# Patient Record
Sex: Female | Born: 1945
Health system: Southern US, Community
[De-identification: ages and names within clinical notes are randomized; demographics above are authoritative.]

## PROBLEM LIST (undated history)

## (undated) DIAGNOSIS — T8859XA Other complications of anesthesia, initial encounter: Secondary | ICD-10-CM

## (undated) DIAGNOSIS — F32A Depression, unspecified: Secondary | ICD-10-CM

## (undated) DIAGNOSIS — K52839 Microscopic colitis, unspecified: Secondary | ICD-10-CM

## (undated) DIAGNOSIS — C801 Malignant (primary) neoplasm, unspecified: Secondary | ICD-10-CM

## (undated) DIAGNOSIS — F329 Major depressive disorder, single episode, unspecified: Secondary | ICD-10-CM

## (undated) DIAGNOSIS — D0502 Lobular carcinoma in situ of left breast: Secondary | ICD-10-CM

## (undated) DIAGNOSIS — F419 Anxiety disorder, unspecified: Secondary | ICD-10-CM

## (undated) DIAGNOSIS — I10 Essential (primary) hypertension: Secondary | ICD-10-CM

## (undated) DIAGNOSIS — T4145XA Adverse effect of unspecified anesthetic, initial encounter: Secondary | ICD-10-CM

## (undated) DIAGNOSIS — M1990 Unspecified osteoarthritis, unspecified site: Secondary | ICD-10-CM

## (undated) DIAGNOSIS — K589 Irritable bowel syndrome without diarrhea: Secondary | ICD-10-CM

## (undated) DIAGNOSIS — E7800 Pure hypercholesterolemia, unspecified: Secondary | ICD-10-CM

## (undated) DIAGNOSIS — C4359 Malignant melanoma of other part of trunk: Secondary | ICD-10-CM

## (undated) DIAGNOSIS — R32 Unspecified urinary incontinence: Secondary | ICD-10-CM

## (undated) HISTORY — DX: Irritable bowel syndrome without diarrhea: K58.9

## (undated) HISTORY — DX: Microscopic colitis, unspecified: K52.839

## (undated) HISTORY — DX: Lobular carcinoma in situ of left breast: D05.02

## (undated) HISTORY — DX: Unspecified urinary incontinence: R32

## (undated) HISTORY — DX: Malignant melanoma of other part of trunk: C43.59

## (undated) HISTORY — PX: TUBAL LIGATION: SHX77

---
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
## 1992-01-14 DIAGNOSIS — C4359 Malignant melanoma of other part of trunk: Secondary | ICD-10-CM

## 1992-01-14 HISTORY — PX: BUNIONECTOMY: SHX129

## 1992-01-14 HISTORY — DX: Malignant melanoma of other part of trunk: C43.59

## 1998-01-13 HISTORY — PX: BREAST LUMPECTOMY: SHX2

## 2000-01-14 HISTORY — PX: ABDOMINAL HYSTERECTOMY: SHX81

## 2000-01-14 HISTORY — PX: OTHER SURGICAL HISTORY: SHX169

## 2001-01-13 HISTORY — PX: KNEE ARTHROSCOPY: SUR90

## 2002-01-13 DIAGNOSIS — C801 Malignant (primary) neoplasm, unspecified: Secondary | ICD-10-CM

## 2002-01-13 HISTORY — DX: Malignant (primary) neoplasm, unspecified: C80.1

## 2002-01-13 HISTORY — PX: BREAST LUMPECTOMY: SHX2

## 2004-01-14 HISTORY — PX: BREAST LUMPECTOMY: SHX2

## 2004-07-11 ENCOUNTER — Ambulatory Visit (HOSPITAL_COMMUNITY): Admission: RE | Admit: 2004-07-11 | Discharge: 2004-07-11 | Payer: Self-pay | Admitting: General Surgery

## 2006-01-13 HISTORY — PX: WRIST SURGERY: SHX841

## 2006-07-22 ENCOUNTER — Ambulatory Visit (HOSPITAL_COMMUNITY): Admission: RE | Admit: 2006-07-22 | Discharge: 2006-07-22 | Payer: Self-pay | Admitting: General Surgery

## 2008-01-14 HISTORY — PX: KNEE ARTHROSCOPY: SUR90

## 2008-01-14 HISTORY — PX: BIOPSY BREAST: PRO8

## 2008-04-26 ENCOUNTER — Ambulatory Visit (HOSPITAL_BASED_OUTPATIENT_CLINIC_OR_DEPARTMENT_OTHER): Admission: RE | Admit: 2008-04-26 | Discharge: 2008-04-26 | Payer: Self-pay | Admitting: Orthopedic Surgery

## 2008-09-08 ENCOUNTER — Encounter: Admission: RE | Admit: 2008-09-08 | Discharge: 2008-09-08 | Payer: Self-pay | Admitting: General Surgery

## 2008-09-08 DIAGNOSIS — D0502 Lobular carcinoma in situ of left breast: Secondary | ICD-10-CM

## 2008-09-08 HISTORY — DX: Lobular carcinoma in situ of left breast: D05.02

## 2009-01-13 HISTORY — PX: EYE SURGERY: SHX253

## 2009-05-03 ENCOUNTER — Ambulatory Visit (HOSPITAL_COMMUNITY): Admission: RE | Admit: 2009-05-03 | Discharge: 2009-05-03 | Payer: Self-pay | Admitting: Ophthalmology

## 2010-01-13 DIAGNOSIS — R32 Unspecified urinary incontinence: Secondary | ICD-10-CM

## 2010-01-13 DIAGNOSIS — E7800 Pure hypercholesterolemia, unspecified: Secondary | ICD-10-CM

## 2010-01-13 HISTORY — DX: Pure hypercholesterolemia, unspecified: E78.00

## 2010-01-13 HISTORY — DX: Unspecified urinary incontinence: R32

## 2010-02-03 ENCOUNTER — Encounter: Payer: Self-pay | Admitting: General Surgery

## 2010-03-14 HISTORY — PX: WRIST SURGERY: SHX841

## 2010-04-02 LAB — BASIC METABOLIC PANEL
Chloride: 109 mEq/L (ref 96–112)
Creatinine, Ser: 0.59 mg/dL (ref 0.4–1.2)
GFR calc non Af Amer: >60 mL/min (ref >60)

## 2010-04-02 LAB — HEMOGLOBIN AND HEMATOCRIT, BLOOD
HCT: 38.1 % (ref 36.0–46.0)
Hemoglobin: 13.6 g/dL (ref 12.0–15.0)

## 2010-04-15 ENCOUNTER — Encounter (HOSPITAL_COMMUNITY): Payer: Medicare Other | Attending: Orthopedic Surgery

## 2010-04-15 ENCOUNTER — Other Ambulatory Visit: Payer: Self-pay | Admitting: Orthopedic Surgery

## 2010-04-15 DIAGNOSIS — Z01812 Encounter for preprocedural laboratory examination: Secondary | ICD-10-CM | POA: Insufficient documentation

## 2010-04-15 DIAGNOSIS — Z79899 Other long term (current) drug therapy: Secondary | ICD-10-CM | POA: Insufficient documentation

## 2010-04-15 LAB — URINALYSIS, ROUTINE W REFLEX MICROSCOPIC
Bilirubin Urine: NEGATIVE
Glucose, UA: NEGATIVE mg/dL
Hgb urine dipstick: NEGATIVE
Protein, ur: NEGATIVE mg/dL
Urobilinogen, UA: 0.2 mg/dL (ref 0.0–1.0)
pH: 5.5 (ref 5.0–8.0)

## 2010-04-15 LAB — COMPREHENSIVE METABOLIC PANEL
ALT: 16 U/L (ref 0–35)
AST: 14 U/L (ref 0–37)
BUN: 20 mg/dL (ref 6–23)
Calcium: 9.5 mg/dL (ref 8.4–10.5)
Chloride: 108 mEq/L (ref 96–112)
Creatinine, Ser: 0.65 mg/dL (ref 0.4–1.2)
Glucose, Bld: 99 mg/dL (ref 70–99)
Sodium: 142 mEq/L (ref 135–145)
Total Bilirubin: 0.5 mg/dL (ref 0.3–1.2)

## 2010-04-15 LAB — PROTIME-INR
INR: 0.91 (ref 0.00–1.49)
Prothrombin Time: 12.5 seconds (ref 11.6–15.2)

## 2010-04-15 LAB — URINE MICROSCOPIC-ADD ON

## 2010-04-15 LAB — CBC
HCT: 41.8 % (ref 36.0–46.0)
Hemoglobin: 13.8 g/dL (ref 12.0–15.0)
MCH: 31.4 pg (ref 26.0–34.0)
MCHC: 33 g/dL (ref 30.0–36.0)

## 2010-04-15 LAB — APTT: aPTT: 28 seconds (ref 24–37)

## 2010-04-22 ENCOUNTER — Inpatient Hospital Stay (HOSPITAL_COMMUNITY): Admission: RE | Admit: 2010-04-22 | Payer: Medicare Other | Source: Ambulatory Visit | Admitting: Orthopedic Surgery

## 2010-04-26 NOTE — H&P (Signed)
NAME:  Sylvia Barnett, Sylvia Barnett            ACCOUNT NO.:  [id number]  MEDICAL RECORD NO.:  [id number]           PATIENT TYPE:  I  LOCATION:  1S                           FACILITY:  [facility]  PHYSICIAN:  Ollen Gross, M.D.    DATE OF BIRTH:  [birth year]  DATE OF ADMISSION:  04/22/2010 DATE OF DISCHARGE:                             HISTORY & PHYSICAL   CHIEF COMPLAINT:  Left greater than right knee pain.  HISTORY OF PRESENT ILLNESS:  The patient is a 65 year old female who has been seen by Dr. Lequita Halt for ongoing knee pain.  She has bilateral knee pain, but the left is more symptomatic and problematic than the right. It has been ongoing for quite some time now.  She has known end-stage arthritis and felt to be a good candidate.  She has been seen preoperatively by Dr. Neita Carp and felt to be stable for the up and coming procedure.  ALLERGIES:  SULFA causes rash.  INTOLERANCES:  BAND-AIDS cause skin irritation, but the patient does not have a known latex allergy.  CURRENT MEDICATIONS:  Atorvastatin, Lipitor, Celexa.  PAST MEDICAL HISTORY: 1. Anxiety. 2. Depression. 3. Hypercholesterolemia. 4. Hemorrhoids. 5. Urinary incontinence. 6. History of urinary tract infections. 7. History of cystitis. 8. History of left-sided in situ breast carcinoma. 9. Degenerative disk disease. 10.Postmenopausal. 11.Childhood illnesses of measles and mumps.  PAST SURGICAL HISTORY: 1. Appendectomy in 1949. 2. Tubal ligation in 1985. 3. Bunion surgery in 1994. 4. Knee surgery in 2003. 5. Colonoscopy with polypectomy in 2002. 6. Left-sided breast cancer surgery in 2000. 7. Bladder tack hysterectomy in 2002. 8. Colonoscopy with polypectomy in 2002. 9. Right leg biopsy in 2010. 10.Right eye cataract surgery in 2011. 11.Left knee surgery in 2010.  FAMILY HISTORY:  Father with cerebrovascular disease.  Mother with lymphoma.  Brother with melanoma.  Sister with lymphoma.  SOCIAL HISTORY:  Married.   Retired.  Nonsmoker.  No alcohol.  Husband will be assisting with care after surgery.  She has three steps entering the front of her house and zero steps entering in from the carport of the house.  REVIEW OF SYSTEMS:  GENERAL:  No fevers, chills or night sweats. NEUROLOGIC:  No seizures, syncope or paralysis.  RESPIRATORY:  No shortness of breath, productive cough or hemoptysis.  CARDIOVASCULAR: No chest pain.  No orthopnea.  GASTROINTESTINAL:  No nausea, vomiting, diarrhea or constipation.  GENITOURINARY:  A little bit of frequency and incontinence.  No dysuria or hematuria.  MUSCULOSKELETAL:  Knee pain.  PHYSICAL EXAMINATION:  VITAL SIGNS:  Pulse 68, respirations 14, blood pressure 160/82. GENERAL:  A 65 year old white female, well-nourished, well-developed. No acute distress.  She is slightly overweight.  Alert, oriented and cooperative. HEENT:  Normocephalic, atraumatic.  Pupils are equal, round and reactive.  EOMs intact. NECK:  Supple. CHEST:  Clear. HEART:  Regular rate and rhythm without murmur.  S1 and S2 noted. ABDOMEN:  Soft, round.  Bowel sounds are present. RECTAL/BREAST/GENITALIA:  Not done as not pertinent to present illness. EXTREMITIES:  Left knee range of motion 7-110.  Marked crepitus.  Tender more medial.  No instability.  Right knee range of  motion 15-120.  No instability.  Tender medial joint line.  IMPRESSION:  Osteoarthritis left knee greater than right knee.  PLAN:  The patient was admitted to [facility] to undergo a left total-knee replacement arthroplasty.  Surgery will be performed by Dr. Ollen Gross.     Alexzandrew L. Julien Girt, P.A.C.   ______________________________ Ollen Gross, M.D.    ALP/MEDQ  D:  04/22/2010  T:  04/22/2010  Job:  [id number]  cc:   Fara Chute Fax: [phone]  Electronically Signed by Patrica Duel P.A.C. on 04/25/2010 11:12:41 AM Electronically Signed by Ollen Gross M.D. on 04/26/2010 09:02:38 AM

## 2010-05-28 NOTE — Op Note (Signed)
NAME:  Sylvia Barnett, Sylvia Barnett            ACCOUNT NO.:  [id number]   MEDICAL RECORD NO.:  [id number]          PATIENT TYPE:  AMB   LOCATION:  NESC                         FACILITY:  [facility]   PHYSICIAN:  Ollen Gross, M.D.    DATE OF BIRTH:  December 20, 1945   DATE OF PROCEDURE:  04/26/2008  DATE OF DISCHARGE:                               OPERATIVE REPORT   PREOPERATIVE DIAGNOSIS:  Left knee lateral meniscal tear.   POSTOPERATIVE DIAGNOSIS:  Left knee lateral meniscal tear.  Medial  meniscal tear and chondral defect.   PROCEDURE:  Left knee arthroscopy with medial and lateral meniscal  debridement and chondroplasty.   SURGEON:  Ollen Gross, M.D.   ASSISTANT:  None.   ANESTHESIA:  General.   ESTIMATED BLOOD LOSS:  Minimal.   DRAINS:  None.   COMPLICATIONS:  None.   CONDITION:  Stable to recovery.   CLINICAL NOTE:  Sylvia Barnett is a 65 year old female with history of  significant left-knee pain and mechanical symptoms.  She had an exam and  history suggestive of meniscal tear.  It was equivocal on MRI.  Given  her persistent symptoms and lack of response to nonoperative  management, it was decided to proceed with arthroscopic debridement.   PROCEDURE IN DETAIL:  After successful administration of general  anesthetic, a tourniquet was placed high on her left thigh and her left  lower extremity was prepped and draped in the usual sterile fashion.  Standard superomedial and inferolateral incisions were made.  Inflow  cannula passed superomedial and camera passed inferolateral.  Arthroscopic visualization proceeds.   Undersurface of patella shows some grade 2 change and the trochlea looks  pretty normal.  Medial and lateral gutters were visualized and there is  no loose body.  Flexion and valgus force applied to the knee and the  medial compartment was entered.  There was a tear in the body and  posterior horn of the medial meniscus and there was also some high-grade  chondromalacia  in the medial femoral condyle.   A spinal needle was used to localize the inferomedial portal, a small  incision made and dilator placed.  The meniscus was debrided back to a  stable base with baskets and a 4.2-mm shaver.  It was sealed off with  the ArthroCare device.  The meniscus was probed and found to be stable.  The cartilage abnormality and surface of the  medial femoral condyle  consisted of about a 5 x 5-mm area of exposed bone and then about a 1 x  1-cm area of grade 3 chondromalacia.  I debrided this back to a stable  cartilaginous base.  There was no other unstable cartilage in the medial  compartment.   The intercondylar notch was visualized.  The ACL and PCL looked normal.  Lateral compartment was entered.  There was a lateral meniscal tear.  It  was debrided back to stable base with baskets and a 4.2-mm shaver.  It  was sealed off with the ArthroCare device.   The joint was further inspected and I saw a very large loose  osteochondral body in the infrapatellar area.  I was able to grasp this  with the pituitary grabber and remove it.  It was a fairly large loose  body and can definitely have been impinging.   The arthroscopic equipment was then removed from the inferior portals  which were closed with interrupted 4-0 nylon.  20 mL of 0.25% Marcaine  with epi were injected through the inflow cannula, then that was removed  that portal closed with nylon.  A bulky sterile dressing was then  applied and the patient was awakened and transported to recovery in  stable condition.      Ollen Gross, M.D.  Electronically Signed     FA/MEDQ  D:  04/26/2008  T:  04/26/2008  Job:  [id number]

## 2010-08-14 HISTORY — PX: JOINT REPLACEMENT: SHX530

## 2010-08-20 ENCOUNTER — Other Ambulatory Visit: Payer: Self-pay | Admitting: Orthopedic Surgery

## 2010-08-20 ENCOUNTER — Encounter (HOSPITAL_COMMUNITY): Payer: Medicare Other

## 2010-08-20 LAB — URINALYSIS, ROUTINE W REFLEX MICROSCOPIC
Bilirubin Urine: NEGATIVE
Glucose, UA: NEGATIVE mg/dL
Hgb urine dipstick: NEGATIVE
Ketones, ur: NEGATIVE mg/dL
Nitrite: POSITIVE — AB
Protein, ur: NEGATIVE mg/dL
Specific Gravity, Urine: 1.025 (ref 1.005–1.030)
Urobilinogen, UA: 0.2 mg/dL (ref 0.0–1.0)
pH: 6 (ref 5.0–8.0)

## 2010-08-20 LAB — COMPREHENSIVE METABOLIC PANEL
AST: 14 U/L (ref 0–37)
BUN: 24 mg/dL — ABNORMAL HIGH (ref 6–23)
Chloride: 108 mEq/L (ref 96–112)
Creatinine, Ser: 0.59 mg/dL (ref 0.50–1.10)
GFR calc Af Amer: >60 mL/min (ref >60)
GFR calc non Af Amer: >60 mL/min (ref >60)
Glucose, Bld: 92 mg/dL (ref 70–99)
Potassium: 3.9 mEq/L (ref 3.5–5.1)
Sodium: 143 mEq/L (ref 135–145)
Total Bilirubin: 0.2 mg/dL — ABNORMAL LOW (ref 0.3–1.2)

## 2010-08-20 LAB — CBC
HCT: 40.2 % (ref 36.0–46.0)
MCH: 31 pg (ref 26.0–34.0)

## 2010-08-20 LAB — SURGICAL PCR SCREEN: MRSA, PCR: NEGATIVE

## 2010-09-02 ENCOUNTER — Inpatient Hospital Stay (HOSPITAL_COMMUNITY)
Admission: RE | Admit: 2010-09-02 | Discharge: 2010-09-05 | DRG: 470 | Disposition: A | Discharge status: Home Health | Payer: Medicare Other | Source: Ambulatory Visit | Attending: Orthopedic Surgery | Admitting: Orthopedic Surgery

## 2010-09-02 DIAGNOSIS — F341 Dysthymic disorder: Secondary | ICD-10-CM | POA: Diagnosis present

## 2010-09-02 DIAGNOSIS — M1710 Unilateral primary osteoarthritis, unspecified knee: Principal | ICD-10-CM | POA: Diagnosis present

## 2010-09-02 DIAGNOSIS — R110 Nausea: Secondary | ICD-10-CM | POA: Diagnosis not present

## 2010-09-02 DIAGNOSIS — Z01812 Encounter for preprocedural laboratory examination: Secondary | ICD-10-CM

## 2010-09-02 LAB — TYPE AND SCREEN: ABO/RH(D): AB POS

## 2010-09-02 LAB — ABO/RH: ABO/RH(D): AB POS

## 2010-09-03 LAB — CBC
HCT: 34.6 % — ABNORMAL LOW (ref 36.0–46.0)
MCH: 30.9 pg (ref 26.0–34.0)
MCHC: 32.9 g/dL (ref 30.0–36.0)
Platelets: 170 10*3/uL (ref 150–400)
RDW: 12.7 % (ref 11.5–15.5)
WBC: 8.2 10*3/uL (ref 4.0–10.5)

## 2010-09-03 LAB — BASIC METABOLIC PANEL
BUN: 7 mg/dL (ref 6–23)
Calcium: 8.8 mg/dL (ref 8.4–10.5)
Chloride: 101 mEq/L (ref 96–112)
Creatinine, Ser: 0.47 mg/dL — ABNORMAL LOW (ref 0.50–1.10)
GFR calc Af Amer: >60 mL/min (ref >60)
GFR calc non Af Amer: >60 mL/min (ref >60)
Sodium: 136 mEq/L (ref 135–145)

## 2010-09-04 LAB — BASIC METABOLIC PANEL
BUN: 5 mg/dL — ABNORMAL LOW (ref 6–23)
Creatinine, Ser: 0.48 mg/dL — ABNORMAL LOW (ref 0.50–1.10)
GFR calc Af Amer: >60 mL/min (ref >60)
GFR calc non Af Amer: >60 mL/min (ref >60)
Potassium: 3.7 mEq/L (ref 3.5–5.1)
Sodium: 140 mEq/L (ref 135–145)

## 2010-09-04 LAB — CBC
MCH: 31.5 pg (ref 26.0–34.0)
MCHC: 33.4 g/dL (ref 30.0–36.0)
Platelets: 186 10*3/uL (ref 150–400)
RDW: 12.8 % (ref 11.5–15.5)
WBC: 9.4 10*3/uL (ref 4.0–10.5)

## 2010-09-05 LAB — CBC
HCT: 33.3 % — ABNORMAL LOW (ref 36.0–46.0)
Hemoglobin: 11.1 g/dL — ABNORMAL LOW (ref 12.0–15.0)
MCH: 31.8 pg (ref 26.0–34.0)
RBC: 3.49 MIL/uL — ABNORMAL LOW (ref 3.87–5.11)
WBC: 10.1 10*3/uL (ref 4.0–10.5)

## 2010-09-12 NOTE — Op Note (Signed)
NAME:  Sylvia Barnett, Sylvia Barnett NO.:  [id number]  MEDICAL RECORD NO.:  [id number]  LOCATION:  1620                         FACILITY:  [facility]  PHYSICIAN:  Ollen Gross, M.D.    DATE OF BIRTH:  07/07/1945  DATE OF PROCEDURE:  09/02/2010 DATE OF DISCHARGE:                              OPERATIVE REPORT   PREOPERATIVE DIAGNOSIS:  Osteoarthritis, left knee.  POSTOPERATIVE DIAGNOSIS:  Osteoarthritis, left knee.  PROCEDURE:  Left total knee arthroplasty.  SURGEON:  Ollen Gross, MD  ASSISTANT:  Alexzandrew L. Perkins, PA-C  ANESTHESIA:  Spinal.  ESTIMATED BLOOD LOSS:  Minimal.  DRAIN:  Hemovac x1.  TOURNIQUET TIME:  32 minutes at 300 mmHg.  COMPLICATIONS:  None.  CONDITION:  Stable to Recovery.  BRIEF CLINICAL NOTE:  Sylvia Barnett is a 65 year old female with advanced end-stage arthritis both knees, left worse than the right.  She has bone on bone degenerative change.  She has failed nonoperative management, including injections and analgesics.  She presents now for left total knee arthroplasty.  PROCEDURE IN DETAIL:  After successful administration of spinal anesthetic, a tourniquet was placed high on her left thigh, and her left lower extremity was prepped and draped in usual sterile fashion. Extremity was wrapped in an Esmarch, knee flexed, tourniquet inflated to 300 mmHg.  Midline incision was made with a 10 blade through subcutaneous tissue to the level of the extensor mechanism.  Fresh blade was used to make a medial parapatellar arthrotomy.  Soft tissue on the proximal medial tibia subperiosteally elevated to the joint line with the knife and into the semimembranosus bursa with a Cobb elevator.  Soft tissue laterally was elevated with attention being paid to avoid patellar tendon on tibial tubercle.  Patella was everted, knee flexed 90 degrees, and ACL and PCL removed.  She has complete exposed bone on bone of the medial and patellofemoral compartments.   Drill was used to create a starting hole in the distal femur.  Canal was thoroughly irrigated.  A 5-degree left valgus alignment guide was placed.  I resected 12 mm off the distal femur because she had about a 15-degree flexion contracture preop.  Distal femoral resection was made with an oscillating saw.  The tibia subluxed forward and the menisci removed.  Extramedullary tibial alignment guide was placed referencing proximally at the medial aspect of the tibial tubercle and distally along the second metatarsal axis and tibial crest.  The block was pinned to remove 2 mm off the more deficient medial side.  Tibial resection was made with an oscillating saw.  Size 3 was the most appropriate tibial component and the proximal tibia was prepared with a modular drill and keel punched for the size 3.  Femoral sizing guide was placed, size 4 narrow was most appropriate. Rotation was marked off the epicondylar axis.  This was confirmed by creating rectangular flexion gap at 90 degrees.  The block was pinned in this rotation and the anterior and posterior chamfer cuts made. Intercondylar block was placed and that cut was made.  Trial size 4 narrow posterior stabilized femur was placed.  A 10 mm posterior stabilized rotating platform insert trial was placed.  There was a little bit  of varus-valgus play with that.  I went to a 12.5 which allowed for full extension with excellent varus-valgus, anterior- posterior stability throughout, full range of motion.  Patella was everted, thickness measured to be 24 mm.  Freehand resection was taken to 14 mm, 38 template was placed, lug holes were drilled, trial patella was placed, and it tracked normally.  Osteophytes removed off the posterior femur with the trial in place.  All trials were removed and the cut bone surfaces repaired with pulsatile lavage.  Cement was mixed and once ready for implantation, the size 3 mobile-bearing tibial tray, size 4 narrow  posterior stabilized femur, and 38 patella were cemented into place and the patella was held with a clamp.  Trial 12.5 mm insert was placed, knee held in full extension, all extruded cement removed. When the cement was fully hardened, then the permanent 12.5 mm posterior stabilized rotating platform insert was placed into the tibial tray. Wound was copiously irrigated with saline solution and the arthrotomy closed over Hemovac drain with interrupted #1 PDS.  Flexion against gravity was 125 degrees.  Patella tracked normally.  She hits her calf to her posterior thigh at 125.  Tourniquet was released total time of 32 minutes.  Subcutaneous was then closed with interrupted 2-0 Vicryl and subcuticular with running 4-0 Monocryl.  Catheter for the Marcaine pain pump was placed and the pump was initiated.  Incision was cleaned and dried and Steri-Strips and a bulky sterile dressing were applied.  She was then placed into a knee immobilizer, awakened, and transported to Recovery in stable condition.     Ollen Gross, M.D.     FA/MEDQ  D:  09/02/2010  T:  09/02/2010  Job:  [id number]  Electronically Signed by Ollen Gross M.D. on 09/12/2010 04:03:38 PM

## 2010-09-18 NOTE — H&P (Addendum)
NAME:  Sylvia Barnett, RAMBEAU            ACCOUNT NO.:  [id number]  MEDICAL RECORD NO.:  [id number]  LOCATION:  PADM                         FACILITY:  [facility]  PHYSICIAN:  Ollen Gross, M.D.    DATE OF BIRTH:  05-07-1945  DATE OF ADMISSION:  09/02/2010 DATE OF DISCHARGE:  09/05/2010                             HISTORY & PHYSICAL   DATE OF ADMISSION:  09/02/2010.  CHIEF COMPLAINT:  Left knee pain.  HISTORY OF PRESENT ILLNESS:  The patient is a 65 year old female who has been seen by Dr. Lequita Halt for ongoing right knee pain.  She has known end- stage arthritis  in both knees and she has undergone injections in the past.  Despite having nonoperative treatment, including injections, the patient continues to have progressive pain.  She has end-stage arthritis, failed nonoperative management including injections, and now presents for total knee arthroplasty.  ALLERGIES: 1. SULFA causes rash. 2. PRILOSEC causes swelling in fingers. 3. Intolerances to Band-Aids which cause skin irritation, but the     patient does not have a known latex allergy. 4. FERNIAL caused her rash.  CURRENT MEDICATIONS:  Lipitor, Celexa, Os-Cal with D, multivitamin.  PAST MEDICAL HISTORY:  Anxiety, depression, hypercholesterolemia, hemorrhoids, urinary incontinence, history of cystitis, history of left- sided in situ breast carcinoma, degenerative disk disease, postmenopausal.  Childhood illnesses of measles and mumps, history of melanoma.  PAST SURGICAL HISTORY:  Appendectomy 1949, tubal ligation 1985, bunion surgery 1994, knee surgery 2003, colonoscopy, polypectomy in 2002, left- sided breast cancer surgery 2000, right leg biopsy 2010, right eye cataract surgery 2011, and left knee surgery 2010.  FAMILY HISTORY:  Father deceased at age 28 with diabetes and heart disease.  Mother deceased at age 25 with lymphoma.  Has a sister with breast cancer, another sister with lymphoma.  A brother with  skin melanoma.  SOCIAL HISTORY:  Married, retired, nonsmoker.  Two children, one daughter and one son.  Husband will be assisting with care after surgery.  She lives in a split-level.  She has 7 steps going up to the upper level and 10 steps going down to the lower level.  REVIEW OF SYSTEMS:  GENERAL:  No fevers, chills or night sweats. NEUROLOGIC:  No seizures, syncope or paralysis.  RESPIRATORY:  No shortness breath, productive cough or hemoptysis.  CARDIOVASCULAR:  No chest pain or orthopnea.  GI:  No nausea, vomiting, diarrhea or constipation.  GU:  Little bit of urinary frequency.  No dysuria or hematuria.  MUSCULOSKELETAL:  Knee pain.  PHYSICAL EXAMINATION:  VITAL SIGNS:  Pulse 72, respirations 14, blood pressure 168/81. GENERAL:  65 year old white female, well-nourished, well-developed, in no acute distress.  She is alert, oriented and cooperative.  Slightly overweight, accompanied by her husband. HEENT:  Normocephalic, atraumatic.  Pupils round and reactive.  EOMs intact. Wears glasses. NECK:  Supple. CHEST:  Clear. HEART:  Regular rate and rhythm without murmur. ABDOMEN:  Soft, nontender, round.  Bowel sounds present. RECTAL, BREASTS, GENITALIA:  Not done.  Not pertinent to present illness. EXTREMITIES:  Left knee tender medially.  Range of motion 7 to 110, marked crepitus, no instability.  IMPRESSION:  Osteoarthritis, left knee.  PLAN:  The patient will be admitted to  [facility], undergo a left total knee replacement arthroplasty.  Surgery will be performed by Dr. Ollen Gross.  There are no active contraindications to surgery such as an infection or rapidly progressive neurological disease.  Dictated For:  Ollen Gross, MD     Alexzandrew L. Julien Girt, P.A.C.   ______________________________ Ollen Gross, M.D.    ALP/MEDQ  D:  09/08/2010  T:  09/08/2010  Job:  [id number]  cc:   Fara Chute, MD Fax: [phone]  Electronically Signed by  Patrica Duel P.A.C. on 09/18/2010 12:39:13 PM Electronically Signed by Ollen Gross M.D. on 09/22/2010 12:16:43 PM

## 2010-09-22 NOTE — Discharge Summary (Signed)
NAME:  Sylvia Barnett, CAREY NO.:  [id number]  MEDICAL RECORD NO.:  [id number]  LOCATION:  1620                         FACILITY:  [facility]  PHYSICIAN:  Ollen Gross, M.D.    DATE OF BIRTH:  Jan 28, 1945  DATE OF ADMISSION:  09/02/2010 DATE OF DISCHARGE:  09/05/2010                              DISCHARGE SUMMARY   ADMITTING DIAGNOSES: 1. Osteoarthritis, left knee. 2. Anxiety. 3. Depression. 4. Hypercholesterolemia. 5. Hemorrhoids. 6. Urinary incontinence. 7. History of cystitis. 8. History of the left-sided in situ breast cancer. 9. Degenerative disk disease. 10.Postmenopause. 11.History of melanoma. 12.Childhood illnesses of measles and mumps.  DISCHARGE DIAGNOSES: 1. Osteoarthritis, left knee status; post left total knee replacement     arthroplasty. 2. Anxiety. 3. Depression. 4. Hypercholesterolemia. 5. Hemorrhoids. 6. Urinary incontinence. 7. History of cystitis. 8. History of the left-sided in situ breast cancer. 9. Degenerative disk disease. 10.Postmenopause. 11.History of melanoma. 12.Childhood illnesses of measles and mumps.  PROCEDURE:  Left total knee.  SURGEON:  Ollen Gross, M.D.  ASSISTANT:  Alexzandrew L. Perkins, P.A.C.  ANESTHESIA:  Spinal.  TOURNIQUET TIME:  32 minutes.  CONSULTS:  None.  BRIEF HISTORY:  Ms. Sylvia Barnett is a 65 year old female with advanced endstage arthritis of both knees, left is more symptomatic and problematic than right.  She has bone-on-bone changes, failed nonoperative management including injections and analgesics, now presents for surgical intervention.  LABORATORY DATA:  The admission CBC is not scanned into the computer chart but her starting hemoglobin was 13.2.  Serial CBCs were followed. Hemoglobin dropped down at 11.4, then 10.4 back up, and last H and H were 11.1 and 33.3.  Admission chemistry panel was not scanned into the computer chart.  Serial BMETs were followed for 48 hours.   Electrolytes remained within normal limits.  Glucose was 154 on postop day #1 and back down to 139 on postop day #2.  AB positive was her blood group type.  HOSPITAL COURSE:  The patient admitted to [facility], taken to OR, underwent above-stated procedure without complication.  The patient tolerated the procedure well, later was transferred to recovery room on to orthopedic floor, started on p.o. and IV analgesic pain control following surgery, given 24 hours postop IV antibiotics.  She was started on Xarelto for DVT prophylaxis, started getting up out of bed on day #1, had a little bit of nausea on the evening of surgery that was better on postop day #1.  Hemovac drain was pulled.  She was started back on her home meds.  She had good urinary output on postop day #1. By day #2, we changed her dressing, incision looked good.  The Foley was removed.  Hemoglobin was stable.  She had a little bit elevated temperature on the evening of day #1 till the morning of day #2, but we encouraged incentive spirometer and antipyretics.  Temperature came down.  Continued to work with her therapy and by day #3, she was doing better.  She was meeting her goals.  She had finished with occupational therapy and physical therapy, and she was discharged home.  DISCHARGE/PLAN: 1. The patient was discharged home on September 05, 2010. 2. Discharge diagnoses, please see above. 3.  Discharge meds; OxyIR, Robaxin, Xarelto, continue home meds,     arthritis topical cream/Aspercreme, Celexa, Lipitor, and     multivitamin.  DIET:  Heart-healthy diet.  ACTIVITY:  She is weightbearing as tolerated.  Total knee protocol. Home health PT.  FOLLOWUP:  2 weeks.  DISPOSITION:  Home.  CONDITION UPON DISCHARGE:  Improved.     Alexzandrew L. Julien Girt, P.A.C.   ______________________________ Ollen Gross, M.D.    ALP/MEDQ  D:  09/18/2010  T:  09/18/2010  Job:  [id number]  cc:   Fara Chute, MD Fax:  [phone]  Electronically Signed by Patrica Duel P.A.C. on 09/19/2010 08:14:40 AM Electronically Signed by Ollen Gross M.D. on 09/22/2010 12:16:46 PM

## 2011-01-14 DIAGNOSIS — I10 Essential (primary) hypertension: Secondary | ICD-10-CM

## 2011-01-14 DIAGNOSIS — M1990 Unspecified osteoarthritis, unspecified site: Secondary | ICD-10-CM

## 2011-01-14 HISTORY — DX: Essential (primary) hypertension: I10

## 2011-01-14 HISTORY — DX: Unspecified osteoarthritis, unspecified site: M19.90

## 2011-01-23 DIAGNOSIS — J069 Acute upper respiratory infection, unspecified: Secondary | ICD-10-CM | POA: Diagnosis not present

## 2011-02-05 DIAGNOSIS — M47817 Spondylosis without myelopathy or radiculopathy, lumbosacral region: Secondary | ICD-10-CM | POA: Diagnosis not present

## 2011-02-06 DIAGNOSIS — N3946 Mixed incontinence: Secondary | ICD-10-CM | POA: Diagnosis not present

## 2011-02-06 DIAGNOSIS — R350 Frequency of micturition: Secondary | ICD-10-CM | POA: Diagnosis not present

## 2011-02-06 DIAGNOSIS — N3941 Urge incontinence: Secondary | ICD-10-CM | POA: Diagnosis not present

## 2011-02-13 DIAGNOSIS — R350 Frequency of micturition: Secondary | ICD-10-CM | POA: Diagnosis not present

## 2011-02-13 DIAGNOSIS — N3941 Urge incontinence: Secondary | ICD-10-CM | POA: Diagnosis not present

## 2011-02-17 DIAGNOSIS — M47817 Spondylosis without myelopathy or radiculopathy, lumbosacral region: Secondary | ICD-10-CM | POA: Diagnosis not present

## 2011-02-20 DIAGNOSIS — R350 Frequency of micturition: Secondary | ICD-10-CM | POA: Diagnosis not present

## 2011-02-20 DIAGNOSIS — N3941 Urge incontinence: Secondary | ICD-10-CM | POA: Diagnosis not present

## 2011-02-27 DIAGNOSIS — N3941 Urge incontinence: Secondary | ICD-10-CM | POA: Diagnosis not present

## 2011-02-27 DIAGNOSIS — R350 Frequency of micturition: Secondary | ICD-10-CM | POA: Diagnosis not present

## 2011-03-04 DIAGNOSIS — M5137 Other intervertebral disc degeneration, lumbosacral region: Secondary | ICD-10-CM | POA: Diagnosis not present

## 2011-03-06 DIAGNOSIS — R350 Frequency of micturition: Secondary | ICD-10-CM | POA: Diagnosis not present

## 2011-03-06 DIAGNOSIS — N3941 Urge incontinence: Secondary | ICD-10-CM | POA: Diagnosis not present

## 2011-03-11 DIAGNOSIS — M47817 Spondylosis without myelopathy or radiculopathy, lumbosacral region: Secondary | ICD-10-CM | POA: Diagnosis not present

## 2011-03-13 DIAGNOSIS — N3946 Mixed incontinence: Secondary | ICD-10-CM | POA: Diagnosis not present

## 2011-03-13 DIAGNOSIS — R350 Frequency of micturition: Secondary | ICD-10-CM | POA: Diagnosis not present

## 2011-03-13 DIAGNOSIS — N3941 Urge incontinence: Secondary | ICD-10-CM | POA: Diagnosis not present

## 2011-03-17 DIAGNOSIS — I10 Essential (primary) hypertension: Secondary | ICD-10-CM | POA: Diagnosis not present

## 2011-03-17 DIAGNOSIS — E7800 Pure hypercholesterolemia, unspecified: Secondary | ICD-10-CM | POA: Diagnosis not present

## 2011-03-18 DIAGNOSIS — M1710 Unilateral primary osteoarthritis, unspecified knee: Secondary | ICD-10-CM | POA: Diagnosis not present

## 2011-03-18 DIAGNOSIS — M76899 Other specified enthesopathies of unspecified lower limb, excluding foot: Secondary | ICD-10-CM | POA: Diagnosis not present

## 2011-03-20 DIAGNOSIS — N3941 Urge incontinence: Secondary | ICD-10-CM | POA: Diagnosis not present

## 2011-03-24 DIAGNOSIS — E7800 Pure hypercholesterolemia, unspecified: Secondary | ICD-10-CM | POA: Diagnosis not present

## 2011-03-24 DIAGNOSIS — Z0000 Encounter for general adult medical examination without abnormal findings: Secondary | ICD-10-CM | POA: Diagnosis not present

## 2011-03-24 DIAGNOSIS — I10 Essential (primary) hypertension: Secondary | ICD-10-CM | POA: Diagnosis not present

## 2011-03-24 DIAGNOSIS — F411 Generalized anxiety disorder: Secondary | ICD-10-CM | POA: Diagnosis not present

## 2011-03-24 DIAGNOSIS — M1990 Unspecified osteoarthritis, unspecified site: Secondary | ICD-10-CM | POA: Diagnosis not present

## 2011-03-24 DIAGNOSIS — IMO0002 Reserved for concepts with insufficient information to code with codable children: Secondary | ICD-10-CM | POA: Diagnosis not present

## 2011-03-27 DIAGNOSIS — N3941 Urge incontinence: Secondary | ICD-10-CM | POA: Diagnosis not present

## 2011-03-27 DIAGNOSIS — R350 Frequency of micturition: Secondary | ICD-10-CM | POA: Diagnosis not present

## 2011-03-31 DIAGNOSIS — M5137 Other intervertebral disc degeneration, lumbosacral region: Secondary | ICD-10-CM | POA: Diagnosis not present

## 2011-03-31 DIAGNOSIS — M5030 Other cervical disc degeneration, unspecified cervical region: Secondary | ICD-10-CM | POA: Diagnosis not present

## 2011-04-03 DIAGNOSIS — R350 Frequency of micturition: Secondary | ICD-10-CM | POA: Diagnosis not present

## 2011-04-03 DIAGNOSIS — N3941 Urge incontinence: Secondary | ICD-10-CM | POA: Diagnosis not present

## 2011-04-10 DIAGNOSIS — N3941 Urge incontinence: Secondary | ICD-10-CM | POA: Diagnosis not present

## 2011-04-10 DIAGNOSIS — R350 Frequency of micturition: Secondary | ICD-10-CM | POA: Diagnosis not present

## 2011-04-17 DIAGNOSIS — R350 Frequency of micturition: Secondary | ICD-10-CM | POA: Diagnosis not present

## 2011-04-17 DIAGNOSIS — N3941 Urge incontinence: Secondary | ICD-10-CM | POA: Diagnosis not present

## 2011-04-24 DIAGNOSIS — N3941 Urge incontinence: Secondary | ICD-10-CM | POA: Diagnosis not present

## 2011-04-24 DIAGNOSIS — R350 Frequency of micturition: Secondary | ICD-10-CM | POA: Diagnosis not present

## 2011-05-01 DIAGNOSIS — N3941 Urge incontinence: Secondary | ICD-10-CM | POA: Diagnosis not present

## 2011-05-01 DIAGNOSIS — N3946 Mixed incontinence: Secondary | ICD-10-CM | POA: Diagnosis not present

## 2011-05-01 DIAGNOSIS — R350 Frequency of micturition: Secondary | ICD-10-CM | POA: Diagnosis not present

## 2011-05-13 DIAGNOSIS — N6489 Other specified disorders of breast: Secondary | ICD-10-CM | POA: Diagnosis not present

## 2011-05-13 DIAGNOSIS — C50919 Malignant neoplasm of unspecified site of unspecified female breast: Secondary | ICD-10-CM | POA: Diagnosis not present

## 2011-05-20 DIAGNOSIS — Z09 Encounter for follow-up examination after completed treatment for conditions other than malignant neoplasm: Secondary | ICD-10-CM | POA: Diagnosis not present

## 2011-05-20 DIAGNOSIS — Z79899 Other long term (current) drug therapy: Secondary | ICD-10-CM | POA: Diagnosis not present

## 2011-05-20 DIAGNOSIS — Z9889 Other specified postprocedural states: Secondary | ICD-10-CM | POA: Diagnosis not present

## 2011-05-20 DIAGNOSIS — Z791 Long term (current) use of non-steroidal anti-inflammatories (NSAID): Secondary | ICD-10-CM | POA: Diagnosis not present

## 2011-05-20 DIAGNOSIS — Z87898 Personal history of other specified conditions: Secondary | ICD-10-CM | POA: Diagnosis not present

## 2011-05-21 DIAGNOSIS — M47817 Spondylosis without myelopathy or radiculopathy, lumbosacral region: Secondary | ICD-10-CM | POA: Diagnosis not present

## 2011-05-22 DIAGNOSIS — N3941 Urge incontinence: Secondary | ICD-10-CM | POA: Diagnosis not present

## 2011-06-12 DIAGNOSIS — R350 Frequency of micturition: Secondary | ICD-10-CM | POA: Diagnosis not present

## 2011-06-12 DIAGNOSIS — H2510 Age-related nuclear cataract, unspecified eye: Secondary | ICD-10-CM | POA: Diagnosis not present

## 2011-06-12 DIAGNOSIS — H524 Presbyopia: Secondary | ICD-10-CM | POA: Diagnosis not present

## 2011-06-12 DIAGNOSIS — N3941 Urge incontinence: Secondary | ICD-10-CM | POA: Diagnosis not present

## 2011-06-12 DIAGNOSIS — Z961 Presence of intraocular lens: Secondary | ICD-10-CM | POA: Diagnosis not present

## 2011-06-12 DIAGNOSIS — H35379 Puckering of macula, unspecified eye: Secondary | ICD-10-CM | POA: Diagnosis not present

## 2011-07-03 DIAGNOSIS — N3941 Urge incontinence: Secondary | ICD-10-CM | POA: Diagnosis not present

## 2011-07-03 DIAGNOSIS — R350 Frequency of micturition: Secondary | ICD-10-CM | POA: Diagnosis not present

## 2011-07-08 DIAGNOSIS — H35379 Puckering of macula, unspecified eye: Secondary | ICD-10-CM | POA: Diagnosis not present

## 2011-07-08 DIAGNOSIS — H26499 Other secondary cataract, unspecified eye: Secondary | ICD-10-CM | POA: Diagnosis not present

## 2011-07-08 DIAGNOSIS — H2510 Age-related nuclear cataract, unspecified eye: Secondary | ICD-10-CM | POA: Diagnosis not present

## 2011-07-14 DIAGNOSIS — E7800 Pure hypercholesterolemia, unspecified: Secondary | ICD-10-CM | POA: Diagnosis not present

## 2011-07-14 DIAGNOSIS — I10 Essential (primary) hypertension: Secondary | ICD-10-CM | POA: Diagnosis not present

## 2011-07-21 DIAGNOSIS — M1990 Unspecified osteoarthritis, unspecified site: Secondary | ICD-10-CM | POA: Diagnosis not present

## 2011-07-21 DIAGNOSIS — F411 Generalized anxiety disorder: Secondary | ICD-10-CM | POA: Diagnosis not present

## 2011-07-21 DIAGNOSIS — M25559 Pain in unspecified hip: Secondary | ICD-10-CM | POA: Diagnosis not present

## 2011-07-21 DIAGNOSIS — E7800 Pure hypercholesterolemia, unspecified: Secondary | ICD-10-CM | POA: Diagnosis not present

## 2011-07-21 DIAGNOSIS — M5430 Sciatica, unspecified side: Secondary | ICD-10-CM | POA: Diagnosis not present

## 2011-07-21 DIAGNOSIS — M169 Osteoarthritis of hip, unspecified: Secondary | ICD-10-CM | POA: Diagnosis not present

## 2011-07-21 DIAGNOSIS — R7309 Other abnormal glucose: Secondary | ICD-10-CM | POA: Diagnosis not present

## 2011-07-21 DIAGNOSIS — I10 Essential (primary) hypertension: Secondary | ICD-10-CM | POA: Diagnosis not present

## 2011-07-24 DIAGNOSIS — R350 Frequency of micturition: Secondary | ICD-10-CM | POA: Diagnosis not present

## 2011-07-24 DIAGNOSIS — N3941 Urge incontinence: Secondary | ICD-10-CM | POA: Diagnosis not present

## 2011-07-25 DIAGNOSIS — M545 Low back pain, unspecified: Secondary | ICD-10-CM | POA: Diagnosis not present

## 2011-07-25 DIAGNOSIS — Z85820 Personal history of malignant melanoma of skin: Secondary | ICD-10-CM | POA: Diagnosis not present

## 2011-07-25 DIAGNOSIS — M47817 Spondylosis without myelopathy or radiculopathy, lumbosacral region: Secondary | ICD-10-CM | POA: Diagnosis not present

## 2011-07-25 DIAGNOSIS — Z853 Personal history of malignant neoplasm of breast: Secondary | ICD-10-CM | POA: Diagnosis not present

## 2011-07-25 DIAGNOSIS — M5430 Sciatica, unspecified side: Secondary | ICD-10-CM | POA: Diagnosis not present

## 2011-07-25 DIAGNOSIS — M5126 Other intervertebral disc displacement, lumbar region: Secondary | ICD-10-CM | POA: Diagnosis not present

## 2011-08-14 DIAGNOSIS — N3941 Urge incontinence: Secondary | ICD-10-CM | POA: Diagnosis not present

## 2011-08-14 DIAGNOSIS — R350 Frequency of micturition: Secondary | ICD-10-CM | POA: Diagnosis not present

## 2011-08-14 HISTORY — PX: EYE SURGERY: SHX253

## 2011-08-18 DIAGNOSIS — M1710 Unilateral primary osteoarthritis, unspecified knee: Secondary | ICD-10-CM | POA: Diagnosis not present

## 2011-08-18 DIAGNOSIS — IMO0002 Reserved for concepts with insufficient information to code with codable children: Secondary | ICD-10-CM | POA: Diagnosis not present

## 2011-09-04 DIAGNOSIS — N3941 Urge incontinence: Secondary | ICD-10-CM | POA: Diagnosis not present

## 2011-09-04 DIAGNOSIS — R350 Frequency of micturition: Secondary | ICD-10-CM | POA: Diagnosis not present

## 2011-09-09 DIAGNOSIS — M1710 Unilateral primary osteoarthritis, unspecified knee: Secondary | ICD-10-CM | POA: Diagnosis not present

## 2011-09-25 DIAGNOSIS — N3941 Urge incontinence: Secondary | ICD-10-CM | POA: Diagnosis not present

## 2011-10-07 DIAGNOSIS — M1710 Unilateral primary osteoarthritis, unspecified knee: Secondary | ICD-10-CM | POA: Diagnosis not present

## 2011-10-15 ENCOUNTER — Other Ambulatory Visit: Payer: Self-pay | Admitting: Orthopedic Surgery

## 2011-10-15 MED ORDER — DEXAMETHASONE SODIUM PHOSPHATE 10 MG/ML IJ SOLN: 10.0000 mg | Freq: Once | INTRAMUSCULAR | Status: DC

## 2011-10-15 MED ORDER — BUPIVACAINE 0.25 % ON-Q PUMP SINGLE CATH 300ML: 300.0000 mL | INJECTION | Status: DC

## 2011-10-15 NOTE — Progress Notes (Signed)
Preoperative surgical orders have been place into the Epic hospital system for Kenyon Ana on 10/15/2011, 4:08 PM  by Patrica Duel for surgery on 11/03/11.  Preop Total Knee orders including Bupivacaine On-Q pump, IV Tylenol, and IV Decadron as long as there are no contraindications to the above medications. Avel Peace, PA-C

## 2011-10-22 DIAGNOSIS — R350 Frequency of micturition: Secondary | ICD-10-CM | POA: Diagnosis not present

## 2011-10-22 DIAGNOSIS — N3941 Urge incontinence: Secondary | ICD-10-CM | POA: Diagnosis not present

## 2011-10-23 ENCOUNTER — Encounter (HOSPITAL_COMMUNITY): Payer: Self-pay | Admitting: Pharmacy Technician

## 2011-10-28 ENCOUNTER — Encounter (HOSPITAL_COMMUNITY)
Admission: RE | Admit: 2011-10-28 | Discharge: 2011-10-28 | Disposition: A | Discharge status: Home / Self Care | Payer: Medicare Other | Source: Ambulatory Visit | Attending: Orthopedic Surgery | Admitting: Orthopedic Surgery

## 2011-10-28 ENCOUNTER — Encounter (HOSPITAL_COMMUNITY): Payer: Self-pay

## 2011-10-28 ENCOUNTER — Ambulatory Visit (HOSPITAL_COMMUNITY)
Admission: RE | Admit: 2011-10-28 | Discharge: 2011-10-28 | Disposition: A | Discharge status: Home / Self Care | Payer: Medicare Other | Source: Ambulatory Visit | Attending: Orthopedic Surgery | Admitting: Orthopedic Surgery

## 2011-10-28 DIAGNOSIS — I700 Atherosclerosis of aorta: Secondary | ICD-10-CM | POA: Insufficient documentation

## 2011-10-28 DIAGNOSIS — Z01810 Encounter for preprocedural cardiovascular examination: Secondary | ICD-10-CM | POA: Insufficient documentation

## 2011-10-28 DIAGNOSIS — Z01818 Encounter for other preprocedural examination: Secondary | ICD-10-CM | POA: Diagnosis not present

## 2011-10-28 DIAGNOSIS — I517 Cardiomegaly: Secondary | ICD-10-CM | POA: Diagnosis not present

## 2011-10-28 DIAGNOSIS — R9431 Abnormal electrocardiogram [ECG] [EKG]: Secondary | ICD-10-CM | POA: Diagnosis not present

## 2011-10-28 DIAGNOSIS — Z01812 Encounter for preprocedural laboratory examination: Secondary | ICD-10-CM | POA: Diagnosis not present

## 2011-10-28 HISTORY — DX: Malignant (primary) neoplasm, unspecified: C80.1

## 2011-10-28 HISTORY — DX: Adverse effect of unspecified anesthetic, initial encounter: T41.45XA

## 2011-10-28 HISTORY — DX: Anxiety disorder, unspecified: F41.9

## 2011-10-28 HISTORY — DX: Unspecified osteoarthritis, unspecified site: M19.90

## 2011-10-28 HISTORY — DX: Depression, unspecified: F32.A

## 2011-10-28 HISTORY — DX: Essential (primary) hypertension: I10

## 2011-10-28 HISTORY — DX: Major depressive disorder, single episode, unspecified: F32.9

## 2011-10-28 HISTORY — DX: Other complications of anesthesia, initial encounter: T88.59XA

## 2011-10-28 LAB — CBC
MCV: 91.4 fL (ref 78.0–100.0)
Platelets: 198 10*3/uL (ref 150–400)
RBC: 4.56 MIL/uL (ref 3.87–5.11)
RDW: 12.9 % (ref 11.5–15.5)
WBC: 6.9 10*3/uL (ref 4.0–10.5)

## 2011-10-28 LAB — COMPREHENSIVE METABOLIC PANEL
ALT: 16 U/L (ref 0–35)
AST: 14 U/L (ref 0–37)
Albumin: 3.7 g/dL (ref 3.5–5.2)
Alkaline Phosphatase: 89 U/L (ref 39–117)
CO2: 31 mEq/L (ref 19–32)
Chloride: 104 mEq/L (ref 96–112)
GFR calc non Af Amer: 84 mL/min — ABNORMAL LOW (ref >90)
Potassium: 3.8 mEq/L (ref 3.5–5.1)
Sodium: 141 mEq/L (ref 135–145)
Total Bilirubin: 0.3 mg/dL (ref 0.3–1.2)

## 2011-10-28 LAB — APTT: aPTT: 30 seconds (ref 24–37)

## 2011-10-28 LAB — URINALYSIS, ROUTINE W REFLEX MICROSCOPIC
Bilirubin Urine: NEGATIVE
Glucose, UA: NEGATIVE mg/dL
Ketones, ur: NEGATIVE mg/dL
Specific Gravity, Urine: 1.02 (ref 1.005–1.030)
pH: 6 (ref 5.0–8.0)

## 2011-10-28 LAB — URINE MICROSCOPIC-ADD ON

## 2011-10-28 NOTE — Patient Instructions (Addendum)
[address]  10/28/2011   Your procedure is scheduled on:  Monday 11-03-2011  Report to [facility] at  AM. 1125  Call this number if you have problems the morning of surgery: [phone]   Remember:   Do not eat food:After Midnight.  Sunday 11-02-2011  May have clear liquids:until  0755 Monday morning 11-03-2011  Then nothing by mouth until after surgery.  Clear liquids include soda, tea, black coffee, apple or white grape juice, broth.  Take these medicines the morning of surgery with A SIP OF WATER:  Pain medication Tramadol    Do not wear jewelry, make-up or nail polish.  Do not wear lotions, powders, or perfumes. You may wear deodorant.  Do not shave 48 hours prior to surgery. Men may shave face and neck.  Do not bring valuables to the hospital.  Contacts, dentures or bridgework may not be worn into surgery.  Leave suitcase in the car. After surgery it may be brought to your room.  For patients admitted to the hospital, checkout time is 11:00 AM the day of discharge.   Patients discharged the day of surgery will not be allowed to drive home.  Name and phone number of your driver:   Special Instructions: CHG Shower Use Special Wash: 1/2 bottle night before surgery and 1/2 bottle morning of surgery.   Please read over the following fact sheets that you were given: MRSA Information, Blood transfusion information, Incentive spirometry handout

## 2011-10-28 NOTE — Progress Notes (Signed)
1705 Faxed abnormal urine and urine microscopic to Dr. Deri Fuelling  office for review for surgery 11-03-2011.

## 2011-10-29 LAB — SURGICAL PCR SCREEN: MRSA, PCR: NEGATIVE

## 2011-10-29 NOTE — Progress Notes (Signed)
1150 Called in Cipro 500 mg Bid x 3 days for abnormal urine 10-28-11 Dr. Lequita Halt

## 2011-11-02 ENCOUNTER — Other Ambulatory Visit: Payer: Self-pay | Admitting: Orthopedic Surgery

## 2011-11-02 NOTE — H&P (Signed)
Sylvia Barnett  DOB: 04/23/1945 Married / Language: English / Race: White Female  Date of Admission:  11/03/11  Chief Complaint:  Right Knee Pain  History of Present Illness The patient is a 66 year old female who comes in today for a preoperative History and Physical. The patient is scheduled for a right total knee arthroplasty to be performed by Dr. Frank V. Aluisio, MD at [facility] on 11/03/11. She has had the left knee replaced back in April of 2012. The left knee is doing very well at this time. She is not having any issues with the left knee. The right knee is getting progressively worse. She has documented endstage arthritis. Unfortunately, the pain and dysfunction are worsening. She is at a stage now where she feels like she needs to do something with the right knee. She has had injections in the past which are no longer beneficial. She is ready to get the other knee done. They have been treated conservatively in the past for the above stated problem and despite conservative measures, they continue to have progressive pain and severe functional limitations and dysfunction. They have failed non-operative management including home exercise, medications, and injections. It is felt that they would benefit from undergoing total joint replacement. Risks and benefits of the procedure have been discussed with the patient and they elect to proceed with surgery. There are no active contraindications to surgery such as ongoing infection or rapidly progressive neurological disease.   Problem List/Past Medical S/P Left total knee arthroplasty (V43.65) Osteoarthritis, knee (715.96) Lumbar disc degeneration (722.52)   Allergies SULFA DRUGS. 04/23/2010 PRILOSEC. 04/23/2010 Latex. Red blistereing (Can usr paper tape and steri-strips)   Family History Rheumatoid Arthritis. grandmother mothers side Hypertension. mother and father Cerebrovascular Accident.  grandmother mothers side Cancer. sister and brother Heart Disease. father Diabetes Mellitus. father   Social History Exercise. Exercises weekly; does gym / weights Children. 2 Illicit drug use. no Alcohol use. Never consumed alcohol. never consumed alcohol Living situation. Lives with spouse. live with spouse Pain Contract. no Tobacco / smoke exposure. no Previously in rehab. no Number of flights of stairs before winded. 1 Drug/Alcohol Rehab (Currently). no Tobacco use. Never smoker. never smoker Marital status. Married. married Current work status. retired Post-Surgical Plans. Plan is to go home.   Medication History Hydrochlorothiazide (25MG Tablet, Oral) Active. Ultram (50MG Tablet, 1 Oral four times daily, as needed, Taken starting 03/31/2011) Active. Atorvastatin Calcium (20MG Tablet, Oral) Active. Methocarbamol (500MG Tablet, Oral) Active. Myrbetriq (25MG Tablet ER 24HR, Oral) Active. Citalopram Hydrobromide (20MG Tablet, Oral) Active.   Past Surgical History Other Surgery. left/right wrist fx surg, bladder tact, bunion right foot - 1994 Hysterectomy. Date: 2002. complete (non-cancerous) Total Knee Replacement. Date: 08/2010. left Tubal Ligation. Date: 1985. Arthroscopy of Knee. bilateral; Right - 2003, Left - 2010 Appendectomy. Date: 1949. Breast Biopsy. Date: 04/2009. left Gallbladder Surgery. laporoscopic Breast Mass; Local Excision. Date: 2000. left Colon Polyp Removal - Colonoscopy. Date: 2002. Cataract Extraction-Right. Date: 04/2010. Wrist Surgery. Bilateral; Left - 04/2010, Right - 2008  Medical History Anxiety Disorder Breast Cancer Hypercholesterolemia Depression Osteoarthritis Hypertension Hemorrhoids Urinary Incontinence Menopause Breast cancer. Left-sided Measles Mumps Cystitis   Review of Systems General:Not Present- Chills, Fever, Night Sweats, Fatigue, Weight Gain, Weight Loss and Memory  Loss. Skin:Not Present- Hives, Itching, Rash, Eczema and Lesions. HEENT:Not Present- Tinnitus, Headache, Double Vision, Visual Loss, Hearing Loss and Dentures. Respiratory:Not Present- Shortness of breath with exertion, Shortness of breath at rest, Allergies, Coughing up blood   and Chronic Cough. Cardiovascular:Not Present- Chest Pain, Racing/skipping heartbeats, Difficulty Breathing Lying Down, Murmur, Swelling and Palpitations. Gastrointestinal:Not Present- Bloody Stool, Heartburn, Abdominal Pain, Vomiting, Nausea, Constipation, Diarrhea, Difficulty Swallowing, Jaundice and Loss of appetitie. Female Genitourinary:Present- Urinary frequency and Incontinence. Not Present- Blood in Urine, Weak urinary stream, Discharge, Flank Pain, Painful Urination, Urgency, Urinary Retention and Urinating at Night. Musculoskeletal:Present- Joint Pain, Back Pain and Morning Stiffness. Not Present- Muscle Weakness, Muscle Pain, Joint Swelling and Spasms. Neurological:Not Present- Tremor, Dizziness, Blackout spells, Paralysis, Difficulty with balance and Weakness. Psychiatric:Not Present- Insomnia.   Vitals Height: 64 in Pulse: 68 (Regular) Resp.: 14 (Unlabored) BP: 158/86 (Sitting, Right Arm, Standard)    Physical Exam The physical exam findings are as follows:  Patient is a 66 year old female with continued right knee pain.   General Mental Status - Alert, cooperative and good historian. General Appearance- pleasant. Not in acute distress. Orientation- Oriented X3. Build & Nutrition- Well nourished and Well developed.   Head and Neck Head- normocephalic, atraumatic . Neck Global Assessment- supple. no bruit auscultated on the right and no bruit auscultated on the left.   Eye Pupil- Bilateral- Regular and Round. Motion- Bilateral- EOMI.   Note: wears glasses  Chest and Lung Exam Auscultation: Breath sounds:- clear at anterior chest wall and - clear at  posterior chest wall. Adventitious sounds:- No Adventitious sounds.   Cardiovascular Auscultation:Rhythm- Regular rate and rhythm. Heart Sounds- S1 WNL and S2 WNL. Murmurs & Other Heart Sounds:Auscultation of the heart reveals - No Murmurs.   Abdomen Inspection:Contour- Generalized mild distention. Palpation/Percussion:Tenderness- Abdomen is non-tender to palpation. Rigidity (guarding)- Abdomen is soft. Auscultation:Auscultation of the abdomen reveals - Bowel sounds normal.   Female Genitourinary Not done, not pertinent to present illness  Musculoskeletal  Right knee, no effusion. Range of motion is about 10 to 95 degrees. She has significant varus. There is marked crepitus on range of motion. She is tender medial and lateral. There is no instability noted. Pulses, sensation and motor are intact in both lower extremities.  RADIOGRAPHS: AP and lateral of the left knee taken today show the prosthesis to be in excellent position with no periprosthetic abnormalities.  Most recent films of the right knee a couple of months ago show that she has severe endstage bone-on-bone arthritis medial and patellofemoral compartments.  Assessment & Plan S/P Left total knee arthroplasty (V43.65) Osteoarthritis Right Knee  Note: Patient is for a Right Total Knee Replacement by Dr. Aluisio.  Plan is to go home.  PCP - Dr. Pual Sasser  Signed electronically by DREW L PERKINS, PA-C  

## 2011-11-03 ENCOUNTER — Encounter (HOSPITAL_COMMUNITY): Payer: Self-pay | Admitting: *Deleted

## 2011-11-03 ENCOUNTER — Encounter (HOSPITAL_COMMUNITY)
Admission: RE | Disposition: A | Discharge status: Home Health | Payer: Self-pay | Source: Ambulatory Visit | Attending: Orthopedic Surgery

## 2011-11-03 ENCOUNTER — Inpatient Hospital Stay (HOSPITAL_COMMUNITY): Payer: Medicare Other | Admitting: Anesthesiology

## 2011-11-03 ENCOUNTER — Encounter (HOSPITAL_COMMUNITY): Payer: Self-pay | Admitting: Anesthesiology

## 2011-11-03 ENCOUNTER — Inpatient Hospital Stay (HOSPITAL_COMMUNITY)
Admission: RE | Admit: 2011-11-03 | Discharge: 2011-11-05 | DRG: 470 | Disposition: A | Discharge status: Home Health | Payer: Medicare Other | Source: Ambulatory Visit | Attending: Orthopedic Surgery | Admitting: Orthopedic Surgery

## 2011-11-03 DIAGNOSIS — F329 Major depressive disorder, single episode, unspecified: Secondary | ICD-10-CM | POA: Diagnosis present

## 2011-11-03 DIAGNOSIS — Z8719 Personal history of other diseases of the digestive system: Secondary | ICD-10-CM

## 2011-11-03 DIAGNOSIS — M1710 Unilateral primary osteoarthritis, unspecified knee: Principal | ICD-10-CM | POA: Diagnosis present

## 2011-11-03 DIAGNOSIS — M5137 Other intervertebral disc degeneration, lumbosacral region: Secondary | ICD-10-CM | POA: Diagnosis present

## 2011-11-03 DIAGNOSIS — Z96659 Presence of unspecified artificial knee joint: Secondary | ICD-10-CM | POA: Diagnosis not present

## 2011-11-03 DIAGNOSIS — Z79899 Other long term (current) drug therapy: Secondary | ICD-10-CM

## 2011-11-03 DIAGNOSIS — I10 Essential (primary) hypertension: Secondary | ICD-10-CM | POA: Diagnosis not present

## 2011-11-03 DIAGNOSIS — Z9089 Acquired absence of other organs: Secondary | ICD-10-CM | POA: Diagnosis not present

## 2011-11-03 DIAGNOSIS — E7800 Pure hypercholesterolemia, unspecified: Secondary | ICD-10-CM | POA: Diagnosis present

## 2011-11-03 DIAGNOSIS — Z90710 Acquired absence of both cervix and uterus: Secondary | ICD-10-CM

## 2011-11-03 DIAGNOSIS — Z853 Personal history of malignant neoplasm of breast: Secondary | ICD-10-CM | POA: Diagnosis not present

## 2011-11-03 DIAGNOSIS — F411 Generalized anxiety disorder: Secondary | ICD-10-CM | POA: Diagnosis present

## 2011-11-03 DIAGNOSIS — Z91040 Latex allergy status: Secondary | ICD-10-CM | POA: Diagnosis not present

## 2011-11-03 DIAGNOSIS — Z888 Allergy status to other drugs, medicaments and biological substances status: Secondary | ICD-10-CM | POA: Diagnosis not present

## 2011-11-03 DIAGNOSIS — Z882 Allergy status to sulfonamides status: Secondary | ICD-10-CM

## 2011-11-03 DIAGNOSIS — M179 Osteoarthritis of knee, unspecified: Secondary | ICD-10-CM | POA: Diagnosis present

## 2011-11-03 DIAGNOSIS — D62 Acute posthemorrhagic anemia: Secondary | ICD-10-CM | POA: Diagnosis not present

## 2011-11-03 DIAGNOSIS — F3289 Other specified depressive episodes: Secondary | ICD-10-CM | POA: Diagnosis present

## 2011-11-03 DIAGNOSIS — M51379 Other intervertebral disc degeneration, lumbosacral region without mention of lumbar back pain or lower extremity pain: Secondary | ICD-10-CM | POA: Diagnosis present

## 2011-11-03 HISTORY — PX: TOTAL KNEE ARTHROPLASTY: SHX125

## 2011-11-03 LAB — TYPE AND SCREEN
ABO/RH(D): AB POS
Antibody Screen: NEGATIVE

## 2011-11-03 SURGERY — ARTHROPLASTY, KNEE, TOTAL
Anesthesia: Spinal | Site: Knee | Laterality: Right | Wound class: Clean

## 2011-11-03 MED ORDER — ONDANSETRON HCL 4 MG/2ML IJ SOLN: 4.0000 mg | Freq: Four times a day (QID) | INTRAMUSCULAR | Status: DC | PRN

## 2011-11-03 MED ORDER — ACETAMINOPHEN 10 MG/ML IV SOLN
1000.0000 mg | Freq: Four times a day (QID) | INTRAVENOUS | Status: AC
  Administered 2011-11-03 – 2011-11-04 (×4): 1000 mg via INTRAVENOUS
  Filled 2011-11-03 (×6): qty 100

## 2011-11-03 MED ORDER — METHOCARBAMOL 100 MG/ML IJ SOLN
500.0000 mg | Freq: Four times a day (QID) | INTRAVENOUS | Status: DC | PRN
  Administered 2011-11-03 – 2011-11-04 (×3): 500 mg via INTRAVENOUS
  Filled 2011-11-03 (×3): qty 5

## 2011-11-03 MED ORDER — SODIUM CHLORIDE 0.9 % IR SOLN
Status: DC | PRN
  Administered 2011-11-03: 1000 mL

## 2011-11-03 MED ORDER — DEXAMETHASONE SODIUM PHOSPHATE 4 MG/ML IJ SOLN
INTRAMUSCULAR | Status: DC | PRN
  Administered 2011-11-03: 10 mg via INTRAVENOUS

## 2011-11-03 MED ORDER — CEFAZOLIN SODIUM-DEXTROSE 2-3 GM-% IV SOLR
2.0000 g | Freq: Four times a day (QID) | INTRAVENOUS | Status: AC
  Administered 2011-11-03 – 2011-11-04 (×2): 2 g via INTRAVENOUS
  Filled 2011-11-03 (×2): qty 50

## 2011-11-03 MED ORDER — RIVAROXABAN 10 MG PO TABS
10.0000 mg | ORAL_TABLET | Freq: Every day | ORAL | Status: DC
  Administered 2011-11-04 – 2011-11-05 (×2): 10 mg via ORAL
  Filled 2011-11-03 (×3): qty 1

## 2011-11-03 MED ORDER — DEXAMETHASONE SODIUM PHOSPHATE 10 MG/ML IJ SOLN
10.0000 mg | Freq: Once | INTRAMUSCULAR | Status: AC
  Administered 2011-11-04: 10 mg via INTRAVENOUS
  Filled 2011-11-03: qty 1

## 2011-11-03 MED ORDER — ACETAMINOPHEN 10 MG/ML IV SOLN
1000.0000 mg | Freq: Once | INTRAVENOUS | Status: AC
  Administered 2011-11-03: 1000 mg via INTRAVENOUS

## 2011-11-03 MED ORDER — LACTATED RINGERS IV SOLN
INTRAVENOUS | Status: DC | PRN
  Administered 2011-11-03 (×2): via INTRAVENOUS

## 2011-11-03 MED ORDER — PROPOFOL 10 MG/ML IV EMUL
INTRAVENOUS | Status: DC | PRN
  Administered 2011-11-03: 50 ug/kg/min via INTRAVENOUS

## 2011-11-03 MED ORDER — TRAMADOL HCL 50 MG PO TABS: 50.0000 mg | ORAL_TABLET | Freq: Four times a day (QID) | ORAL | Status: DC | PRN

## 2011-11-03 MED ORDER — LACTATED RINGERS IV SOLN: INTRAVENOUS | Status: DC

## 2011-11-03 MED ORDER — MIDAZOLAM HCL 5 MG/5ML IJ SOLN
INTRAMUSCULAR | Status: DC | PRN
  Administered 2011-11-03: 2 mg via INTRAVENOUS

## 2011-11-03 MED ORDER — PROMETHAZINE HCL 25 MG/ML IJ SOLN: 6.2500 mg | INTRAMUSCULAR | Status: DC | PRN

## 2011-11-03 MED ORDER — BUPIVACAINE ON-Q PAIN PUMP (FOR ORDER SET NO CHG)
INJECTION | Status: DC
  Filled 2011-11-03: qty 1

## 2011-11-03 MED ORDER — ONDANSETRON HCL 4 MG/2ML IJ SOLN
INTRAMUSCULAR | Status: DC | PRN
  Administered 2011-11-03: 4 mg via INTRAVENOUS

## 2011-11-03 MED ORDER — ATORVASTATIN CALCIUM 10 MG PO TABS
10.0000 mg | ORAL_TABLET | Freq: Every day | ORAL | Status: DC
  Administered 2011-11-03 – 2011-11-04 (×2): 10 mg via ORAL
  Filled 2011-11-03 (×3): qty 1

## 2011-11-03 MED ORDER — SODIUM CHLORIDE 0.9 % IV SOLN: INTRAVENOUS | Status: DC

## 2011-11-03 MED ORDER — CITALOPRAM HYDROBROMIDE 10 MG PO TABS
10.0000 mg | ORAL_TABLET | Freq: Every day | ORAL | Status: DC
Start: 2011-11-03 — End: 2011-11-05
  Administered 2011-11-04 – 2011-11-05 (×2): 10 mg via ORAL
  Filled 2011-11-03 (×3): qty 1

## 2011-11-03 MED ORDER — MORPHINE SULFATE 2 MG/ML IJ SOLN
1.0000 mg | INTRAMUSCULAR | Status: DC | PRN
  Administered 2011-11-03 – 2011-11-04 (×3): 2 mg via INTRAVENOUS
  Filled 2011-11-03 (×3): qty 1

## 2011-11-03 MED ORDER — ONDANSETRON HCL 4 MG PO TABS: 4.0000 mg | ORAL_TABLET | Freq: Four times a day (QID) | ORAL | Status: DC | PRN

## 2011-11-03 MED ORDER — CHLORHEXIDINE GLUCONATE 4 % EX LIQD
60.0000 mL | Freq: Once | CUTANEOUS | Status: DC
  Filled 2011-11-03: qty 60

## 2011-11-03 MED ORDER — OXYCODONE HCL 5 MG PO TABS
5.0000 mg | ORAL_TABLET | ORAL | Status: DC | PRN
  Administered 2011-11-03 (×3): 5 mg via ORAL
  Administered 2011-11-04 – 2011-11-05 (×8): 10 mg via ORAL
  Filled 2011-11-03: qty 1
  Filled 2011-11-03 (×7): qty 2
  Filled 2011-11-03: qty 1
  Filled 2011-11-03 (×2): qty 2

## 2011-11-03 MED ORDER — DOCUSATE SODIUM 100 MG PO CAPS
100.0000 mg | ORAL_CAPSULE | Freq: Two times a day (BID) | ORAL | Status: DC
  Administered 2011-11-03 – 2011-11-05 (×4): 100 mg via ORAL

## 2011-11-03 MED ORDER — METOCLOPRAMIDE HCL 10 MG PO TABS: 5.0000 mg | ORAL_TABLET | Freq: Three times a day (TID) | ORAL | Status: DC | PRN

## 2011-11-03 MED ORDER — BUPIVACAINE 0.25 % ON-Q PUMP SINGLE CATH 300ML
INJECTION | Status: DC | PRN
  Administered 2011-11-03: 300 mL

## 2011-11-03 MED ORDER — MIRABEGRON ER 25 MG PO TB24
25.0000 mg | ORAL_TABLET | Freq: Every morning | ORAL | Status: DC
  Administered 2011-11-04 – 2011-11-05 (×2): 25 mg via ORAL
  Filled 2011-11-03 (×3): qty 1

## 2011-11-03 MED ORDER — METHOCARBAMOL 500 MG PO TABS
500.0000 mg | ORAL_TABLET | Freq: Four times a day (QID) | ORAL | Status: DC | PRN
  Administered 2011-11-04: 500 mg via ORAL
  Filled 2011-11-03: qty 1

## 2011-11-03 MED ORDER — ACETAMINOPHEN 325 MG PO TABS: 650.0000 mg | ORAL_TABLET | Freq: Four times a day (QID) | ORAL | Status: DC | PRN

## 2011-11-03 MED ORDER — POLYETHYLENE GLYCOL 3350 17 G PO PACK: 17.0000 g | PACK | Freq: Every day | ORAL | Status: DC | PRN

## 2011-11-03 MED ORDER — HYDROMORPHONE HCL PF 1 MG/ML IJ SOLN: 0.2500 mg | INTRAMUSCULAR | Status: DC | PRN

## 2011-11-03 MED ORDER — ACETAMINOPHEN 650 MG RE SUPP: 650.0000 mg | Freq: Four times a day (QID) | RECTAL | Status: DC | PRN

## 2011-11-03 MED ORDER — DEXAMETHASONE 6 MG PO TABS
10.0000 mg | ORAL_TABLET | Freq: Once | ORAL | Status: AC
  Filled 2011-11-03: qty 1

## 2011-11-03 MED ORDER — DIPHENHYDRAMINE HCL 12.5 MG/5ML PO ELIX: 12.5000 mg | ORAL_SOLUTION | ORAL | Status: DC | PRN

## 2011-11-03 MED ORDER — CEFAZOLIN SODIUM-DEXTROSE 2-3 GM-% IV SOLR
2.0000 g | INTRAVENOUS | Status: AC
  Administered 2011-11-03: 2 g via INTRAVENOUS

## 2011-11-03 MED ORDER — HYDROCHLOROTHIAZIDE 25 MG PO TABS
25.0000 mg | ORAL_TABLET | Freq: Every morning | ORAL | Status: DC
  Administered 2011-11-04: 25 mg via ORAL
  Filled 2011-11-03 (×2): qty 1

## 2011-11-03 MED ORDER — BUPIVACAINE IN DEXTROSE 0.75-8.25 % IT SOLN
INTRATHECAL | Status: DC | PRN
  Administered 2011-11-03: 1.7 mL via INTRATHECAL

## 2011-11-03 MED ORDER — PHENOL 1.4 % MT LIQD: 1.0000 | OROMUCOSAL | Status: DC | PRN

## 2011-11-03 MED ORDER — FLEET ENEMA 7-19 GM/118ML RE ENEM: 1.0000 | ENEMA | Freq: Once | RECTAL | Status: AC | PRN

## 2011-11-03 MED ORDER — MEPERIDINE HCL 50 MG/ML IJ SOLN: 6.2500 mg | INTRAMUSCULAR | Status: DC | PRN

## 2011-11-03 MED ORDER — 0.9 % SODIUM CHLORIDE (POUR BTL) OPTIME
TOPICAL | Status: DC | PRN
  Administered 2011-11-03: 1000 mL

## 2011-11-03 MED ORDER — BISACODYL 10 MG RE SUPP: 10.0000 mg | Freq: Every day | RECTAL | Status: DC | PRN

## 2011-11-03 MED ORDER — MENTHOL 3 MG MT LOZG: 1.0000 | LOZENGE | OROMUCOSAL | Status: DC | PRN

## 2011-11-03 MED ORDER — METOCLOPRAMIDE HCL 5 MG/ML IJ SOLN: 5.0000 mg | Freq: Three times a day (TID) | INTRAMUSCULAR | Status: DC | PRN

## 2011-11-03 SURGICAL SUPPLY — 54 items
BAG ZIPLOCK 12X15 (MISCELLANEOUS) ×2 IMPLANT
BANDAGE ELASTIC 6 VELCRO ST LF (GAUZE/BANDAGES/DRESSINGS) ×2 IMPLANT
BANDAGE ESMARK 6X9 LF (GAUZE/BANDAGES/DRESSINGS) ×1 IMPLANT
BLADE SAG 18X100X1.27 (BLADE) ×2 IMPLANT
BLADE SAW SGTL 11.0X1.19X90.0M (BLADE) ×2 IMPLANT
BNDG COHESIVE 6X5 TAN STRL LF (GAUZE/BANDAGES/DRESSINGS) ×2 IMPLANT
BNDG ESMARK 6X9 LF (GAUZE/BANDAGES/DRESSINGS) ×2
BOWL SMART MIX CTS (DISPOSABLE) ×2 IMPLANT
CATH FOLEY LATEX FREE 16FR (CATHETERS) ×2 IMPLANT
CATH KIT ON-Q SILVERSOAK 5IN (CATHETERS) ×2 IMPLANT
CEMENT HV SMART SET (Cement) ×4 IMPLANT
CLOTH BEACON ORANGE TIMEOUT ST (SAFETY) ×2 IMPLANT
CLSR STERI-STRIP ANTIMIC 1/2X4 (GAUZE/BANDAGES/DRESSINGS) ×2 IMPLANT
CUFF TOURN SGL QUICK 34 (TOURNIQUET CUFF) ×1
CUFF TRNQT CYL 34X4X40X1 (TOURNIQUET CUFF) ×1 IMPLANT
DRAPE EXTREMITY T 121X128X90 (DRAPE) ×2 IMPLANT
DRAPE POUCH INSTRU U-SHP 10X18 (DRAPES) ×2 IMPLANT
DRAPE U-SHAPE 47X51 STRL (DRAPES) ×2 IMPLANT
DRSG ADAPTIC 3X8 NADH LF (GAUZE/BANDAGES/DRESSINGS) ×2 IMPLANT
DRSG EMULSION OIL 3X16 NADH (GAUZE/BANDAGES/DRESSINGS) ×2 IMPLANT
DRSG PAD ABDOMINAL 8X10 ST (GAUZE/BANDAGES/DRESSINGS) ×2 IMPLANT
DURAPREP 26ML APPLICATOR (WOUND CARE) ×2 IMPLANT
ELECT REM PT RETURN 9FT ADLT (ELECTROSURGICAL) ×2
ELECTRODE REM PT RTRN 9FT ADLT (ELECTROSURGICAL) ×1 IMPLANT
EVACUATOR 1/8 PVC DRAIN (DRAIN) ×2 IMPLANT
FACESHIELD LNG OPTICON STERILE (SAFETY) ×10 IMPLANT
GLOVE BIO SURGEON STRL SZ8 (GLOVE) ×2 IMPLANT
GLOVE BIOGEL PI IND STRL 8 (GLOVE) ×2 IMPLANT
GLOVE BIOGEL PI INDICATOR 8 (GLOVE) ×2
GLOVE ECLIPSE 8.0 STRL XLNG CF (GLOVE) ×2 IMPLANT
GLOVE SURG SS PI 6.5 STRL IVOR (GLOVE) ×4 IMPLANT
GOWN STRL NON-REIN LRG LVL3 (GOWN DISPOSABLE) ×4 IMPLANT
GOWN STRL REIN XL XLG (GOWN DISPOSABLE) ×2 IMPLANT
HANDPIECE INTERPULSE COAX TIP (DISPOSABLE) ×1
IMMOBILIZER KNEE 20 (SOFTGOODS) ×2
IMMOBILIZER KNEE 20 THIGH 36 (SOFTGOODS) ×1 IMPLANT
KIT BASIN OR (CUSTOM PROCEDURE TRAY) ×2 IMPLANT
MANIFOLD NEPTUNE II (INSTRUMENTS) ×2 IMPLANT
NS IRRIG 1000ML POUR BTL (IV SOLUTION) ×2 IMPLANT
PACK TOTAL JOINT (CUSTOM PROCEDURE TRAY) ×2 IMPLANT
PAD ABD 7.5X8 STRL (GAUZE/BANDAGES/DRESSINGS) ×2 IMPLANT
PADDING CAST COTTON 6X4 STRL (CAST SUPPLIES) ×2 IMPLANT
POSITIONER SURGICAL ARM (MISCELLANEOUS) ×2 IMPLANT
SET HNDPC FAN SPRY TIP SCT (DISPOSABLE) ×1 IMPLANT
SPONGE GAUZE 4X4 12PLY (GAUZE/BANDAGES/DRESSINGS) ×2 IMPLANT
STRIP CLOSURE SKIN 1/2X4 (GAUZE/BANDAGES/DRESSINGS) ×4 IMPLANT
SUCTION FRAZIER 12FR DISP (SUCTIONS) ×2 IMPLANT
SUT MNCRL AB 4-0 PS2 18 (SUTURE) ×2 IMPLANT
SUT VIC AB 2-0 CT1 27 (SUTURE) ×3
SUT VIC AB 2-0 CT1 TAPERPNT 27 (SUTURE) ×3 IMPLANT
SUT VLOC 180 0 24IN GS25 (SUTURE) ×2 IMPLANT
TOWEL OR 17X26 10 PK STRL BLUE (TOWEL DISPOSABLE) ×4 IMPLANT
WATER STERILE IRR 1500ML POUR (IV SOLUTION) ×2 IMPLANT
WRAP KNEE MAXI GEL POST OP (GAUZE/BANDAGES/DRESSINGS) ×4 IMPLANT

## 2011-11-03 NOTE — Interval H&P Note (Signed)
History and Physical Interval Note:  11/03/2011 12:50 PM  Sylvia Barnett  has presented today for surgery, with the diagnosis of Osteoarthritis of the Right Knee  The various methods of treatment have been discussed with the patient and family. After consideration of risks, benefits and other options for treatment, the patient has consented to  Procedure(s) (LRB) with comments: TOTAL KNEE ARTHROPLASTY (Right) as a surgical intervention .  The patient's history has been reviewed, patient examined, no change in status, stable for surgery.  I have reviewed the patient's chart and labs.  Questions were answered to the patient's satisfaction.     Loanne Drilling

## 2011-11-03 NOTE — Progress Notes (Signed)
Utilization review completed.  

## 2011-11-03 NOTE — Anesthesia Procedure Notes (Signed)
Spinal  Patient location during procedure: OR Staffing Anesthesiologist: Kwaku Mostafa Performed by: anesthesiologist  Preanesthetic Checklist Completed: patient identified, site marked, surgical consent, pre-op evaluation, timeout performed, IV checked, risks and benefits discussed and monitors and equipment checked Spinal Block Patient position: sitting Prep: Betadine Patient monitoring: heart rate, continuous pulse ox and blood pressure Approach: right paramedian Location: L3-4 Injection technique: single-shot Needle Needle type: Spinocan  Needle gauge: 22 G Needle length: 9 cm Additional Notes Expiration date of kit checked and confirmed. Patient tolerated procedure well, without complications.     

## 2011-11-03 NOTE — Transfer of Care (Signed)
Immediate Anesthesia Transfer of Care Note  Patient: Sylvia Barnett  Procedure(s) Performed: Procedure(s) (LRB) with comments: TOTAL KNEE ARTHROPLASTY (Right)  Patient Location: PACU  Anesthesia Type: Regional  Level of Consciousness: awake, alert  and oriented  Airway & Oxygen Therapy: Patient Spontanous Breathing and Patient connected to face mask oxygen  Post-op Assessment: Report given to PACU RN and Post -op Vital signs reviewed and stable  Post vital signs: Reviewed and stable  Complications: No apparent anesthesia complications

## 2011-11-03 NOTE — Op Note (Signed)
Pre-operative diagnosis- Osteoarthritis  Right knee(s)  Post-operative diagnosis- Osteoarthritis Right knee(s)  Procedure-  Right  Total Knee Arthroplasty  Surgeon- Gus Rankin. Ines Warf, MD  Assistant- Leilani Able, PA-C   Anesthesia-  Spinal EBL-* No blood loss amount entered *  Drains Hemovac  Tourniquet time- 34 minutes @ 300 mmHg Complications- None  Condition-PACU - hemodynamically stable.   Brief Clinical Note  Sylvia Barnett is a 66 y.o. year old female with end stage OA of her right knee with progressively worsening pain and dysfunction. She has constant pain, with activity and at rest and significant functional deficits with difficulties even with ADLs. She has had extensive non-op management including analgesics, injections of cortisone and viscosupplements, and home exercise program, but remains in significant pain with significant dysfunction.Radiographs show bone on bone arthritis medial and patellofemoral with large osteophytes. She presents now for right Total Knee Arthroplasty.    Procedure in detail---   The patient is brought into the operating room and positioned supine on the operating table. After successful administration of  Spinal,   a tourniquet is placed high on the  Right thigh(s) and the lower extremity is prepped and draped in the usual sterile fashion. Time out is performed by the operating team and then the  Right lower extremity is wrapped in Esmarch, knee flexed and the tourniquet inflated to 300 mmHg.       A midline incision is made with a ten blade through the subcutaneous tissue to the level of the extensor mechanism. A fresh blade is used to make a medial parapatellar arthrotomy. Soft tissue over the proximal medial tibia is subperiosteally elevated to the joint line with a knife and into the semimembranosus bursa with a Cobb elevator. Soft tissue over the proximal lateral tibia is elevated with attention being paid to avoiding the patellar tendon on the  tibial tubercle. The patella is everted, knee flexed 90 degrees and the ACL and PCL are removed. Findings are bone on bone medial and patellofemoral with massive osteophytes and loose bodies.        The drill is used to create a starting hole in the distal femur and the canal is thoroughly irrigated with sterile saline to remove the fatty contents. The 5 degree Right  valgus alignment guide is placed into the femoral canal and the distal femoral cutting block is pinned to remove 11 mm off the distal femur. Resection is made with an oscillating saw.      The tibia is subluxed forward and the menisci are removed. The extramedullary alignment guide is placed referencing proximally at the medial aspect of the tibial tubercle and distally along the second metatarsal axis and tibial crest. The block is pinned to remove 2mm off the more deficient medial  side. Resection is made with an oscillating saw. Size 3is the most appropriate size for the tibia and the proximal tibia is prepared with the modular drill and keel punch for that size.      The femoral sizing guide is placed and size 4 narrow is most appropriate. Rotation is marked off the epicondylar axis and confirmed by creating a rectangular flexion gap at 90 degrees. The size 4 cutting block is pinned in this rotation and the anterior, posterior and chamfer cuts are made with the oscillating saw. The intercondylar block is then placed and that cut is made.      Trial size 3 tibial component, trial size 4 narrow posterior stabilized femur and a 12.5  mm  posterior stabilized rotating platform insert trial is placed. Full extension is achieved with excellent varus/valgus and anterior/posterior balance throughout full range of motion. The patella is everted and thickness measured to be 25  mm. Free hand resection is taken to 15 mm, a 38 template is placed, lug holes are drilled, trial patella is placed, and it tracks normally. Osteophytes are removed off the  posterior femur with the trial in place. All trials are removed and the cut bone surfaces prepared with pulsatile lavage. Cement is mixed and once ready for implantation, the size 3 tibial implant, size  4 narrow posterior stabilized femoral component, and the size 38 patella are cemented in place and the patella is held with the clamp. The trial insert is placed and the knee held in full extension. All extruded cement is removed and once the cement is hard the permanent 12.5 mm posterior stabilized rotating platform insert is placed into the tibial tray.      The wound is copiously irrigated with saline solution and the extensor mechanism closed over a hemovac drain with #1 PDS suture. The tourniquet is released for a total tourniquet time of 34  minutes. Flexion against gravity is 135 degrees and the patella tracks normally. Subcutaneous tissue is closed with 2.0 vicryl and subcuticular with running 4.0 Monocryl. The catheter for the Marcaine pain pump is placed and the pump is initiated. The incision is cleaned and dried and steri-strips and a bulky sterile dressing are applied. The limb is placed into a knee immobilizer and the patient is awakened and transported to recovery in stable condition.      Please note that a surgical assistant was a medical necessity for this procedure in order to perform it in a safe and expeditious manner. Surgical assistant was necessary to retract the ligaments and vital neurovascular structures to prevent injury to them and also necessary for proper positioning of the limb to allow for anatomic placement of the prosthesis.   Gus Rankin Harrison Paulson, MD    11/03/2011, 2:05 PM

## 2011-11-03 NOTE — H&P (View-Only) (Signed)
Sylvia Barnett  DOB: 08/28/1945 Married / Language: English / Race: White Female  Date of Admission:  11/03/11  Chief Complaint:  Right Knee Pain  History of Present Illness The patient is a 66 year old female who comes in today for a preoperative History and Physical. The patient is scheduled for a right total knee arthroplasty to be performed by Dr. Gus Rankin. Aluisio, MD at [facility] on 11/03/11. She has had the left knee replaced back in April of 2012. The left knee is doing very well at this time. She is not having any issues with the left knee. The right knee is getting progressively worse. She has documented endstage arthritis. Unfortunately, the pain and dysfunction are worsening. She is at a stage now where she feels like she needs to do something with the right knee. She has had injections in the past which are no longer beneficial. She is ready to get the other knee done. They have been treated conservatively in the past for the above stated problem and despite conservative measures, they continue to have progressive pain and severe functional limitations and dysfunction. They have failed non-operative management including home exercise, medications, and injections. It is felt that they would benefit from undergoing total joint replacement. Risks and benefits of the procedure have been discussed with the patient and they elect to proceed with surgery. There are no active contraindications to surgery such as ongoing infection or rapidly progressive neurological disease.   Problem List/Past Medical S/P Left total knee arthroplasty (V43.65) Osteoarthritis, knee (715.96) Lumbar disc degeneration (722.52)   Allergies SULFA DRUGS. 04/23/2010 PRILOSEC. 04/23/2010 Latex. Red blistereing (Can usr paper tape and steri-strips)   Family History Rheumatoid Arthritis. grandmother mothers side Hypertension. mother and father Cerebrovascular Accident.  grandmother mothers side Cancer. sister and brother Heart Disease. father Diabetes Mellitus. father   Social History Exercise. Exercises weekly; does gym / weights Children. 2 Illicit drug use. no Alcohol use. Never consumed alcohol. never consumed alcohol Living situation. Lives with spouse. live with spouse Pain Contract. no Tobacco / smoke exposure. no Previously in rehab. no Number of flights of stairs before winded. 1 Drug/Alcohol Rehab (Currently). no Tobacco use. Never smoker. never smoker Marital status. Married. 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   Medication History Hydrochlorothiazide (25MG  Tablet, Oral) Active. Ultram (50MG  Tablet, 1 Oral four times daily, as needed, Taken starting 03/31/2011) Active. Atorvastatin Calcium (20MG  Tablet, Oral) Active. Methocarbamol (500MG  Tablet, Oral) Active. Myrbetriq (25MG  Tablet ER 24HR, Oral) Active. Citalopram Hydrobromide (20MG  Tablet, Oral) Active.   Past Surgical History Other Surgery. left/right wrist fx surg, bladder tact, bunion right foot - 1994 Hysterectomy. Date: 2002. complete (non-cancerous) Total Knee Replacement. Date: 08/2010. left Tubal Ligation. Date: 71. Arthroscopy of Knee. bilateral; Right - 2003, Left - 2010 Appendectomy. Date: 49. Breast Biopsy. Date: 04/2009. left Gallbladder Surgery. laporoscopic Breast Mass; Local Excision. Date: 2000. left Colon Polyp Removal - Colonoscopy. Date: 2002. Cataract Extraction-Right. Date: 04/2010. Wrist Surgery. Bilateral; Left - 04/2010, Right - 2008  Medical History Anxiety Disorder Breast Cancer Hypercholesterolemia Depression Osteoarthritis Hypertension Hemorrhoids Urinary Incontinence Menopause Breast cancer. Left-sided Measles Mumps Cystitis   Review of Systems General:Not Present- Chills, Fever, Night Sweats, Fatigue, Weight Gain, Weight Loss and Memory  Loss. Skin:Not Present- Hives, Itching, Rash, Eczema and Lesions. HEENT:Not Present- Tinnitus, Headache, Double Vision, Visual Loss, Hearing Loss and Dentures. Respiratory:Not Present- Shortness of breath with exertion, Shortness of breath at rest, Allergies, Coughing up blood  and Chronic Cough. Cardiovascular:Not Present- Chest Pain, Racing/skipping heartbeats, Difficulty Breathing Lying Down, Murmur, Swelling and Palpitations. Gastrointestinal:Not Present- Bloody Stool, Heartburn, Abdominal Pain, Vomiting, Nausea, Constipation, Diarrhea, Difficulty Swallowing, Jaundice and Loss of appetitie. Female Genitourinary:Present- Urinary frequency and Incontinence. Not Present- Blood in Urine, Weak urinary stream, Discharge, Flank Pain, Painful Urination, Urgency, Urinary Retention and Urinating at Night. Musculoskeletal:Present- Joint Pain, Back Pain and Morning Stiffness. Not Present- Muscle Weakness, Muscle Pain, Joint Swelling and Spasms. Neurological:Not Present- Tremor, Dizziness, Blackout spells, Paralysis, Difficulty with balance and Weakness. Psychiatric:Not Present- Insomnia.   Vitals Height: 64 in Pulse: 68 (Regular) Resp.: 14 (Unlabored) BP: 158/86 (Sitting, Right Arm, Standard)    Physical Exam The physical exam findings are as follows:  Patient is a 66 year old female with continued right knee pain.   General Mental Status - Alert, cooperative and good historian. General Appearance- pleasant. Not in acute distress. Orientation- Oriented X3. Build & Nutrition- Well nourished and Well developed.   Head and Neck Head- normocephalic, atraumatic . Neck Global Assessment- supple. no bruit auscultated on the right and no bruit auscultated on the left.   Eye Pupil- Bilateral- Regular and Round. Motion- Bilateral- EOMI.   Note: wears glasses  Chest and Lung Exam Auscultation: Breath sounds:- clear at anterior chest wall and - clear at  posterior chest wall. Adventitious sounds:- No Adventitious sounds.   Cardiovascular Auscultation:Rhythm- Regular rate and rhythm. Heart Sounds- S1 WNL and S2 WNL. Murmurs & Other Heart Sounds:Auscultation of the heart reveals - No Murmurs.   Abdomen Inspection:Contour- Generalized mild distention. Palpation/Percussion:Tenderness- Abdomen is non-tender to palpation. Rigidity (guarding)- Abdomen is soft. Auscultation:Auscultation of the abdomen reveals - Bowel sounds normal.   Female Genitourinary Not done, not pertinent to present illness  Musculoskeletal  Right knee, no effusion. Range of motion is about 10 to 95 degrees. She has significant varus. There is marked crepitus on range of motion. She is tender medial and lateral. There is no instability noted. Pulses, sensation and motor are intact in both lower extremities.  RADIOGRAPHS: AP and lateral of the left knee taken today show the prosthesis to be in excellent position with no periprosthetic abnormalities.  Most recent films of the right knee a couple of months ago show that she has severe endstage bone-on-bone arthritis medial and patellofemoral compartments.  Assessment & Plan S/P Left total knee arthroplasty (V43.65) Osteoarthritis Right Knee  Note: Patient is for a Right Total Knee Replacement by Dr. Lequita Halt.  Plan is to go home.  PCP - Dr. Nolberto Hanlon  Signed electronically by Roberts Gaudy, PA-C

## 2011-11-03 NOTE — Anesthesia Preprocedure Evaluation (Signed)
Anesthesia Evaluation  Patient identified by MRN, date of birth, ID band Patient awake    Reviewed: Allergy & Precautions, H&P , NPO status , Patient's Chart, lab work & pertinent test results  Airway Mallampati: III TM Distance: >3 FB Neck ROM: Full    Dental No notable dental hx.    Pulmonary neg pulmonary ROS,  breath sounds clear to auscultation  Pulmonary exam normal       Cardiovascular hypertension, Pt. on medications negative cardio ROS  Rhythm:Regular Rate:Normal     Neuro/Psych PSYCHIATRIC DISORDERS Anxiety Depression negative neurological ROS  negative psych ROS   GI/Hepatic negative GI ROS, Neg liver ROS,   Endo/Other  negative endocrine ROS  Renal/GU negative Renal ROS  negative genitourinary   Musculoskeletal negative musculoskeletal ROS (+)   Abdominal   Peds negative pediatric ROS (+)  Hematology negative hematology ROS (+)   Anesthesia Other Findings   Reproductive/Obstetrics negative OB ROS                           Anesthesia Physical Anesthesia Plan  ASA: II  Anesthesia Plan: Spinal   Post-op Pain Management:    Induction:   Airway Management Planned: Simple Face Mask  Additional Equipment:   Intra-op Plan:   Post-operative Plan:   Informed Consent: I have reviewed the patients History and Physical, chart, labs and discussed the procedure including the risks, benefits and alternatives for the proposed anesthesia with the patient or authorized representative who has indicated his/her understanding and acceptance.   Dental advisory given  Plan Discussed with: CRNA  Anesthesia Plan Comments:         Anesthesia Quick Evaluation

## 2011-11-03 NOTE — Anesthesia Postprocedure Evaluation (Signed)
  Anesthesia Post-op Note  Patient: Sylvia Barnett  Procedure(s) Performed: Procedure(s) (LRB) with comments: TOTAL KNEE ARTHROPLASTY (Right)  Patient Location: PACU  Anesthesia Type: Regional  Level of Consciousness: awake and alert   Airway and Oxygen Therapy: Patient Spontanous Breathing and Patient connected to face mask oxygen  Post-op Pain: none  Post-op Assessment: Post-op Vital signs reviewed, Patient's Cardiovascular Status Stable, Respiratory Function Stable and No signs of Nausea or vomiting  Post-op Vital Signs: Reviewed and stable  Complications: No apparent anesthesia complications

## 2011-11-04 ENCOUNTER — Encounter (HOSPITAL_COMMUNITY): Payer: Self-pay | Admitting: Orthopedic Surgery

## 2011-11-04 DIAGNOSIS — D62 Acute posthemorrhagic anemia: Secondary | ICD-10-CM | POA: Diagnosis not present

## 2011-11-04 LAB — CBC
HCT: 33.5 % — ABNORMAL LOW (ref 36.0–46.0)
Hemoglobin: 11.6 g/dL — ABNORMAL LOW (ref 12.0–15.0)
RDW: 12.6 % (ref 11.5–15.5)
WBC: 13.2 10*3/uL — ABNORMAL HIGH (ref 4.0–10.5)

## 2011-11-04 LAB — BASIC METABOLIC PANEL
BUN: 11 mg/dL (ref 6–23)
Chloride: 99 mEq/L (ref 96–112)
GFR calc Af Amer: 85 mL/min — ABNORMAL LOW (ref >90)
Potassium: 3.9 mEq/L (ref 3.5–5.1)

## 2011-11-04 MED ORDER — SODIUM CHLORIDE 0.9 % IV SOLN: INTRAVENOUS | Status: DC

## 2011-11-04 NOTE — Care Management Note (Signed)
    Page 1 of 2   11/06/2011     9:28:21 AM   CARE MANAGEMENT NOTE 11/06/2011  Patient:  Sylvia Barnett, Sylvia Barnett   Account Number:  [id number]  Date Initiated:  11/04/2011  Documentation initiated by:  Colleen Can  Subjective/Objective Assessment:   DX OSTEOARTHRITIS KNEE-RIGHT; TOTAL KNEE REPLACEMNT ON DAY OF ADMISSION     Action/Plan:   CM spoke with patient. Plans are for patient to return to her home in [place] where spouse will be caregiver. Already has DME. Requedting [facility]   Anticipated DC Date:  11/06/2011   Anticipated DC Plan:  HOME W HOME HEALTH SERVICES  In-house referral  NA      DC Planning Services  CM consult      [facility] Choice  HOME HEALTH   Choice offered to / List presented to:  C-1 Patient   DME arranged  NA      DME agency  NA     HH arranged  HH-2 PT      HH agency  [facility]   Status of service:  Completed, signed off Medicare Important Message given?  NA - LOS <3 / Initial given by admissions (If response is "NO", the following Medicare IM given date fields will be blank) Date Medicare IM given:   Date Additional Medicare IM given:    Discharge Disposition:  HOME W HOME HEALTH SERVICES  Per UR Regulation:    If discussed at Long Length of Stay Meetings, dates discussed:    Comments:  11/06/2011 Raynelle Bring BSN CCM [phone] DISCHARGED TO HOME WITH [facility] SERVICES IN PLACE 11/05/2011; SERVICES TO START 10/24/'2013.

## 2011-11-04 NOTE — Progress Notes (Signed)
Physical Therapy Treatment Patient Details Name: Sylvia Barnett MRN: [id number] DOB: [birth year] Today's Date: 11/04/2011 Time: 9528-4132 PT Time Calculation (min): 18 min  PT Assessment / Plan / Recommendation Comments on Treatment Session       Follow Up Recommendations  Home health PT     Does the patient have the potential to tolerate intense rehabilitation     Barriers to Discharge        Equipment Recommendations  None recommended by PT    Recommendations for Other Services OT consult  Frequency 7X/week   Plan Discharge plan remains appropriate    Precautions / Restrictions Precautions Precautions: Knee Required Braces or Orthoses: Knee Immobilizer - Right Knee Immobilizer - Right: Discontinue once straight leg raise with < 10 degree lag Restrictions Weight Bearing Restrictions: No Other Position/Activity Restrictions: WBAT   Pertinent Vitals/Pain 5/10; premedicated    Mobility  Bed Mobility Bed Mobility: Sit to Supine Sit to Supine: 4: Min assist Details for Bed Mobility Assistance: cues for seqence and use of L LE to self assist Transfers Transfers: Sit to Stand;Stand to Sit Sit to Stand: 4: Min assist Stand to Sit: 4: Min assist Details for Transfer Assistance: cues for LE management and use of UEs to self assist Ambulation/Gait Ambulation/Gait Assistance: 4: Min Environmental consultant (Feet): 75 Feet Assistive device: Rolling walker Ambulation/Gait Assistance Details: cues for sequence, posture and position from RW Gait Pattern: Step-to pattern    Exercises     PT Diagnosis:    PT Problem List:   PT Treatment Interventions:     PT Goals Acute Rehab PT Goals PT Goal Formulation: With patient Time For Goal Achievement: 11/10/11 Potential to Achieve Goals: Good Pt will go Supine/Side to Sit: with supervision PT Goal: Supine/Side to Sit - Progress: Goal set today Pt will go Sit to Supine/Side: with supervision PT Goal: Sit to Supine/Side  - Progress: Goal set today Pt will go Sit to Stand: with supervision PT Goal: Sit to Stand - Progress: Progressing toward goal Pt will go Stand to Sit: with supervision PT Goal: Stand to Sit - Progress: Progressing toward goal Pt will Ambulate: 51 - 150 feet;with supervision;with rolling walker PT Goal: Ambulate - Progress: Progressing toward goal Pt will Go Up / Down Stairs: 6-9 stairs;with least restrictive assistive device;with min assist PT Goal: Up/Down Stairs - Progress: Goal set today  Visit Information  Last PT Received On: 11/04/11 Assistance Needed: +1    Subjective Data  Patient Stated Goal: Resume previous lifestyle with decreased pain   Cognition  Overall Cognitive Status: Appears within functional limits for tasks assessed/performed Arousal/Alertness: Awake/alert Orientation Level: Appears intact for tasks assessed Behavior During Session: [facility] for tasks performed    Balance     End of Session PT - End of Session Equipment Utilized During Treatment: Right knee immobilizer Activity Tolerance: Patient tolerated treatment well Patient left: with call bell/phone within reach;in bed Nurse Communication: Mobility status   GP     Sylvia Barnett 11/04/2011, 3:17 PM

## 2011-11-04 NOTE — Evaluation (Signed)
Physical Therapy Evaluation Patient Details Name: Sylvia Barnett MRN: [id number] DOB: 04-13-1945 Today's Date: 11/04/2011 Time: 3086-5784 PT Time Calculation (min): 22 min  PT Assessment / Plan / Recommendation Clinical Impression  Pt s/p R TKR presents with decreased R LE strength/ROM limiting functional mobility    PT Assessment  Patient needs continued PT services    Follow Up Recommendations  Home health PT    Does the patient have the potential to tolerate intense rehabilitation      Barriers to Discharge None      Equipment Recommendations  None recommended by PT    Recommendations for Other Services OT consult   Frequency 7X/week    Precautions / Restrictions Precautions Precautions: Knee Required Braces or Orthoses: Knee Immobilizer - Right Knee Immobilizer - Right: Discontinue once straight leg raise with < 10 degree lag Restrictions Weight Bearing Restrictions: No Other Position/Activity Restrictions: WBAT   Pertinent Vitals/Pain 6/10 with activity; pt premedicated, cold packs provided     Mobility  Bed Mobility Bed Mobility: Supine to Sit Supine to Sit: 4: Min assist;3: Mod assist Details for Bed Mobility Assistance: cues for seqence and use of L LE to self assist Transfers Transfers: Sit to Stand;Stand to Sit Sit to Stand: 3: Mod assist Stand to Sit: 4: Min assist;3: Mod assist Details for Transfer Assistance: cues for LE management and use of UEs to self assist Ambulation/Gait Ambulation/Gait Assistance: 4: Min assist;3: Mod assist Ambulation Distance (Feet): 18 Feet Assistive device: Rolling walker Ambulation/Gait Assistance Details: cues for sequence, posture and position from RW Gait Pattern: Step-to pattern    Shoulder Instructions     Exercises Total Joint Exercises Ankle Circles/Pumps: AROM;Both;10 reps;Supine Quad Sets: AROM;Both;10 reps;Supine Heel Slides: AAROM;10 reps;Supine;Both Straight Leg Raises: AAROM;10 reps;Supine;Right     PT Diagnosis: Difficulty walking  PT Problem List: Decreased strength;Decreased range of motion;Decreased activity tolerance;Decreased mobility;Decreased knowledge of use of DME;Pain PT Treatment Interventions: DME instruction;Gait training;Stair training;Functional mobility training;Therapeutic activities;Therapeutic exercise;Patient/family education   PT Goals Acute Rehab PT Goals PT Goal Formulation: With patient Time For Goal Achievement: 11/10/11 Potential to Achieve Goals: Good Pt will go Supine/Side to Sit: with supervision PT Goal: Supine/Side to Sit - Progress: Goal set today Pt will go Sit to Supine/Side: with supervision PT Goal: Sit to Supine/Side - Progress: Goal set today Pt will go Sit to Stand: with supervision PT Goal: Sit to Stand - Progress: Goal set today Pt will go Stand to Sit: with supervision PT Goal: Stand to Sit - Progress: Goal set today Pt will Ambulate: 51 - 150 feet;with supervision;with rolling walker PT Goal: Ambulate - Progress: Goal set today Pt will Go Up / Down Stairs: 6-9 stairs;with least restrictive assistive device;with min assist PT Goal: Up/Down Stairs - Progress: Goal set today  Visit Information  Last PT Received On: 11/04/11 Assistance Needed: +1    Subjective Data  Subjective: I've got my makeup on so I guess I am ready for you Patient Stated Goal: Resume previous lifestyle with decreased pain   Prior Functioning  Home Living Lives With: Spouse Available Help at Discharge: Family Type of Home: House Home Access: Level entry Home Layout: Two level Alternate Level Stairs-Number of Steps: 7 Alternate Level Stairs-Rails: Right Home Adaptive Equipment: Walker - rolling Prior Function Level of Independence: Independent Able to Take Stairs?: Yes Driving: Yes Communication Communication: No difficulties    Cognition  Overall Cognitive Status: Appears within functional limits for tasks assessed/performed Arousal/Alertness:  Awake/alert Orientation Level: Appears intact for  tasks assessed Behavior During Session: [facility] for tasks performed    Extremity/Trunk Assessment Right Upper Extremity Assessment RUE ROM/Strength/Tone: [facility] for tasks assessed Left Upper Extremity Assessment LUE ROM/Strength/Tone: [facility] for tasks assessed Right Lower Extremity Assessment RLE ROM/Strength/Tone: Deficits RLE ROM/Strength/Tone Deficits: Quads 2+/5 with AAROM at knee -10 - 40 Left Lower Extremity Assessment LLE ROM/Strength/Tone: Deficits LLE ROM/Strength/Tone Deficits: strength WFL; AROM 0 - 75   Balance    End of Session PT - End of Session Equipment Utilized During Treatment: Right knee immobilizer Activity Tolerance: Patient tolerated treatment well Patient left: in chair;with call bell/phone within reach Nurse Communication: Mobility status  GP     Sylvia Barnett 11/04/2011, 12:03 PM

## 2011-11-04 NOTE — Progress Notes (Signed)
   Subjective: 1 Day Post-Op Procedure(s) (LRB): TOTAL KNEE ARTHROPLASTY (Right) Patient reports pain as mild and moderate.   Patient seen in rounds with Dr. Lequita Halt.  Patient had a rough night last night but better today. Patient is having problems with pain in the knee, requiring pain medications We will start therapy today.  Plan is to go Home after hospital stay.  Objective: Vital signs in last 24 hours: Temp:  [97.4 F (36.3 C)-98.3 F (36.8 C)] 98.3 F (36.8 C) (10/22 0645) Pulse Rate:  [52-86] 86  (10/22 0645) Resp:  [11-20] 14  (10/22 0645) BP: (106-179)/(53-95) 172/85 mmHg (10/22 0645) SpO2:  [95 %-100 %] 99 % (10/22 0645) FiO2 (%):  [100 %] 100 % (10/21 1530) Weight:  [90.719 kg (200 lb)] 90.719 kg (200 lb) (10/21 1530)  Intake/Output from previous day:  Intake/Output Summary (Last 24 hours) at 11/04/11 0754 Last data filed at 11/04/11 0555  Gross per 24 hour  Intake   2625 ml  Output   2970 ml  Net   -345 ml    Intake/Output this shift: UOP 1000  Labs:  [facility] 11/04/11 0435  HGB 11.6*    Basename 11/04/11 0435  WBC 13.2*  RBC 3.77*  HCT 33.5*  PLT 208    Basename 11/04/11 0435  NA 136  K 3.9  CL 99  CO2 28  BUN 11  CREATININE 0.81  GLUCOSE 169*  CALCIUM 9.0   No results found for this basename: LABPT:2,INR:2 in the last 72 hours  EXAM General - Patient is Alert, Appropriate and Oriented Extremity - Neurovascular intact Sensation intact distally Dorsiflexion/Plantar flexion intact Dressing - dressing C/D/I Motor Function - intact, moving foot and toes well on exam.  Hemovac pulled without difficulty.  Past Medical History  Diagnosis Date  . Complication of anesthesia     Has a small throat  . Hypertension   . Arthritis     Right hip  . Anxiety   . Depression   . Cancer 2004     Breast Lumpectomy    Assessment/Plan: 1 Day Post-Op Procedure(s) (LRB): TOTAL KNEE ARTHROPLASTY (Right) Principal Problem:  *OA (osteoarthritis)  of knee Active Problems:  Postop Acute blood loss anemia  Estimated Body mass index is 33.28 kg/(m^2) as calculated from the following:   Height as of this encounter: 5\' 5" (1.651 m).   Weight as of this encounter: 200 lb(90.719 kg). Advance diet Up with therapy Plan for discharge tomorrow Discharge home with home health  DVT Prophylaxis - Xarelto Weight-Bearing as tolerated to right leg No vaccines. D/C O2 and Pulse OX and try on Room [address]  Patrica Duel 11/04/2011, 7:54 AM

## 2011-11-04 NOTE — Clinical Documentation Improvement (Signed)
Abnormal Labs Clarification  THIS DOCUMENT IS NOT A PERMANENT PART OF THE MEDICAL RECORD  TO RESPOND TO THE THIS QUERY, FOLLOW THE INSTRUCTIONS BELOW:  1. If needed, update documentation for the patient's encounter via the notes activity.  2. Access this query again and click edit on the [employer].  3. After updating, or not, click F2 to complete all highlighted (required) fields concerning your review. Select "additional documentation in the medical record" OR "no additional documentation provided".  4. Click Sign note button.  5. The deficiency will fall out of your InBasket *Please let us know if you are not able to complete this workflow by phone or e-mail (listed below).  Please update your documentation within the medical record to reflect your response to this query.                                                                                   11/04/11  Dear Avel Peace PA for Dr. Lequita Halt [place]  In a better effort to capture your patient's severity of illness, reflect appropriate length of stay and utilization of resources, a review of the medical record has revealed the following indicators.    Based on your clinical judgment, please clarify and document in a progress note and/or discharge summary the clinical condition associated with the following supporting information:  In responding to this query please exercise your independent judgment.  The fact that a query is asked, does not imply that any particular answer is desired or expected.  Abnormal findings (laboratory, x-ray, pathologic, and other diagnostic results) are not coded and reported unless the physician indicates their clinical significance.   The medical record reflects the following clinical findings, please clarify the diagnostic and/or clinical significance:      Based on your clinical judgment can you provide a diagnosis that represents the below listed clinical indicators?  In this patient  admitted with OA with R TKA a review of the medical record reveal the following:   Abnormal UA  Appearance=Cloudy  Leukocytes= Moderate  WBC=7-10  Bacteria=Many  Treatment  Ancef   Please document the underlying diagnosis/condition and causative organism (if known) responsible for the abnormal U/A and document in the medical records  Possible Clinical Conditions?                                    Other Condition___________________                Cannot Clinically Determine_________   Reviewed:  no additional documentation provided ljh   Thank You,  Enis Slipper  RN, BSN, MSN/Inf, CCDS Clinical Documentation Specialist [facility] Dept Pager: [phone] / E-mail: [email or web address]  Health Information Management [place]

## 2011-11-05 LAB — CBC
MCH: 31 pg (ref 26.0–34.0)
MCV: 89.8 fL (ref 78.0–100.0)
Platelets: 183 10*3/uL (ref 150–400)
RBC: 3.32 MIL/uL — ABNORMAL LOW (ref 3.87–5.11)
RDW: 13.2 % (ref 11.5–15.5)
WBC: 13.2 10*3/uL — ABNORMAL HIGH (ref 4.0–10.5)

## 2011-11-05 LAB — BASIC METABOLIC PANEL
Calcium: 8.8 mg/dL (ref 8.4–10.5)
Chloride: 99 mEq/L (ref 96–112)
Creatinine, Ser: 0.75 mg/dL (ref 0.50–1.10)
GFR calc Af Amer: >90 mL/min (ref >90)
Sodium: 137 mEq/L (ref 135–145)

## 2011-11-05 MED ORDER — METHOCARBAMOL 500 MG PO TABS: 500.0000 mg | ORAL_TABLET | Freq: Four times a day (QID) | ORAL | Status: DC | PRN

## 2011-11-05 MED ORDER — OXYCODONE HCL 5 MG PO TABS: 5.0000 mg | ORAL_TABLET | ORAL | Status: DC | PRN

## 2011-11-05 MED ORDER — RIVAROXABAN 10 MG PO TABS: 10.0000 mg | ORAL_TABLET | Freq: Every day | ORAL | Status: DC

## 2011-11-05 MED ORDER — HYDROCHLOROTHIAZIDE 25 MG PO TABS
25.0000 mg | ORAL_TABLET | Freq: Every day | ORAL | Status: DC
  Administered 2011-11-05: 25 mg via ORAL
  Filled 2011-11-05: qty 1

## 2011-11-05 NOTE — Progress Notes (Signed)
Physical Therapy Treatment Patient Details Name: Sylvia Barnett MRN: [id number] DOB: 05/30/1945 Today's Date: 11/05/2011 Time: 1000-1025 PT Time Calculation (min): 25 min  PT Assessment / Plan / Recommendation Comments on Treatment Session  POD #2 am session R TKR.  Performed TKR TE's while in bed, assisted pt OOB to BR, then amb in hallway.  Pt plans to D/C to home later this afetrnoon.    Follow Up Recommendations  Home health PT     Does the patient have the potential to tolerate intense rehabilitation     Barriers to Discharge        Equipment Recommendations  None recommended by PT    Recommendations for Other Services    Frequency 7X/week   Plan Discharge plan remains appropriate    Precautions / Restrictions Precautions Precautions: Knee Precaution Comments: Pt able to perform 10 active SLR Restrictions Weight Bearing Restrictions: No Other Position/Activity Restrictions: WBAT    Pertinent Vitals/Pain C/o 3/10 ICE applied    Mobility  Bed Mobility Bed Mobility: Supine to Sit Supine to Sit: 4: Min guard Details for Bed Mobility Assistance: increased time and able to move her R LE off bed w/o assist  Transfers Transfers: Sit to Stand;Stand to Sit Sit to Stand: 5: Supervision;4: Min guard;From toilet;From bed Stand to Sit: 4: Min guard;5: Supervision;To chair/3-in-1;To toilet Details for Transfer Assistance: <25% VC's on safety with turns and increased time  Ambulation/Gait Ambulation/Gait Assistance: 4: Min guard Ambulation Distance (Feet): 110 Feet Assistive device: Rolling walker Ambulation/Gait Assistance Details: <25% VC's on proper walker to self distance and increased time Gait Pattern: Step-to pattern;Decreased stance time - right;Trunk flexed Gait velocity: decreased    Exercises Total Joint Exercises Ankle Circles/Pumps: AROM;Both;10 reps;Supine Quad Sets: AROM;Both;10 reps;Supine Gluteal Sets: AROM;Both;10 reps;Supine Towel Squeeze:  AROM;Both;10 reps;Supine Short Arc Quad: AAROM;Right;10 reps;Supine Heel Slides: AAROM;Right;10 reps;Supine (using bed sheet) Hip ABduction/ADduction: AROM;Right;10 reps;Supine Straight Leg Raises: AROM;Right;10 reps;Supine    PT Goals    Visit Information  Last PT Received On: 11/05/11 Assistance Needed: +1    Subjective Data      Cognition       Balance     End of Session PT - End of Session Equipment Utilized During Treatment: Gait belt Activity Tolerance: Patient tolerated treatment well Patient left: in chair;with call bell/phone within reach (ICE R LE)   Felecia Shelling  PTA [facility]  Acute  Rehab Pager     [phone]

## 2011-11-05 NOTE — Progress Notes (Signed)
OT Note:  Pt screened for OT.  She had previous TKA and has no OT needs. [place], OTR/L [phone] 11/05/2011

## 2011-11-05 NOTE — Progress Notes (Signed)
Physical Therapy Treatment Patient Details Name: Sylvia Barnett MRN: [id number] DOB: Jul 10, 1945 Today's Date: 11/05/2011 Time: 5621-3086 PT Time Calculation (min): 25 min  PT Assessment / Plan / Recommendation Comments on Treatment Session  POD #2 pm session.  Spouse present during session.  Practiced stairs then amb in hallway.  Pt plans to D/C to home later today.    Follow Up Recommendations  Home health PT     Does the patient have the potential to tolerate intense rehabilitation     Barriers to Discharge        Equipment Recommendations  None recommended by PT    Recommendations for Other Services    Frequency 7X/week   Plan Discharge plan remains appropriate    Precautions / Restrictions Precautions Precautions: Knee Precaution Comments: Pt able to perform 10 active SLR Restrictions Weight Bearing Restrictions: No Other Position/Activity Restrictions: WBAT   Pertinent Vitals/Pain C/o 3/10 ICE applied   Mobility  Bed Mobility Bed Mobility: Not assessed Supine to Sit: 4: Min guard Details for Bed Mobility Assistance: Pt OOB in recliner Transfers Transfers: Sit to Stand;Stand to Sit Sit to Stand: 5: Supervision;From chair/3-in-1 Stand to Sit: 5: Supervision;To chair/3-in-1 Details for Transfer Assistance: One VC on safety with hand placement prior to sit  Ambulation/Gait Ambulation/Gait Assistance: 5: Supervision Ambulation Distance (Feet): 125 Feet Assistive device: Rolling walker Ambulation/Gait Assistance Details: increased time Gait Pattern: Step-to pattern;Decreased stance time - right;Decreased stride length;Trunk flexed Gait velocity: decreased  Stairs: Yes Stairs Assistance: 4: Min guard;4: Min assist Stairs Assistance Details (indicate cue type and reason): with spouse present for education/instruction Stair Management Technique: One rail Left;Forwards;With crutches Number of Stairs: 4          PT Goals progressing    Visit  Information  Last PT Received On: 11/05/11 Assistance Needed: +1    Subjective Data      Cognition       Balance     End of Session PT - End of Session Equipment Utilized During Treatment: Gait belt Activity Tolerance: Patient tolerated treatment well Patient left: in chair;with call bell/phone within reach;Other (comment) (ICE to R knee)   Felecia Shelling  PTA [facility]  Acute  Rehab Pager     [phone]

## 2011-11-05 NOTE — Progress Notes (Signed)
   Subjective: 2 Days Post-Op Procedure(s) (LRB): TOTAL KNEE ARTHROPLASTY (Right) Patient reports pain as mild.   Patient seen in rounds for Dr. Lequita Halt. She has had ongoing problems with blood pressure.  It has been running high since surgery.  She was also noted t have elevate BP preop.  Dr. Neita Carp has been working with her to get better control of pressure.  Work with pain meds today and if she does well then home today and follow up closely with Dr. Neita Carp. Patient is well, and has had no acute complaints or problems Patient is ready to go homoe if does well with therapy.  Objective: Vital signs in last 24 hours: Temp:  [98.3 F (36.8 C)-99.2 F (37.3 C)] 99.2 F (37.3 C) (10/23 0437) Pulse Rate:  [68-112] 71  (10/23 0437) Resp:  [14-16] 16  (10/23 0437) BP: (181-186)/(76-93) 185/76 mmHg (10/23 0437) SpO2:  [94 %-98 %] 94 % (10/23 0437)  Intake/Output from previous day:  Intake/Output Summary (Last 24 hours) at 11/05/11 0745 Last data filed at 11/05/11 0437  Gross per 24 hour  Intake   1060 ml  Output    600 ml  Net    460 ml    Intake/Output this shift:    Labs:  Basename 11/05/11 0430 11/04/11 0435  HGB 10.3* 11.6*    Basename 11/05/11 0430 11/04/11 0435  WBC 13.2* 13.2*  RBC 3.32* 3.77*  HCT 29.8* 33.5*  PLT 183 208    Basename 11/05/11 0430 11/04/11 0435  NA 137 136  K 3.5 3.9  CL 99 99  CO2 29 28  BUN 15 11  CREATININE 0.75 0.81  GLUCOSE 136* 169*  CALCIUM 8.8 9.0   No results found for this basename: LABPT:2,INR:2 in the last 72 hours  EXAM: General - Patient is Alert, Appropriate and Oriented Extremity - Neurovascular intact Sensation intact distally Dorsiflexion/Plantar flexion intact No cellulitis present Incision - clean, dry, no drainage, healing Motor Function - intact, moving foot and toes well on exam.   Assessment/Plan: 2 Days Post-Op Procedure(s) (LRB): TOTAL KNEE ARTHROPLASTY (Right) Procedure(s) (LRB): TOTAL KNEE ARTHROPLASTY  (Right) Past Medical History  Diagnosis Date  . Complication of anesthesia     Has a small throat  . Hypertension   . Arthritis     Right hip  . Anxiety   . Depression   . Cancer 2004     Breast Lumpectomy   Principal Problem:  *OA (osteoarthritis) of knee Active Problems:  Postop Acute blood loss anemia  Estimated Body mass index is 33.28 kg/(m^2) as calculated from the following:   Height as of this encounter: 5\' 5" (1.651 m).   Weight as of this encounter: 200 lb(90.719 kg). Up with therapy Discharge home with home health Diet - Cardiac diet Follow up - in 2 weeks Activity - WBAT Disposition - Home Condition Upon Discharge - Good D/C Meds - See DC Summary DVT Prophylaxis - Xarelto  Lynnsey Barbara 11/05/2011, 7:45 AM

## 2011-11-05 NOTE — Discharge Summary (Signed)
Physician Discharge Summary   Patient ID: Sylvia Barnett MRN: [id number] DOB/AGE: Dec 11, 1945 66 y.o.  Admit date: 11/03/2011 Discharge date: 11/05/2011  Primary Diagnosis: Osteoarthritis Right knee   Admission Diagnoses:  Past Medical History  Diagnosis Date  . Complication of anesthesia     Has a small throat  . Hypertension   . Arthritis     Right hip  . Anxiety   . Depression   . Cancer 2004     Breast Lumpectomy   Discharge Diagnoses:   Principal Problem:  *OA (osteoarthritis) of knee Active Problems:  Postop Acute blood loss anemia  Estimated Body mass index is 33.28 kg/(m^2) as calculated from the following:   Height as of this encounter: 5\' 5" (1.651 m).   Weight as of this encounter: 200 lb(90.719 kg).  Classification of overweight in adults according to BMI (WHO, 1998)   Procedure:  Procedure(s) (LRB): TOTAL KNEE ARTHROPLASTY (Right)   Consults: None  HPI: Sylvia Barnett is a 66 y.o. year old female with end stage OA of her right knee with progressively worsening pain and dysfunction. She has constant pain, with activity and at rest and significant functional deficits with difficulties even with ADLs. She has had extensive non-op management including analgesics, injections of cortisone and viscosupplements, and home exercise program, but remains in significant pain with significant dysfunction.Radiographs show bone on bone arthritis medial and patellofemoral with large osteophytes. She presents now for right Total Knee Arthroplasty.   Laboratory Data: Admission on 11/03/2011  Component Date Value Range Status  . ABO/RH(D) 11/03/2011 AB POS   Final  . Antibody Screen 11/03/2011 NEG   Final  . Sample Expiration 11/03/2011 11/06/2011   Final  . WBC 11/04/2011 13.2* 4.0 - 10.5 K/uL Final  . RBC 11/04/2011 3.77* 3.87 - 5.11 MIL/uL Final  . Hemoglobin 11/04/2011 11.6* 12.0 - 15.0 g/dL Final  . HCT 84/13/2440 33.5* 36.0 - 46.0 % Final  . MCV 11/04/2011  88.9  78.0 - 100.0 fL Final  . MCH 11/04/2011 30.8  26.0 - 34.0 pg Final  . MCHC 11/04/2011 34.6  30.0 - 36.0 g/dL Final  . RDW 11/09/2534 12.6  11.5 - 15.5 % Final  . Platelets 11/04/2011 208  150 - 400 K/uL Final  . Sodium 11/04/2011 136  135 - 145 mEq/L Final  . Potassium 11/04/2011 3.9  3.5 - 5.1 mEq/L Final  . Chloride 11/04/2011 99  96 - 112 mEq/L Final  . CO2 11/04/2011 28  19 - 32 mEq/L Final  . Glucose, Bld 11/04/2011 169* 70 - 99 mg/dL Final  . BUN 64/40/3474 11  6 - 23 mg/dL Final  . Creatinine, Ser 11/04/2011 0.81  0.50 - 1.10 mg/dL Final  . Calcium 25/95/6387 9.0  8.4 - 10.5 mg/dL Final  . GFR calc non Af Amer 11/04/2011 73* >90 mL/min Final  . GFR calc Af Amer 11/04/2011 85* >90 mL/min Final   Comment:                                 The eGFR has been calculated                          using the CKD EPI equation.                          This calculation has not been  validated in all clinical                          situations.                          eGFR's persistently                          <90 mL/min signify                          possible Chronic Kidney Disease.  . WBC 11/05/2011 13.2* 4.0 - 10.5 K/uL Final  . RBC 11/05/2011 3.32* 3.87 - 5.11 MIL/uL Final  . Hemoglobin 11/05/2011 10.3* 12.0 - 15.0 g/dL Final  . HCT 16/10/9602 29.8* 36.0 - 46.0 % Final  . MCV 11/05/2011 89.8  78.0 - 100.0 fL Final  . MCH 11/05/2011 31.0  26.0 - 34.0 pg Final  . MCHC 11/05/2011 34.6  30.0 - 36.0 g/dL Final  . RDW 54/09/8117 13.2  11.5 - 15.5 % Final  . Platelets 11/05/2011 183  150 - 400 K/uL Final  . Sodium 11/05/2011 137  135 - 145 mEq/L Final  . Potassium 11/05/2011 3.5  3.5 - 5.1 mEq/L Final  . Chloride 11/05/2011 99  96 - 112 mEq/L Final  . CO2 11/05/2011 29  19 - 32 mEq/L Final  . Glucose, Bld 11/05/2011 136* 70 - 99 mg/dL Final  . BUN 14/78/2956 15  6 - 23 mg/dL Final  . Creatinine, Ser 11/05/2011 0.75  0.50 - 1.10 mg/dL Final  . Calcium  21/30/8657 8.8  8.4 - 10.5 mg/dL Final  . GFR calc non Af Amer 11/05/2011 85* >90 mL/min Final  . GFR calc Af Amer 11/05/2011 >90  >90 mL/min Final   Comment:                                 The eGFR has been calculated                          using the CKD EPI equation.                          This calculation has not been                          validated in all clinical                          situations.                          eGFR's persistently                          <90 mL/min signify                          possible Chronic Kidney Disease.  Hospital Outpatient Visit on 10/28/2011  Component Date Value Range Status  . aPTT 10/28/2011 30  24 - 37 seconds Final  . WBC 10/28/2011 6.9  4.0 - 10.5 K/uL Final  . RBC 10/28/2011  4.56  3.87 - 5.11 MIL/uL Final  . Hemoglobin 10/28/2011 14.1  12.0 - 15.0 g/dL Final  . HCT 45/40/9811 41.7  36.0 - 46.0 % Final  . MCV 10/28/2011 91.4  78.0 - 100.0 fL Final  . MCH 10/28/2011 30.9  26.0 - 34.0 pg Final  . MCHC 10/28/2011 33.8  30.0 - 36.0 g/dL Final  . RDW 91/47/8295 12.9  11.5 - 15.5 % Final  . Platelets 10/28/2011 198  150 - 400 K/uL Final  . Sodium 10/28/2011 141  135 - 145 mEq/L Final  . Potassium 10/28/2011 3.8  3.5 - 5.1 mEq/L Final  . Chloride 10/28/2011 104  96 - 112 mEq/L Final  . CO2 10/28/2011 31  19 - 32 mEq/L Final  . Glucose, Bld 10/28/2011 99  70 - 99 mg/dL Final  . BUN 62/13/0865 21  6 - 23 mg/dL Final  . Creatinine, Ser 10/28/2011 0.79  0.50 - 1.10 mg/dL Final  . Calcium 78/46/9629 9.6  8.4 - 10.5 mg/dL Final  . Total Protein 10/28/2011 6.6  6.0 - 8.3 g/dL Final  . Albumin 52/84/1324 3.7  3.5 - 5.2 g/dL Final  . AST 40/10/2723 14  0 - 37 U/L Final  . ALT 10/28/2011 16  0 - 35 U/L Final  . Alkaline Phosphatase 10/28/2011 89  39 - 117 U/L Final  . Total Bilirubin 10/28/2011 0.3  0.3 - 1.2 mg/dL Final  . GFR calc non Af Amer 10/28/2011 84* >90 mL/min Final  . GFR calc Af Amer 10/28/2011 >90  >90 mL/min Final    Comment:                                 The eGFR has been calculated                          using the CKD EPI equation.                          This calculation has not been                          validated in all clinical                          situations.                          eGFR's persistently                          <90 mL/min signify                          possible Chronic Kidney Disease.  Marland Kitchen Prothrombin Time 10/28/2011 12.7  11.6 - 15.2 seconds Final  . INR 10/28/2011 0.96  0.00 - 1.49 Final  . Color, Urine 10/28/2011 YELLOW  YELLOW Final  . APPearance 10/28/2011 CLOUDY* CLEAR Final  . Specific Gravity, Urine 10/28/2011 1.020  1.005 - 1.030 Final  . pH 10/28/2011 6.0  5.0 - 8.0 Final  . Glucose, UA 10/28/2011 NEGATIVE  NEGATIVE mg/dL Final  . Hgb urine dipstick 10/28/2011 NEGATIVE  NEGATIVE Final  . Bilirubin Urine 10/28/2011 NEGATIVE  NEGATIVE Final  . Ketones, ur  10/28/2011 NEGATIVE  NEGATIVE mg/dL Final  . Protein, ur 16/10/9602 NEGATIVE  NEGATIVE mg/dL Final  . Urobilinogen, UA 10/28/2011 0.2  0.0 - 1.0 mg/dL Final  . Nitrite 54/09/8117 NEGATIVE  NEGATIVE Final  . Leukocytes, UA 10/28/2011 MODERATE* NEGATIVE Final  . Squamous Epithelial / LPF 10/28/2011 FEW* RARE Final  . WBC, UA 10/28/2011 7-10  <3 WBC/hpf Final  . Bacteria, UA 10/28/2011 MANY* RARE Final  . MRSA, PCR 10/28/2011 NEGATIVE  NEGATIVE Final  . Staphylococcus aureus 10/28/2011 NEGATIVE  NEGATIVE Final   Comment:                                 The Xpert SA Assay (FDA                          approved for NASAL specimens                          in patients over 32 years of age),                          is one component of                          a comprehensive surveillance                          program.  Test performance has                          been validated by [employer] for patients greater                          than or equal to 40 year old.                           It is not intended                          to diagnose infection nor to                          guide or monitor treatment.     X-Rays:Dg Chest 2 View  10/28/2011  *RADIOLOGY REPORT*  Clinical Data: Preoperative evaluation for total knee replacement.  CHEST - 2 VIEW  Comparison: No priors.  Findings: Lung volumes are normal.  No consolidative airspace disease.  No pleural effusions.  No pneumothorax.  No pulmonary nodule or mass noted.  Pulmonary vasculature and the cardiomediastinal silhouette are within normal limits. Atherosclerosis in the thoracic aorta.  Surgical clips project over the right upper quadrant of the abdomen, likely from prior cholecystectomy.  IMPRESSION: 1. No radiographic evidence of acute cardiopulmonary disease. 2.  Atherosclerosis.   Original Report Authenticated By: Florencia Reasons, M.D.     EKG: Orders placed during the hospital encounter of 11/03/11  . EKG     Hospital Course:  Sylvia Barnett is a 66 y.o. who was  admitted to [facility]. They were brought to the operating room on 11/03/2011 and underwent Procedure(s): TOTAL KNEE ARTHROPLASTY.  Patient tolerated the procedure well and was later transferred to the recovery room and then to the orthopaedic floor for postoperative care.  They were given PO and IV analgesics for pain control following their surgery.  They were given 24 hours of postoperative antibiotics of  Anti-infectives     Start     Dose/Rate Route Frequency Ordered Stop   11/03/11 1900   ceFAZolin (ANCEF) IVPB 2 g/50 mL premix        2 g 100 mL/hr over 30 Minutes Intravenous Every 6 hours 11/03/11 1604 11/04/11 0030   11/03/11 1153   ceFAZolin (ANCEF) IVPB 2 g/50 mL premix        2 g 100 mL/hr over 30 Minutes Intravenous 60 min pre-op 11/03/11 1153 11/03/11 1319         and started on DVT prophylaxis in the form of Xarelto.   PT and OT were ordered for total joint protocol.  Discharge planning consulted to  help with postop disposition and equipment needs.  Patient had a rough night on the evening of surgery and started to get up OOB with therapy on day one and walked 75 feet. Hemovac drain was pulled without difficulty.  Continued to work with therapy into day two and progressed with her mobility.  Dressing was changed on day two and the incision was healing well. She had ongoing problems with blood pressure. It had been running high ever since surgery. She was also noted to have elevate BP preop. Dr. Neita Carp had been working with her to get better control of pressure. Worked with pain meds today and planned that if she did well then home later that day and follow up closely with Dr. Neita Carp.  Blood pressure did come back down with the systolic int he 150's like it was preop.  Patient was seen in rounds and was ready to go home later that afternoon.   Discharge Medications: Prior to Admission medications   Medication Sig Start Date End Date Taking? Authorizing Provider  atorvastatin (LIPITOR) 10 MG tablet Take 10 mg by mouth at bedtime.   Yes Historical Provider, MD  citalopram (CELEXA) 10 MG tablet Take 10 mg by mouth daily.   Yes Historical Provider, MD  hydrochlorothiazide (HYDRODIURIL) 25 MG tablet Take 25 mg by mouth every morning.   Yes Historical Provider, MD  Liniments (BLUE-EMU SUPER STRENGTH) CREA Apply 1 application topically as needed. Apply to painful areas around hip and back   Yes Historical Provider, MD  mirabegron ER (MYRBETRIQ) 25 MG TB24 Take 25 mg by mouth every morning.   Yes Historical Provider, MD  traMADol (ULTRAM) 50 MG tablet Take 50 mg by mouth every 6 (six) hours as needed. For pain   Yes Historical Provider, MD  trolamine salicylate (ASPERCREME) 10 % cream Apply 1 application topically as needed. Apply to painful areas around hip and back   Yes Historical Provider, MD  methocarbamol (ROBAXIN) 500 MG tablet Take 1 tablet (500 mg total) by mouth every 6 (six) hours as needed.  11/05/11   Kathleene Bergemann, PA  oxyCODONE (OXY IR/ROXICODONE) 5 MG immediate release tablet Take 1-2 tablets (5-10 mg total) by mouth every 3 (three) hours as needed. 11/05/11   Shyanna Klingel Julien Girt, PA  rivaroxaban (XARELTO) 10 MG TABS tablet Take 1 tablet (10 mg total) by mouth daily with breakfast. Take Xarelto for two and a  half more weeks, then discontinue Xarelto. 11/05/11   Carmon Sahli, PA    Diet: Cardiac diet Activity:WBAT Follow-up:in 2 weeks Disposition - Home Discharged Condition: good   Discharge Orders    Future Orders Please Complete By Expires   Diet - low sodium heart healthy      Call MD / Call 911      Comments:   If you experience chest pain or shortness of breath, CALL 911 and be transported to the hospital emergency room.  If you develope a fever above 101 F, pus (white drainage) or increased drainage or redness at the wound, or calf pain, call your surgeon's office.   Discharge instructions      Comments:   Pick up stool softner and laxative for home. Do not submerge incision under water. May shower. Continue to use ice for pain and swelling from surgery.  Take Xarelto for two and a half more weeks, then discontinue Xarelto.   Constipation Prevention      Comments:   Drink plenty of fluids.  Prune juice may be helpful.  You may use a stool softener, such as Colace (over the counter) 100 mg twice a day.  Use MiraLax (over the counter) for constipation as needed.   Increase activity slowly as tolerated      Patient may shower      Comments:   You may shower without a dressing once there is no drainage.  Do not wash over the wound.  If drainage remains, do not shower until drainage stops.   Weight bearing as tolerated      Driving restrictions      Comments:   No driving until released by the physician.   Lifting restrictions      Comments:   No lifting until released by the physician.   TED hose      Comments:   Use stockings (TED hose) for 3  weeks on both leg(s).  You may remove them at night for sleeping.   Change dressing      Comments:   Change dressing daily with sterile 4 x 4 inch gauze dressing and apply TED hose. Do not submerge the incision under water.   Do not put a pillow under the knee. Place it under the heel.      Do not sit on low chairs, stoools or toilet seats, as it may be difficult to get up from low surfaces          Medication List     As of 11/05/2011  7:51 AM    STOP taking these medications         naproxen sodium 220 MG tablet   Commonly known as: ANAPROX      TAKE these medications         atorvastatin 10 MG tablet   Commonly known as: LIPITOR   Take 10 mg by mouth at bedtime.      BLUE-EMU SUPER STRENGTH Crea   Apply 1 application topically as needed. Apply to painful areas around hip and back      citalopram 10 MG tablet   Commonly known as: CELEXA   Take 10 mg by mouth daily.      hydrochlorothiazide 25 MG tablet   Commonly known as: HYDRODIURIL   Take 25 mg by mouth every morning.      methocarbamol 500 MG tablet   Commonly known as: ROBAXIN   Take 1 tablet (500 mg total) by mouth every 6 (  six) hours as needed.      MYRBETRIQ 25 MG Tb24   Generic drug: mirabegron ER   Take 25 mg by mouth every morning.      oxyCODONE 5 MG immediate release tablet   Commonly known as: Oxy IR/ROXICODONE   Take 1-2 tablets (5-10 mg total) by mouth every 3 (three) hours as needed.      rivaroxaban 10 MG Tabs tablet   Commonly known as: XARELTO   Take 1 tablet (10 mg total) by mouth daily with breakfast. Take Xarelto for two and a half more weeks, then discontinue Xarelto.      traMADol 50 MG tablet   Commonly known as: ULTRAM   Take 50 mg by mouth every 6 (six) hours as needed. For pain      trolamine salicylate 10 % cream   Commonly known as: ASPERCREME   Apply 1 application topically as needed. Apply to painful areas around hip and back           Follow-up Information    Follow  up with Loanne Drilling, MD. Schedule an appointment as soon as possible for a visit in 2 weeks.   Contact information:   [address] [place] [address] [phone]          Signed: Patrica Duel 11/05/2011, 7:51 AM

## 2011-11-06 DIAGNOSIS — Z96659 Presence of unspecified artificial knee joint: Secondary | ICD-10-CM | POA: Diagnosis not present

## 2011-11-06 DIAGNOSIS — R262 Difficulty in walking, not elsewhere classified: Secondary | ICD-10-CM | POA: Diagnosis not present

## 2011-11-06 DIAGNOSIS — IMO0001 Reserved for inherently not codable concepts without codable children: Secondary | ICD-10-CM | POA: Diagnosis not present

## 2011-11-06 DIAGNOSIS — Z471 Aftercare following joint replacement surgery: Secondary | ICD-10-CM | POA: Diagnosis not present

## 2011-11-06 DIAGNOSIS — M5137 Other intervertebral disc degeneration, lumbosacral region: Secondary | ICD-10-CM | POA: Diagnosis not present

## 2011-11-07 DIAGNOSIS — M5137 Other intervertebral disc degeneration, lumbosacral region: Secondary | ICD-10-CM | POA: Diagnosis not present

## 2011-11-07 DIAGNOSIS — R262 Difficulty in walking, not elsewhere classified: Secondary | ICD-10-CM | POA: Diagnosis not present

## 2011-11-07 DIAGNOSIS — Z471 Aftercare following joint replacement surgery: Secondary | ICD-10-CM | POA: Diagnosis not present

## 2011-11-07 DIAGNOSIS — IMO0001 Reserved for inherently not codable concepts without codable children: Secondary | ICD-10-CM | POA: Diagnosis not present

## 2011-11-07 DIAGNOSIS — Z96659 Presence of unspecified artificial knee joint: Secondary | ICD-10-CM | POA: Diagnosis not present

## 2011-11-08 DIAGNOSIS — Z471 Aftercare following joint replacement surgery: Secondary | ICD-10-CM | POA: Diagnosis not present

## 2011-11-08 DIAGNOSIS — Z96659 Presence of unspecified artificial knee joint: Secondary | ICD-10-CM | POA: Diagnosis not present

## 2011-11-08 DIAGNOSIS — IMO0001 Reserved for inherently not codable concepts without codable children: Secondary | ICD-10-CM | POA: Diagnosis not present

## 2011-11-08 DIAGNOSIS — M5137 Other intervertebral disc degeneration, lumbosacral region: Secondary | ICD-10-CM | POA: Diagnosis not present

## 2011-11-08 DIAGNOSIS — R262 Difficulty in walking, not elsewhere classified: Secondary | ICD-10-CM | POA: Diagnosis not present

## 2011-11-11 DIAGNOSIS — Z471 Aftercare following joint replacement surgery: Secondary | ICD-10-CM | POA: Diagnosis not present

## 2011-11-11 DIAGNOSIS — IMO0001 Reserved for inherently not codable concepts without codable children: Secondary | ICD-10-CM | POA: Diagnosis not present

## 2011-11-11 DIAGNOSIS — R262 Difficulty in walking, not elsewhere classified: Secondary | ICD-10-CM | POA: Diagnosis not present

## 2011-11-11 DIAGNOSIS — Z96659 Presence of unspecified artificial knee joint: Secondary | ICD-10-CM | POA: Diagnosis not present

## 2011-11-11 DIAGNOSIS — M5137 Other intervertebral disc degeneration, lumbosacral region: Secondary | ICD-10-CM | POA: Diagnosis not present

## 2011-11-12 DIAGNOSIS — Z96659 Presence of unspecified artificial knee joint: Secondary | ICD-10-CM | POA: Diagnosis not present

## 2011-11-12 DIAGNOSIS — Z471 Aftercare following joint replacement surgery: Secondary | ICD-10-CM | POA: Diagnosis not present

## 2011-11-12 DIAGNOSIS — R262 Difficulty in walking, not elsewhere classified: Secondary | ICD-10-CM | POA: Diagnosis not present

## 2011-11-12 DIAGNOSIS — IMO0001 Reserved for inherently not codable concepts without codable children: Secondary | ICD-10-CM | POA: Diagnosis not present

## 2011-11-12 DIAGNOSIS — M5137 Other intervertebral disc degeneration, lumbosacral region: Secondary | ICD-10-CM | POA: Diagnosis not present

## 2011-11-13 DIAGNOSIS — Z471 Aftercare following joint replacement surgery: Secondary | ICD-10-CM | POA: Diagnosis not present

## 2011-11-13 DIAGNOSIS — R262 Difficulty in walking, not elsewhere classified: Secondary | ICD-10-CM | POA: Diagnosis not present

## 2011-11-13 DIAGNOSIS — Z96659 Presence of unspecified artificial knee joint: Secondary | ICD-10-CM | POA: Diagnosis not present

## 2011-11-13 DIAGNOSIS — M5137 Other intervertebral disc degeneration, lumbosacral region: Secondary | ICD-10-CM | POA: Diagnosis not present

## 2011-11-13 DIAGNOSIS — IMO0001 Reserved for inherently not codable concepts without codable children: Secondary | ICD-10-CM | POA: Diagnosis not present

## 2011-11-14 DIAGNOSIS — Z471 Aftercare following joint replacement surgery: Secondary | ICD-10-CM | POA: Diagnosis not present

## 2011-11-14 DIAGNOSIS — R262 Difficulty in walking, not elsewhere classified: Secondary | ICD-10-CM | POA: Diagnosis not present

## 2011-11-14 DIAGNOSIS — IMO0001 Reserved for inherently not codable concepts without codable children: Secondary | ICD-10-CM | POA: Diagnosis not present

## 2011-11-14 DIAGNOSIS — Z96659 Presence of unspecified artificial knee joint: Secondary | ICD-10-CM | POA: Diagnosis not present

## 2011-11-14 DIAGNOSIS — M5137 Other intervertebral disc degeneration, lumbosacral region: Secondary | ICD-10-CM | POA: Diagnosis not present

## 2011-11-17 DIAGNOSIS — Z471 Aftercare following joint replacement surgery: Secondary | ICD-10-CM | POA: Diagnosis not present

## 2011-11-17 DIAGNOSIS — IMO0001 Reserved for inherently not codable concepts without codable children: Secondary | ICD-10-CM | POA: Diagnosis not present

## 2011-11-17 DIAGNOSIS — R262 Difficulty in walking, not elsewhere classified: Secondary | ICD-10-CM | POA: Diagnosis not present

## 2011-11-17 DIAGNOSIS — M5137 Other intervertebral disc degeneration, lumbosacral region: Secondary | ICD-10-CM | POA: Diagnosis not present

## 2011-11-17 DIAGNOSIS — Z96659 Presence of unspecified artificial knee joint: Secondary | ICD-10-CM | POA: Diagnosis not present

## 2011-11-18 DIAGNOSIS — Z96659 Presence of unspecified artificial knee joint: Secondary | ICD-10-CM | POA: Diagnosis not present

## 2011-11-18 DIAGNOSIS — Z471 Aftercare following joint replacement surgery: Secondary | ICD-10-CM | POA: Diagnosis not present

## 2011-11-18 DIAGNOSIS — R262 Difficulty in walking, not elsewhere classified: Secondary | ICD-10-CM | POA: Diagnosis not present

## 2011-11-18 DIAGNOSIS — IMO0001 Reserved for inherently not codable concepts without codable children: Secondary | ICD-10-CM | POA: Diagnosis not present

## 2011-11-18 DIAGNOSIS — M5137 Other intervertebral disc degeneration, lumbosacral region: Secondary | ICD-10-CM | POA: Diagnosis not present

## 2011-11-20 DIAGNOSIS — Z471 Aftercare following joint replacement surgery: Secondary | ICD-10-CM | POA: Diagnosis not present

## 2011-11-20 DIAGNOSIS — R262 Difficulty in walking, not elsewhere classified: Secondary | ICD-10-CM | POA: Diagnosis not present

## 2011-11-20 DIAGNOSIS — M5137 Other intervertebral disc degeneration, lumbosacral region: Secondary | ICD-10-CM | POA: Diagnosis not present

## 2011-11-20 DIAGNOSIS — IMO0001 Reserved for inherently not codable concepts without codable children: Secondary | ICD-10-CM | POA: Diagnosis not present

## 2011-11-20 DIAGNOSIS — Z96659 Presence of unspecified artificial knee joint: Secondary | ICD-10-CM | POA: Diagnosis not present

## 2011-11-21 DIAGNOSIS — R262 Difficulty in walking, not elsewhere classified: Secondary | ICD-10-CM | POA: Diagnosis not present

## 2011-11-21 DIAGNOSIS — M1710 Unilateral primary osteoarthritis, unspecified knee: Secondary | ICD-10-CM | POA: Diagnosis not present

## 2011-11-24 DIAGNOSIS — M1710 Unilateral primary osteoarthritis, unspecified knee: Secondary | ICD-10-CM | POA: Diagnosis not present

## 2011-11-24 DIAGNOSIS — R262 Difficulty in walking, not elsewhere classified: Secondary | ICD-10-CM | POA: Diagnosis not present

## 2011-11-26 DIAGNOSIS — M1710 Unilateral primary osteoarthritis, unspecified knee: Secondary | ICD-10-CM | POA: Diagnosis not present

## 2011-11-26 DIAGNOSIS — R262 Difficulty in walking, not elsewhere classified: Secondary | ICD-10-CM | POA: Diagnosis not present

## 2011-11-28 DIAGNOSIS — R262 Difficulty in walking, not elsewhere classified: Secondary | ICD-10-CM | POA: Diagnosis not present

## 2011-11-28 DIAGNOSIS — M1710 Unilateral primary osteoarthritis, unspecified knee: Secondary | ICD-10-CM | POA: Diagnosis not present

## 2011-12-01 DIAGNOSIS — R262 Difficulty in walking, not elsewhere classified: Secondary | ICD-10-CM | POA: Diagnosis not present

## 2011-12-01 DIAGNOSIS — M1710 Unilateral primary osteoarthritis, unspecified knee: Secondary | ICD-10-CM | POA: Diagnosis not present

## 2011-12-03 DIAGNOSIS — M1710 Unilateral primary osteoarthritis, unspecified knee: Secondary | ICD-10-CM | POA: Diagnosis not present

## 2011-12-03 DIAGNOSIS — R262 Difficulty in walking, not elsewhere classified: Secondary | ICD-10-CM | POA: Diagnosis not present

## 2011-12-04 DIAGNOSIS — N3941 Urge incontinence: Secondary | ICD-10-CM | POA: Diagnosis not present

## 2011-12-04 DIAGNOSIS — R350 Frequency of micturition: Secondary | ICD-10-CM | POA: Diagnosis not present

## 2011-12-05 DIAGNOSIS — R262 Difficulty in walking, not elsewhere classified: Secondary | ICD-10-CM | POA: Diagnosis not present

## 2011-12-05 DIAGNOSIS — M1710 Unilateral primary osteoarthritis, unspecified knee: Secondary | ICD-10-CM | POA: Diagnosis not present

## 2011-12-08 DIAGNOSIS — M1710 Unilateral primary osteoarthritis, unspecified knee: Secondary | ICD-10-CM | POA: Diagnosis not present

## 2011-12-08 DIAGNOSIS — R262 Difficulty in walking, not elsewhere classified: Secondary | ICD-10-CM | POA: Diagnosis not present

## 2011-12-09 DIAGNOSIS — R262 Difficulty in walking, not elsewhere classified: Secondary | ICD-10-CM | POA: Diagnosis not present

## 2011-12-09 DIAGNOSIS — M1710 Unilateral primary osteoarthritis, unspecified knee: Secondary | ICD-10-CM | POA: Diagnosis not present

## 2011-12-10 DIAGNOSIS — M1710 Unilateral primary osteoarthritis, unspecified knee: Secondary | ICD-10-CM | POA: Diagnosis not present

## 2011-12-10 DIAGNOSIS — R262 Difficulty in walking, not elsewhere classified: Secondary | ICD-10-CM | POA: Diagnosis not present

## 2011-12-15 DIAGNOSIS — R262 Difficulty in walking, not elsewhere classified: Secondary | ICD-10-CM | POA: Diagnosis not present

## 2011-12-15 DIAGNOSIS — M1710 Unilateral primary osteoarthritis, unspecified knee: Secondary | ICD-10-CM | POA: Diagnosis not present

## 2011-12-15 DIAGNOSIS — I10 Essential (primary) hypertension: Secondary | ICD-10-CM | POA: Diagnosis not present

## 2011-12-15 DIAGNOSIS — E7800 Pure hypercholesterolemia, unspecified: Secondary | ICD-10-CM | POA: Diagnosis not present

## 2011-12-17 DIAGNOSIS — M1710 Unilateral primary osteoarthritis, unspecified knee: Secondary | ICD-10-CM | POA: Diagnosis not present

## 2011-12-17 DIAGNOSIS — R262 Difficulty in walking, not elsewhere classified: Secondary | ICD-10-CM | POA: Diagnosis not present

## 2011-12-18 DIAGNOSIS — M67919 Unspecified disorder of synovium and tendon, unspecified shoulder: Secondary | ICD-10-CM | POA: Diagnosis not present

## 2011-12-18 DIAGNOSIS — M719 Bursopathy, unspecified: Secondary | ICD-10-CM | POA: Diagnosis not present

## 2011-12-22 DIAGNOSIS — I10 Essential (primary) hypertension: Secondary | ICD-10-CM | POA: Diagnosis not present

## 2011-12-22 DIAGNOSIS — E7800 Pure hypercholesterolemia, unspecified: Secondary | ICD-10-CM | POA: Diagnosis not present

## 2011-12-22 DIAGNOSIS — M1990 Unspecified osteoarthritis, unspecified site: Secondary | ICD-10-CM | POA: Diagnosis not present

## 2011-12-22 DIAGNOSIS — F411 Generalized anxiety disorder: Secondary | ICD-10-CM | POA: Diagnosis not present

## 2011-12-30 DIAGNOSIS — R262 Difficulty in walking, not elsewhere classified: Secondary | ICD-10-CM | POA: Diagnosis not present

## 2011-12-30 DIAGNOSIS — M1710 Unilateral primary osteoarthritis, unspecified knee: Secondary | ICD-10-CM | POA: Diagnosis not present

## 2011-12-31 DIAGNOSIS — M1710 Unilateral primary osteoarthritis, unspecified knee: Secondary | ICD-10-CM | POA: Diagnosis not present

## 2011-12-31 DIAGNOSIS — R262 Difficulty in walking, not elsewhere classified: Secondary | ICD-10-CM | POA: Diagnosis not present

## 2012-01-01 DIAGNOSIS — R350 Frequency of micturition: Secondary | ICD-10-CM | POA: Diagnosis not present

## 2012-01-01 DIAGNOSIS — N3941 Urge incontinence: Secondary | ICD-10-CM | POA: Diagnosis not present

## 2012-01-02 DIAGNOSIS — R262 Difficulty in walking, not elsewhere classified: Secondary | ICD-10-CM | POA: Diagnosis not present

## 2012-01-02 DIAGNOSIS — M1710 Unilateral primary osteoarthritis, unspecified knee: Secondary | ICD-10-CM | POA: Diagnosis not present

## 2012-01-29 DIAGNOSIS — N3020 Other chronic cystitis without hematuria: Secondary | ICD-10-CM | POA: Diagnosis not present

## 2012-04-08 DIAGNOSIS — N3941 Urge incontinence: Secondary | ICD-10-CM | POA: Diagnosis not present

## 2012-04-08 DIAGNOSIS — R350 Frequency of micturition: Secondary | ICD-10-CM | POA: Diagnosis not present

## 2012-04-22 DIAGNOSIS — M1710 Unilateral primary osteoarthritis, unspecified knee: Secondary | ICD-10-CM | POA: Diagnosis not present

## 2012-04-29 DIAGNOSIS — R350 Frequency of micturition: Secondary | ICD-10-CM | POA: Diagnosis not present

## 2012-04-29 DIAGNOSIS — N3941 Urge incontinence: Secondary | ICD-10-CM | POA: Diagnosis not present

## 2012-05-21 DIAGNOSIS — N3941 Urge incontinence: Secondary | ICD-10-CM | POA: Diagnosis not present

## 2012-05-25 DIAGNOSIS — C50919 Malignant neoplasm of unspecified site of unspecified female breast: Secondary | ICD-10-CM | POA: Diagnosis not present

## 2012-05-25 DIAGNOSIS — Z9889 Other specified postprocedural states: Secondary | ICD-10-CM | POA: Diagnosis not present

## 2012-05-25 DIAGNOSIS — R928 Other abnormal and inconclusive findings on diagnostic imaging of breast: Secondary | ICD-10-CM | POA: Diagnosis not present

## 2012-06-10 DIAGNOSIS — R350 Frequency of micturition: Secondary | ICD-10-CM | POA: Diagnosis not present

## 2012-06-10 DIAGNOSIS — N3941 Urge incontinence: Secondary | ICD-10-CM | POA: Diagnosis not present

## 2012-06-23 DIAGNOSIS — F411 Generalized anxiety disorder: Secondary | ICD-10-CM | POA: Diagnosis not present

## 2012-06-23 DIAGNOSIS — M545 Low back pain: Secondary | ICD-10-CM | POA: Diagnosis not present

## 2012-06-23 DIAGNOSIS — M1990 Unspecified osteoarthritis, unspecified site: Secondary | ICD-10-CM | POA: Diagnosis not present

## 2012-06-23 DIAGNOSIS — I10 Essential (primary) hypertension: Secondary | ICD-10-CM | POA: Diagnosis not present

## 2012-06-23 DIAGNOSIS — E7800 Pure hypercholesterolemia, unspecified: Secondary | ICD-10-CM | POA: Diagnosis not present

## 2012-06-24 ENCOUNTER — Encounter: Payer: Medicare Other | Admitting: Internal Medicine

## 2012-06-24 DIAGNOSIS — M949 Disorder of cartilage, unspecified: Secondary | ICD-10-CM | POA: Diagnosis not present

## 2012-06-24 DIAGNOSIS — M899 Disorder of bone, unspecified: Secondary | ICD-10-CM | POA: Diagnosis not present

## 2012-06-24 DIAGNOSIS — Z853 Personal history of malignant neoplasm of breast: Secondary | ICD-10-CM

## 2012-07-02 DIAGNOSIS — N3941 Urge incontinence: Secondary | ICD-10-CM | POA: Diagnosis not present

## 2012-07-02 DIAGNOSIS — R350 Frequency of micturition: Secondary | ICD-10-CM | POA: Diagnosis not present

## 2012-07-02 DIAGNOSIS — N390 Urinary tract infection, site not specified: Secondary | ICD-10-CM | POA: Diagnosis not present

## 2012-07-13 DIAGNOSIS — N390 Urinary tract infection, site not specified: Secondary | ICD-10-CM | POA: Diagnosis not present

## 2012-08-12 DIAGNOSIS — N3941 Urge incontinence: Secondary | ICD-10-CM | POA: Diagnosis not present

## 2012-08-12 DIAGNOSIS — R350 Frequency of micturition: Secondary | ICD-10-CM | POA: Diagnosis not present

## 2012-09-02 DIAGNOSIS — L538 Other specified erythematous conditions: Secondary | ICD-10-CM | POA: Diagnosis not present

## 2012-09-02 DIAGNOSIS — L821 Other seborrheic keratosis: Secondary | ICD-10-CM | POA: Diagnosis not present

## 2012-09-02 DIAGNOSIS — Z85820 Personal history of malignant melanoma of skin: Secondary | ICD-10-CM | POA: Diagnosis not present

## 2012-09-02 DIAGNOSIS — D235 Other benign neoplasm of skin of trunk: Secondary | ICD-10-CM | POA: Diagnosis not present

## 2012-09-02 DIAGNOSIS — N3941 Urge incontinence: Secondary | ICD-10-CM | POA: Diagnosis not present

## 2012-09-02 DIAGNOSIS — R350 Frequency of micturition: Secondary | ICD-10-CM | POA: Diagnosis not present

## 2012-09-23 DIAGNOSIS — N3941 Urge incontinence: Secondary | ICD-10-CM | POA: Diagnosis not present

## 2012-09-28 DIAGNOSIS — Z961 Presence of intraocular lens: Secondary | ICD-10-CM | POA: Diagnosis not present

## 2012-09-28 DIAGNOSIS — H52229 Regular astigmatism, unspecified eye: Secondary | ICD-10-CM | POA: Diagnosis not present

## 2012-09-28 DIAGNOSIS — H5210 Myopia, unspecified eye: Secondary | ICD-10-CM | POA: Diagnosis not present

## 2012-09-28 DIAGNOSIS — H524 Presbyopia: Secondary | ICD-10-CM | POA: Diagnosis not present

## 2012-10-14 DIAGNOSIS — R350 Frequency of micturition: Secondary | ICD-10-CM | POA: Diagnosis not present

## 2012-10-14 DIAGNOSIS — N3941 Urge incontinence: Secondary | ICD-10-CM | POA: Diagnosis not present

## 2012-11-02 DIAGNOSIS — M1710 Unilateral primary osteoarthritis, unspecified knee: Secondary | ICD-10-CM | POA: Diagnosis not present

## 2012-11-04 DIAGNOSIS — R350 Frequency of micturition: Secondary | ICD-10-CM | POA: Diagnosis not present

## 2012-11-04 DIAGNOSIS — N3941 Urge incontinence: Secondary | ICD-10-CM | POA: Diagnosis not present

## 2012-12-15 DIAGNOSIS — I10 Essential (primary) hypertension: Secondary | ICD-10-CM | POA: Diagnosis not present

## 2012-12-15 DIAGNOSIS — E7800 Pure hypercholesterolemia, unspecified: Secondary | ICD-10-CM | POA: Diagnosis not present

## 2012-12-22 DIAGNOSIS — M1990 Unspecified osteoarthritis, unspecified site: Secondary | ICD-10-CM | POA: Diagnosis not present

## 2012-12-22 DIAGNOSIS — E7800 Pure hypercholesterolemia, unspecified: Secondary | ICD-10-CM | POA: Diagnosis not present

## 2012-12-22 DIAGNOSIS — M545 Low back pain: Secondary | ICD-10-CM | POA: Diagnosis not present

## 2012-12-22 DIAGNOSIS — I10 Essential (primary) hypertension: Secondary | ICD-10-CM | POA: Diagnosis not present

## 2012-12-22 DIAGNOSIS — F411 Generalized anxiety disorder: Secondary | ICD-10-CM | POA: Diagnosis not present

## 2013-01-13 DIAGNOSIS — K52839 Microscopic colitis, unspecified: Secondary | ICD-10-CM

## 2013-01-13 HISTORY — DX: Microscopic colitis, unspecified: K52.839

## 2013-04-13 DIAGNOSIS — M545 Low back pain, unspecified: Secondary | ICD-10-CM | POA: Diagnosis not present

## 2013-04-13 DIAGNOSIS — M25559 Pain in unspecified hip: Secondary | ICD-10-CM | POA: Diagnosis not present

## 2013-04-13 DIAGNOSIS — M169 Osteoarthritis of hip, unspecified: Secondary | ICD-10-CM | POA: Diagnosis not present

## 2013-04-13 DIAGNOSIS — M1610 Unilateral primary osteoarthritis, unspecified hip: Secondary | ICD-10-CM | POA: Diagnosis not present

## 2013-04-13 DIAGNOSIS — Z85820 Personal history of malignant melanoma of skin: Secondary | ICD-10-CM | POA: Diagnosis not present

## 2013-04-13 DIAGNOSIS — M47817 Spondylosis without myelopathy or radiculopathy, lumbosacral region: Secondary | ICD-10-CM | POA: Diagnosis not present

## 2013-04-13 DIAGNOSIS — M5137 Other intervertebral disc degeneration, lumbosacral region: Secondary | ICD-10-CM | POA: Diagnosis not present

## 2013-04-13 DIAGNOSIS — Z853 Personal history of malignant neoplasm of breast: Secondary | ICD-10-CM | POA: Diagnosis not present

## 2013-04-13 DIAGNOSIS — M51379 Other intervertebral disc degeneration, lumbosacral region without mention of lumbar back pain or lower extremity pain: Secondary | ICD-10-CM | POA: Diagnosis not present

## 2013-05-31 DIAGNOSIS — R928 Other abnormal and inconclusive findings on diagnostic imaging of breast: Secondary | ICD-10-CM | POA: Diagnosis not present

## 2013-05-31 DIAGNOSIS — C50919 Malignant neoplasm of unspecified site of unspecified female breast: Secondary | ICD-10-CM | POA: Diagnosis not present

## 2013-05-31 DIAGNOSIS — Z9889 Other specified postprocedural states: Secondary | ICD-10-CM | POA: Diagnosis not present

## 2013-06-15 DIAGNOSIS — H52229 Regular astigmatism, unspecified eye: Secondary | ICD-10-CM | POA: Diagnosis not present

## 2013-06-15 DIAGNOSIS — H2510 Age-related nuclear cataract, unspecified eye: Secondary | ICD-10-CM | POA: Diagnosis not present

## 2013-06-15 DIAGNOSIS — H5210 Myopia, unspecified eye: Secondary | ICD-10-CM | POA: Diagnosis not present

## 2013-06-15 DIAGNOSIS — H524 Presbyopia: Secondary | ICD-10-CM | POA: Diagnosis not present

## 2013-06-22 DIAGNOSIS — F411 Generalized anxiety disorder: Secondary | ICD-10-CM | POA: Diagnosis not present

## 2013-06-22 DIAGNOSIS — M545 Low back pain, unspecified: Secondary | ICD-10-CM | POA: Diagnosis not present

## 2013-06-22 DIAGNOSIS — E7800 Pure hypercholesterolemia, unspecified: Secondary | ICD-10-CM | POA: Diagnosis not present

## 2013-06-22 DIAGNOSIS — D0590 Unspecified type of carcinoma in situ of unspecified breast: Secondary | ICD-10-CM | POA: Diagnosis not present

## 2013-06-22 DIAGNOSIS — M76899 Other specified enthesopathies of unspecified lower limb, excluding foot: Secondary | ICD-10-CM | POA: Diagnosis not present

## 2013-06-22 DIAGNOSIS — M899 Disorder of bone, unspecified: Secondary | ICD-10-CM | POA: Diagnosis not present

## 2013-06-22 DIAGNOSIS — M1990 Unspecified osteoarthritis, unspecified site: Secondary | ICD-10-CM | POA: Diagnosis not present

## 2013-06-22 DIAGNOSIS — I10 Essential (primary) hypertension: Secondary | ICD-10-CM | POA: Diagnosis not present

## 2013-06-22 DIAGNOSIS — M949 Disorder of cartilage, unspecified: Secondary | ICD-10-CM | POA: Diagnosis not present

## 2013-08-11 ENCOUNTER — Encounter (HOSPITAL_COMMUNITY): Payer: Self-pay | Admitting: Pharmacy Technician

## 2013-08-11 DIAGNOSIS — Z961 Presence of intraocular lens: Secondary | ICD-10-CM | POA: Diagnosis not present

## 2013-08-11 DIAGNOSIS — H5210 Myopia, unspecified eye: Secondary | ICD-10-CM | POA: Diagnosis not present

## 2013-08-11 DIAGNOSIS — H2589 Other age-related cataract: Secondary | ICD-10-CM | POA: Diagnosis not present

## 2013-08-11 DIAGNOSIS — H35379 Puckering of macula, unspecified eye: Secondary | ICD-10-CM | POA: Diagnosis not present

## 2013-08-12 ENCOUNTER — Other Ambulatory Visit: Payer: Medicare Other

## 2013-08-12 ENCOUNTER — Encounter (HOSPITAL_COMMUNITY): Payer: Self-pay

## 2013-08-12 ENCOUNTER — Encounter (HOSPITAL_COMMUNITY)
Admission: RE | Admit: 2013-08-12 | Discharge: 2013-08-12 | Disposition: A | Discharge status: Home / Self Care | Payer: Medicare Other | Source: Ambulatory Visit | Attending: Ophthalmology | Admitting: Ophthalmology

## 2013-08-12 DIAGNOSIS — Z01812 Encounter for preprocedural laboratory examination: Secondary | ICD-10-CM | POA: Diagnosis not present

## 2013-08-12 DIAGNOSIS — Z01810 Encounter for preprocedural cardiovascular examination: Secondary | ICD-10-CM | POA: Diagnosis not present

## 2013-08-12 DIAGNOSIS — Z01818 Encounter for other preprocedural examination: Secondary | ICD-10-CM | POA: Diagnosis not present

## 2013-08-12 HISTORY — DX: Pure hypercholesterolemia, unspecified: E78.00

## 2013-08-12 LAB — BASIC METABOLIC PANEL
Anion gap: 10 (ref 5–15)
BUN: 21 mg/dL (ref 6–23)
CO2: 32 mEq/L (ref 19–32)
Calcium: 9.4 mg/dL (ref 8.4–10.5)
Chloride: 103 mEq/L (ref 96–112)
Creatinine, Ser: 0.82 mg/dL (ref 0.50–1.10)
GFR calc Af Amer: 82 mL/min — ABNORMAL LOW (ref >90)
GFR, EST NON AFRICAN AMERICAN: 71 mL/min — AB (ref >90)
Glucose, Bld: 118 mg/dL — ABNORMAL HIGH (ref 70–99)
POTASSIUM: 3.8 meq/L (ref 3.7–5.3)
SODIUM: 145 meq/L (ref 137–147)

## 2013-08-12 LAB — HEMOGLOBIN AND HEMATOCRIT, BLOOD
HCT: 42.9 % (ref 36.0–46.0)
HEMOGLOBIN: 14.8 g/dL (ref 12.0–15.0)

## 2013-08-12 NOTE — Pre-Procedure Instructions (Signed)
Patient given information to sign up for my chart at home. 

## 2013-08-12 NOTE — Patient Instructions (Signed)
Your procedure is scheduled on: 08/18/2013  Report to [facility] at  47  AM.  Call this number if you have problems the morning of surgery: [phone]   Do not eat food or drink liquids :After Midnight.      Take these medicines the morning of surgery with A SIP OF WATER: none   Do not wear jewelry, make-up or nail polish.  Do not wear lotions, powders, or perfumes.   Do not shave 48 hours prior to surgery.  Do not bring valuables to the hospital.  Contacts, dentures or bridgework may not be worn into surgery.  Leave suitcase in the car. After surgery it may be brought to your room.  For patients admitted to the hospital, checkout time is 11:00 AM the day of discharge.   Patients discharged the day of surgery will not be allowed to drive home.  :     Please read over the following fact sheets that you were given: Coughing and Deep Breathing, Surgical Site Infection Prevention, Anesthesia Post-op Instructions and Care and Recovery After Surgery    Cataract A cataract is a clouding of the lens of the eye. When a lens becomes cloudy, vision is reduced based on the degree and nature of the clouding. Many cataracts reduce vision to some degree. Some cataracts make people more near-sighted as they develop. Other cataracts increase glare. Cataracts that are ignored and become worse can sometimes look white. The white color can be seen through the pupil. CAUSES   Aging. However, cataracts may occur at any age, even in newborns.   Certain drugs.   Trauma to the eye.   Certain diseases such as diabetes.   Specific eye diseases such as chronic inflammation inside the eye or a sudden attack of a rare form of glaucoma.   Inherited or acquired medical problems.  SYMPTOMS   Gradual, progressive drop in vision in the affected eye.   Severe, rapid visual loss. This most often happens when trauma is the cause.  DIAGNOSIS  To detect a cataract, an eye doctor examines the lens. Cataracts are  best diagnosed with an exam of the eyes with the pupils enlarged (dilated) by drops.  TREATMENT  For an early cataract, vision may improve by using different eyeglasses or stronger lighting. If that does not help your vision, surgery is the only effective treatment. A cataract needs to be surgically removed when vision loss interferes with your everyday activities, such as driving, reading, or watching TV. A cataract may also have to be removed if it prevents examination or treatment of another eye problem. Surgery removes the cloudy lens and usually replaces it with a substitute lens (intraocular lens, IOL).  At a time when both you and your doctor agree, the cataract will be surgically removed. If you have cataracts in both eyes, only one is usually removed at a time. This allows the operated eye to heal and be out of danger from any possible problems after surgery (such as infection or poor wound healing). In rare cases, a cataract may be doing damage to your eye. In these cases, your caregiver may advise surgical removal right away. The vast majority of people who have cataract surgery have better vision afterward. HOME CARE INSTRUCTIONS  If you are not planning surgery, you may be asked to do the following:  Use different eyeglasses.   Use stronger or brighter lighting.   Ask your eye doctor about reducing your medicine dose or changing medicines if  it is thought that a medicine caused your cataract. Changing medicines does not make the cataract go away on its own.   Become familiar with your surroundings. Poor vision can lead to injury. Avoid bumping into things on the affected side. You are at a higher risk for tripping or falling.   Exercise extreme care when driving or operating machinery.   Wear sunglasses if you are sensitive to bright light or experiencing problems with glare.  SEEK IMMEDIATE MEDICAL CARE IF:   You have a worsening or sudden vision loss.   You notice redness,  swelling, or increasing pain in the eye.   You have a fever.  Document Released: 12/30/2004 Document Revised: 12/19/2010 Document Reviewed: 08/23/2010 [facility] Patient Information 2012 [place].PATIENT INSTRUCTIONS POST-ANESTHESIA  IMMEDIATELY FOLLOWING SURGERY:  Do not drive or operate machinery for the first twenty four hours after surgery.  Do not make any important decisions for twenty four hours after surgery or while taking narcotic pain medications or sedatives.  If you develop intractable nausea and vomiting or a severe headache please notify your doctor immediately.  FOLLOW-UP:  Please make an appointment with your surgeon as instructed. You do not need to follow up with anesthesia unless specifically instructed to do so.  WOUND CARE INSTRUCTIONS (if applicable):  Keep a dry clean dressing on the anesthesia/puncture wound site if there is drainage.  Once the wound has quit draining you may leave it open to air.  Generally you should leave the bandage intact for twenty four hours unless there is drainage.  If the epidural site drains for more than 36-48 hours please call the anesthesia department.  QUESTIONS?:  Please feel free to call your physician or the hospital operator if you have any questions, and they will be happy to assist you.

## 2013-08-12 NOTE — Progress Notes (Signed)
08/12/13 1256  OBSTRUCTIVE SLEEP APNEA  Have you ever been diagnosed with sleep apnea through a sleep study? No  Do you snore loudly (loud enough to be heard through closed doors)?  1  Do you often feel tired, fatigued, or sleepy during the daytime? 0  Has anyone observed you stop breathing during your sleep? 0  Do you have, or are you being treated for high blood pressure? 1  BMI more than 35 kg/m2? 1  Age over 70 years old? 1  Neck circumference greater than 40 cm/16 inches? 0  Gender: 0  Obstructive Sleep Apnea Score 4  Score 4 or greater  Results sent to PCP

## 2013-08-17 MED ORDER — TETRACAINE HCL 0.5 % OP SOLN
OPHTHALMIC | Status: AC
  Filled 2013-08-17: qty 2

## 2013-08-17 MED ORDER — CYCLOPENTOLATE-PHENYLEPHRINE OP SOLN OPTIME - NO CHARGE
OPHTHALMIC | Status: AC
  Filled 2013-08-17: qty 2

## 2013-08-17 MED ORDER — LIDOCAINE HCL 3.5 % OP GEL
OPHTHALMIC | Status: AC
  Filled 2013-08-17: qty 1

## 2013-08-17 MED ORDER — LIDOCAINE HCL (PF) 1 % IJ SOLN
INTRAMUSCULAR | Status: AC
  Filled 2013-08-17: qty 2

## 2013-08-17 MED ORDER — NEOMYCIN-POLYMYXIN-DEXAMETH 3.5-10000-0.1 OP SUSP
OPHTHALMIC | Status: AC
  Filled 2013-08-17: qty 5

## 2013-08-17 MED ORDER — PHENYLEPHRINE HCL 2.5 % OP SOLN
OPHTHALMIC | Status: AC
  Filled 2013-08-17: qty 15

## 2013-08-18 ENCOUNTER — Ambulatory Visit (HOSPITAL_COMMUNITY)
Admission: RE | Admit: 2013-08-18 | Discharge: 2013-08-18 | Disposition: A | Discharge status: Home / Self Care | Payer: Medicare Other | Source: Ambulatory Visit | Attending: Ophthalmology | Admitting: Ophthalmology

## 2013-08-18 ENCOUNTER — Encounter (HOSPITAL_COMMUNITY)
Admission: RE | Disposition: A | Discharge status: Home / Self Care | Payer: Self-pay | Source: Ambulatory Visit | Attending: Ophthalmology

## 2013-08-18 ENCOUNTER — Ambulatory Visit (HOSPITAL_COMMUNITY): Payer: Medicare Other | Admitting: Anesthesiology

## 2013-08-18 ENCOUNTER — Encounter (HOSPITAL_COMMUNITY): Payer: Self-pay | Admitting: *Deleted

## 2013-08-18 ENCOUNTER — Encounter (HOSPITAL_COMMUNITY): Payer: Medicare Other | Admitting: Anesthesiology

## 2013-08-18 DIAGNOSIS — I10 Essential (primary) hypertension: Secondary | ICD-10-CM | POA: Diagnosis not present

## 2013-08-18 DIAGNOSIS — Z79899 Other long term (current) drug therapy: Secondary | ICD-10-CM | POA: Insufficient documentation

## 2013-08-18 DIAGNOSIS — F411 Generalized anxiety disorder: Secondary | ICD-10-CM | POA: Diagnosis not present

## 2013-08-18 DIAGNOSIS — H2589 Other age-related cataract: Secondary | ICD-10-CM | POA: Diagnosis not present

## 2013-08-18 DIAGNOSIS — H259 Unspecified age-related cataract: Secondary | ICD-10-CM | POA: Diagnosis not present

## 2013-08-18 HISTORY — PX: CATARACT EXTRACTION W/PHACO: SHX586

## 2013-08-18 SURGERY — PHACOEMULSIFICATION, CATARACT, WITH IOL INSERTION
Anesthesia: Monitor Anesthesia Care | Site: Eye | Laterality: Left

## 2013-08-18 MED ORDER — LIDOCAINE HCL 3.5 % OP GEL
1.0000 "application " | Freq: Once | OPHTHALMIC | Status: AC
  Administered 2013-08-18: 1 via OPHTHALMIC

## 2013-08-18 MED ORDER — CYCLOPENTOLATE-PHENYLEPHRINE 0.2-1 % OP SOLN
1.0000 [drp] | OPHTHALMIC | Status: AC
  Administered 2013-08-18 (×3): 1 [drp] via OPHTHALMIC

## 2013-08-18 MED ORDER — PROVISC 10 MG/ML IO SOLN
INTRAOCULAR | Status: DC | PRN
  Administered 2013-08-18: 0.85 mL via INTRAOCULAR

## 2013-08-18 MED ORDER — LIDOCAINE 3.5 % OP GEL OPTIME - NO CHARGE
OPHTHALMIC | Status: DC | PRN
  Administered 2013-08-18: 1 [drp] via OPHTHALMIC

## 2013-08-18 MED ORDER — EPINEPHRINE HCL 1 MG/ML IJ SOLN
INTRAMUSCULAR | Status: AC
  Filled 2013-08-18: qty 1

## 2013-08-18 MED ORDER — POVIDONE-IODINE 5 % OP SOLN
OPHTHALMIC | Status: DC | PRN
  Administered 2013-08-18: 1 via OPHTHALMIC

## 2013-08-18 MED ORDER — LIDOCAINE HCL (PF) 1 % IJ SOLN
INTRAOCULAR | Status: DC | PRN
  Administered 2013-08-18: 10:00:00 via OPHTHALMIC

## 2013-08-18 MED ORDER — BSS IO SOLN
INTRAOCULAR | Status: DC | PRN
  Administered 2013-08-18: 15 mL via INTRAOCULAR

## 2013-08-18 MED ORDER — MIDAZOLAM HCL 2 MG/2ML IJ SOLN
1.0000 mg | INTRAMUSCULAR | Status: DC | PRN
  Administered 2013-08-18: 2 mg via INTRAVENOUS

## 2013-08-18 MED ORDER — LACTATED RINGERS IV SOLN
INTRAVENOUS | Status: DC
  Administered 2013-08-18: 10:00:00 via INTRAVENOUS

## 2013-08-18 MED ORDER — MIDAZOLAM HCL 2 MG/2ML IJ SOLN
INTRAMUSCULAR | Status: AC
  Filled 2013-08-18: qty 2

## 2013-08-18 MED ORDER — FENTANYL CITRATE 0.05 MG/ML IJ SOLN
INTRAMUSCULAR | Status: AC
  Filled 2013-08-18: qty 2

## 2013-08-18 MED ORDER — TETRACAINE HCL 0.5 % OP SOLN
1.0000 [drp] | OPHTHALMIC | Status: AC
  Administered 2013-08-18 (×3): 1 [drp] via OPHTHALMIC

## 2013-08-18 MED ORDER — FENTANYL CITRATE 0.05 MG/ML IJ SOLN
25.0000 ug | INTRAMUSCULAR | Status: AC
  Administered 2013-08-18: 25 ug via INTRAVENOUS

## 2013-08-18 MED ORDER — NEOMYCIN-POLYMYXIN-DEXAMETH 3.5-10000-0.1 OP SUSP
OPHTHALMIC | Status: DC | PRN
  Administered 2013-08-18: 1 [drp] via OPHTHALMIC

## 2013-08-18 MED ORDER — EPINEPHRINE HCL 1 MG/ML IJ SOLN
INTRAOCULAR | Status: DC | PRN
  Administered 2013-08-18: 10:00:00

## 2013-08-18 MED ORDER — PHENYLEPHRINE HCL 2.5 % OP SOLN
1.0000 [drp] | OPHTHALMIC | Status: AC
  Administered 2013-08-18 (×3): 1 [drp] via OPHTHALMIC

## 2013-08-18 SURGICAL SUPPLY — 33 items
CAPSULAR TENSION RING-AMO (OPHTHALMIC RELATED) IMPLANT
CLOTH BEACON ORANGE TIMEOUT ST (SAFETY) ×2 IMPLANT
EYE SHIELD UNIVERSAL CLEAR (GAUZE/BANDAGES/DRESSINGS) ×2 IMPLANT
GLOVE BIO SURGEON STRL SZ 6.5 (GLOVE) IMPLANT
GLOVE BIOGEL PI IND STRL 6.5 (GLOVE) ×1 IMPLANT
GLOVE BIOGEL PI IND STRL 7.0 (GLOVE) IMPLANT
GLOVE BIOGEL PI IND STRL 7.5 (GLOVE) ×1 IMPLANT
GLOVE BIOGEL PI INDICATOR 6.5 (GLOVE) ×1
GLOVE BIOGEL PI INDICATOR 7.0 (GLOVE)
GLOVE BIOGEL PI INDICATOR 7.5 (GLOVE) ×1
GLOVE ECLIPSE 6.5 STRL STRAW (GLOVE) IMPLANT
GLOVE ECLIPSE 7.0 STRL STRAW (GLOVE) IMPLANT
GLOVE ECLIPSE 7.5 STRL STRAW (GLOVE) IMPLANT
GLOVE EXAM NITRILE LRG STRL (GLOVE) IMPLANT
GLOVE EXAM NITRILE MD LF STRL (GLOVE) IMPLANT
GLOVE SKINSENSE NS SZ6.5 (GLOVE)
GLOVE SKINSENSE NS SZ7.0 (GLOVE)
GLOVE SKINSENSE STRL SZ6.5 (GLOVE) IMPLANT
GLOVE SKINSENSE STRL SZ7.0 (GLOVE) IMPLANT
KIT VITRECTOMY (OPHTHALMIC RELATED) IMPLANT
PAD ARMBOARD 7.5X6 YLW CONV (MISCELLANEOUS) ×2 IMPLANT
PROC W NO LENS (INTRAOCULAR LENS)
PROC W SPEC LENS (INTRAOCULAR LENS)
PROCESS W NO LENS (INTRAOCULAR LENS) IMPLANT
PROCESS W SPEC LENS (INTRAOCULAR LENS) IMPLANT
RETRACTOR IRIS SIGHTPATH (OPHTHALMIC RELATED) IMPLANT
RING MALYGIN (MISCELLANEOUS) IMPLANT
SIGHTPATH CAT PROC W REG LENS (Ophthalmic Related) ×2 IMPLANT
SYRINGE LUER LOK 1CC (MISCELLANEOUS) ×2 IMPLANT
TAPE SURG TRANSPORE 1 IN (GAUZE/BANDAGES/DRESSINGS) ×1 IMPLANT
TAPE SURGICAL TRANSPORE 1 IN (GAUZE/BANDAGES/DRESSINGS) ×1
VISCOELASTIC ADDITIONAL (OPHTHALMIC RELATED) IMPLANT
WATER STERILE IRR 250ML POUR (IV SOLUTION) ×2 IMPLANT

## 2013-08-18 NOTE — Anesthesia Preprocedure Evaluation (Signed)
Anesthesia Evaluation  Patient identified by MRN, date of birth, ID band Patient awake    Reviewed: Allergy & Precautions, H&P , NPO status , Patient's Chart, lab work & pertinent test results  Airway Mallampati: III TM Distance: >3 FB Neck ROM: Full    Dental no notable dental hx.    Pulmonary neg pulmonary ROS,  breath sounds clear to auscultation  Pulmonary exam normal       Cardiovascular hypertension, Pt. on medications negative cardio ROS  Rhythm:Regular Rate:Normal     Neuro/Psych PSYCHIATRIC DISORDERS Anxiety Depression negative neurological ROS  negative psych ROS   GI/Hepatic negative GI ROS, Neg liver ROS,   Endo/Other  negative endocrine ROS  Renal/GU negative Renal ROS  negative genitourinary   Musculoskeletal negative musculoskeletal ROS (+)   Abdominal   Peds negative pediatric ROS (+)  Hematology negative hematology ROS (+)   Anesthesia Other Findings   Reproductive/Obstetrics negative OB ROS                           Anesthesia Physical Anesthesia Plan  ASA: II  Anesthesia Plan: MAC   Post-op Pain Management:    Induction: Intravenous  Airway Management Planned: Nasal Cannula  Additional Equipment:   Intra-op Plan:   Post-operative Plan:   Informed Consent: I have reviewed the patients History and Physical, chart, labs and discussed the procedure including the risks, benefits and alternatives for the proposed anesthesia with the patient or authorized representative who has indicated his/her understanding and acceptance.     Plan Discussed with:   Anesthesia Plan Comments:         Anesthesia Quick Evaluation

## 2013-08-18 NOTE — H&P (Signed)
I have reviewed the H&P, the patient was re-examined, and I have identified no interval changes in medical condition and plan of care since the history and physical of record  

## 2013-08-18 NOTE — Anesthesia Postprocedure Evaluation (Signed)
  Anesthesia Post-op Note  Patient: Sylvia Barnett  Procedure(s) Performed: Procedure(s) with comments: CATARACT EXTRACTION PHACO AND INTRAOCULAR LENS PLACEMENT (IOC) (Left) - CDE: 8.62  Patient Location: Short Stay  Anesthesia Type:MAC  Level of Consciousness: awake, alert  and oriented  Airway and Oxygen Therapy: Patient Spontanous Breathing  Post-op Pain: none  Post-op Assessment: Post-op Vital signs reviewed, Patient's Cardiovascular Status Stable, Respiratory Function Stable, Patent Airway and No signs of Nausea or vomiting  Post-op Vital Signs: Reviewed and stable  Last Vitals:  Filed Vitals:   08/18/13 0940  BP: 125/63  Pulse: 88  Temp:     Complications: No apparent anesthesia complications

## 2013-08-18 NOTE — Op Note (Signed)
Date of Admission: 08/18/2013  Date of Surgery: 08/18/2013   Pre-Op Dx: Cataract Left Eye  Post-Op Dx: Senile Combined  Cataract Left  Eye,  Dx Code 366.19  Surgeon: Tonny Branch, M.D.  Assistants: None  Anesthesia: Topical with MAC  Indications: Painless, progressive loss of vision with compromise of daily activities.  Surgery: Cataract Extraction with Intraocular lens Implant Left Eye  Discription: The patient had dilating drops and viscous lidocaine placed into the Left eye in the pre-op holding area. After transfer to the operating room, a time out was performed. The patient was then prepped and draped. Beginning with a 71 degree blade a paracentesis port was made at the surgeon's 2 o'clock position. The anterior chamber was then filled with 1% non-preserved lidocaine. This was followed by filling the anterior chamber with Provisc.  A 2.21mm keratome blade was used to make a clear corneal incision at the temporal limbus.  A bent cystatome needle was used to create a continuous tear capsulotomy. Hydrodissection was performed with balanced salt solution on a Fine canula. The lens nucleus was then removed using the phacoemulsification handpiece. Residual cortex was removed with the I&A handpiece. The anterior chamber and capsular bag were refilled with Provisc. A posterior chamber intraocular lens was placed into the capsular bag with it's injector. The implant was positioned with the Kuglan hook. The Provisc was then removed from the anterior chamber and capsular bag with the I&A handpiece. Stromal hydration of the main incision and paracentesis port was performed with BSS on a Fine canula. The wounds were tested for leak which was negative. The patient tolerated the procedure well. There were no operative complications. The patient was then transferred to the recovery room in stable condition.  Complications: None  Specimen: None  EBL: None  Prosthetic device: Hoya iSert 250, power 13.5 D, SN  [id number].

## 2013-08-18 NOTE — Transfer of Care (Signed)
Immediate Anesthesia Transfer of Care Note  Patient: Sylvia Barnett  Procedure(s) Performed: Procedure(s) with comments: CATARACT EXTRACTION PHACO AND INTRAOCULAR LENS PLACEMENT (IOC) (Left) - CDE: 8.62  Patient Location: Short Stay  Anesthesia Type:MAC  Level of Consciousness: awake  Airway & Oxygen Therapy: Patient Spontanous Breathing  Post-op Assessment: Report given to PACU RN  Post vital signs: Reviewed  Complications: No apparent anesthesia complications

## 2013-08-19 ENCOUNTER — Encounter (HOSPITAL_COMMUNITY): Payer: Self-pay | Admitting: Ophthalmology

## 2013-09-29 ENCOUNTER — Encounter (INDEPENDENT_AMBULATORY_CARE_PROVIDER_SITE_OTHER): Payer: Self-pay | Admitting: *Deleted

## 2013-10-18 DIAGNOSIS — H1045 Other chronic allergic conjunctivitis: Secondary | ICD-10-CM | POA: Diagnosis not present

## 2013-10-27 ENCOUNTER — Ambulatory Visit (INDEPENDENT_AMBULATORY_CARE_PROVIDER_SITE_OTHER): Payer: Medicare Other | Admitting: Internal Medicine

## 2013-10-27 ENCOUNTER — Telehealth (INDEPENDENT_AMBULATORY_CARE_PROVIDER_SITE_OTHER): Payer: Self-pay | Admitting: *Deleted

## 2013-10-27 ENCOUNTER — Encounter (INDEPENDENT_AMBULATORY_CARE_PROVIDER_SITE_OTHER): Payer: Self-pay | Admitting: Internal Medicine

## 2013-10-27 ENCOUNTER — Other Ambulatory Visit (INDEPENDENT_AMBULATORY_CARE_PROVIDER_SITE_OTHER): Payer: Self-pay | Admitting: *Deleted

## 2013-10-27 VITALS — BP 180/90 | HR 76 | Temp 98.3°F | Ht 65.0 in | Wt 212.7 lb

## 2013-10-27 DIAGNOSIS — E7800 Pure hypercholesterolemia, unspecified: Secondary | ICD-10-CM | POA: Insufficient documentation

## 2013-10-27 DIAGNOSIS — R197 Diarrhea, unspecified: Secondary | ICD-10-CM

## 2013-10-27 DIAGNOSIS — I10 Essential (primary) hypertension: Secondary | ICD-10-CM | POA: Insufficient documentation

## 2013-10-27 DIAGNOSIS — C50912 Malignant neoplasm of unspecified site of left female breast: Secondary | ICD-10-CM

## 2013-10-27 DIAGNOSIS — Z800 Family history of malignant neoplasm of digestive organs: Secondary | ICD-10-CM | POA: Insufficient documentation

## 2013-10-27 DIAGNOSIS — R152 Fecal urgency: Secondary | ICD-10-CM

## 2013-10-27 DIAGNOSIS — C50919 Malignant neoplasm of unspecified site of unspecified female breast: Secondary | ICD-10-CM | POA: Insufficient documentation

## 2013-10-27 DIAGNOSIS — Z1211 Encounter for screening for malignant neoplasm of colon: Secondary | ICD-10-CM

## 2013-10-27 MED ORDER — DICYCLOMINE HCL 10 MG PO CAPS: 10.0000 mg | ORAL_CAPSULE | Freq: Two times a day (BID) | ORAL | Status: DC

## 2013-10-27 NOTE — Progress Notes (Addendum)
Subjective:    Patient ID: Sylvia Barnett, female    DOB: 03-17-1945, 68 y.o.   MRN: [id number]  HPI Referred to our office for loose stools/colonoscopy. She tells me since she stopped the HCTZ her stools are more formed but are soft. While on the HCTZ, her stools were always loose. She also has been incontinent. Last episode of incontinence was 10/11/2013. She is having 2-3 stools a day. No melena or BRRB. Stools are formed but soft. Appetite is good. No weight loss. No abdominal pain.  Her last colonoscopy was 5 yrs by Dr. Lindalou Hose.. Family hx of colon cancer in a sister and patient has hx of a polyp.    Stopped the HCTZ 10/02/2013. Started 2 yrs ago. She thinks it causes diarrhea.    Review of Systems Past Medical History  Diagnosis Date  . Complication of anesthesia     Has a small throat  . Hypertension   . Arthritis     Right hip  . Anxiety   . Depression   . Cancer 2004     Breast Lumpectomy  . Hypercholesteremia     Past Surgical History  Procedure Laterality Date  . Joint replacement  08-2010    Left total knee  . Abdominal hysterectomy  2002  . Appendectomy  1949  . Wrist surgery Bilateral 03-2010  . Wrist surgery  2008    Right  . Knee arthroscopy  2010    Right  . Knee arthroscopy  2003    Left  . Bladder tack  2002  . Breast lumpectomy  2004    Left  . Breast lumpectomy  2004    Left  . Breast lumpectomy  2006    Left  . Biopsy breast  2010    Left  . Breast lumpectomy  2000    Left  . Eye surgery  2011    Catarct Right  . Eye surgery  08-2011    Right Yag procedure  . Bunionectomy  1994    Right  . Total knee arthroplasty  11/03/2011    Procedure: TOTAL KNEE ARTHROPLASTY;  Surgeon: Gearlean Alf, MD;  Location: [facility];  Service: Orthopedics;  Laterality: Right;  . Tubal ligation    . Cataract extraction w/phaco Left 08/18/2013    Procedure: CATARACT EXTRACTION PHACO AND INTRAOCULAR LENS PLACEMENT (IOC);  Surgeon: Tonny Branch, MD;   Location: [facility];  Service: Ophthalmology;  Laterality: Left;  CDE: 8.62    Allergies  Allergen Reactions  . Latex Swelling and Other (See Comments)    blisters  . Sulfa Antibiotics Rash    Current Outpatient Prescriptions on File Prior to Visit  Medication Sig Dispense Refill  . atorvastatin (LIPITOR) 10 MG tablet Take 10 mg by mouth at bedtime.      . calcium carbonate (OS-CAL) 600 MG TABS tablet Take 600 mg by mouth 2 (two) times daily with a meal.      . naproxen sodium (ANAPROX) 220 MG tablet Take 440 mg by mouth daily as needed (pain).      Marland Kitchen trolamine salicylate (ASPERCREME) 10 % cream Apply 1 application topically as needed. Apply to painful areas around hip and back      . hydrochlorothiazide (HYDRODIURIL) 25 MG tablet Take 25 mg by mouth every morning.       No current facility-administered medications on file prior to visit.        Objective:   Physical Exam  Filed Vitals:  10/27/13 1500  BP: 180/90  Pulse: 76  Temp: 98.3 F (36.8 C)  Height: 5\' 5"  (1.651 m)  Weight: 212 lb 11.2 oz (96.48 kg)  Alert and oriented. Skin warm and dry. Oral mucosa is moist.   . Sclera anicteric, conjunctivae is pink. Thyroid not enlarged. No cervical lymphadenopathy. Lungs clear. Heart regular rate and rhythm.  Abdomen is soft. Bowel sounds are positive. No hepatomegaly. No abdominal masses felt. No tenderness.  No edema to lower extremities.  Stool green and guaiac negative.     Assessment & Plan:   Diarrhea, better now. Still stools are very soft. Rx for Dicyclomine 10 mg BID. Colonoscopy. Family history of colon cancer

## 2013-10-27 NOTE — Patient Instructions (Signed)
Dicyclomine 10mg  BID. Colonoscopy. Go to [facility] on schedule.

## 2013-10-27 NOTE — Telephone Encounter (Signed)
Patient needs movi prep 

## 2013-10-31 ENCOUNTER — Encounter (HOSPITAL_COMMUNITY): Payer: Self-pay | Admitting: Pharmacy Technician

## 2013-10-31 ENCOUNTER — Telehealth (INDEPENDENT_AMBULATORY_CARE_PROVIDER_SITE_OTHER): Payer: Self-pay | Admitting: *Deleted

## 2013-10-31 MED ORDER — PEG-KCL-NACL-NASULF-NA ASC-C 100 G PO SOLR: 1.0000 | Freq: Once | ORAL | Status: DC

## 2013-10-31 NOTE — Telephone Encounter (Signed)
Do not take any more Bentyl. Stools are soft, but are formed. Will hold for now.

## 2013-10-31 NOTE — Telephone Encounter (Signed)
[name] took one BENTYL and it gave her abd cramps really bad for 2 days. She is not able to take this medicine. If Sylvia Barnett would please call her [phone].        6

## 2013-11-03 DIAGNOSIS — I10 Essential (primary) hypertension: Secondary | ICD-10-CM | POA: Diagnosis not present

## 2013-11-09 MED ORDER — FENTANYL CITRATE 0.05 MG/ML IJ SOLN: 25.0000 ug | INTRAMUSCULAR | Status: DC | PRN

## 2013-11-09 MED ORDER — ONDANSETRON HCL 4 MG/2ML IJ SOLN
4.0000 mg | Freq: Once | INTRAMUSCULAR | Status: AC | PRN
Start: 2013-11-09 — End: 2013-11-09

## 2013-11-10 ENCOUNTER — Encounter (HOSPITAL_COMMUNITY): Payer: Self-pay

## 2013-11-10 ENCOUNTER — Ambulatory Visit (HOSPITAL_COMMUNITY)
Admission: RE | Admit: 2013-11-10 | Discharge: 2013-11-10 | Disposition: A | Discharge status: Home / Self Care | Payer: Medicare Other | Source: Ambulatory Visit | Attending: Internal Medicine | Admitting: Internal Medicine

## 2013-11-10 ENCOUNTER — Encounter (HOSPITAL_COMMUNITY)
Admission: RE | Disposition: A | Discharge status: Home / Self Care | Payer: Self-pay | Source: Ambulatory Visit | Attending: Internal Medicine

## 2013-11-10 DIAGNOSIS — I10 Essential (primary) hypertension: Secondary | ICD-10-CM | POA: Insufficient documentation

## 2013-11-10 DIAGNOSIS — F419 Anxiety disorder, unspecified: Secondary | ICD-10-CM | POA: Diagnosis not present

## 2013-11-10 DIAGNOSIS — Z86010 Personal history of colonic polyps: Secondary | ICD-10-CM | POA: Insufficient documentation

## 2013-11-10 DIAGNOSIS — D122 Benign neoplasm of ascending colon: Secondary | ICD-10-CM

## 2013-11-10 DIAGNOSIS — K5730 Diverticulosis of large intestine without perforation or abscess without bleeding: Secondary | ICD-10-CM

## 2013-11-10 DIAGNOSIS — R197 Diarrhea, unspecified: Secondary | ICD-10-CM

## 2013-11-10 DIAGNOSIS — E780 Pure hypercholesterolemia: Secondary | ICD-10-CM | POA: Insufficient documentation

## 2013-11-10 DIAGNOSIS — Z800 Family history of malignant neoplasm of digestive organs: Secondary | ICD-10-CM

## 2013-11-10 DIAGNOSIS — Z91040 Latex allergy status: Secondary | ICD-10-CM | POA: Diagnosis not present

## 2013-11-10 DIAGNOSIS — Z882 Allergy status to sulfonamides status: Secondary | ICD-10-CM | POA: Diagnosis not present

## 2013-11-10 DIAGNOSIS — Z79899 Other long term (current) drug therapy: Secondary | ICD-10-CM | POA: Insufficient documentation

## 2013-11-10 DIAGNOSIS — Z853 Personal history of malignant neoplasm of breast: Secondary | ICD-10-CM | POA: Insufficient documentation

## 2013-11-10 DIAGNOSIS — F329 Major depressive disorder, single episode, unspecified: Secondary | ICD-10-CM | POA: Insufficient documentation

## 2013-11-10 DIAGNOSIS — R152 Fecal urgency: Secondary | ICD-10-CM | POA: Diagnosis not present

## 2013-11-10 HISTORY — PX: COLONOSCOPY: SHX5424

## 2013-11-10 SURGERY — COLONOSCOPY
Anesthesia: Moderate Sedation

## 2013-11-10 MED ORDER — INULIN 1.5 G PO CHEW: 2.0000 | CHEWABLE_TABLET | Freq: Every day | ORAL | Status: DC

## 2013-11-10 MED ORDER — MEPERIDINE HCL 50 MG/ML IJ SOLN
INTRAMUSCULAR | Status: DC | PRN
  Administered 2013-11-10 (×2): 25 mg via INTRAVENOUS

## 2013-11-10 MED ORDER — MEPERIDINE HCL 50 MG/ML IJ SOLN
INTRAMUSCULAR | Status: AC
  Filled 2013-11-10: qty 1

## 2013-11-10 MED ORDER — MIDAZOLAM HCL 5 MG/5ML IJ SOLN
INTRAMUSCULAR | Status: DC | PRN
  Administered 2013-11-10 (×4): 2 mg via INTRAVENOUS

## 2013-11-10 MED ORDER — MIDAZOLAM HCL 5 MG/5ML IJ SOLN
INTRAMUSCULAR | Status: AC
  Filled 2013-11-10: qty 10

## 2013-11-10 MED ORDER — STERILE WATER FOR IRRIGATION IR SOLN
Status: DC | PRN
  Administered 2013-11-10: 15:00:00

## 2013-11-10 MED ORDER — LOPERAMIDE HCL 2 MG PO CAPS: 2.0000 mg | ORAL_CAPSULE | Freq: Every day | ORAL | Status: DC

## 2013-11-10 MED ORDER — SODIUM CHLORIDE 0.9 % IV SOLN
INTRAVENOUS | Status: DC
  Administered 2013-11-10: 14:00:00 via INTRAVENOUS

## 2013-11-10 NOTE — H&P (Signed)
Sylvia Barnett is an 68 y.o. female.   Chief Complaint: Patient is here for colonoscopy. HPI: Patient is 68 year old Caucasian female who presents with one-year history of diarrhea. She has 4-5 stools every day. She denies constipation. She's had a few accidents. She denies abdominal pain rectal bleeding. She also denies weight loss. She is using 1-2 Imodium daily an as-needed basis. She was seen in the office recently and given dicyclomine but she stopped after taking one dose because of abdominal pain. History significant for colon carcinoma sister who is 33 at time of diagnosis but died of lymphoma 9 years later. Patient had single polypoid first colonoscopy 10 years ago but none were found on last exam 5 years ago. Both of the studies were done at [facility].  Past Medical History  Diagnosis Date  . Complication of anesthesia     Has a small throat  . Hypertension   . Arthritis     Right hip  . Anxiety   . Depression   . Cancer 2004     Breast Lumpectomy  . Hypercholesteremia     Past Surgical History  Procedure Laterality Date  . Joint replacement  08-2010    Left total knee  . Abdominal hysterectomy  2002  . Appendectomy  1949  . Wrist surgery Bilateral 03-2010  . Wrist surgery  2008    Right  . Knee arthroscopy  2010    Right  . Knee arthroscopy  2003    Left  . Bladder tack  2002  . Breast lumpectomy  2004    Left  . Breast lumpectomy  2004    Left  . Breast lumpectomy  2006    Left  . Biopsy breast  2010    Left  . Breast lumpectomy  2000    Left  . Eye surgery  2011    Catarct Right  . Eye surgery  08-2011    Right Yag procedure  . Bunionectomy  1994    Right  . Total knee arthroplasty  11/03/2011    Procedure: TOTAL KNEE ARTHROPLASTY;  Surgeon: Gearlean Alf, MD;  Location: [facility];  Service: Orthopedics;  Laterality: Right;  . Tubal ligation    . Cataract extraction w/phaco Left 08/18/2013    Procedure: CATARACT EXTRACTION PHACO AND INTRAOCULAR LENS  PLACEMENT (IOC);  Surgeon: Tonny Branch, MD;  Location: [facility];  Service: Ophthalmology;  Laterality: Left;  CDE: 8.62    History reviewed. No pertinent family history. Social History:  reports that she has never smoked. She has never used smokeless tobacco. She reports that she does not drink alcohol or use illicit drugs.  Allergies:  Allergies  Allergen Reactions  . Latex Swelling and Other (See Comments)    blisters  . Sulfa Antibiotics Rash    Medications Prior to Admission  Medication Sig Dispense Refill  . acetaminophen (TYLENOL) 500 MG tablet Take 1,000 mg by mouth daily as needed for moderate pain.      Marland Kitchen atorvastatin (LIPITOR) 10 MG tablet Take 10 mg by mouth at bedtime.      . calcium carbonate (OS-CAL) 600 MG TABS tablet Take 600 mg by mouth 2 (two) times daily with a meal.      . cholecalciferol (VITAMIN D) 1000 UNITS tablet Take 2,000 Units by mouth daily.      . Cyanocobalamin (VITAMIN B-12 PO) Take 1 tablet by mouth daily.      . hydrochlorothiazide (HYDRODIURIL) 25 MG tablet Take 25 mg by  mouth every morning.      . loperamide (IMODIUM) 2 MG capsule Take by mouth as needed for diarrhea or loose stools.      . naproxen sodium (ANAPROX) 220 MG tablet Take 440 mg by mouth daily as needed (pain).      . peg 3350 powder (MOVIPREP) 100 G SOLR Take 1 kit (200 g total) by mouth once.  1 kit  0  . trolamine salicylate (ASPERCREME) 10 % cream Apply 1 application topically as needed. Apply to painful areas around hip and back        No results found for this or any previous visit (from the past 48 hour(s)). No results found.  ROS  Blood pressure 145/59, pulse 64, temperature 98.3 F (36.8 C), temperature source Oral, resp. rate 17, SpO2 100.00%. Physical Exam  Constitutional: She appears well-developed and well-nourished.  HENT:  Mouth/Throat: Oropharynx is clear and moist.  Eyes: Conjunctivae are normal. No scleral icterus.  Neck: No thyromegaly present.  Cardiovascular:  Normal rate, regular rhythm and normal heart sounds.   No murmur heard. Respiratory: Effort normal and breath sounds normal.  GI: Soft. She exhibits no distension and no mass. There is no tenderness.  Musculoskeletal: She exhibits no edema.  Lymphadenopathy:    She has no cervical adenopathy.  Neurological: She is alert.  Skin: Skin is warm and dry.     Assessment/Plan Chronic diarrhea. Family history of CRC in one sibling at age 35. Diagnostic colonoscopy  REHMAN,NAJEEB U 11/10/2013, 3:07 PM

## 2013-11-10 NOTE — Discharge Instructions (Signed)
Resume usual medications and diet. Fiber choice chew 2 tablets daily. Imodium OTC or loperamide 2 mg by mouth every morning. No driving for 24 hours. Physician will call with biopsy results.  Colon Polyps Polyps are lumps of extra tissue growing inside the body. Polyps can grow in the large intestine (colon). Most colon polyps are noncancerous (benign). However, some colon polyps can become cancerous over time. Polyps that are larger than a pea may be harmful. To be safe, caregivers remove and test all polyps. CAUSES  Polyps form when mutations in the genes cause your cells to grow and divide even though no more tissue is needed. RISK FACTORS There are a number of risk factors that can increase your chances of getting colon polyps. They include:  Being older than 50 years.  Family history of colon polyps or colon cancer.  Long-term colon diseases, such as colitis or Crohn disease.  Being overweight.  Smoking.  Being inactive.  Drinking too much alcohol. SYMPTOMS  Most small polyps do not cause symptoms. If symptoms are present, they may include:  Blood in the stool. The stool may look dark red or black.  Constipation or diarrhea that lasts longer than 1 week. DIAGNOSIS People often do not know they have polyps until their caregiver finds them during a regular checkup. Your caregiver can use 4 tests to check for polyps:  Digital rectal exam. The caregiver wears gloves and feels inside the rectum. This test would find polyps only in the rectum.  Barium enema. The caregiver puts a liquid called barium into your rectum before taking X-rays of your colon. Barium makes your colon look white. Polyps are dark, so they are easy to see in the X-ray pictures.  Sigmoidoscopy. A thin, flexible tube (sigmoidoscope) is placed into your rectum. The sigmoidoscope has a light and tiny camera in it. The caregiver uses the sigmoidoscope to look at the last third of your colon.  Colonoscopy. This  test is like sigmoidoscopy, but the caregiver looks at the entire colon. This is the most common method for finding and removing polyps. TREATMENT  Any polyps will be removed during a sigmoidoscopy or colonoscopy. The polyps are then tested for cancer. PREVENTION  To help lower your risk of getting more colon polyps:  Eat plenty of fruits and vegetables. Avoid eating fatty foods.  Do not smoke.  Avoid drinking alcohol.  Exercise every day.  Lose weight if recommended by your caregiver.  Eat plenty of calcium and folate. Foods that are rich in calcium include milk, cheese, and broccoli. Foods that are rich in folate include chickpeas, kidney beans, and spinach. HOME CARE INSTRUCTIONS Keep all follow-up appointments as directed by your caregiver. You may need periodic exams to check for polyps. SEEK MEDICAL CARE IF: You notice bleeding during a bowel movement. Document Released: 09/26/2003 Document Revised: 03/24/2011 Document Reviewed: 03/11/2011 [facility] Patient Information 2015 [place], [place]. This information is not intended to replace advice given to you by your health care provider. Make sure you discuss any questions you have with your health care provider. Esophagogastroduodenoscopy Care After Refer to this sheet in the next few weeks. These instructions provide you with information on caring for yourself after your procedure. Your caregiver may also give you more specific instructions. Your treatment has been planned according to current medical practices, but problems sometimes occur. Call your caregiver if you have any problems or questions after your procedure.  HOME CARE INSTRUCTIONS  Do not eat or drink anything until the  numbing medicine (local anesthetic) has worn off and your gag reflex has returned. You will know that the local anesthetic has worn off when you can swallow comfortably.  Do not drive for 12 hours after the procedure or as directed by your  caregiver.  Only take medicines as directed by your caregiver. SEEK MEDICAL CARE IF:   You cannot stop coughing.  You are not urinating at all or less than usual. SEEK IMMEDIATE MEDICAL CARE IF:  You have difficulty swallowing.  You cannot eat or drink.  You have worsening throat or chest pain.  You have dizziness, lightheadedness, or you faint.  You have nausea or vomiting.  You have chills.  You have a fever.  You have severe abdominal pain.  You have black, tarry, or bloody stools. Document Released: 12/17/2011 Document Reviewed: 12/17/2011 [facility] Patient Information 2015 [place]. This information is not intended to replace advice given to you by your health care provider. Make sure you discuss any questions you have with your health care provider. Colonoscopy, Care After Refer to this sheet in the next few weeks. These instructions provide you with information on caring for yourself after your procedure. Your health care provider may also give you more specific instructions. Your treatment has been planned according to current medical practices, but problems sometimes occur. Call your health care provider if you have any problems or questions after your procedure. WHAT TO EXPECT AFTER THE PROCEDURE  After your procedure, it is typical to have the following:  A small amount of blood in your stool.  Moderate amounts of gas and mild abdominal cramping or bloating. HOME CARE INSTRUCTIONS  Do not drive, operate machinery, or sign important documents for 24 hours.  You may shower and resume your regular physical activities, but move at a slower pace for the first 24 hours.  Take frequent rest periods for the first 24 hours.  Walk around or put a warm pack on your abdomen to help reduce abdominal cramping and bloating.  Drink enough fluids to keep your urine clear or pale yellow.  You may resume your normal diet as instructed by your health care provider. Avoid  heavy or fried foods that are hard to digest.  Avoid drinking alcohol for 24 hours or as instructed by your health care provider.  Only take over-the-counter or prescription medicines as directed by your health care provider.  If a tissue sample (biopsy) was taken during your procedure:  Do not take aspirin or blood thinners for 7 days, or as instructed by your health care provider.  Do not drink alcohol for 7 days, or as instructed by your health care provider.  Eat soft foods for the first 24 hours. SEEK MEDICAL CARE IF: You have persistent spotting of blood in your stool 2-3 days after the procedure. SEEK IMMEDIATE MEDICAL CARE IF:  You have more than a small spotting of blood in your stool.  You pass large blood clots in your stool.  Your abdomen is swollen (distended).  You have nausea or vomiting.  You have a fever.  You have increasing abdominal pain that is not relieved with medicine. Document Released: 08/14/2003 Document Revised: 10/20/2012 Document Reviewed: 09/06/2012 [employer] Patient Information 2015 [place], [place]. This information is not intended to replace advice given to you by your health care provider. Make sure you discuss any questions you have with your health care provider.

## 2013-11-10 NOTE — Op Note (Signed)
COLONOSCOPY PROCEDURE REPORT  PATIENT:  Sylvia Barnett  MR#:  [id number] Birthdate:  [birth year], 68 y.o., female Endoscopist:  Dr. Rogene Houston, MD Referred By:  Dr. Manon Hilding, MD  Procedure Date: 11/10/2013  Procedure:   Colonoscopy  Indications:  Patient is 68 year old Caucasian female who presents with one-year history of nonbloody diarrhea not associated with weight loss. He has history of colonic polyp and family history of colon carcinoma and sister who was 68 at the time of diagnosis. Prior colonoscopies were 10 and 5 years ago. She had 1 polyp removed on her first colonoscopy.  Informed Consent:  The procedure and risks were reviewed with the patient and informed consent was obtained.  Medications:  Demerol 50 mg IV Versed 8 mg IV  Description of procedure:  After a digital rectal exam was performed, that colonoscope was advanced from the anus through the rectum and colon to the area of the cecum, ileocecal valve and appendiceal orifice. The cecum was deeply intubated. These structures were well-seen and photographed for the record. From the level of the cecum and ileocecal valve, the scope was slowly and cautiously withdrawn. The mucosal surfaces were carefully surveyed utilizing scope tip to flexion to facilitate fold flattening as needed. The scope was pulled down into the rectum where a thorough exam including retroflexion was performed.  Findings:   Prep excellent. 5 mm polyp was cold snared from ascending colon. Another small polyp was ablated via cold biopsy from ascending colon. This polyp could not be snared because of its location. Normal mucosa of colon and rectum. Few small diverticula and sigmoid colon. Unremarkable anorectal junction.   Therapeutic/Diagnostic Maneuvers Performed:  Random biopsy taken from mucosa of sigmoid colon for routine histology.  Complications:  None  Cecal Withdrawal Time:  23 minutes  Impression:  Examination performed to  cecum. Two small polyps removed from ascending colon and submitted together(one was cold snared and the other one was ablated via cold biopsy). No evidence of endoscopic colitis. I'll see taken from mucosa of sigmoid colon to rule out microscopic or collagenous colitis. Mild sigmoid colon diverticulosis.  Recommendations:  Standard instructions given. Fiber choice  chew 2 tablets daily. Loperamide OTC 2 mg by mouth every morning. I will contact patient with biopsy results and further recommendations. Next colonoscopy in 5 years.  REHMAN,NAJEEB U  11/10/2013 4:09 PM  CC: Dr. Manon Hilding, MD & Dr. [name] ref. provider found

## 2013-11-11 ENCOUNTER — Encounter (HOSPITAL_COMMUNITY): Payer: Self-pay | Admitting: Internal Medicine

## 2013-11-11 DIAGNOSIS — K529 Noninfective gastroenteritis and colitis, unspecified: Secondary | ICD-10-CM | POA: Diagnosis not present

## 2013-11-11 DIAGNOSIS — D122 Benign neoplasm of ascending colon: Secondary | ICD-10-CM | POA: Diagnosis not present

## 2013-11-22 ENCOUNTER — Other Ambulatory Visit (INDEPENDENT_AMBULATORY_CARE_PROVIDER_SITE_OTHER): Payer: Self-pay | Admitting: Internal Medicine

## 2013-11-22 ENCOUNTER — Telehealth (INDEPENDENT_AMBULATORY_CARE_PROVIDER_SITE_OTHER): Payer: Self-pay | Admitting: *Deleted

## 2013-11-22 MED ORDER — BUDESONIDE 3 MG PO CP24: 9.0000 mg | ORAL_CAPSULE | Freq: Every day | ORAL | Status: DC

## 2013-11-22 NOTE — Telephone Encounter (Signed)
1) Had a TCS on 11/10/13 and would like to get the results 2) Dr. Laural Golden started her on a medication that cost too much  The return phone number is [phone] or [phone]

## 2013-11-22 NOTE — Telephone Encounter (Signed)
I called the Entocort EC in this morning to [facility]. Patient was called and advised. At that time she ask if the blood vessel that was in the picture of her procedure is causing her problem

## 2013-11-23 NOTE — Telephone Encounter (Signed)
I also sent prescription electronically. Colonic mucosa is normal in those pictures

## 2013-11-24 NOTE — Telephone Encounter (Signed)
Will treat with Uceris 9 mg daily. Please asked patient to call us with progress report in one month. Would also help if she would keep stool diary as to frequency and consistency of stools.

## 2013-11-24 NOTE — Telephone Encounter (Signed)
Dr.Rehman the patient states that the Entocort EC is to expensive , is there something else that can be called in for her?

## 2013-11-24 NOTE — Progress Notes (Signed)
Apt has been scheduled for 01/17/14 at 10:30 am with Deberah Castle, NP.

## 2013-11-25 NOTE — Telephone Encounter (Signed)
Patient was called and made aware of Dr.Rehman's recommendation. 

## 2013-11-28 ENCOUNTER — Telehealth (INDEPENDENT_AMBULATORY_CARE_PROVIDER_SITE_OTHER): Payer: Self-pay | Admitting: *Deleted

## 2013-11-28 NOTE — Telephone Encounter (Signed)
Per Dr.Rehman the patient is to take by mouth for 1 mouth the Uceris 9 mg. It will take a little longer for the stools to form. Patient was called and this response was given to her husband as she will not be home until 5:30 pm.

## 2013-11-28 NOTE — Telephone Encounter (Signed)
Malli has takent 2 pills of the ENTOCORT EC Dr. Laural Golden gave her. She has had 2 normal bowel movements and 3 diarrhea with muscu. She would like to know if this is normal. Her return phone number is [phone] or [phone].

## 2013-11-30 ENCOUNTER — Encounter (INDEPENDENT_AMBULATORY_CARE_PROVIDER_SITE_OTHER): Payer: Self-pay | Admitting: *Deleted

## 2013-12-05 ENCOUNTER — Telehealth (INDEPENDENT_AMBULATORY_CARE_PROVIDER_SITE_OTHER): Payer: Self-pay | Admitting: *Deleted

## 2013-12-05 NOTE — Telephone Encounter (Signed)
Patient states that she feels that the Uceris is causing a yeast infection. She has not taken it in 3 days. Per Dr.Rehman may call in Diflucan x three. This was called to the patient's pharmacy.

## 2013-12-05 NOTE — Telephone Encounter (Signed)
Sylvia Barnett asking how long does Dr. Laural Golden think it will take her to get over the Colitis. It has caused her to have a yeast infection. She has been on the Uceris for 1 week, fiber pill and a diarrhea pill. The diarrhea is better, it has formed soft not hard. The return phone number is [phone].

## 2013-12-13 DIAGNOSIS — E780 Pure hypercholesterolemia: Secondary | ICD-10-CM | POA: Diagnosis not present

## 2013-12-13 DIAGNOSIS — R739 Hyperglycemia, unspecified: Secondary | ICD-10-CM | POA: Diagnosis not present

## 2013-12-13 DIAGNOSIS — I10 Essential (primary) hypertension: Secondary | ICD-10-CM | POA: Diagnosis not present

## 2013-12-20 DIAGNOSIS — R609 Edema, unspecified: Secondary | ICD-10-CM | POA: Diagnosis not present

## 2013-12-20 DIAGNOSIS — K6389 Other specified diseases of intestine: Secondary | ICD-10-CM | POA: Diagnosis not present

## 2013-12-20 DIAGNOSIS — Z23 Encounter for immunization: Secondary | ICD-10-CM | POA: Diagnosis not present

## 2013-12-20 DIAGNOSIS — Z1389 Encounter for screening for other disorder: Secondary | ICD-10-CM | POA: Diagnosis not present

## 2013-12-20 DIAGNOSIS — E780 Pure hypercholesterolemia: Secondary | ICD-10-CM | POA: Diagnosis not present

## 2013-12-20 DIAGNOSIS — I10 Essential (primary) hypertension: Secondary | ICD-10-CM | POA: Diagnosis not present

## 2013-12-30 ENCOUNTER — Encounter (INDEPENDENT_AMBULATORY_CARE_PROVIDER_SITE_OTHER): Payer: Self-pay

## 2014-01-10 DIAGNOSIS — I10 Essential (primary) hypertension: Secondary | ICD-10-CM | POA: Diagnosis not present

## 2014-01-10 DIAGNOSIS — R05 Cough: Secondary | ICD-10-CM | POA: Diagnosis not present

## 2014-01-17 ENCOUNTER — Encounter (INDEPENDENT_AMBULATORY_CARE_PROVIDER_SITE_OTHER): Payer: Self-pay | Admitting: Internal Medicine

## 2014-01-17 ENCOUNTER — Ambulatory Visit (INDEPENDENT_AMBULATORY_CARE_PROVIDER_SITE_OTHER): Payer: Medicare Other | Admitting: Internal Medicine

## 2014-01-17 VITALS — BP 174/72 | HR 68 | Temp 97.7°F | Ht 65.0 in | Wt 211.3 lb

## 2014-01-17 DIAGNOSIS — K52832 Lymphocytic colitis: Secondary | ICD-10-CM

## 2014-01-17 DIAGNOSIS — K5289 Other specified noninfective gastroenteritis and colitis: Secondary | ICD-10-CM

## 2014-01-17 NOTE — Progress Notes (Signed)
Subjective:    Patient ID: Sylvia Barnett, female    DOB: 05-07-1945, 69 y.o.   MRN: [id number]  HPI Here today for f/u after recent colonoscopy for non-bloody diarrhea. Biopsy results revealed lymphocytic colitis.  She was started on Budesonide 9mg  daily x 6 weeks. She is still taking Imodium once a week.  Her stools are better. Her last episode of incontinence was 12/30/2013, 01/11/2014 Her stools are formed. Stools are soft. She is having 2-3 stools a day. She does occasionally sees mucous. She has not seen any mucous since 01/11/2014.  Stools are brown in color. No melena or BRRB.  She received pneumonia and pertussia shot  December 20, 2013 at DR. Sasser's office. Appetite remains good. No weight loss.   Procedure Date: 11/10/2013  Procedure: Colonoscopy  Indications: Patient is 69 year old Caucasian female who presents with one-year history of nonbloody diarrhea not associated with weight loss. He has history of colonic polyp and family history of colon carcinoma and sister who was 5 at the time of diagnosis. Prior colonoscopies were 10 and 5 years ago. She had 1 polyp removed on her first colonoscopy. Impression:  Examination performed to cecum. Two small polyps removed from ascending colon and submitted together(one was cold snared and the other one was ablated via cold biopsy). No evidence of endoscopic colitis. I'll see taken from mucosa of sigmoid colon to rule out microscopic or collagenous colitis. Mild sigmoid colon diverticulosis.  Both polyps are tubular adenomas. Biopsy from mucosa of sigmoid colon shows lymphocytic colitis. Results reviewed with patient. Will treat with but Budesonide for 6 weeks Office visit in 8 weeks Patient asked to keep stool diary until office visit   Review of Systems Past Medical History  Diagnosis Date  . Complication of anesthesia     Has a small throat  . Hypertension   . Arthritis     Right hip  . Anxiety   . Depression    . Cancer 2004     Breast Lumpectomy  . Hypercholesteremia     Past Surgical History  Procedure Laterality Date  . Joint replacement  08-2010    Left total knee  . Abdominal hysterectomy  2002  . Appendectomy  1949  . Wrist surgery Bilateral 03-2010  . Wrist surgery  2008    Right  . Knee arthroscopy  2010    Right  . Knee arthroscopy  2003    Left  . Bladder tack  2002  . Breast lumpectomy  2004    Left  . Breast lumpectomy  2004    Left  . Breast lumpectomy  2006    Left  . Biopsy breast  2010    Left  . Breast lumpectomy  2000    Left  . Eye surgery  2011    Catarct Right  . Eye surgery  08-2011    Right Yag procedure  . Bunionectomy  1994    Right  . Total knee arthroplasty  11/03/2011    Procedure: TOTAL KNEE ARTHROPLASTY;  Surgeon: Gearlean Alf, MD;  Location: [facility];  Service: Orthopedics;  Laterality: Right;  . Tubal ligation    . Cataract extraction w/phaco Left 08/18/2013    Procedure: CATARACT EXTRACTION PHACO AND INTRAOCULAR LENS PLACEMENT (IOC);  Surgeon: Tonny Branch, MD;  Location: [facility];  Service: Ophthalmology;  Laterality: Left;  CDE: 8.62  . Colonoscopy N/A 11/10/2013    Procedure: COLONOSCOPY;  Surgeon: Rogene Houston, MD;  Location: [facility];  Service: Endoscopy;  Laterality: N/A;  240    Allergies  Allergen Reactions  . Latex Swelling and Other (See Comments)    blisters  . Sulfa Antibiotics Rash    Current Outpatient Prescriptions on File Prior to Visit  Medication Sig Dispense Refill  . acetaminophen (TYLENOL) 500 MG tablet Take 1,000 mg by mouth daily as needed for moderate pain.    Marland Kitchen atorvastatin (LIPITOR) 10 MG tablet Take 10 mg by mouth at bedtime.    . calcium carbonate (OS-CAL) 600 MG TABS tablet Take 600 mg by mouth 2 (two) times daily with a meal.    . cholecalciferol (VITAMIN D) 1000 UNITS tablet Take 2,000 Units by mouth daily.    . Cyanocobalamin (VITAMIN B-12 PO) Take 1 tablet by mouth as needed.     . loperamide  (IMODIUM) 2 MG capsule Take by mouth as needed for diarrhea or loose stools.    Marland Kitchen loperamide (IMODIUM) 2 MG capsule Take 1 capsule (2 mg total) by mouth daily before breakfast. 1 capsule 0  . trolamine salicylate (ASPERCREME) 10 % cream Apply 1 application topically as needed. Apply to painful areas around hip and back    . hydrochlorothiazide (HYDRODIURIL) 25 MG tablet Take 25 mg by mouth every morning.     No current facility-administered medications on file prior to visit.        Objective:   Physical Exam  Filed Vitals:   01/17/14 1056  Height: 5\' 5"  (1.651 m)  Weight: 211 lb 4.8 oz (95.845 kg)  Alert and oriented. Skin warm and dry. Oral mucosa is moist.   . Sclera anicteric, conjunctivae is pink. Thyroid not enlarged. No cervical lymphadenopathy. Lungs clear. Heart regular rate and rhythm.  Abdomen is soft. Bowel sounds are positive. No hepatomegaly. No abdominal masses felt. No tenderness.  No edema to lower extremities.           Assessment & Plan:  Lymphocytic colitis. She is having 2-3 stools a day. Has not seen any mucous in 5 days. Continue the Imodium daily. OV in 8 weeks.

## 2014-01-17 NOTE — Patient Instructions (Addendum)
OV in 8 weeks. Continue the Imodium

## 2014-01-18 ENCOUNTER — Telehealth (INDEPENDENT_AMBULATORY_CARE_PROVIDER_SITE_OTHER): Payer: Self-pay | Admitting: Internal Medicine

## 2014-01-18 NOTE — Telephone Encounter (Signed)
Incontinent x 1 today. Am going to cover her x 6 more days with Uceris. She will let me know how she does.

## 2014-03-14 ENCOUNTER — Ambulatory Visit (INDEPENDENT_AMBULATORY_CARE_PROVIDER_SITE_OTHER): Payer: Medicare Other | Admitting: Internal Medicine

## 2014-03-14 ENCOUNTER — Encounter (INDEPENDENT_AMBULATORY_CARE_PROVIDER_SITE_OTHER): Payer: Self-pay | Admitting: Internal Medicine

## 2014-03-14 VITALS — BP 146/90 | HR 76 | Temp 97.8°F | Ht 65.0 in | Wt 213.9 lb

## 2014-03-14 DIAGNOSIS — K5289 Other specified noninfective gastroenteritis and colitis: Secondary | ICD-10-CM

## 2014-03-14 DIAGNOSIS — K52832 Lymphocytic colitis: Secondary | ICD-10-CM

## 2014-03-14 NOTE — Progress Notes (Signed)
Subjective:    Patient ID: Sylvia Barnett, female    DOB: Feb 05, 1945, 69 y.o.   MRN: [id number]  HPI Here today for f/u.  Recent colonosocpy in October of 2015 for hx of non-bloody diarrhea. Biopsy results revealed lymphocytic colitis. She was stasrted on Budesonide 9mg  x 6 weeks.  She was also covered with Uceris x 1 week. Today she says she ate at a [employer] on Sunday. . After she left the restaurant she had several BMs.  Last  BM had mucous. This am, she has had 4 BMs. First stool was formed and the rest were loose. She has not seen any mucous.  No melena or BRRB. She has not been incontinent of stool in the past month.  Takes Imodium prn. Appetite is good. No weight loss. She has gained 2 pounds since her lat visit in January.  No abdominal pain.  Usually have two stools a day.    Procedure Date: 11/10/2013  Procedure: Colonoscopy  Indications: Patient is 69 year old Caucasian female who presents with one-year history of nonbloody diarrhea not associated with weight loss. He has history of colonic polyp and family history of colon carcinoma and sister who was 18 at the time of diagnosis. Prior colonoscopies were 10 and 5 years ago. She had 1 polyp removed on her first colonoscopy. Impression:  Examination performed to cecum. Two small polyps removed from ascending colon and submitted together(one was cold snared and the other one was ablated via cold biopsy). No evidence of endoscopic colitis. I'll see taken from mucosa of sigmoid colon to rule out microscopic or collagenous colitis. Mild sigmoid colon diverticulosis.  Both polyps are tubular adenomas. Biopsy from mucosa of sigmoid colon shows lymphocytic colitis. Results reviewed with patient. Will treat with but Budesonide for 6 weeks Office visit in 8 weeks Patient asked to keep stool diary until office visit      Review of Systems Past Medical History  Diagnosis Date  . Complication of anesthesia    Has a small throat  . Hypertension   . Arthritis     Right hip  . Anxiety   . Depression   . Cancer 2004     Breast Lumpectomy  . Hypercholesteremia     Past Surgical History  Procedure Laterality Date  . Joint replacement  08-2010    Left total knee  . Abdominal hysterectomy  2002  . Appendectomy  1949  . Wrist surgery Bilateral 03-2010  . Wrist surgery  2008    Right  . Knee arthroscopy  2010    Right  . Knee arthroscopy  2003    Left  . Bladder tack  2002  . Breast lumpectomy  2004    Left  . Breast lumpectomy  2004    Left  . Breast lumpectomy  2006    Left  . Biopsy breast  2010    Left  . Breast lumpectomy  2000    Left  . Eye surgery  2011    Catarct Right  . Eye surgery  08-2011    Right Yag procedure  . Bunionectomy  1994    Right  . Total knee arthroplasty  11/03/2011    Procedure: TOTAL KNEE ARTHROPLASTY;  Surgeon: Gearlean Alf, MD;  Location: [facility];  Service: Orthopedics;  Laterality: Right;  . Tubal ligation    . Cataract extraction w/phaco Left 08/18/2013    Procedure: CATARACT EXTRACTION PHACO AND INTRAOCULAR LENS PLACEMENT (IOC);  Surgeon: Tonny Branch, MD;  Location: [facility];  Service: Ophthalmology;  Laterality: Left;  CDE: 8.62  . Colonoscopy N/A 11/10/2013    Procedure: COLONOSCOPY;  Surgeon: Rogene Houston, MD;  Location: [facility];  Service: Endoscopy;  Laterality: N/A;  240    Allergies  Allergen Reactions  . Latex Swelling and Other (See Comments)    blisters  . Sulfa Antibiotics Rash    Current Outpatient Prescriptions on File Prior to Visit  Medication Sig Dispense Refill  . acetaminophen (TYLENOL) 500 MG tablet Take 1,000 mg by mouth daily as needed for moderate pain.    Marland Kitchen atorvastatin (LIPITOR) 10 MG tablet Take 10 mg by mouth at bedtime.    . calcium carbonate (OS-CAL) 600 MG TABS tablet Take 600 mg by mouth 2 (two) times daily with a meal.    . cholecalciferol (VITAMIN D) 1000 UNITS tablet Take 2,000 Units by mouth daily.     . clonazePAM (KLONOPIN) 0.5 MG tablet Take 0.5 mg by mouth 2 (two) times daily as needed for anxiety.    . Cyanocobalamin (VITAMIN B-12 PO) Take 1 tablet by mouth as needed.     . loperamide (IMODIUM) 2 MG capsule Take by mouth as needed for diarrhea or loose stools.    Marland Kitchen loperamide (IMODIUM) 2 MG capsule Take 1 capsule (2 mg total) by mouth daily before breakfast. 1 capsule 0  . losartan-hydrochlorothiazide (HYZAAR) 50-12.5 MG per tablet Take 1 tablet by mouth daily.    Marland Kitchen trolamine salicylate (ASPERCREME) 10 % cream Apply 1 application topically as needed. Apply to painful areas around hip and back     No current facility-administered medications on file prior to visit.        Objective:   Physical Exam  Filed Vitals:   03/14/14 1039  Height: 5\' 5"  (1.651 m)  Weight: 213 lb 14.4 oz (97.024 kg)   Alert and oriented. Skin warm and dry. Oral mucosa is moist.   . Sclera anicteric, conjunctivae is pink. Thyroid not enlarged. No cervical lymphadenopathy. Lungs clear. Heart regular rate and rhythm.  Abdomen is soft. Bowel sounds are positive. No hepatomegaly. No abdominal masses felt. No tenderness.  No edema to lower extremities.         Assessment & Plan:  Hx of lymphocytic colitis. Seems to be doing much better. Really no fecal incontinence. Stools are much better. OV in 1 year.

## 2014-03-14 NOTE — Patient Instructions (Signed)
OV in 1 year. Imodium prn

## 2014-04-13 DIAGNOSIS — E780 Pure hypercholesterolemia: Secondary | ICD-10-CM | POA: Diagnosis not present

## 2014-04-13 DIAGNOSIS — R739 Hyperglycemia, unspecified: Secondary | ICD-10-CM | POA: Diagnosis not present

## 2014-04-13 DIAGNOSIS — I10 Essential (primary) hypertension: Secondary | ICD-10-CM | POA: Diagnosis not present

## 2014-04-18 DIAGNOSIS — E780 Pure hypercholesterolemia: Secondary | ICD-10-CM | POA: Diagnosis not present

## 2014-04-18 DIAGNOSIS — K6389 Other specified diseases of intestine: Secondary | ICD-10-CM | POA: Diagnosis not present

## 2014-04-18 DIAGNOSIS — R609 Edema, unspecified: Secondary | ICD-10-CM | POA: Diagnosis not present

## 2014-04-18 DIAGNOSIS — D519 Vitamin B12 deficiency anemia, unspecified: Secondary | ICD-10-CM | POA: Diagnosis not present

## 2014-04-18 DIAGNOSIS — G629 Polyneuropathy, unspecified: Secondary | ICD-10-CM | POA: Diagnosis not present

## 2014-04-18 DIAGNOSIS — I10 Essential (primary) hypertension: Secondary | ICD-10-CM | POA: Diagnosis not present

## 2014-04-21 DIAGNOSIS — D519 Vitamin B12 deficiency anemia, unspecified: Secondary | ICD-10-CM | POA: Diagnosis not present

## 2014-04-24 ENCOUNTER — Ambulatory Visit (INDEPENDENT_AMBULATORY_CARE_PROVIDER_SITE_OTHER): Payer: Medicare Other | Admitting: Internal Medicine

## 2014-04-24 ENCOUNTER — Encounter (INDEPENDENT_AMBULATORY_CARE_PROVIDER_SITE_OTHER): Payer: Self-pay | Admitting: Internal Medicine

## 2014-04-24 VITALS — BP 128/72 | HR 74 | Temp 98.3°F | Resp 18 | Ht 65.0 in | Wt 211.6 lb

## 2014-04-24 DIAGNOSIS — K589 Irritable bowel syndrome without diarrhea: Secondary | ICD-10-CM

## 2014-04-24 DIAGNOSIS — K5289 Other specified noninfective gastroenteritis and colitis: Secondary | ICD-10-CM | POA: Diagnosis not present

## 2014-04-24 DIAGNOSIS — K52832 Lymphocytic colitis: Secondary | ICD-10-CM

## 2014-04-24 MED ORDER — DICYCLOMINE HCL 10 MG PO CAPS: 10.0000 mg | ORAL_CAPSULE | Freq: Two times a day (BID) | ORAL | Status: DC

## 2014-04-24 MED ORDER — BENEFIBER DRINK MIX PO PACK: 4.0000 g | PACK | Freq: Every day | ORAL | Status: DC

## 2014-04-24 NOTE — Progress Notes (Signed)
Presenting complaint;  Follow-up for diarrhea and fecal incontinence  Subjective:  Patient is 69 year old Caucasian female who has history of lymphocytic colitis and was last seen on 03/14/2014 for diarrhea and fecal incontinence. She could not afford Entocort EC and was given samples of Uceris she stopped after 6 or 7 weeks because of side effects but she says it did not help with the diarrhea. Her last visit she was begun on dicyclomine this taking loperamide OTC on as-needed basis. She is having 2-3 stools per day. First stool is usually formed and subsequent stools are not. She has had multiple accidents but none since she has been on dicyclomine which is 3 weeks ago. She is taking fiber pills on as-needed basis. She has had some urgency. She has good appetite.  She was recently diagnosed with B12 deficiency and received her first shot 3 days ago. She is not taking B12 tablets anymore. History is negative for IBD. Her sister was diagnosed with rectal CA at age 89 but died of unrelated causes that age 35. Her last colonoscopy was in October 2015 with removal of 2 small polyps are tubular adenomas she had mild sigmoid colon diverticulosis.    Current Medications: Outpatient Encounter Prescriptions as of 04/24/2014  Medication Sig  . atorvastatin (LIPITOR) 10 MG tablet Take 10 mg by mouth at bedtime.  . calcium carbonate (OS-CAL) 600 MG TABS tablet Take 600 mg by mouth 2 (two) times daily with a meal.  . clonazePAM (KLONOPIN) 0.5 MG tablet Take 0.5 mg by mouth 2 (two) times daily as needed for anxiety.  . dicyclomine (BENTYL) 10 MG capsule Take 10 mg by mouth 2 (two) times daily. Patient states that she started this 3 weeks ago  . loperamide (IMODIUM) 2 MG capsule Take 2 mg by mouth as needed for diarrhea or loose stools.  Marland Kitchen losartan-hydrochlorothiazide (HYZAAR) 50-12.5 MG per tablet Take 1 tablet by mouth daily.  Marland Kitchen trolamine salicylate (ASPERCREME) 10 % cream Apply 1 application topically as  needed. Apply to painful areas around hip and back  . Budesonide 9 MG TB24 Take 9 mg by mouth.  . cholecalciferol (VITAMIN D) 1000 UNITS tablet Take 2,000 Units by mouth daily.  . [DISCONTINUED] acetaminophen (TYLENOL) 500 MG tablet Take 1,000 mg by mouth daily as needed for moderate pain.  . [DISCONTINUED] Cyanocobalamin (VITAMIN B-12 PO) Take 1 tablet by mouth as needed.   . [DISCONTINUED] loperamide (IMODIUM) 2 MG capsule Take by mouth as needed for diarrhea or loose stools.  . [DISCONTINUED] loperamide (IMODIUM) 2 MG capsule Take 1 capsule (2 mg total) by mouth daily before breakfast. (Patient not taking: Reported on 04/24/2014)     Objective: Blood pressure 128/72, pulse 74, temperature 98.3 F (36.8 C), temperature source Oral, resp. rate 18, height 5\' 5"  (1.651 m), weight 211 lb 9.6 oz (95.981 kg). Patient is alert and in no acute distress. Conjunctiva is pink. Sclera is nonicteric Oropharyngeal mucosa is normal. No neck masses or thyromegaly noted. Cardiac exam with regular rhythm normal S1 and S2. No murmur or gallop noted. Lungs are clear to auscultation. Abdomen is full. Bowel sounds are normal. On palpation abdomen is soft with mild tenderness in LLQ. No organomegaly or masses.  No LE edema or clubbing noted.    Assessment:  #1. Diarrhea. She has lymphocytic colitis. In addition she appears to have irritable bowel syndrome. B12 deficiency may also be contribution to her diarrhea. She has done much better since taking dicyclomine which she is tolerating well. #  2. Lymphocytic colitis. She was treated with Uceris for 6-7 weeks. It remains to be seen whether or not she is in remission. Unless symptom control is sustained she mentioned flexible sigmoidoscopy with repeat biopsies and if she is not in remission she will need other therapies. #3. Fecal incontinence appeared to be secondary to urgency and diarrhea and not true incontinence due to sphincter  dysfunction.  Plan:  Continue dicyclomine 10 mg before breakfast and lunch daily. Imodium 2 mg by mouth daily when necessary. Benefiber 4 g by mouth daily at bedtime. Patient will keep symptom diary as to frequency of loose stools and incontinence until next office visit in 4 months.

## 2014-04-24 NOTE — Patient Instructions (Signed)
Keep symptom diary as to frequency of loose stools and many have urgency/accident; also keep record of foods that she had eaten when you experience urgency and accident

## 2014-05-22 DIAGNOSIS — D519 Vitamin B12 deficiency anemia, unspecified: Secondary | ICD-10-CM | POA: Diagnosis not present

## 2014-05-24 DIAGNOSIS — D519 Vitamin B12 deficiency anemia, unspecified: Secondary | ICD-10-CM | POA: Diagnosis not present

## 2014-05-31 DIAGNOSIS — D519 Vitamin B12 deficiency anemia, unspecified: Secondary | ICD-10-CM | POA: Diagnosis not present

## 2014-06-06 DIAGNOSIS — D519 Vitamin B12 deficiency anemia, unspecified: Secondary | ICD-10-CM | POA: Diagnosis not present

## 2014-06-07 DIAGNOSIS — R928 Other abnormal and inconclusive findings on diagnostic imaging of breast: Secondary | ICD-10-CM | POA: Diagnosis not present

## 2014-06-07 DIAGNOSIS — Z853 Personal history of malignant neoplasm of breast: Secondary | ICD-10-CM | POA: Diagnosis not present

## 2014-06-07 DIAGNOSIS — Z9889 Other specified postprocedural states: Secondary | ICD-10-CM | POA: Diagnosis not present

## 2014-06-13 DIAGNOSIS — D519 Vitamin B12 deficiency anemia, unspecified: Secondary | ICD-10-CM | POA: Diagnosis not present

## 2014-06-21 DIAGNOSIS — D0510 Intraductal carcinoma in situ of unspecified breast: Secondary | ICD-10-CM | POA: Diagnosis not present

## 2014-07-04 DIAGNOSIS — D519 Vitamin B12 deficiency anemia, unspecified: Secondary | ICD-10-CM | POA: Diagnosis not present

## 2014-08-07 DIAGNOSIS — R000 Tachycardia, unspecified: Secondary | ICD-10-CM | POA: Diagnosis not present

## 2014-08-07 DIAGNOSIS — I517 Cardiomegaly: Secondary | ICD-10-CM | POA: Diagnosis present

## 2014-08-07 DIAGNOSIS — Z91040 Latex allergy status: Secondary | ICD-10-CM | POA: Diagnosis not present

## 2014-08-07 DIAGNOSIS — N10 Acute tubulo-interstitial nephritis: Secondary | ICD-10-CM | POA: Diagnosis not present

## 2014-08-07 DIAGNOSIS — I10 Essential (primary) hypertension: Secondary | ICD-10-CM | POA: Diagnosis present

## 2014-08-07 DIAGNOSIS — R739 Hyperglycemia, unspecified: Secondary | ICD-10-CM | POA: Diagnosis present

## 2014-08-07 DIAGNOSIS — J90 Pleural effusion, not elsewhere classified: Secondary | ICD-10-CM | POA: Diagnosis not present

## 2014-08-07 DIAGNOSIS — R1110 Vomiting, unspecified: Secondary | ICD-10-CM | POA: Diagnosis not present

## 2014-08-07 DIAGNOSIS — N390 Urinary tract infection, site not specified: Secondary | ICD-10-CM | POA: Diagnosis not present

## 2014-08-07 DIAGNOSIS — Z79899 Other long term (current) drug therapy: Secondary | ICD-10-CM | POA: Diagnosis not present

## 2014-08-07 DIAGNOSIS — Z888 Allergy status to other drugs, medicaments and biological substances status: Secondary | ICD-10-CM | POA: Diagnosis not present

## 2014-08-07 DIAGNOSIS — N119 Chronic tubulo-interstitial nephritis, unspecified: Secondary | ICD-10-CM | POA: Diagnosis not present

## 2014-08-07 DIAGNOSIS — Z882 Allergy status to sulfonamides status: Secondary | ICD-10-CM | POA: Diagnosis not present

## 2014-08-07 DIAGNOSIS — Z853 Personal history of malignant neoplasm of breast: Secondary | ICD-10-CM | POA: Diagnosis not present

## 2014-08-07 DIAGNOSIS — R509 Fever, unspecified: Secondary | ICD-10-CM | POA: Diagnosis not present

## 2014-08-23 DIAGNOSIS — A4151 Sepsis due to Escherichia coli [E. coli]: Secondary | ICD-10-CM | POA: Diagnosis not present

## 2014-08-23 DIAGNOSIS — M545 Low back pain: Secondary | ICD-10-CM | POA: Diagnosis not present

## 2014-08-23 DIAGNOSIS — R5383 Other fatigue: Secondary | ICD-10-CM | POA: Diagnosis not present

## 2014-08-23 DIAGNOSIS — N10 Acute tubulo-interstitial nephritis: Secondary | ICD-10-CM | POA: Diagnosis not present

## 2014-08-29 ENCOUNTER — Ambulatory Visit (INDEPENDENT_AMBULATORY_CARE_PROVIDER_SITE_OTHER): Payer: No Typology Code available for payment source | Admitting: Internal Medicine

## 2014-08-31 ENCOUNTER — Encounter (INDEPENDENT_AMBULATORY_CARE_PROVIDER_SITE_OTHER): Payer: Self-pay | Admitting: Internal Medicine

## 2014-08-31 ENCOUNTER — Ambulatory Visit (INDEPENDENT_AMBULATORY_CARE_PROVIDER_SITE_OTHER): Payer: Medicare Other | Admitting: Internal Medicine

## 2014-08-31 VITALS — BP 142/70 | HR 80 | Temp 98.0°F | Ht 65.0 in | Wt 209.0 lb

## 2014-08-31 DIAGNOSIS — K5289 Other specified noninfective gastroenteritis and colitis: Secondary | ICD-10-CM

## 2014-08-31 DIAGNOSIS — K52839 Microscopic colitis, unspecified: Secondary | ICD-10-CM | POA: Insufficient documentation

## 2014-08-31 NOTE — Patient Instructions (Signed)
Continue the Dicyclomine BID. Imodium as needed. OV 6 months.

## 2014-08-31 NOTE — Progress Notes (Signed)
Subjective:    Patient ID: Sylvia Barnett, female    DOB: August 19, 1945, 69 y.o.   MRN: [id number]  HPI Presents today as a follow up. She says she was admitted to [facility] 08/09/2014  with a UTI and a virus. She had nausea and vomiting. She had diarrhea. C diff in hospital was negative. She says she feels better this week. She just finished Macrobid this week for her UTI. She tells me her colon does not feel right. She had a BM but loose and mushy. She has urgency. Today at lunch she had a very loose stool.  She takes Imodium as needed. She has not taken any in 3 weeks.  She did not take Imodium in the hospital. She tells me her stools are hard at first and as the day goes on they loosen up.  Her appetite has remained good.  On average she has 3 BMs a day.       11/10/2013 Colonoscopy  Indications: Patient is 69 year old Caucasian female who presents with one-year history of nonbloody diarrhea not associated with weight loss. He has history of colonic polyp and family history of colon carcinoma and sister who was 53 at the time of diagnosis. Prior colonoscopies were 10 and 5 years ago. She had 1 polyp removed on her first colonoscopy. Examination performed to cecum. Two small polyps removed from ascending colon and submitted together(one was cold snared and the other one was ablated via cold biopsy). No evidence of endoscopic colitis. I'll see taken from mucosa of sigmoid colon to rule out microscopic or collagenous colitis. Mild sigmoid colon diverticulosis.  Both polyps are tubular adenomas. Biopsy from mucosa of sigmoid colon shows lymphocytic colitis. Results reviewed with patient. Will treat with butesonide for 6 weeks Office visit in 8 weeks Patient asked to keep stool diary until office visit Report to PCP     Review of Systems Past Medical History  Diagnosis Date  . Complication of anesthesia     Has a small throat  . Hypertension   . Arthritis     Right hip  .  Anxiety   . Depression   . Cancer 2004     Breast Lumpectomy  . Hypercholesteremia     Past Surgical History  Procedure Laterality Date  . Joint replacement  08-2010    Left total knee  . Abdominal hysterectomy  2002  . Appendectomy  1949  . Wrist surgery Bilateral 03-2010  . Wrist surgery  2008    Right  . Knee arthroscopy  2010    Right  . Knee arthroscopy  2003    Left  . Bladder tack  2002  . Breast lumpectomy  2004    Left  . Breast lumpectomy  2004    Left  . Breast lumpectomy  2006    Left  . Biopsy breast  2010    Left  . Breast lumpectomy  2000    Left  . Eye surgery  2011    Catarct Right  . Eye surgery  08-2011    Right Yag procedure  . Bunionectomy  1994    Right  . Total knee arthroplasty  11/03/2011    Procedure: TOTAL KNEE ARTHROPLASTY;  Surgeon: Gearlean Alf, MD;  Location: [facility];  Service: Orthopedics;  Laterality: Right;  . Tubal ligation    . Cataract extraction w/phaco Left 08/18/2013    Procedure: CATARACT EXTRACTION PHACO AND INTRAOCULAR LENS PLACEMENT (IOC);  Surgeon: Tonny Branch, MD;  Location: [facility];  Service: Ophthalmology;  Laterality: Left;  CDE: 8.62  . Colonoscopy N/A 11/10/2013    Procedure: COLONOSCOPY;  Surgeon: Rogene Houston, MD;  Location: [facility];  Service: Endoscopy;  Laterality: N/A;  240    Allergies  Allergen Reactions  . Latex Swelling and Other (See Comments)    blisters  . Sulfa Antibiotics Rash    Current Outpatient Prescriptions on File Prior to Visit  Medication Sig Dispense Refill  . atorvastatin (LIPITOR) 10 MG tablet Take 10 mg by mouth at bedtime.    . calcium carbonate (OS-CAL) 600 MG TABS tablet Take 600 mg by mouth as needed.     . clonazePAM (KLONOPIN) 0.5 MG tablet Take 0.5 mg by mouth 2 (two) times daily as needed for anxiety.    . dicyclomine (BENTYL) 10 MG capsule Take 1 capsule (10 mg total) by mouth 2 (two) times daily. Patient states that she started this 3 weeks ago 60 capsule 5  .  loperamide (IMODIUM) 2 MG capsule Take 2 mg by mouth as needed for diarrhea or loose stools.    Marland Kitchen losartan-hydrochlorothiazide (HYZAAR) 50-12.5 MG per tablet Take 1 tablet by mouth daily.    Marland Kitchen trolamine salicylate (ASPERCREME) 10 % cream Apply 1 application topically as needed. Apply to painful areas around hip and back    . cholecalciferol (VITAMIN D) 1000 UNITS tablet Take 2,000 Units by mouth daily.    . Wheat Dextrin (BENEFIBER DRINK MIX) PACK Take 4 g by mouth at bedtime. (Patient not taking: Reported on 08/31/2014)     No current facility-administered medications on file prior to visit.        Objective:   Physical Exam Blood pressure 142/70, pulse 80, temperature 98 F (36.7 C), height 5\' 5"  (1.651 m), weight 209 lb (94.802 kg). Alert and oriented. Skin warm and dry. Oral mucosa is moist.   . Sclera anicteric, conjunctivae is pink. Thyroid not enlarged. No cervical lymphadenopathy. Lungs clear. Heart regular rate and rhythm.  Abdomen is soft. Bowel sounds are positive. No hepatomegaly. No abdominal masses felt. No tenderness.  No edema to lower extremities.        Assessment & Plan:  Microscopic colitis. She is having 3 stools a day. Most are soft. She is taking the Dicyclomine twice a day. Continue the Imodium as needed. OV in 6 months

## 2014-09-09 DIAGNOSIS — R5383 Other fatigue: Secondary | ICD-10-CM | POA: Diagnosis not present

## 2014-09-09 DIAGNOSIS — A4151 Sepsis due to Escherichia coli [E. coli]: Secondary | ICD-10-CM | POA: Diagnosis not present

## 2014-09-09 DIAGNOSIS — N10 Acute tubulo-interstitial nephritis: Secondary | ICD-10-CM | POA: Diagnosis not present

## 2014-09-09 DIAGNOSIS — M545 Low back pain: Secondary | ICD-10-CM | POA: Diagnosis not present

## 2014-09-15 DIAGNOSIS — R300 Dysuria: Secondary | ICD-10-CM | POA: Diagnosis not present

## 2014-09-19 DIAGNOSIS — Z96651 Presence of right artificial knee joint: Secondary | ICD-10-CM | POA: Diagnosis not present

## 2014-09-19 DIAGNOSIS — Z471 Aftercare following joint replacement surgery: Secondary | ICD-10-CM | POA: Diagnosis not present

## 2014-09-19 DIAGNOSIS — Z96653 Presence of artificial knee joint, bilateral: Secondary | ICD-10-CM | POA: Diagnosis not present

## 2014-09-19 DIAGNOSIS — M5136 Other intervertebral disc degeneration, lumbar region: Secondary | ICD-10-CM | POA: Diagnosis not present

## 2014-09-19 DIAGNOSIS — M25551 Pain in right hip: Secondary | ICD-10-CM | POA: Diagnosis not present

## 2014-09-21 DIAGNOSIS — M545 Low back pain: Secondary | ICD-10-CM | POA: Diagnosis not present

## 2014-09-21 DIAGNOSIS — M7061 Trochanteric bursitis, right hip: Secondary | ICD-10-CM | POA: Diagnosis not present

## 2014-10-17 DIAGNOSIS — Z961 Presence of intraocular lens: Secondary | ICD-10-CM | POA: Diagnosis not present

## 2014-10-17 DIAGNOSIS — H35373 Puckering of macula, bilateral: Secondary | ICD-10-CM | POA: Diagnosis not present

## 2014-10-17 DIAGNOSIS — H524 Presbyopia: Secondary | ICD-10-CM | POA: Diagnosis not present

## 2014-11-27 ENCOUNTER — Ambulatory Visit (INDEPENDENT_AMBULATORY_CARE_PROVIDER_SITE_OTHER): Payer: No Typology Code available for payment source | Admitting: Internal Medicine

## 2014-12-05 ENCOUNTER — Encounter (INDEPENDENT_AMBULATORY_CARE_PROVIDER_SITE_OTHER): Payer: Self-pay | Admitting: Internal Medicine

## 2014-12-05 ENCOUNTER — Ambulatory Visit (INDEPENDENT_AMBULATORY_CARE_PROVIDER_SITE_OTHER): Payer: Medicare Other | Admitting: Internal Medicine

## 2014-12-05 VITALS — BP 140/92 | HR 78 | Temp 98.1°F | Resp 18 | Ht 65.0 in | Wt 207.6 lb

## 2014-12-05 DIAGNOSIS — K52832 Lymphocytic colitis: Secondary | ICD-10-CM | POA: Diagnosis not present

## 2014-12-05 DIAGNOSIS — K589 Irritable bowel syndrome without diarrhea: Secondary | ICD-10-CM

## 2014-12-05 MED ORDER — DICYCLOMINE HCL 10 MG PO CAPS: 10.0000 mg | ORAL_CAPSULE | Freq: Three times a day (TID) | ORAL | Status: DC

## 2014-12-05 NOTE — Progress Notes (Signed)
Presenting complaint;  Abdominal cramping and diarrhea.  Subjective:  Patient is 69 year old Caucasian female who is here for scheduled visit. I last saw in April 2016 and she was seen by Ms. Setzer NP on 08/31/2014. Last colonoscopy was in October 2015. She has microscopic/lymphocytic colitis. She was given prescription for budesonide or Entocort EC but she could not afford this medication. She was given samples of Uceris earlier this year and she did not see any benefit after 6 weeks of therapy. Now she returns with postprandial abdominal cramping and 3-4 bowel movements. Stool consistency is loose to semi-formed. Most of her bowel movements occur within and out of meals. She denies melena or rectal bleeding. She was hospitalized in July 2016 at [facility] for urinary tract infection. She states she had black stool 1 day but it was guaiac negative. She also had negative C. difficile file in the hospital. Her weight is stable. She feels her blood pressure medication was causing her symptoms. She stopped the medication and felt better. She is using Imodium on when necessary basis. Yesterday she only had 2 stools. She has had a few accidents and at times passes mucus per rectum. She denies nocturnal bowel movements. She will check with Dr. Quintin Alto she could be switched to another blood pressure medication.   Current Medications: Outpatient Encounter Prescriptions as of 12/05/2014  Medication Sig  . atorvastatin (LIPITOR) 10 MG tablet Take 10 mg by mouth at bedtime.  . calcium carbonate (OS-CAL) 600 MG TABS tablet Take 600 mg by mouth as needed.   . cholecalciferol (VITAMIN D) 1000 UNITS tablet Take 2,000 Units by mouth daily.  . clonazePAM (KLONOPIN) 0.5 MG tablet Take 0.5 mg by mouth 2 (two) times daily as needed for anxiety.  . dicyclomine (BENTYL) 10 MG capsule Take 1 capsule (10 mg total) by mouth 2 (two) times daily. Patient states that she started this 3 weeks ago  . loperamide (IMODIUM) 2 MG  capsule Take 2 mg by mouth as needed for diarrhea or loose stools.  . trolamine salicylate (ASPERCREME) 10 % cream Apply 1 application topically as needed. Apply to painful areas around hip and back  . losartan-hydrochlorothiazide (HYZAAR) 50-12.5 MG per tablet Take 1 tablet by mouth daily.  . Wheat Dextrin (BENEFIBER DRINK MIX) PACK Take 4 g by mouth at bedtime. (Patient not taking: Reported on 08/31/2014)   No facility-administered encounter medications on file as of 12/05/2014.     Objective: Blood pressure 140/92, pulse 78, temperature 98.1 F (36.7 C), temperature source Oral, resp. rate 18, height 5\' 5"  (1.651 m), weight 207 lb 9.6 oz (94.167 kg).  Conjunctiva is pink. Sclera is nonicteric Oropharyngeal mucosa is normal. No neck masses or thyromegaly noted. Cardiac exam with regular rhythm normal S1 and S2. No murmur or gallop noted. Lungs are clear to auscultation. Abdomen is full. Bowel sounds are normal. On palpation abdomen is soft with mild tenderness at LLQ. No organomegaly or masses. No LE edema or clubbing noted.   Assessment:  #1. Patient's symptoms of postprandial abdominal cramping and diarrhea appear to be typical of irritable bowel syndrome. Stool frequency has decreased with Imodium. However she is not taking dicyclomine correctly. #2. History of lymphocytic colitis. I do not believe her symptoms are secondary to lymphocytic colitis. Earlier this year she was treated with Uceris without any benefit. If current symptoms do not respond to therapy for IBS will consider therapy for lymphocytic colitis.  Plan:  Take Imodium 2 mg by mouth daily before breakfast.  Increase dicyclomine to 10 mg by mouth 30 minutes before each meal. Can use Dulcolax suppository for constipation on as needed basis in which case she can also decrease dicyclomine dose to an milligrams twice a day. Patient will call with progress report in 4 weeks and return for office visit in 3  months.

## 2014-12-05 NOTE — Patient Instructions (Signed)
Remember to take dicyclomine at least 30 minutes before each meal. Take Imodium OTC 2 mg at least 30 minutes before breakfast. Can use Dulcolax suppository on as needed basis for constipation. Please call with progress report in 4 weeks. Keep stool diary for 2-3 weeks prior to next office visit in 3 months.

## 2014-12-06 ENCOUNTER — Other Ambulatory Visit (INDEPENDENT_AMBULATORY_CARE_PROVIDER_SITE_OTHER): Payer: Self-pay | Admitting: *Deleted

## 2014-12-06 ENCOUNTER — Telehealth (INDEPENDENT_AMBULATORY_CARE_PROVIDER_SITE_OTHER): Payer: Self-pay | Admitting: *Deleted

## 2014-12-06 MED ORDER — DICYCLOMINE HCL 10 MG PO CAPS: 10.0000 mg | ORAL_CAPSULE | Freq: Three times a day (TID) | ORAL | Status: DC

## 2014-12-06 NOTE — Telephone Encounter (Signed)
Patient needed for this prescription to go to her mail order.

## 2014-12-06 NOTE — Telephone Encounter (Signed)
Patient ask that this go to her mail order pharmacy.

## 2014-12-11 DIAGNOSIS — I10 Essential (primary) hypertension: Secondary | ICD-10-CM | POA: Diagnosis not present

## 2014-12-11 DIAGNOSIS — E7800 Pure hypercholesterolemia, unspecified: Secondary | ICD-10-CM | POA: Diagnosis not present

## 2014-12-11 DIAGNOSIS — N3000 Acute cystitis without hematuria: Secondary | ICD-10-CM | POA: Diagnosis not present

## 2014-12-19 DIAGNOSIS — F411 Generalized anxiety disorder: Secondary | ICD-10-CM | POA: Diagnosis not present

## 2014-12-19 DIAGNOSIS — G629 Polyneuropathy, unspecified: Secondary | ICD-10-CM | POA: Diagnosis not present

## 2014-12-19 DIAGNOSIS — K6389 Other specified diseases of intestine: Secondary | ICD-10-CM | POA: Diagnosis not present

## 2014-12-19 DIAGNOSIS — Z13220 Encounter for screening for lipoid disorders: Secondary | ICD-10-CM | POA: Diagnosis not present

## 2014-12-19 DIAGNOSIS — I10 Essential (primary) hypertension: Secondary | ICD-10-CM | POA: Diagnosis not present

## 2014-12-19 DIAGNOSIS — R609 Edema, unspecified: Secondary | ICD-10-CM | POA: Diagnosis not present

## 2015-01-09 ENCOUNTER — Telehealth (INDEPENDENT_AMBULATORY_CARE_PROVIDER_SITE_OTHER): Payer: Self-pay | Admitting: *Deleted

## 2015-01-09 NOTE — Telephone Encounter (Signed)
Progress report   She is doing better taking 3 pills a day of dicyclomine dose   If any questions can call her at [phone]

## 2015-01-10 ENCOUNTER — Telehealth (INDEPENDENT_AMBULATORY_CARE_PROVIDER_SITE_OTHER): Payer: Self-pay | Admitting: *Deleted

## 2015-01-10 NOTE — Telephone Encounter (Signed)
Patient was called. In reviewing the side effects of the Dicyclomine , there is nothing that states that yeast infection is one of them. Also spoke with a Pharmacist who states that there is a less than 1% chance of this medication causing a yeast infection.  Patient states that 2 other medications were changed and they be the culprit, she will see her PCP tomorrow.

## 2015-01-10 NOTE — Telephone Encounter (Addendum)
Patient taking dicyclomine tid, wants to know if there is a side effect of yeast infection with taking 3 tablets -- she done fine with 2 but has devloped yeast infection after starting 3, please call [phone]

## 2015-01-11 NOTE — Telephone Encounter (Signed)
It appears patient is doing well therefore follow-up call not needed.

## 2015-01-16 DIAGNOSIS — I10 Essential (primary) hypertension: Secondary | ICD-10-CM | POA: Diagnosis not present

## 2015-01-16 DIAGNOSIS — N3000 Acute cystitis without hematuria: Secondary | ICD-10-CM | POA: Diagnosis not present

## 2015-01-16 DIAGNOSIS — M545 Low back pain: Secondary | ICD-10-CM | POA: Diagnosis not present

## 2015-02-07 DIAGNOSIS — Z87448 Personal history of other diseases of urinary system: Secondary | ICD-10-CM | POA: Diagnosis not present

## 2015-02-07 DIAGNOSIS — N390 Urinary tract infection, site not specified: Secondary | ICD-10-CM | POA: Diagnosis not present

## 2015-02-07 DIAGNOSIS — Z0000 Encounter for general adult medical examination without abnormal findings: Secondary | ICD-10-CM | POA: Diagnosis not present

## 2015-02-13 DIAGNOSIS — D239 Other benign neoplasm of skin, unspecified: Secondary | ICD-10-CM | POA: Diagnosis not present

## 2015-02-13 DIAGNOSIS — L821 Other seborrheic keratosis: Secondary | ICD-10-CM | POA: Diagnosis not present

## 2015-02-13 DIAGNOSIS — Z85820 Personal history of malignant melanoma of skin: Secondary | ICD-10-CM | POA: Diagnosis not present

## 2015-02-13 DIAGNOSIS — L570 Actinic keratosis: Secondary | ICD-10-CM | POA: Diagnosis not present

## 2015-03-20 ENCOUNTER — Encounter (INDEPENDENT_AMBULATORY_CARE_PROVIDER_SITE_OTHER): Payer: Self-pay | Admitting: Internal Medicine

## 2015-03-20 ENCOUNTER — Ambulatory Visit (INDEPENDENT_AMBULATORY_CARE_PROVIDER_SITE_OTHER): Payer: Medicare Other | Admitting: Internal Medicine

## 2015-03-20 VITALS — BP 130/90 | HR 66 | Temp 97.6°F | Resp 18 | Ht 65.0 in | Wt 206.1 lb

## 2015-03-20 DIAGNOSIS — K52832 Lymphocytic colitis: Secondary | ICD-10-CM | POA: Diagnosis not present

## 2015-03-20 DIAGNOSIS — K589 Irritable bowel syndrome without diarrhea: Secondary | ICD-10-CM | POA: Diagnosis not present

## 2015-03-20 MED ORDER — CHOLESTYRAMINE 4 G PO PACK: 4.0000 g | PACK | Freq: Two times a day (BID) | ORAL | Status: DC

## 2015-03-20 NOTE — Patient Instructions (Signed)
Remember to take cholestyramine 2 hours before or after taking other medications. Can start with 2 g twice daily and increase to 4 g twice daily if needed. Call with progress report in 3-4 weeks. Use Imodium OTC on as-needed basis.

## 2015-03-20 NOTE — Progress Notes (Signed)
Presenting complaint;  Follow-up for diarrhea and bloating.  Subjective:  Patient is 70 year old Caucasian female who is here for scheduled visit. She was last seen on 12/05/2014. She states she is taking dicyclomine 2-3 times a day. She continues to have 2-4 bowel movements per day. 50% of her stools are formed and rest loose. She has bloating and postprandial cramping. At times she wakes up in the middle of night with bloating and cramping. When her stool is loose its yellow. She does not have nocturnal bowel movement. Her appetite is good. Her weight has been stable. She says she's had 3 accidents since her last visit. On one occasion she constipated when she thinks was due to trimethoprim. She is taking Imodium every other day. Family history is negative for CRC.   Current Medications: Outpatient Encounter Prescriptions as of 03/20/2015  Medication Sig  . atorvastatin (LIPITOR) 10 MG tablet Take 10 mg by mouth at bedtime.  . calcium carbonate (OS-CAL) 600 MG TABS tablet Take 600 mg by mouth as needed.   . cholecalciferol (VITAMIN D) 1000 UNITS tablet Take 2,000 Units by mouth daily.  . clonazePAM (KLONOPIN) 0.5 MG tablet Take 0.5 mg by mouth 2 (two) times daily as needed for anxiety.  . dicyclomine (BENTYL) 10 MG capsule Take 1 capsule (10 mg total) by mouth 3 (three) times daily before meals. Patient states that she started this 3 weeks ago  . hydrochlorothiazide (HYDRODIURIL) 25 MG tablet Take 25 mg by mouth daily.   Marland Kitchen loperamide (IMODIUM) 2 MG capsule Take 2 mg by mouth daily before breakfast.  . losartan-hydrochlorothiazide (HYZAAR) 50-12.5 MG per tablet Take 1 tablet by mouth daily.  . Menthol, Topical Analgesic, (ICY HOT) 5 % PADS Patient wears as needed for pain.  . [DISCONTINUED] trolamine salicylate (ASPERCREME) 10 % cream Apply 1 application topically as needed. Reported on 03/20/2015  . [DISCONTINUED] Wheat Dextrin (BENEFIBER DRINK MIX) PACK Take 4 g by mouth at bedtime. (Patient  not taking: Reported on 08/31/2014)   No facility-administered encounter medications on file as of 03/20/2015.     Objective: Blood pressure 130/90, pulse 66, temperature 97.6 F (36.4 C), temperature source Oral, resp. rate 18, height 5\' 5"  (1.651 m), weight 206 lb 1.6 oz (93.486 kg). Patient is alert and in no acute distress. Conjunctiva is pink. Sclera is nonicteric Oropharyngeal mucosa is normal. No neck masses or thyromegaly noted. Cardiac exam with regular rhythm normal S1 and S2. No murmur or gallop noted. Lungs are clear to auscultation. Abdomen is full. Bowel sounds are normal. On palpation abdomen is soft and nontender without organomegaly or masses. Percussion note is normal.  No LE edema or clubbing noted.  Labs/studies Results:  Last colonoscopy was in October 2015 with removal of 2 small tubular adenomas and sigmoid colon biopsy revealed lymphocytic colitis.    Assessment:  #1. Irritable bowel syndrome. She has typical symptoms but has not responded to therapy. Therefore needs to screen for celiac disease. It remains to be seen whether or not lymphocytic colitis is the reason for poor response to therapy. #2. Lymphocytic colitis. She did not respond to 6 weeks of Uceris. If her symptoms do not respond she may benefit from short course of prednisone. #3. History of colonic adenomas. Next colonoscopy due in October 2020.  Plan:  Celiac antibody panel. Cholestyramine 2 to 4 g by mouth twice a day. Patient advised to take this medication 2 hours before or after taking her other medications. She was also given samples of  Ibgard which she will not use while on cholestyramine. Use Imodium OTC on as-needed basis. Patient will call with progress report in 3 to 4 weeks. Office visit in 4 months.

## 2015-03-21 LAB — TISSUE TRANSGLUTAMINASE, IGA: Tissue Transglutaminase Ab, IgA: 1 U/mL (ref <4)

## 2015-03-21 LAB — RETICULIN ANTIBODIES, IGA W TITER: Reticulin Ab, IgA: NEGATIVE

## 2015-03-21 LAB — GLIADIN ANTIBODIES, SERUM
Gliadin IgA: 4 Units (ref <20)
Gliadin IgG: 2 Units (ref <20)

## 2015-03-23 ENCOUNTER — Telehealth (INDEPENDENT_AMBULATORY_CARE_PROVIDER_SITE_OTHER): Payer: Self-pay | Admitting: Internal Medicine

## 2015-03-23 DIAGNOSIS — N3946 Mixed incontinence: Secondary | ICD-10-CM | POA: Diagnosis not present

## 2015-03-23 DIAGNOSIS — N8111 Cystocele, midline: Secondary | ICD-10-CM | POA: Diagnosis not present

## 2015-03-23 DIAGNOSIS — N3020 Other chronic cystitis without hematuria: Secondary | ICD-10-CM | POA: Diagnosis not present

## 2015-03-23 DIAGNOSIS — Z0000 Encounter for general adult medical examination without abnormal findings: Secondary | ICD-10-CM | POA: Diagnosis not present

## 2015-03-23 DIAGNOSIS — R350 Frequency of micturition: Secondary | ICD-10-CM | POA: Diagnosis not present

## 2015-03-23 NOTE — Telephone Encounter (Signed)
Ms. Sylvia Barnett left a message saying the Rx that Dr. Laural Golden sent to the [facility] after her last visit on March 7th, 2017...she was told she'd be charged $366 to receive it. She's unsure if he sent a 90 day supply or what but she can't afford that amount. She'd rather Dr. Laural Golden send a 30 day supply of the medication to her local [place] so she can see if it will be cheaper. Please call the pt if needed.  Pt's ph# [phone] or [phone] Thank you.

## 2015-03-26 NOTE — Telephone Encounter (Signed)
Ms. Srader left another message saying the Rx that was sent to her [place] cost [phone] which is still expensive for her. She's wondering if she can take the "angry gut" Rx instead of what's been sent. She'd like a phone call regarding this.  Pt's ph# [phone] Thank you.

## 2015-03-26 NOTE — Telephone Encounter (Signed)
Needs to be addressed with Dr.Rehman prior to any call backs.

## 2015-03-26 NOTE — Telephone Encounter (Signed)
A prescription for the Sylvia Barnett was called to the patient's [facility] in [place]. A message has been left on the patient's phone both home and cell. If it is still to expensive , I ask that she call our office and we would see what the alternative would be.

## 2015-03-27 NOTE — Telephone Encounter (Signed)
Per Dr.Rehman - see if patient can take the Questran 1 week.  The Angry Gut is fine - IB Gard. Patient will be called and made aware 03/28/2015.

## 2015-03-28 NOTE — Telephone Encounter (Signed)
Pt called back saying she can't afford to take the Questran due to the cost of $122 through her insurance.  She wasn't taking fiber before but she's began taking Citracel Fiber and is wondering if this will take the place of the [place]. She has IB Gard samples that were given to her so she also didn't know if she should begin taking that today in addition to the Citracel Fiber. She'd like a phone call regarding this.  Pt's ph# [phone] and [phone] Thank you.

## 2015-03-28 NOTE — Telephone Encounter (Signed)
Talked with the patient. She states that she is now taking her fiber pill. She states that she is going to start the [place] . She will call us with a progress report in 1 - 2 weeks.

## 2015-04-17 DIAGNOSIS — S40022A Contusion of left upper arm, initial encounter: Secondary | ICD-10-CM | POA: Diagnosis not present

## 2015-04-17 DIAGNOSIS — S40812A Abrasion of left upper arm, initial encounter: Secondary | ICD-10-CM | POA: Diagnosis not present

## 2015-04-17 DIAGNOSIS — S7002XA Contusion of left hip, initial encounter: Secondary | ICD-10-CM | POA: Diagnosis not present

## 2015-04-17 DIAGNOSIS — Z79899 Other long term (current) drug therapy: Secondary | ICD-10-CM | POA: Diagnosis not present

## 2015-06-28 DIAGNOSIS — H35373 Puckering of macula, bilateral: Secondary | ICD-10-CM | POA: Diagnosis not present

## 2015-07-30 DIAGNOSIS — K580 Irritable bowel syndrome with diarrhea: Secondary | ICD-10-CM | POA: Diagnosis not present

## 2015-07-30 DIAGNOSIS — Z6835 Body mass index (BMI) 35.0-35.9, adult: Secondary | ICD-10-CM | POA: Diagnosis not present

## 2015-07-30 DIAGNOSIS — M818 Other osteoporosis without current pathological fracture: Secondary | ICD-10-CM | POA: Diagnosis not present

## 2015-07-30 DIAGNOSIS — M5432 Sciatica, left side: Secondary | ICD-10-CM | POA: Diagnosis not present

## 2015-07-30 DIAGNOSIS — F411 Generalized anxiety disorder: Secondary | ICD-10-CM | POA: Diagnosis not present

## 2015-07-30 DIAGNOSIS — I10 Essential (primary) hypertension: Secondary | ICD-10-CM | POA: Diagnosis not present

## 2015-07-30 DIAGNOSIS — E7800 Pure hypercholesterolemia, unspecified: Secondary | ICD-10-CM | POA: Diagnosis not present

## 2015-07-30 DIAGNOSIS — G629 Polyneuropathy, unspecified: Secondary | ICD-10-CM | POA: Diagnosis not present

## 2015-07-31 ENCOUNTER — Ambulatory Visit (INDEPENDENT_AMBULATORY_CARE_PROVIDER_SITE_OTHER): Payer: Medicare Other | Admitting: Internal Medicine

## 2015-10-18 DIAGNOSIS — H35373 Puckering of macula, bilateral: Secondary | ICD-10-CM | POA: Diagnosis not present

## 2015-10-18 DIAGNOSIS — Z961 Presence of intraocular lens: Secondary | ICD-10-CM | POA: Diagnosis not present

## 2015-10-18 DIAGNOSIS — H524 Presbyopia: Secondary | ICD-10-CM | POA: Diagnosis not present

## 2015-11-01 DIAGNOSIS — Z79899 Other long term (current) drug therapy: Secondary | ICD-10-CM | POA: Diagnosis not present

## 2015-11-01 DIAGNOSIS — E2839 Other primary ovarian failure: Secondary | ICD-10-CM | POA: Diagnosis not present

## 2015-11-01 DIAGNOSIS — M810 Age-related osteoporosis without current pathological fracture: Secondary | ICD-10-CM | POA: Diagnosis not present

## 2015-11-01 DIAGNOSIS — M8580 Other specified disorders of bone density and structure, unspecified site: Secondary | ICD-10-CM | POA: Insufficient documentation

## 2015-11-01 DIAGNOSIS — M85851 Other specified disorders of bone density and structure, right thigh: Secondary | ICD-10-CM | POA: Diagnosis not present

## 2015-11-01 DIAGNOSIS — E7800 Pure hypercholesterolemia, unspecified: Secondary | ICD-10-CM | POA: Diagnosis not present

## 2015-11-01 DIAGNOSIS — M85852 Other specified disorders of bone density and structure, left thigh: Secondary | ICD-10-CM | POA: Diagnosis not present

## 2015-11-01 DIAGNOSIS — I10 Essential (primary) hypertension: Secondary | ICD-10-CM | POA: Diagnosis not present

## 2015-11-01 DIAGNOSIS — K589 Irritable bowel syndrome without diarrhea: Secondary | ICD-10-CM | POA: Diagnosis not present

## 2015-11-01 DIAGNOSIS — Z853 Personal history of malignant neoplasm of breast: Secondary | ICD-10-CM | POA: Diagnosis not present

## 2015-11-01 DIAGNOSIS — Z8781 Personal history of (healed) traumatic fracture: Secondary | ICD-10-CM | POA: Diagnosis not present

## 2015-12-08 DIAGNOSIS — R05 Cough: Secondary | ICD-10-CM | POA: Diagnosis not present

## 2015-12-08 DIAGNOSIS — J0100 Acute maxillary sinusitis, unspecified: Secondary | ICD-10-CM | POA: Diagnosis not present

## 2015-12-08 DIAGNOSIS — Z6835 Body mass index (BMI) 35.0-35.9, adult: Secondary | ICD-10-CM | POA: Diagnosis not present

## 2015-12-10 DIAGNOSIS — Z6834 Body mass index (BMI) 34.0-34.9, adult: Secondary | ICD-10-CM | POA: Diagnosis not present

## 2015-12-10 DIAGNOSIS — F411 Generalized anxiety disorder: Secondary | ICD-10-CM | POA: Diagnosis not present

## 2015-12-10 DIAGNOSIS — F331 Major depressive disorder, recurrent, moderate: Secondary | ICD-10-CM | POA: Diagnosis not present

## 2016-01-14 DIAGNOSIS — K589 Irritable bowel syndrome without diarrhea: Secondary | ICD-10-CM

## 2016-01-14 HISTORY — DX: Irritable bowel syndrome, unspecified: K58.9

## 2016-01-18 DIAGNOSIS — I10 Essential (primary) hypertension: Secondary | ICD-10-CM | POA: Diagnosis not present

## 2016-01-18 DIAGNOSIS — E7800 Pure hypercholesterolemia, unspecified: Secondary | ICD-10-CM | POA: Diagnosis not present

## 2016-01-22 DIAGNOSIS — Z1389 Encounter for screening for other disorder: Secondary | ICD-10-CM | POA: Diagnosis not present

## 2016-01-22 DIAGNOSIS — M818 Other osteoporosis without current pathological fracture: Secondary | ICD-10-CM | POA: Diagnosis not present

## 2016-01-22 DIAGNOSIS — F411 Generalized anxiety disorder: Secondary | ICD-10-CM | POA: Diagnosis not present

## 2016-01-22 DIAGNOSIS — G629 Polyneuropathy, unspecified: Secondary | ICD-10-CM | POA: Diagnosis not present

## 2016-01-22 DIAGNOSIS — Z6835 Body mass index (BMI) 35.0-35.9, adult: Secondary | ICD-10-CM | POA: Diagnosis not present

## 2016-01-22 DIAGNOSIS — K580 Irritable bowel syndrome with diarrhea: Secondary | ICD-10-CM | POA: Diagnosis not present

## 2016-01-22 DIAGNOSIS — I10 Essential (primary) hypertension: Secondary | ICD-10-CM | POA: Diagnosis not present

## 2016-04-08 ENCOUNTER — Encounter (INDEPENDENT_AMBULATORY_CARE_PROVIDER_SITE_OTHER): Payer: Self-pay | Admitting: Internal Medicine

## 2016-04-08 ENCOUNTER — Ambulatory Visit (INDEPENDENT_AMBULATORY_CARE_PROVIDER_SITE_OTHER): Payer: Medicare Other | Admitting: Internal Medicine

## 2016-04-08 VITALS — BP 112/80 | HR 66 | Temp 98.3°F | Resp 18 | Ht 65.0 in | Wt 209.8 lb

## 2016-04-08 DIAGNOSIS — K580 Irritable bowel syndrome with diarrhea: Secondary | ICD-10-CM

## 2016-04-08 MED ORDER — DICYCLOMINE HCL 10 MG PO CAPS: 10.0000 mg | ORAL_CAPSULE | Freq: Three times a day (TID) | ORAL | 1 refills | Status: DC | PRN

## 2016-04-08 NOTE — Patient Instructions (Signed)
Stool diary for 4 weeks prior to next office visit in 3 months.

## 2016-04-08 NOTE — Progress Notes (Signed)
Presenting complaint;  Follow-up for diarrhea.  Database and Subjective:  Patient is 71 year old Caucasian female who has history of chronic diarrhea. She had colonoscopy in October 2015 revealing normal mucosa but sigmoid colon biopsy revealed lymphocytic colitis. She was treated with budesonide for 8 weeks and responded to therapy. In 2016 she was treated with 8 weeks of Uceris did not respond. On her last visit 1 year ago she was given prescription for cholestyramine which she did not fill because her co-pay for every month was $180. She did not call office to let us know. She did try IBgard but it did not help. She had bout of diarrhea about 2 months ago. Since then she's been taking Imodium OTC 2 mg daily. Now she is having formed stools but every now and then she has loose stool. She denies rectal bleeding abdominal pain or incontinence. She is taking dicyclomine once or twice daily and occasionally she takes it 3 times a day. She is not having any side effects with this medication. Her appetite is good and her weight has remained stable.   Current Medications: Outpatient Encounter Prescriptions as of 04/08/2016  Medication Sig  . atorvastatin (LIPITOR) 10 MG tablet Take 10 mg by mouth at bedtime.  . clonazePAM (KLONOPIN) 0.5 MG tablet Take 0.5 mg by mouth 2 (two) times daily as needed for anxiety.  . dicyclomine (BENTYL) 10 MG capsule Take 1 capsule (10 mg total) by mouth 3 (three) times daily before meals. Patient states that she started this 3 weeks ago  . FLUoxetine (PROZAC) 20 MG capsule Take 20 mg by mouth daily. Patient states that she takes as needed.  . hydrochlorothiazide (HYDRODIURIL) 25 MG tablet Take 25 mg by mouth daily.   Marland Kitchen loperamide (IMODIUM) 2 MG capsule Take 2 mg by mouth daily before breakfast.  . Menthol, Topical Analgesic, (ICY HOT) 5 % PADS Patient wears as needed for pain.  . calcium carbonate (OS-CAL) 600 MG TABS tablet Take 600 mg by mouth as needed.   .  cholecalciferol (VITAMIN D) 1000 UNITS tablet Take 2,000 Units by mouth daily.  . cholestyramine (QUESTRAN) 4 g packet Take 1 packet (4 g total) by mouth 2 (two) times daily.  . [DISCONTINUED] losartan-hydrochlorothiazide (HYZAAR) 50-12.5 MG per tablet Take 1 tablet by mouth daily.   No facility-administered encounter medications on file as of 04/08/2016.      Objective: Blood pressure 112/80, pulse 66, temperature 98.3 F (36.8 C), temperature source Oral, resp. rate 18, height 5\' 5"  (1.651 m), weight 209 lb 12.8 oz (95.2 kg). Patient is alert and in no acute distress. Conjunctiva is pink. Sclera is nonicteric Oropharyngeal mucosa is normal. No neck masses or thyromegaly noted. Cardiac exam with regular rhythm normal S1 and S2. No murmur or gallop noted. Lungs are clear to auscultation. Abdomen is full. Bowel sounds are normal. On palpation abdomen is soft and nontender without organomegaly or masses.  No LE edema or clubbing noted.    Assessment:  #1. Chronic diarrhea felt to be due to IBS. She also has history of lymphocytic colitis which was diagnosed over 2 years ago. She is having intermittent hard stools and therefore I doubt that lymphocytic colitis is relapse. If she does not have satisfactory response to therapy would consider retreating with budesonide.   Plan:  Continue loperamide OTC 2 mg every morning. Continue dicyclomine 10 mg by mouth 3 times a day when necessary. Patient will keep stool diary for the next 4 weeks and send [place]  the summary. Office visit in 3 months.

## 2016-04-16 ENCOUNTER — Encounter (INDEPENDENT_AMBULATORY_CARE_PROVIDER_SITE_OTHER): Payer: Self-pay | Admitting: Internal Medicine

## 2016-07-05 DIAGNOSIS — S79911A Unspecified injury of right hip, initial encounter: Secondary | ICD-10-CM | POA: Diagnosis not present

## 2016-07-05 DIAGNOSIS — Z6835 Body mass index (BMI) 35.0-35.9, adult: Secondary | ICD-10-CM | POA: Diagnosis not present

## 2016-07-05 DIAGNOSIS — E7800 Pure hypercholesterolemia, unspecified: Secondary | ICD-10-CM | POA: Diagnosis not present

## 2016-07-05 DIAGNOSIS — M542 Cervicalgia: Secondary | ICD-10-CM | POA: Diagnosis not present

## 2016-07-05 DIAGNOSIS — W108XXA Fall (on) (from) other stairs and steps, initial encounter: Secondary | ICD-10-CM | POA: Diagnosis not present

## 2016-07-05 DIAGNOSIS — S52124A Nondisplaced fracture of head of right radius, initial encounter for closed fracture: Secondary | ICD-10-CM | POA: Diagnosis not present

## 2016-07-05 DIAGNOSIS — S40021A Contusion of right upper arm, initial encounter: Secondary | ICD-10-CM | POA: Diagnosis not present

## 2016-07-05 DIAGNOSIS — M25531 Pain in right wrist: Secondary | ICD-10-CM | POA: Diagnosis not present

## 2016-07-05 DIAGNOSIS — M25551 Pain in right hip: Secondary | ICD-10-CM | POA: Diagnosis not present

## 2016-07-05 DIAGNOSIS — M25521 Pain in right elbow: Secondary | ICD-10-CM | POA: Diagnosis not present

## 2016-07-05 DIAGNOSIS — S60211A Contusion of right wrist, initial encounter: Secondary | ICD-10-CM | POA: Diagnosis not present

## 2016-07-05 DIAGNOSIS — S59901A Unspecified injury of right elbow, initial encounter: Secondary | ICD-10-CM | POA: Diagnosis not present

## 2016-07-05 DIAGNOSIS — S7001XA Contusion of right hip, initial encounter: Secondary | ICD-10-CM | POA: Diagnosis not present

## 2016-07-05 DIAGNOSIS — S199XXA Unspecified injury of neck, initial encounter: Secondary | ICD-10-CM | POA: Diagnosis not present

## 2016-07-05 DIAGNOSIS — M25511 Pain in right shoulder: Secondary | ICD-10-CM | POA: Diagnosis not present

## 2016-07-05 DIAGNOSIS — Z79899 Other long term (current) drug therapy: Secondary | ICD-10-CM | POA: Diagnosis not present

## 2016-07-08 DIAGNOSIS — Z9181 History of falling: Secondary | ICD-10-CM | POA: Diagnosis not present

## 2016-07-08 DIAGNOSIS — S4991XA Unspecified injury of right shoulder and upper arm, initial encounter: Secondary | ICD-10-CM | POA: Diagnosis not present

## 2016-07-10 DIAGNOSIS — I10 Essential (primary) hypertension: Secondary | ICD-10-CM | POA: Diagnosis not present

## 2016-07-10 DIAGNOSIS — E7800 Pure hypercholesterolemia, unspecified: Secondary | ICD-10-CM | POA: Diagnosis not present

## 2016-07-10 DIAGNOSIS — R739 Hyperglycemia, unspecified: Secondary | ICD-10-CM | POA: Diagnosis not present

## 2016-07-14 DIAGNOSIS — Z0001 Encounter for general adult medical examination with abnormal findings: Secondary | ICD-10-CM | POA: Diagnosis not present

## 2016-07-14 DIAGNOSIS — K580 Irritable bowel syndrome with diarrhea: Secondary | ICD-10-CM | POA: Diagnosis not present

## 2016-07-14 DIAGNOSIS — M818 Other osteoporosis without current pathological fracture: Secondary | ICD-10-CM | POA: Diagnosis not present

## 2016-07-14 DIAGNOSIS — G629 Polyneuropathy, unspecified: Secondary | ICD-10-CM | POA: Diagnosis not present

## 2016-07-14 DIAGNOSIS — E7800 Pure hypercholesterolemia, unspecified: Secondary | ICD-10-CM | POA: Diagnosis not present

## 2016-07-14 DIAGNOSIS — S63591A Other specified sprain of right wrist, initial encounter: Secondary | ICD-10-CM | POA: Diagnosis not present

## 2016-07-14 DIAGNOSIS — Z6834 Body mass index (BMI) 34.0-34.9, adult: Secondary | ICD-10-CM | POA: Diagnosis not present

## 2016-07-14 DIAGNOSIS — I10 Essential (primary) hypertension: Secondary | ICD-10-CM | POA: Diagnosis not present

## 2016-07-22 DIAGNOSIS — S52125A Nondisplaced fracture of head of left radius, initial encounter for closed fracture: Secondary | ICD-10-CM | POA: Diagnosis not present

## 2016-07-22 DIAGNOSIS — M25532 Pain in left wrist: Secondary | ICD-10-CM | POA: Diagnosis not present

## 2016-08-05 ENCOUNTER — Ambulatory Visit (INDEPENDENT_AMBULATORY_CARE_PROVIDER_SITE_OTHER): Payer: Medicare Other | Admitting: Internal Medicine

## 2016-08-05 ENCOUNTER — Encounter (INDEPENDENT_AMBULATORY_CARE_PROVIDER_SITE_OTHER): Payer: Self-pay | Admitting: Internal Medicine

## 2016-08-05 VITALS — BP 130/80 | HR 66 | Temp 98.2°F | Resp 18 | Ht 65.0 in | Wt 208.7 lb

## 2016-08-05 DIAGNOSIS — K52832 Lymphocytic colitis: Secondary | ICD-10-CM

## 2016-08-05 DIAGNOSIS — K580 Irritable bowel syndrome with diarrhea: Secondary | ICD-10-CM | POA: Diagnosis not present

## 2016-08-05 NOTE — Patient Instructions (Signed)
Notify if medication stops working

## 2016-08-05 NOTE — Progress Notes (Signed)
Presenting complaint;  Follow-up for chronic diarrhea.  Database and Subjective:  Patient is 71 year old Caucasian female who has history of chronic diarrhea. She underwent colonoscopy in October 2015 and sigmoid colon biopsy revealed changes of lymphocytic colitis. Initially she was treated with budesonide for 8 weeks and responded. When symptoms relapse in 2016 she was treated with Uceris but it did not help. She is been treated with Imodium and dicyclomine and was doing well on her last visit 4 months ago.  She is here for scheduled visit. She states she has done well except she had diarrhea with accidents last month. She did notice some mucus but she did not see any blood. She says change occurred after she took Aleve. She has not taken it anymore. Now she is back to her baseline. She generally has to soft/serum of formed stools daily. She is not having nocturnal bowel movements. She also denies abdominal pain. She is not having any side effects with dicyclomine which she takes twice a day. Her appetite is good and her weight has been stable.   Current Medications: Outpatient Encounter Prescriptions as of 08/05/2016  Medication Sig  . atorvastatin (LIPITOR) 10 MG tablet Take 10 mg by mouth at bedtime.  . dicyclomine (BENTYL) 10 MG capsule Take 1 capsule (10 mg total) by mouth 3 (three) times daily as needed for spasms. Patient states that she started this 3 weeks ago  . hydrochlorothiazide (HYDRODIURIL) 25 MG tablet Take 25 mg by mouth daily.   Marland Kitchen loperamide (IMODIUM) 2 MG capsule Take 2 mg by mouth daily before breakfast.  . Menthol, Topical Analgesic, (ICY HOT) 5 % PADS Patient wears as needed for pain.  . [DISCONTINUED] calcium carbonate (OS-CAL) 600 MG TABS tablet Take 600 mg by mouth as needed.   . [DISCONTINUED] cholecalciferol (VITAMIN D) 1000 UNITS tablet Take 2,000 Units by mouth daily.  . [DISCONTINUED] clonazePAM (KLONOPIN) 0.5 MG tablet Take 0.5 mg by mouth 2 (two) times daily  as needed for anxiety.  . [DISCONTINUED] FLUoxetine (PROZAC) 20 MG capsule Take 20 mg by mouth daily. Patient states that she takes as needed.   No facility-administered encounter medications on file as of 08/05/2016.      Objective: Blood pressure 130/80, pulse 66, temperature 98.2 F (36.8 C), temperature source Oral, resp. rate 18, height 5\' 5"  (1.651 m), weight 208 lb 11.2 oz (94.7 kg). Patient is alert and in no acute distress. Conjunctiva is pink. Sclera is nonicteric Oropharyngeal mucosa is normal. No neck masses or thyromegaly noted. Cardiac exam with regular rhythm normal S1 and S2. No murmur or gallop noted. Lungs are clear to auscultation. Abdomen is full. Bowel sounds are normal. On palpation abdomen is soft with mild tenderness at LLQ on deep palpation. No organomegaly or masses. No LE edema or clubbing noted.    Assessment:  #1. Chronic diarrhea. She is responding therapy for IBS. She is not having any side effects with dicyclomine. #2. History of lymphocytic colitis. She has been treated with topical budesonide in the past. This disease could've been triggered by and said as she did have an episode after she took Aleve. No further workup at this time. Will consider sigmoid colon biopsy at the time of next colonoscopy in October 2020.   Plan:  Patient will continue loperamide and dicyclomine at current dose. She will call if diarrhea relapses. Office visit in one year.

## 2016-10-02 DIAGNOSIS — Z96653 Presence of artificial knee joint, bilateral: Secondary | ICD-10-CM | POA: Diagnosis not present

## 2016-10-02 DIAGNOSIS — M170 Bilateral primary osteoarthritis of knee: Secondary | ICD-10-CM | POA: Diagnosis not present

## 2016-10-02 DIAGNOSIS — Z96651 Presence of right artificial knee joint: Secondary | ICD-10-CM | POA: Diagnosis not present

## 2016-10-02 DIAGNOSIS — Z471 Aftercare following joint replacement surgery: Secondary | ICD-10-CM | POA: Diagnosis not present

## 2016-10-02 DIAGNOSIS — Z96652 Presence of left artificial knee joint: Secondary | ICD-10-CM | POA: Diagnosis not present

## 2016-10-10 DIAGNOSIS — S52125D Nondisplaced fracture of head of left radius, subsequent encounter for closed fracture with routine healing: Secondary | ICD-10-CM | POA: Diagnosis not present

## 2016-10-16 DIAGNOSIS — Z6835 Body mass index (BMI) 35.0-35.9, adult: Secondary | ICD-10-CM | POA: Diagnosis not present

## 2016-10-16 DIAGNOSIS — N3000 Acute cystitis without hematuria: Secondary | ICD-10-CM | POA: Diagnosis not present

## 2016-10-16 DIAGNOSIS — M545 Low back pain: Secondary | ICD-10-CM | POA: Diagnosis not present

## 2016-11-25 DIAGNOSIS — Z6835 Body mass index (BMI) 35.0-35.9, adult: Secondary | ICD-10-CM | POA: Diagnosis not present

## 2016-11-25 DIAGNOSIS — M160 Bilateral primary osteoarthritis of hip: Secondary | ICD-10-CM | POA: Diagnosis not present

## 2016-11-25 DIAGNOSIS — M545 Low back pain: Secondary | ICD-10-CM | POA: Diagnosis not present

## 2017-01-27 DIAGNOSIS — K580 Irritable bowel syndrome with diarrhea: Secondary | ICD-10-CM | POA: Diagnosis not present

## 2017-01-27 DIAGNOSIS — I10 Essential (primary) hypertension: Secondary | ICD-10-CM | POA: Diagnosis not present

## 2017-01-27 DIAGNOSIS — R739 Hyperglycemia, unspecified: Secondary | ICD-10-CM | POA: Diagnosis not present

## 2017-01-27 DIAGNOSIS — E7800 Pure hypercholesterolemia, unspecified: Secondary | ICD-10-CM | POA: Diagnosis not present

## 2017-01-27 DIAGNOSIS — F331 Major depressive disorder, recurrent, moderate: Secondary | ICD-10-CM | POA: Diagnosis not present

## 2017-01-27 DIAGNOSIS — D519 Vitamin B12 deficiency anemia, unspecified: Secondary | ICD-10-CM | POA: Diagnosis not present

## 2017-01-30 DIAGNOSIS — I10 Essential (primary) hypertension: Secondary | ICD-10-CM | POA: Diagnosis not present

## 2017-01-30 DIAGNOSIS — G629 Polyneuropathy, unspecified: Secondary | ICD-10-CM | POA: Diagnosis not present

## 2017-01-30 DIAGNOSIS — F331 Major depressive disorder, recurrent, moderate: Secondary | ICD-10-CM | POA: Diagnosis not present

## 2017-01-30 DIAGNOSIS — F411 Generalized anxiety disorder: Secondary | ICD-10-CM | POA: Diagnosis not present

## 2017-01-30 DIAGNOSIS — K580 Irritable bowel syndrome with diarrhea: Secondary | ICD-10-CM | POA: Diagnosis not present

## 2017-01-30 DIAGNOSIS — R5383 Other fatigue: Secondary | ICD-10-CM | POA: Diagnosis not present

## 2017-01-30 DIAGNOSIS — E7800 Pure hypercholesterolemia, unspecified: Secondary | ICD-10-CM | POA: Diagnosis not present

## 2017-01-30 DIAGNOSIS — M818 Other osteoporosis without current pathological fracture: Secondary | ICD-10-CM | POA: Diagnosis not present

## 2017-05-05 DIAGNOSIS — H524 Presbyopia: Secondary | ICD-10-CM | POA: Diagnosis not present

## 2017-05-05 DIAGNOSIS — Z961 Presence of intraocular lens: Secondary | ICD-10-CM | POA: Diagnosis not present

## 2017-05-05 DIAGNOSIS — H35373 Puckering of macula, bilateral: Secondary | ICD-10-CM | POA: Diagnosis not present

## 2017-05-12 DIAGNOSIS — Z6835 Body mass index (BMI) 35.0-35.9, adult: Secondary | ICD-10-CM | POA: Diagnosis not present

## 2017-05-12 DIAGNOSIS — M7552 Bursitis of left shoulder: Secondary | ICD-10-CM | POA: Diagnosis not present

## 2017-06-05 DIAGNOSIS — M5013 Cervical disc disorder with radiculopathy, cervicothoracic region: Secondary | ICD-10-CM | POA: Diagnosis not present

## 2017-06-05 DIAGNOSIS — Z6835 Body mass index (BMI) 35.0-35.9, adult: Secondary | ICD-10-CM | POA: Diagnosis not present

## 2017-07-23 DIAGNOSIS — E7800 Pure hypercholesterolemia, unspecified: Secondary | ICD-10-CM | POA: Diagnosis not present

## 2017-07-23 DIAGNOSIS — R5383 Other fatigue: Secondary | ICD-10-CM | POA: Diagnosis not present

## 2017-07-23 DIAGNOSIS — D519 Vitamin B12 deficiency anemia, unspecified: Secondary | ICD-10-CM | POA: Diagnosis not present

## 2017-07-23 DIAGNOSIS — I10 Essential (primary) hypertension: Secondary | ICD-10-CM | POA: Diagnosis not present

## 2017-07-23 DIAGNOSIS — R739 Hyperglycemia, unspecified: Secondary | ICD-10-CM | POA: Diagnosis not present

## 2017-07-27 DIAGNOSIS — R5383 Other fatigue: Secondary | ICD-10-CM | POA: Diagnosis not present

## 2017-07-27 DIAGNOSIS — Z853 Personal history of malignant neoplasm of breast: Secondary | ICD-10-CM | POA: Diagnosis not present

## 2017-07-27 DIAGNOSIS — M818 Other osteoporosis without current pathological fracture: Secondary | ICD-10-CM | POA: Diagnosis not present

## 2017-07-27 DIAGNOSIS — G629 Polyneuropathy, unspecified: Secondary | ICD-10-CM | POA: Diagnosis not present

## 2017-07-27 DIAGNOSIS — K580 Irritable bowel syndrome with diarrhea: Secondary | ICD-10-CM | POA: Diagnosis not present

## 2017-07-27 DIAGNOSIS — Z6834 Body mass index (BMI) 34.0-34.9, adult: Secondary | ICD-10-CM | POA: Diagnosis not present

## 2017-07-27 DIAGNOSIS — I10 Essential (primary) hypertension: Secondary | ICD-10-CM | POA: Diagnosis not present

## 2017-07-27 DIAGNOSIS — E7800 Pure hypercholesterolemia, unspecified: Secondary | ICD-10-CM | POA: Diagnosis not present

## 2017-07-27 DIAGNOSIS — Z0001 Encounter for general adult medical examination with abnormal findings: Secondary | ICD-10-CM | POA: Diagnosis not present

## 2017-07-29 ENCOUNTER — Other Ambulatory Visit: Payer: Self-pay | Admitting: Family Medicine

## 2017-07-29 DIAGNOSIS — R921 Mammographic calcification found on diagnostic imaging of breast: Secondary | ICD-10-CM

## 2017-07-29 DIAGNOSIS — R920 Mammographic microcalcification found on diagnostic imaging of breast: Secondary | ICD-10-CM | POA: Diagnosis not present

## 2017-07-29 DIAGNOSIS — Z9889 Other specified postprocedural states: Secondary | ICD-10-CM | POA: Diagnosis not present

## 2017-07-29 DIAGNOSIS — Z853 Personal history of malignant neoplasm of breast: Secondary | ICD-10-CM | POA: Diagnosis not present

## 2017-08-03 ENCOUNTER — Other Ambulatory Visit: Payer: Self-pay | Admitting: Family Medicine

## 2017-08-04 ENCOUNTER — Ambulatory Visit (INDEPENDENT_AMBULATORY_CARE_PROVIDER_SITE_OTHER): Payer: Medicare Other | Admitting: Internal Medicine

## 2017-08-04 ENCOUNTER — Encounter (INDEPENDENT_AMBULATORY_CARE_PROVIDER_SITE_OTHER): Payer: Self-pay | Admitting: Internal Medicine

## 2017-08-04 VITALS — BP 128/76 | HR 72 | Temp 98.3°F | Resp 18 | Ht 65.0 in | Wt 211.8 lb

## 2017-08-04 DIAGNOSIS — K52832 Lymphocytic colitis: Secondary | ICD-10-CM | POA: Diagnosis not present

## 2017-08-04 DIAGNOSIS — K580 Irritable bowel syndrome with diarrhea: Secondary | ICD-10-CM

## 2017-08-04 NOTE — Progress Notes (Signed)
Presenting complaint;  Follow-up for diarrhea/history of lymphocytic colitis.  Subjective:  Patient is 72 year old Caucasian female with history of lymphocytic colitis who was here for scheduled visit.  She was last seen 1 year ago.  She states she is doing well with therapy.  She is on dicyclomine 10 mg twice daily and loperamide 2 mg every morning.  She states she has formed stools on most days.  Every now and then she skips for 1 or 2 days but she does not feel constipated.  She says she has had 5 accidents this year.  All of these occurred in the evening late afternoon.  One occurred in January 2 1 in April 3 1 in May in the last 2 episodes occurred in June.  She states only one episode associated with passage of large amount of stool in others it was scant amount.  All of these episodes occurred at home. She has good appetite and her weight has been stable. She is not having any side effects with dicyclomine.   Current Medications: Outpatient Encounter Medications as of 08/04/2017  Medication Sig  . atorvastatin (LIPITOR) 10 MG tablet Take 10 mg by mouth at bedtime.  . Calcium Carb-Cholecalciferol (CALCIUM 600+D) 600-800 MG-UNIT TABS Take by mouth 2 (two) times daily.  . cyclobenzaprine (FLEXERIL) 10 MG tablet Take 10 mg by mouth as needed for muscle spasms.  Marland Kitchen dicyclomine (BENTYL) 10 MG capsule Take 1 capsule (10 mg total) by mouth 3 (three) times daily as needed for spasms. Patient states that she started this 3 weeks ago  . escitalopram (LEXAPRO) 5 MG tablet Take 5 mg by mouth daily.  . hydrochlorothiazide (HYDRODIURIL) 12.5 MG tablet Take 12.5 mg by mouth daily.   Marland Kitchen loperamide (IMODIUM) 2 MG capsule Take 2 mg by mouth daily before breakfast.  . meloxicam (MOBIC) 7.5 MG tablet Take 7.5 mg by mouth as needed for pain.  . Menthol, Topical Analgesic, (ICY HOT) 5 % PADS as needed. Patient wears as needed for pain.   No facility-administered encounter medications on file as of 08/04/2017.       Objective: Blood pressure 128/76, pulse 72, temperature 98.3 F (36.8 C), temperature source Oral, resp. rate 18, height 5\' 5"  (1.651 m), weight 211 lb 12.8 oz (96.1 kg). Patient is alert and in no acute distress Conjunctiva is pink. Sclera is nonicteric Oropharyngeal mucosa is normal. No neck masses or thyromegaly noted. Cardiac exam with regular rhythm normal S1 and S2. No murmur or gallop noted. Lungs are clear to auscultation. Abdomen is full.  Bowel sounds are normal.  On palpation abdomen and soft and nontender with organomegaly or masses. No LE edema or clubbing noted.    Assessment:  #1.  History of lymphocytic colitis.  I's believe she is in remission. Most of her symptoms appear to be due to IBS.  She is due for surveillance colonoscopy next year at which time will obtain biopsies to document remission.  #2.  Irritable bowel syndrome.  She is having sporadic urgency and diarrhea.  The symptoms may be occurring because she is forgetting to take second dose of dicyclomine.   Plan:  Continue dicyclomine 10 mg before breakfast and evening meal or before lunch. Continue loperamide OTC 2 mg every morning. Patient will call if diarrhea and urgency worsens in which case we will try cholestyramine and/or Pepto-Bismol. Office visit in 1 year.

## 2017-08-04 NOTE — Patient Instructions (Addendum)
Notify if symptoms change or medication stops working. Next colonoscopy would be in October 2020.

## 2017-08-05 ENCOUNTER — Ambulatory Visit
Admission: RE | Admit: 2017-08-05 | Discharge: 2017-08-05 | Disposition: A | Discharge status: Home / Self Care | Payer: Medicare Other | Source: Ambulatory Visit | Attending: Family Medicine | Admitting: Family Medicine

## 2017-08-05 DIAGNOSIS — R921 Mammographic calcification found on diagnostic imaging of breast: Secondary | ICD-10-CM

## 2017-08-05 DIAGNOSIS — C50412 Malignant neoplasm of upper-outer quadrant of left female breast: Secondary | ICD-10-CM | POA: Diagnosis not present

## 2017-08-07 ENCOUNTER — Telehealth: Payer: Self-pay | Admitting: Hematology and Oncology

## 2017-08-07 ENCOUNTER — Encounter: Payer: Self-pay | Admitting: *Deleted

## 2017-08-07 NOTE — Telephone Encounter (Signed)
Spoke with patient to confirm morning [facility] appointment for 7/31, packet emailed to patient

## 2017-08-11 ENCOUNTER — Other Ambulatory Visit: Payer: Self-pay | Admitting: *Deleted

## 2017-08-11 DIAGNOSIS — C50412 Malignant neoplasm of upper-outer quadrant of left female breast: Secondary | ICD-10-CM | POA: Insufficient documentation

## 2017-08-11 DIAGNOSIS — Z170 Estrogen receptor positive status [ER+]: Principal | ICD-10-CM | POA: Insufficient documentation

## 2017-08-12 ENCOUNTER — Encounter: Payer: Self-pay | Admitting: Physical Therapy

## 2017-08-12 ENCOUNTER — Ambulatory Visit: Payer: Medicare Other | Attending: General Surgery | Admitting: Physical Therapy

## 2017-08-12 ENCOUNTER — Inpatient Hospital Stay: Payer: Medicare Other | Attending: Hematology and Oncology | Admitting: Hematology and Oncology

## 2017-08-12 ENCOUNTER — Encounter: Payer: Self-pay | Admitting: *Deleted

## 2017-08-12 ENCOUNTER — Other Ambulatory Visit: Payer: Self-pay | Admitting: *Deleted

## 2017-08-12 ENCOUNTER — Other Ambulatory Visit: Payer: Self-pay

## 2017-08-12 ENCOUNTER — Ambulatory Visit
Admission: RE | Admit: 2017-08-12 | Discharge: 2017-08-12 | Disposition: A | Discharge status: Home / Self Care | Payer: Medicare Other | Source: Ambulatory Visit | Attending: Radiation Oncology | Admitting: Radiation Oncology

## 2017-08-12 ENCOUNTER — Ambulatory Visit: Payer: Self-pay | Admitting: General Surgery

## 2017-08-12 ENCOUNTER — Encounter: Payer: Self-pay | Admitting: Hematology and Oncology

## 2017-08-12 ENCOUNTER — Inpatient Hospital Stay: Payer: Medicare Other

## 2017-08-12 DIAGNOSIS — Z170 Estrogen receptor positive status [ER+]: Principal | ICD-10-CM

## 2017-08-12 DIAGNOSIS — C50912 Malignant neoplasm of unspecified site of left female breast: Secondary | ICD-10-CM

## 2017-08-12 DIAGNOSIS — C50412 Malignant neoplasm of upper-outer quadrant of left female breast: Secondary | ICD-10-CM

## 2017-08-12 DIAGNOSIS — M25612 Stiffness of left shoulder, not elsewhere classified: Secondary | ICD-10-CM | POA: Diagnosis not present

## 2017-08-12 DIAGNOSIS — I10 Essential (primary) hypertension: Secondary | ICD-10-CM | POA: Diagnosis not present

## 2017-08-12 DIAGNOSIS — Z86000 Personal history of in-situ neoplasm of breast: Secondary | ICD-10-CM | POA: Diagnosis not present

## 2017-08-12 DIAGNOSIS — R293 Abnormal posture: Secondary | ICD-10-CM | POA: Insufficient documentation

## 2017-08-12 DIAGNOSIS — M79602 Pain in left arm: Secondary | ICD-10-CM | POA: Insufficient documentation

## 2017-08-12 DIAGNOSIS — Z803 Family history of malignant neoplasm of breast: Secondary | ICD-10-CM | POA: Diagnosis not present

## 2017-08-12 LAB — CBC WITH DIFFERENTIAL (CANCER CENTER ONLY)
BASOS PCT: 0 %
Basophils Absolute: 0 10*3/uL (ref 0.0–0.1)
Eosinophils Absolute: 0.1 10*3/uL (ref 0.0–0.5)
Eosinophils Relative: 3 %
HEMATOCRIT: 41.1 % (ref 34.8–46.6)
HEMOGLOBIN: 13.6 g/dL (ref 11.6–15.9)
LYMPHS ABS: 1 10*3/uL (ref 0.9–3.3)
LYMPHS PCT: 20 %
MCH: 31 pg (ref 25.1–34.0)
MCHC: 33.1 g/dL (ref 31.5–36.0)
MCV: 93.6 fL (ref 79.5–101.0)
MONO ABS: 0.5 10*3/uL (ref 0.1–0.9)
MONOS PCT: 10 %
NEUTROS ABS: 3.6 10*3/uL (ref 1.5–6.5)
NEUTROS PCT: 67 %
Platelet Count: 170 10*3/uL (ref 145–400)
RBC: 4.39 MIL/uL (ref 3.70–5.45)
RDW: 13.4 % (ref 11.2–14.5)
WBC Count: 5.3 10*3/uL (ref 3.9–10.3)

## 2017-08-12 LAB — CMP (CANCER CENTER ONLY)
ALBUMIN: 3.8 g/dL (ref 3.5–5.0)
ALK PHOS: 81 U/L (ref 38–126)
ALT: 19 U/L (ref 0–44)
ANION GAP: 9 (ref 5–15)
AST: 14 U/L — ABNORMAL LOW (ref 15–41)
BUN: 21 mg/dL (ref 8–23)
CO2: 25 mmol/L (ref 22–32)
Calcium: 9.4 mg/dL (ref 8.9–10.3)
Chloride: 108 mmol/L (ref 98–111)
Creatinine: 0.85 mg/dL (ref 0.44–1.00)
GFR, Estimated: >60 mL/min (ref >60)
GLUCOSE: 115 mg/dL — AB (ref 70–99)
POTASSIUM: 3.3 mmol/L — AB (ref 3.5–5.1)
SODIUM: 142 mmol/L (ref 135–145)
Total Bilirubin: 0.6 mg/dL (ref 0.3–1.2)
Total Protein: 6.7 g/dL (ref 6.5–8.1)

## 2017-08-12 NOTE — Progress Notes (Signed)
[place] NOTE  Patient Care Team: Sasser, Silvestre Moment, MD as PCP - General (Unknown Physician Specialty) Excell Seltzer, MD as Consulting Physician (General Surgery) Nicholas Lose, MD as Consulting Physician (Hematology and Oncology) Gery Pray, MD as Consulting Physician (Radiation Oncology)  CHIEF COMPLAINTS/PURPOSE OF CONSULTATION:  Newly diagnosed breast cancer  HISTORY OF PRESENTING ILLNESS:  Sylvia Barnett 72 y.o. female is here because of recent diagnosis of left breast cancer.  Patient had previous abnormalities in the left breast in 2000 had left lumpectomy for LCIS in 2010 recurrent left breast LCIS.  She had a routine screening mammogram that detected calcifications in the left breast measuring 1.1 cm.  She underwent biopsy that revealed invasive lobular cancer that was ER positive PR negative HER-2 positive and a Ki-67 of 1%.  There were some posterior calcifications for which stereotactic biopsy was attempted but it was felt to be discordant.  Because this recommendation was to perform an excision biopsy during her surgery to this area.  I reviewed her records extensively and collaborated the history with the patient.  SUMMARY OF ONCOLOGIC HISTORY:   Malignant neoplasm of upper-outer quadrant of left breast in female, estrogen receptor positive ([place])   08/05/2017 Initial Diagnosis    Screening detected left breast calcifications UOQ 1.1 cm biopsy revealed invasive lobular cancer with LCIS and CSL, grade 2, ER 90%, PR 0%, Ki-67 1%, HER-2 positive ratio 2.62, T2N0 stage Ia clinical stage AJCC 8      08/12/2017 Cancer Staging    Staging form: Breast, AJCC 8th Edition - Clinical stage from 08/12/2017: Stage IA (cT1c, cN0, cM0, G2, ER+, PR-, HER2+) - Signed by Nicholas Lose, MD on 08/12/2017       MEDICAL HISTORY:  Past Medical History:  Diagnosis Date  . Anxiety   . Arthritis    Right hip  . Cancer [facility]) 2004    Breast Lumpectomy  .  Complication of anesthesia    Has a small throat  . Depression   . Hypercholesteremia   . Hypertension   . IBS (irritable bowel syndrome)   . Incontinence     SURGICAL HISTORY: Past Surgical History:  Procedure Laterality Date  . ABDOMINAL HYSTERECTOMY  2002  . APPENDECTOMY  1949  . BIOPSY BREAST  2010   Left  . Bladder Tack  2002  . BREAST LUMPECTOMY  2004   Left  . BREAST LUMPECTOMY  2004   Left  . BREAST LUMPECTOMY  2006   Left  . BREAST LUMPECTOMY  2000   Left  . BUNIONECTOMY  1994   Right  . CATARACT EXTRACTION W/PHACO Left 08/18/2013   Procedure: CATARACT EXTRACTION PHACO AND INTRAOCULAR LENS PLACEMENT (IOC);  Surgeon: Tonny Branch, MD;  Location: [facility];  Service: Ophthalmology;  Laterality: Left;  CDE: 8.62  . COLONOSCOPY N/A 11/10/2013   Procedure: COLONOSCOPY;  Surgeon: Rogene Houston, MD;  Location: [facility];  Service: Endoscopy;  Laterality: N/A;  240  . EYE SURGERY  2011   Catarct Right  . EYE SURGERY  08-2011   Right Yag procedure  . JOINT REPLACEMENT  08-2010   Left total knee  . KNEE ARTHROSCOPY  2010   Right  . KNEE ARTHROSCOPY  2003   Left  . TOTAL KNEE ARTHROPLASTY  11/03/2011   Procedure: TOTAL KNEE ARTHROPLASTY;  Surgeon: Gearlean Alf, MD;  Location: [facility];  Service: Orthopedics;  Laterality: Right;  . TUBAL LIGATION    . WRIST SURGERY Bilateral  03-2010  . WRIST SURGERY  2008   Right    SOCIAL HISTORY: Social History   Socioeconomic History  . Marital status: Married    Spouse name: Not on file  . Number of children: Not on file  . Years of education: Not on file  . Highest education level: Not on file  Occupational History  . Not on file  Social Needs  . Financial resource strain: Not on file  . Food insecurity:    Worry: Not on file    Inability: Not on file  . Transportation needs:    Medical: Not on file    Non-medical: Not on file  Tobacco Use  . Smoking status: Never Smoker  . Smokeless tobacco: Never Used   Substance and Sexual Activity  . Alcohol use: No    Alcohol/week: 0.0 oz  . Drug use: No  . Sexual activity: Yes    Birth control/protection: Surgical  Lifestyle  . Physical activity:    Days per week: Not on file    Minutes per session: Not on file  . Stress: Not on file  Relationships  . Social connections:    Talks on phone: Not on file    Gets together: Not on file    Attends religious service: Not on file    Active member of club or organization: Not on file    Attends meetings of clubs or organizations: Not on file    Relationship status: Not on file  . Intimate partner violence:    Fear of current or ex partner: Not on file    Emotionally abused: Not on file    Physically abused: Not on file    Forced sexual activity: Not on file  Other Topics Concern  . Not on file  Social History Narrative  . Not on file    FAMILY HISTORY: Family History  Problem Relation Age of Onset  . Breast cancer Sister 85  . Lymphoma Maternal Aunt     ALLERGIES:  is allergic to fiorinal-codeine #3 [butalbital-asa-caff-codeine]; lansoprazole; latex; butalbital-aspirin-caffeine; sulfa antibiotics; and sulfasalazine.  MEDICATIONS:  Current Outpatient Medications  Medication Sig Dispense Refill  . atorvastatin (LIPITOR) 10 MG tablet Take 10 mg by mouth at bedtime.    . Calcium Carb-Cholecalciferol (CALCIUM 600+D) 600-800 MG-UNIT TABS Take by mouth 2 (two) times daily.    . cyclobenzaprine (FLEXERIL) 10 MG tablet Take 10 mg by mouth as needed for muscle spasms.    Marland Kitchen dicyclomine (BENTYL) 10 MG capsule Take 1 capsule (10 mg total) by mouth 3 (three) times daily as needed for spasms. Patient states that she started this 3 weeks ago 270 capsule 1  . escitalopram (LEXAPRO) 5 MG tablet Take 5 mg by mouth daily.    . hydrochlorothiazide (HYDRODIURIL) 12.5 MG tablet Take 12.5 mg by mouth daily.     Marland Kitchen loperamide (IMODIUM) 2 MG capsule Take 2 mg by mouth daily before breakfast.    . meloxicam  (MOBIC) 7.5 MG tablet Take 7.5 mg by mouth as needed for pain.    . Menthol, Topical Analgesic, (ICY HOT) 5 % PADS as needed. Patient wears as needed for pain.     No current facility-administered medications for this visit.     REVIEW OF SYSTEMS:   Constitutional: Denies fevers, chills or abnormal night sweats Eyes: Denies blurriness of vision, double vision or watery eyes Ears, nose, mouth, throat, and face: Denies mucositis or sore throat Respiratory: Denies cough, dyspnea or wheezes Cardiovascular: Denies palpitation,  chest discomfort or lower extremity swelling Gastrointestinal:  Denies nausea, heartburn or change in bowel habits Skin: Denies abnormal skin rashes Lymphatics: Denies new lymphadenopathy or easy bruising Neurological:Denies numbness, tingling or new weaknesses Behavioral/Psych: Mood is stable, no new changes  Breast:  Denies any palpable lumps or discharge All other systems were reviewed with the patient and are negative.  PHYSICAL EXAMINATION: ECOG PERFORMANCE STATUS: 1 - Symptomatic but completely ambulatory  Vitals:   08/12/17 0847  BP: (!) 156/71  Pulse: 75  Resp: 17  Temp: 98.5 F (36.9 C)  SpO2: 96%   Filed Weights   08/12/17 0847  Weight: 212 lb 4.8 oz (96.3 kg)    GENERAL:alert, no distress and comfortable SKIN: skin color, texture, turgor are normal, no rashes or significant lesions EYES: normal, conjunctiva are pink and non-injected, sclera clear OROPHARYNX:no exudate, no erythema and lips, buccal mucosa, and tongue normal  NECK: supple, thyroid normal size, non-tender, without nodularity LYMPH:  no palpable lymphadenopathy in the cervical, axillary or inguinal LUNGS: clear to auscultation and percussion with normal breathing effort HEART: regular rate & rhythm and no murmurs and no lower extremity edema ABDOMEN:abdomen soft, non-tender and normal bowel sounds Musculoskeletal:no cyanosis of digits and no clubbing  PSYCH: alert & oriented x 3  with fluent speech NEURO: no focal motor/sensory deficits BREAST: No palpable nodules in breast. No palpable axillary or supraclavicular lymphadenopathy (exam performed in the presence of a chaperone)   LABORATORY DATA:  I have reviewed the data as listed Lab Results  Component Value Date   WBC 5.3 08/12/2017   HGB 13.6 08/12/2017   HCT 41.1 08/12/2017   MCV 93.6 08/12/2017   PLT 170 08/12/2017   Lab Results  Component Value Date   NA 142 08/12/2017   K 3.3 (L) 08/12/2017   CL 108 08/12/2017   CO2 25 08/12/2017    RADIOGRAPHIC STUDIES: I have personally reviewed the radiological reports and agreed with the findings in the report.  ASSESSMENT AND PLAN:  Malignant neoplasm of upper-outer quadrant of left breast in female, estrogen receptor positive ([place]) 08/05/2017:Screening detected left breast calcifications UOQ 1.1 cm biopsy revealed invasive lobular cancer with LCIS and CSL, grade 2, ER 90%, PR 0%, Ki-67 1%, HER-2 positive ratio 2.62, T2N0 stage Ia clinical stage AJCC 8  Pathology and radiology counseling: Discussed with the patient, the details of pathology including the type of breast cancer,the clinical staging, the significance of ER, PR and HER-2/neu receptors and the implications for treatment. After reviewing the pathology in detail, we proceeded to discuss the different treatment options between surgery, radiation, chemotherapy, antiestrogen therapies.  Treatment plan: 1.  Breast conserving surgery with sentinel lymph node study 2 excisional biopsy of the posterior.  Calcifications which were biopsy-proven to be benign previously 3.  Adjuvant therapy with Taxol Herceptin weekly x12 followed by Herceptin maintenance for 1 year 4.  Adjuvant radiation  5.  Followed by adjuvant antiestrogen therapy -------------------------------------------------------------------------------- Chemo counseling: I discussed extensively with risks and benefits of chemotherapy with Taxol and  Herceptin. We discussed the risk of neuropathy cytopenias nausea and hair loss from Taxol as well as reversible cardiotoxicity from Herceptin.  Patient is agreeable with the plan and will return to see me back after surgery to discuss the final pathology report and confirm the final treatment plan.     All questions were answered. The patient knows to call the clinic with any problems, questions or concerns.    Harriette Ohara, MD 08/12/17

## 2017-08-12 NOTE — Assessment & Plan Note (Signed)
08/05/2017:Screening detected left breast calcifications UOQ 1.1 cm biopsy revealed invasive lobular cancer with LCIS and CSL, grade 2, ER 90%, PR 0%, Ki-67 1%, HER-2 positive ratio 2.62, T2N0 stage Ia clinical stage AJCC 8  Pathology and radiology counseling: Discussed with the patient, the details of pathology including the type of breast cancer,the clinical staging, the significance of ER, PR and HER-2/neu receptors and the implications for treatment. After reviewing the pathology in detail, we proceeded to discuss the different treatment options between surgery, radiation, chemotherapy, antiestrogen therapies.  Treatment plan: 1.  Breast conserving surgery with sentinel lymph node study 2 excisional biopsy of the posterior.  Calcifications which were biopsy-proven to be benign previously 3.  Adjuvant therapy with Taxol Herceptin weekly x12 followed by Herceptin maintenance for 1 year 4.  Adjuvant radiation  5.  Followed by adjuvant antiestrogen therapy -------------------------------------------------------------------------------- Chemo counseling: I discussed extensively with risks and benefits of chemotherapy with Taxol and Herceptin. We discussed the risk of neuropathy cytopenias nausea and hair loss from Taxol as well as reversible cardiotoxicity from Herceptin.  Patient is agreeable with the plan and will return to see me back after surgery to discuss the final pathology report and confirm the final treatment plan.

## 2017-08-12 NOTE — Patient Instructions (Signed)

## 2017-08-12 NOTE — Therapy (Signed)
[place], [place], [address] Phone: [phone]   Fax:  [phone]  Physical Therapy Evaluation  Patient Details  Name: Sylvia Barnett MRN: [id number] Date of Birth: May 28, 1945 Referring Provider: Dr. Excell Seltzer   Encounter Date: 08/12/2017  PT End of Session - 08/12/17 1137    Visit Number  1    Number of Visits  8    Date for PT Re-Evaluation  09/09/17    PT Start Time  1027    PT Stop Time  1100    PT Time Calculation (min)  33 min    Activity Tolerance  Patient tolerated treatment well    Behavior During Therapy  [facility] for tasks assessed/performed       Past Medical History:  Diagnosis Date  . Anxiety   . Arthritis    Right hip  . Cancer [facility]) 2004    Breast Lumpectomy  . Complication of anesthesia    Has a small throat  . Depression   . Hypercholesteremia   . Hypertension   . IBS (irritable bowel syndrome)   . Incontinence     Past Surgical History:  Procedure Laterality Date  . ABDOMINAL HYSTERECTOMY  2002  . APPENDECTOMY  1949  . BIOPSY BREAST  2010   Left  . Bladder Tack  2002  . BREAST LUMPECTOMY  2004   Left  . BREAST LUMPECTOMY  2004   Left  . BREAST LUMPECTOMY  2006   Left  . BREAST LUMPECTOMY  2000   Left  . BUNIONECTOMY  1994   Right  . CATARACT EXTRACTION W/PHACO Left 08/18/2013   Procedure: CATARACT EXTRACTION PHACO AND INTRAOCULAR LENS PLACEMENT (IOC);  Surgeon: Tonny Branch, MD;  Location: [facility];  Service: Ophthalmology;  Laterality: Left;  CDE: 8.62  . COLONOSCOPY N/A 11/10/2013   Procedure: COLONOSCOPY;  Surgeon: Rogene Houston, MD;  [facility];  Service: Endoscopy;  Laterality: N/A;  240  . EYE SURGERY  2011   Catarct Right  . EYE SURGERY  08-2011   Right Yag procedure  . JOINT REPLACEMENT  08-2010   Left total knee  . KNEE ARTHROSCOPY  2010   Right  . KNEE ARTHROSCOPY  2003   Left  . TOTAL KNEE ARTHROPLASTY  11/03/2011   Procedure: TOTAL KNEE ARTHROPLASTY;  Surgeon: Gearlean Alf, MD;  Location: [facility];  Service: Orthopedics;  Laterality: Right;  . TUBAL LIGATION    . WRIST SURGERY Bilateral 03-2010  . WRIST SURGERY  2008   Right    There were no vitals filed for this visit.   Subjective Assessment - 08/12/17 1114    Subjective  Patient reports she is here today at the cancer center to be seen by her medical team for her newly diagnosed left breast cancer. She also has left upper arm and shoulder pain from a left elbow fracture in 07/2016. She reports she fell in her backyard while playing kickball with her granddaughter. She did not have therapy for that but had a cortisone injection in her left shoulder 2 months ago which she reports helped minimally.    Patient is accompained by:  Family member    Pertinent History  Patient was diagnosed on 07/29/17 with left grade II invasive lobular carcinoma breast cancer. It measures 1.1 cm and is located in the lower outer quadrant. It is ER positive, PR negative, and HER2 positive with a Ki67 of 1%. She has a previous  left breast LCIS in 2000 and had a lumpectomy for that. She has had both knee replaced: right 2012 and left 2013. She fractured her left elbow in 07/2016 when she fell playing kickball with her grandchild.    Patient Stated Goals  Decrease left arm pain and learn post op shoulder ROM HEP    Currently in Pain?  Yes    Pain Score  5     Pain Location  Elbow Left elbow into upper arm    Pain Orientation  Left    Pain Descriptors / Indicators  Aching;Sharp    Pain Type  Chronic pain    Pain Radiating Towards  left upper arm and shoulder    Pain Onset  More than a month ago    Pain Frequency  Intermittent    Aggravating Factors   Lifting, reaching, carrying    Pain Relieving Factors  Mobic    Multiple Pain Sites  No         [facility] PT Assessment - 08/12/17 0001      Assessment   Medical Diagnosis  Left breast cancer; left arm pain    Referring  Provider  Dr. Excell Seltzer    Onset Date/Surgical Date  07/29/17 07/29/17 for breast cancer; 07/2016 for left elbow fracture    Hand Dominance  Right    Prior Therapy  None      Precautions   Precautions  Other (comment)    Precaution Comments  active cancer; left arm lymphedema risk after breast surgery      Restrictions   Weight Bearing Restrictions  No      Balance Screen   Has the patient fallen in the past 6 months  No    Has the patient had a decrease in activity level because of a fear of falling?   No    Is the patient reluctant to leave their home because of a fear of falling?   No      Home Film/video editor residence    Living Arrangements  Spouse/significant other    Available Help at Discharge  Family      Prior Function   Level of [place]  Retired    Leisure  She does not exercise      Cognition   Overall Cognitive Status  Within Functional Limits for tasks assessed      Posture/Postural Control   Posture/Postural Control  Postural limitations    Postural Limitations  Rounded Shoulders;Forward head      ROM / Strength   AROM / PROM / Strength  AROM;Strength      AROM   AROM Assessment Site  Shoulder;Cervical    Right/Left Shoulder  Right;Left    Right Shoulder Extension  51 Degrees    Right Shoulder Flexion  134 Degrees    Right Shoulder ABduction  150 Degrees    Right Shoulder Internal Rotation  50 Degrees    Right Shoulder External Rotation  72 Degrees    Left Shoulder Extension  46 Degrees    Left Shoulder Flexion  90 Degrees Painful in left upper arm and shoulder    Left Shoulder ABduction  109 Degrees    Left Shoulder Internal Rotation  53 Degrees    Left Shoulder External Rotation  75 Degrees    Cervical Flexion  WNL    Cervical Extension  WNL    Cervical - Right Side Bend  WNL  Cervical - Left Side Bend  WNL    Cervical - Right Rotation  WNL    Cervical - Left Rotation  WNL       Strength   Overall Strength Comments  Left UE 5/5 except shoulder external rotation 4-/5 and painful      Palpation   Palpation comment  Tender to palpation       Special Tests    Special Tests  Biceps/Labral Tests;Rotator Cuff Impingement    Biceps/Labral tests  Speeds Test      Speeds test   findings  Positive    Side  Left        LYMPHEDEMA/ONCOLOGY QUESTIONNAIRE - 08/12/17 1136      Type   Cancer Type  Left breast cancer      Lymphedema Assessments   Lymphedema Assessments  Upper extremities      Right Upper Extremity Lymphedema   10 cm Proximal to Olecranon Process  33.7 cm    Olecranon Process  28.2 cm    10 cm Proximal to Ulnar Styloid Process  25 cm    Just Proximal to Ulnar Styloid Process  16.8 cm    Across Hand at [employer]  18.5 cm    At [place] of 2nd Digit  6.8 cm      Left Upper Extremity Lymphedema   10 cm Proximal to Olecranon Process  36 cm    Olecranon Process  27.2 cm    10 cm Proximal to Ulnar Styloid Process  24.9 cm    Just Proximal to Ulnar Styloid Process  17.5 cm    Across Hand at [employer]  18.6 cm    At [place] of 2nd Digit  6.3 cm             Objective measurements completed on examination: See above findings.        Patient was instructed today in a home exercise program today for post op shoulder range of motion. These included active assist shoulder flexion in sitting, scapular retraction, wall walking with shoulder abduction, and hands behind head external rotation.  She was encouraged to do these twice a day, holding 3 seconds and repeating 5 times when permitted by her physician.          PT Education - 08/12/17 1137    Education Details  Lymphedema risk reduction and post op shoulder ROM HEP    Person(s) Educated  Patient;Spouse    Methods  Explanation;Demonstration;Handout    Comprehension  Returned demonstration;Verbalized understanding          PT Long Term Goals - 08/12/17 1202      PT LONG  TERM GOAL #1   Title  Patient will be independent with her HEP for increasing shoulder ROM.    Time  4    Period  Weeks    Status  New      PT LONG TERM GOAL #2   Title  Patient will increase left shoulder active flexion to >/= 130 degrees for increased ease reaching overhead.    Time  4    Period  Weeks    Status  New      PT LONG TERM GOAL #3   Title  Patient will increase left shoulder active abduction to >/= 130 degrees for increased ease obtaining radiation positioning.    Time  4    Period  Weeks    Status  New      PT LONG TERM  GOAL #4   Title  Patient will report >/= 40% reduction in left arm pain to tolerate performing normal daily tasks.    Time  4    Period  Weeks    Status  New      Breast Clinic Goals - 08/12/17 1201      Patient will be able to verbalize understanding of pertinent lymphedema risk reduction practices relevant to her diagnosis specifically related to skin care.   Time  1    Period  Days    Status  Achieved      Patient will be able to return demonstrate and/or verbalize understanding of the post-op home exercise program related to regaining shoulder range of motion.   Time  1    Period  Days    Status  Achieved      Patient will be able to verbalize understanding of the importance of attending the postoperative After Breast Cancer Class for further lymphedema risk reduction education and therapeutic exercise.   Time  1    Period  Days    Status  Achieved            Plan - 08/12/17 1145    Clinical Impression Statement  Patient was diagnosed on 07/29/17 with left grade II invasive lobular carcinoma breast cancer. It measures 1.1 cm and is located in the lower outer quadrant. It is ER positive, PR negative, and HER2 positive with a Ki67 of 1%. She has a previous left breast LCIS in 2000 and had a lumpectomy for that. She has had both knee replaced: right 2012 and left 2013. She fractured her left elbow in 07/2016 when she fell playing  kickball with her grandchild. Her multidisciplinary medicla team met prior to her assessments to determine a recommended treatment plan. She is planning to have a left lumpectomy and sentinel node biopsy followed by chemotherapy, radiation, and anti-estrogen therapy. She will benefit from PT now to improve her left arm mobility and better prepare her to obtain radiation positioning.     History and Personal Factors relevant to plan of care:  Active left breast cancer; osteopenia; previous left LCIS lumpectomy    Clinical Presentation  Stable    Clinical Decision Making  Low    Rehab Potential  Excellent    Clinical Impairments Affecting Rehab Potential  Active cancer    PT Frequency  2x / week    PT Duration  4 weeks    PT Treatment/Interventions  ADLs/Self Care Home Management;Therapeutic activities;Therapeutic exercise;Manual techniques;Passive range of motion;Dry needling;Patient/family education;Iontophoresis 56m/ml Dexamethasone;Moist Heat Heat to left arm to only be done prior to surgery as she will be at risk for lymphedema after surgery    PT Next Visit Plan  Begin soft tissue and PROM left shoulder; issue HEP to improve shoulder ROM     PT Home Exercise Plan  POst op shoulder ROM HEP for after breast surgery    Consulted and Agree with Plan of Care  Patient;Family member/caregiver    Family Member Consulted  Husband       Patient will benefit from skilled therapeutic intervention in order to improve the following deficits and impairments:  Decreased range of motion, Pain, Impaired UE functional use, Decreased strength, Decreased knowledge of precautions  Visit Diagnosis: Malignant neoplasm of upper-outer quadrant of left breast in female, estrogen receptor positive ([place] - Plan: PT plan of care cert/re-cert  Abnormal posture - Plan: PT plan of care cert/re-cert  Pain in left arm -  Plan: PT plan of care cert/re-cert  Stiffness of left shoulder, not elsewhere classified - Plan: PT  plan of care cert/re-cert     Problem List Patient Active Problem List   Diagnosis Date Noted  . Malignant neoplasm of upper-outer quadrant of left breast in female, estrogen receptor positive ([place]) 08/11/2017  . Microscopic colitis 08/31/2014  . Family hx of colon cancer 10/27/2013  . Essential hypertension 10/27/2013  . High cholesterol 10/27/2013  . Breast cancer ([place]) 10/27/2013  . Postop Acute blood loss anemia 11/04/2011  . OA (osteoarthritis) of knee 11/03/2011    Annia Friendly, PT 08/12/17 12:06 PM  [place], [place], [address] Phone: [phone]   Fax:  [phone]  Name: Sylvia Barnett MRN: [id number] Date of Birth: 1945/08/21

## 2017-08-12 NOTE — Progress Notes (Signed)
Radiation Oncology         ([phone]) [phone] ________________________________  Multidisciplinary Breast Oncology Clinic [facility]) Initial Outpatient Consultation  Name: Sylvia Barnett MRN: [id number]  Date: 08/12/2017  DOB: Jun 02, 1945  BS:[id number], Silvestre Moment, MD  Excell Seltzer, MD   REFERRING PHYSICIAN: Excell Seltzer, MD  DIAGNOSIS: Stage IA, cT1c cNx cMx, Left Breast, UOQ, Invasive Lobular Carcinoma, ER (+), PR (neg), HER2 (+), grade 2  Cancer Staging Malignant neoplasm of upper-outer quadrant of left breast in female, estrogen receptor positive ([place]) Staging form: Breast, AJCC 8th Edition - Clinical stage from 08/12/2017: Stage IA (cT1c, cN0, cM0, G2, ER+, PR-, HER2+) - Unsigned    HISTORY OF PRESENT ILLNESS::Sylvia Barnett is a 72 y.o. female who is presenting to the office today for evaluation of her newly diagnosed breast cancer.   She underwent bilateral diagnostic mammography with tomography with results showing: Two groups of calcifications in the left breast are suspicious for possible malignancy.   Accordingly on 08/05/2017 she proceeded to biopsy of the left breast area in question. The pathology from this procedure showed: Breast, left, needle core biopsy, upper outer anterior with invasive mammary carcinoma. Mammary carcinoma in situ with calcifications. Complex sclerosing lesion. Breast, left, needle core biopsy, upper outer posterior with benign breast tissue. Prognostic indicators significant for: estrogen receptor, 90% positive with strong staining intensity and progesterone receptor, 0% negative. Proliferation marker Ki67 at 1%. HER2 positive.  On review of systems, She reports throbbing left shoulder pain, urinary incontinence, and hot flashes. She denies any other symptoms. She has a hx of left lumpectomy in 2004 where LCIS was found along with a core biopsy in 2010 that showed LCIS. She didn't have any radiation or lymph node removal at that time. She has a family  hx of breast cancer in her sister.   Menarche: 89 years old Age at first live birth: 72 years old GP: GxP2 LMP: at age 1 Contraceptive: Yes for 3 years HRT: No   The patient was referred today for presentation in the multidisciplinary conference.  Radiology studies and pathology slides were presented there for review and discussion of treatment options.  A consensus was discussed regarding potential next steps.  PREVIOUS RADIATION THERAPY: No  PAST MEDICAL HISTORY:  has a past medical history of Anxiety, Arthritis, Cancer ([place]) (1638), Complication of anesthesia, Depression, Hypercholesteremia, Hypertension, IBS (irritable bowel syndrome), and Incontinence.    PAST SURGICAL HISTORY: Past Surgical History:  Procedure Laterality Date  . ABDOMINAL HYSTERECTOMY  2002  . APPENDECTOMY  1949  . BIOPSY BREAST  2010   Left  . Bladder Tack  2002  . BREAST LUMPECTOMY  2004   Left  . BREAST LUMPECTOMY  2004   Left  . BREAST LUMPECTOMY  2006   Left  . BREAST LUMPECTOMY  2000   Left  . BUNIONECTOMY  1994   Right  . CATARACT EXTRACTION W/PHACO Left 08/18/2013   Procedure: CATARACT EXTRACTION PHACO AND INTRAOCULAR LENS PLACEMENT (IOC);  Surgeon: Tonny Branch, MD;  Location: [facility] ORS;  Service: Ophthalmology;  Laterality: Left;  CDE: 8.62  . COLONOSCOPY N/A 11/10/2013   Procedure: COLONOSCOPY;  Surgeon: Rogene Houston, MD;  Location: [facility] ENDO SUITE;  Service: Endoscopy;  Laterality: N/A;  240  . EYE SURGERY  2011   Catarct Right  . EYE SURGERY  08-2011   Right Yag procedure  . JOINT REPLACEMENT  08-2010   Left total knee  . KNEE ARTHROSCOPY  2010   Right  .  KNEE ARTHROSCOPY  2003   Left  . TOTAL KNEE ARTHROPLASTY  11/03/2011   Procedure: TOTAL KNEE ARTHROPLASTY;  Surgeon: Gearlean Alf, MD;  Location: [facility] ORS;  Service: Orthopedics;  Laterality: Right;  . TUBAL LIGATION    . WRIST SURGERY Bilateral 03-2010  . WRIST SURGERY  2008   Right    FAMILY HISTORY: family history includes  Breast cancer (age of onset: 13) in her sister; Lymphoma in her maternal aunt.  SOCIAL HISTORY:  reports that she has never smoked. She has never used smokeless tobacco. She reports that she does not drink alcohol or use drugs.  ALLERGIES: Fiorinal-codeine #3 [butalbital-asa-caff-codeine]; Lansoprazole; Latex; Butalbital-aspirin-caffeine; Sulfa antibiotics; and Sulfasalazine  MEDICATIONS:  Current Outpatient Medications  Medication Sig Dispense Refill  . atorvastatin (LIPITOR) 10 MG tablet Take 10 mg by mouth at bedtime.    . Calcium Carb-Cholecalciferol (CALCIUM 600+D) 600-800 MG-UNIT TABS Take by mouth 2 (two) times daily.    . cyclobenzaprine (FLEXERIL) 10 MG tablet Take 10 mg by mouth as needed for muscle spasms.    Marland Kitchen dicyclomine (BENTYL) 10 MG capsule Take 1 capsule (10 mg total) by mouth 3 (three) times daily as needed for spasms. Patient states that she started this 3 weeks ago 270 capsule 1  . escitalopram (LEXAPRO) 5 MG tablet Take 5 mg by mouth daily.    . hydrochlorothiazide (HYDRODIURIL) 12.5 MG tablet Take 12.5 mg by mouth daily.     Marland Kitchen loperamide (IMODIUM) 2 MG capsule Take 2 mg by mouth daily before breakfast.    . meloxicam (MOBIC) 7.5 MG tablet Take 7.5 mg by mouth as needed for pain.    . Menthol, Topical Analgesic, (ICY HOT) 5 % PADS as needed. Patient wears as needed for pain.     No current facility-administered medications for this encounter.     REVIEW OF SYSTEMS:  REVIEW OF SYSTEMS: A 10+ POINT REVIEW OF SYSTEMS WAS OBTAINED including neurology, dermatology, psychiatry, cardiac, respiratory, lymph, extremities, GI, GU, musculoskeletal, constitutional, reproductive, HEENT. All pertinent positives are noted in the HPI. All others are negative.   PHYSICAL EXAM:  Vitals with BMI 08/12/2017  Height 5' 5"   Weight 212 lbs 5 oz  BMI 65.68  Systolic 127  Diastolic 71  Pulse 75  Respirations 17    General: Alert and oriented, in no acute distress HEENT: Head is  normocephalic. Extraocular movements are intact. Oropharynx is clear. Neck: Neck is supple, no palpable cervical or supraclavicular lymphadenopathy. Heart: Regular in rate and rhythm with no murmurs, rubs, or gallops. Chest: Clear to auscultation bilaterally, with no rhonchi, wheezes, or rales. Abdomen: Soft, nontender, nondistended, with no rigidity or guarding. Extremities: No cyanosis or edema. Lymphatics: see Neck Exam Skin: No concerning lesions. Musculoskeletal: symmetric strength and muscle tone throughout. Neurologic: Cranial nerves II through XII are grossly intact. No obvious focalities. Speech is fluent. Coordination is intact. Psychiatric: Judgment and insight are intact. Affect is appropriate. Breast: Right breast with no palpable mass, nipple discharge, or bleeding. Left breast with significant bruising in central aspect of breast from recent biospy. She has a palpable area of induration in the UOQ adjacent to the areolar border which measures approximately 2 x 2 cm in size. Patient also has a large scar in the UOQ from her LCIS lumpectomy. No nipple discharge or bleeding.    KPS = 100  100 - Normal; no complaints; no evidence of disease. 90   - Able to carry on normal activity; minor signs or symptoms  of disease. 80   - Normal activity with effort; some signs or symptoms of disease. 11   - Cares for self; unable to carry on normal activity or to do active work. 60   - Requires occasional assistance, but is able to care for most of his personal needs. 50   - Requires considerable assistance and frequent medical care. 89   - Disabled; requires special care and assistance. 73   - Severely disabled; hospital admission is indicated although death not imminent. 85   - Very sick; hospital admission necessary; active supportive treatment necessary. 10   - Moribund; fatal processes progressing rapidly. 0     - Dead  Karnofsky DA, Abelmann [place], Craver LS and Burchenal JH [phone]) The  use of the nitrogen mustards in the palliative treatment of carcinoma: with particular reference to bronchogenic carcinoma Cancer 1 634-56  LABORATORY DATA:  Lab Results  Component Value Date   WBC 5.3 08/12/2017   HGB 13.6 08/12/2017   HCT 41.1 08/12/2017   MCV 93.6 08/12/2017   PLT 170 08/12/2017   Lab Results  Component Value Date   NA 142 08/12/2017   K 3.3 (L) 08/12/2017   CL 108 08/12/2017   CO2 25 08/12/2017   Lab Results  Component Value Date   ALT 19 08/12/2017   AST 14 (L) 08/12/2017   ALKPHOS 81 08/12/2017   BILITOT 0.6 08/12/2017    RADIOGRAPHY: Mm Clip Placement Left  Result Date: 08/05/2017 CLINICAL DATA:  Evaluate biopsy marker placement EXAM: DIAGNOSTIC LEFT MAMMOGRAM POST STEREOTACTIC BIOPSIES COMPARISON:  Previous exam(s). FINDINGS: Mammographic images were obtained following stereotactic guided biopsy of 2 groups of left breast calcifications. There is a coil shaped clip at the site of the biopsy calcifications in the upper outer anterior left breast. There is a X shaped clip at the site of biopsied calcifications in the central superior left breast. The clips are 4.0 cm apart on the CC view and 4.8 cm on the 90 degree lateral view. Of note, the inferiorly located calcifications seen in diagnostic mammography are actually within the skin and require no additional follow-up. IMPRESSION: Biopsy clip placement as above. Final Assessment: Post Procedure Mammograms for Marker Placement Electronically Signed   By: Dorise Bullion III M.D   On: 08/05/2017 13:55   Mm Lt Breast Bx W Loc Dev 1st Lesion Image Bx Spec Stereo Guide  Addendum Date: 08/10/2017   ADDENDUM REPORT: 08/07/2017 12:39 ADDENDUM: Pathology revealed GRADE II INVASIVE MAMMARY CARCINOMA, MAMMARY CARCINOMA IN SITU WITH CALCIFICATIONS, COMPLEX SCLEROSING LESION of the Left breast, upper outer anterior. E-cadherin is negative in the invasive and in situ components, consistent with a lobular phenotype. This was  found to be concordant by Dr. Dorise Bullion. Pathology revealed BENIGN BREAST TISSUE of the Left breast, upper outer posterior. This was found to be discordant by Dr. Dorise Bullion, with excision recommended. Pathology results were discussed with the patient by telephone. The patient reported doing well after the biopsies with tenderness at the sites. Post biopsy instructions and care were reviewed and questions were answered. The patient was encouraged to call [facility] for any additional concerns. The patient was referred to [facility] at [facility] on August 12, 2017. Recommendation for a bilateral breast MRI for further evaluation of extent of disease and due to the lobular histology. Pathology results reported by Terie Purser, RN on 08/07/2017. Electronically Signed   By: Shanon Brow  Jimmye Norman III M.D   On: 08/07/2017 12:39   Result Date: 08/10/2017 CLINICAL DATA:  Two groups of calcifications were biopsied in the left breast. A group in the upper outer anterior left breast was biopsied. On diagnostic mammography, a second group was thought to be located laterally/centrally and inferiorly. On today's stereotactic biopsy, the inferiorly located calcifications were noted to be within the skin. The anterior aspect of the lateral/central left breast calcifications were noted to be superiorly located rather than inferiorly located. The calcifications in the superior lateral/central left breast were biopsied today. EXAM: LEFT BREAST STEREOTACTIC CORE NEEDLE BIOPSY COMPARISON:  Previous exams. FINDINGS: The patient and I discussed the procedure of stereotactic-guided biopsy including benefits and alternatives. We discussed the high likelihood of a successful procedure. We discussed the risks of the procedure including infection, bleeding, tissue injury, clip migration, and inadequate sampling. Informed written consent was given.  The usual time out protocol was performed immediately prior to the procedure. Using sterile technique and 1% Lidocaine as local anesthetic, under stereotactic guidance, a 9 gauge vacuum assisted device was used to perform core needle biopsy of calcifications in the upper outer anterior left breast using a lateral approach. Specimen radiograph was performed showing calcifications in several specimens. Specimens with calcifications are identified for pathology. Lesion quadrant: Upper-outer At the conclusion of the procedure, a coil shaped tissue marker clip was deployed into the biopsy cavity. Follow-up 2-view mammogram was performed and dictated separately. Using sterile technique and 1% Lidocaine as local anesthetic, under stereotactic guidance, a 9 gauge vacuum assisted device was used to perform core needle biopsy of calcifications in the superolateral/central left breast using a superior approach. Specimen radiograph was performed showing calcifications in several specimens. Specimens with calcifications are identified for pathology. Lesion quadrant: Superior lateral/central At the conclusion of the procedure, a X shaped tissue marker clip was deployed into the biopsy cavity. Follow-up 2-view mammogram was performed and dictated separately. IMPRESSION: Stereotactic-guided biopsy of left breast calcifications. No apparent complications. Electronically Signed: By: Dorise Bullion III M.D On: 08/05/2017 14:00   Mm Lt Breast Bx W Loc Dev Ea Ad Lesion Img Bx Spec Stereo Guide  Addendum Date: 08/10/2017   ADDENDUM REPORT: 08/07/2017 12:39 ADDENDUM: Pathology revealed GRADE II INVASIVE MAMMARY CARCINOMA, MAMMARY CARCINOMA IN SITU WITH CALCIFICATIONS, COMPLEX SCLEROSING LESION of the Left breast, upper outer anterior. E-cadherin is negative in the invasive and in situ components, consistent with a lobular phenotype. This was found to be concordant by Dr. Dorise Bullion. Pathology revealed BENIGN BREAST TISSUE of the  Left breast, upper outer posterior. This was found to be discordant by Dr. Dorise Bullion, with excision recommended. Pathology results were discussed with the patient by telephone. The patient reported doing well after the biopsies with tenderness at the sites. Post biopsy instructions and care were reviewed and questions were answered. The patient was encouraged to call [facility] for any additional concerns. The patient was referred to [facility] on August 12, 2017. Recommendation for a bilateral breast MRI for further evaluation of extent of disease and due to the lobular histology. Pathology results reported by Terie Purser, RN on 08/07/2017. Electronically Signed   By: Dorise Bullion III M.D   On: 08/07/2017 12:39   Result Date: 08/10/2017 CLINICAL DATA:  Two groups of calcifications were biopsied in the left breast. A group in the upper outer anterior left breast was biopsied. On diagnostic mammography, a  second group was thought to be located laterally/centrally and inferiorly. On today's stereotactic biopsy, the inferiorly located calcifications were noted to be within the skin. The anterior aspect of the lateral/central left breast calcifications were noted to be superiorly located rather than inferiorly located. The calcifications in the superior lateral/central left breast were biopsied today. EXAM: LEFT BREAST STEREOTACTIC CORE NEEDLE BIOPSY COMPARISON:  Previous exams. FINDINGS: The patient and I discussed the procedure of stereotactic-guided biopsy including benefits and alternatives. We discussed the high likelihood of a successful procedure. We discussed the risks of the procedure including infection, bleeding, tissue injury, clip migration, and inadequate sampling. Informed written consent was given. The usual time out protocol was performed immediately prior to the procedure. Using sterile  technique and 1% Lidocaine as local anesthetic, under stereotactic guidance, a 9 gauge vacuum assisted device was used to perform core needle biopsy of calcifications in the upper outer anterior left breast using a lateral approach. Specimen radiograph was performed showing calcifications in several specimens. Specimens with calcifications are identified for pathology. Lesion quadrant: Upper-outer At the conclusion of the procedure, a coil shaped tissue marker clip was deployed into the biopsy cavity. Follow-up 2-view mammogram was performed and dictated separately. Using sterile technique and 1% Lidocaine as local anesthetic, under stereotactic guidance, a 9 gauge vacuum assisted device was used to perform core needle biopsy of calcifications in the superolateral/central left breast using a superior approach. Specimen radiograph was performed showing calcifications in several specimens. Specimens with calcifications are identified for pathology. Lesion quadrant: Superior lateral/central At the conclusion of the procedure, a X shaped tissue marker clip was deployed into the biopsy cavity. Follow-up 2-view mammogram was performed and dictated separately. IMPRESSION: Stereotactic-guided biopsy of left breast calcifications. No apparent complications. Electronically Signed: By: Dorise Bullion III M.D On: 08/05/2017 14:00      IMPRESSION: Stage IA, cT1c cNx cMx, Left Breast, UOQ, Invasive Lobular Carcinoma, ER (+), PR (neg), HER2 (+), grade 2   Patient will be a good candidate for breast conservation with radiotherapy to left breast. She does desire breast conserving surgery and not mastectomy.  Today, I talked to the patient and her husband about the findings and work-up thus far.  We discussed the natural history of breast cancer and general treatment, highlighting the role of radiotherapy in the management.  We discussed the available radiation techniques, and focused on the details of logistics and  delivery.  We reviewed the anticipated acute and late sequelae associated with radiation in this setting.  The patient was encouraged to ask questions that I answered to the best of my ability.   PLAN:   1. MRI 2. Lumpectomy and sentinel node biopsy 3. Port placement 4. Adjuvant chemotherapy 5. Radiation therapy 6. Aromatase Inhibitor 7. Herceptin for full year   ------------------------------------------------  Blair Promise, PhD, MD   This document serves as a record of services personally performed by Gery Pray, MD. It was created on his behalf by Steva Colder, a trained medical scribe. The creation of this record is based on the scribe's personal observations and the provider's statements to them. This document has been checked and approved by the attending provider.

## 2017-08-12 NOTE — Progress Notes (Signed)
Nutrition Assessment  Reason for Assessment:  Pt seen in Breast Clinic  ASSESSMENT:   72 year old female with new diagnosis of breast cancer.  Past medical history reviewed.   Patient reports normal appetite.  Medications:  Reviewed   Labs: glucose 115, K 3.3  Anthropometrics:   Height: 65 inches Weight: 212 lb 4.8 oz BMI: 35   NUTRITION DIAGNOSIS: Food and nutrition related knowledge deficit related to new diagnosis of breast cancer as evidenced by no prior need for nutrition related information.  INTERVENTION:   Discussed and provided packet of information regarding nutritional tips for breast cancer patients.  Questions answered.  Teachback method used.  Contact information provided and patient knows to contact me with questions/concerns.    MONITORING, EVALUATION, and GOAL: Pt will consume a healthy plant based diet to maintain lean body mass throughout treatment.   Delanda Bulluck B. Zenia Resides, [place], [place] Registered Dietitian [phone] (pager)

## 2017-08-12 NOTE — Progress Notes (Signed)
Clinical Social Work [place] Psychosocial Distress Screening [place]  Patient completed distress screening protocol and scored a 1 on the Psychosocial Distress Thermometer which indicates mild distress. Clinical Social Worker met with patient and patients family in [facility] to assess for distress and other psychosocial needs. Patient stated she was feeling overwhelmed but felt "better" after meeting with the treatment team and getting more information on her treatment plan. CSW and patient discussed common feeling and emotions when being diagnosed with cancer, and the importance of support during treatment. CSW informed patient of the support team and support services at [facility]. CSW provided contact information and encouraged patient to call with any questions or concerns.  ONCBCN DISTRESS SCREENING 08/12/2017  Screening Type Initial Screening  Distress experienced in past week (1-10) 1  Emotional problem type Nervousness/Anxiety  Physical Problem type Pain;Constipation/diarrhea;Swollen arms/legs  Physician notified of physical symptoms Yes     Johnnye Lana, MSW, LCSW, OSW-C Clinical Social Worker [place] [phone]

## 2017-08-13 ENCOUNTER — Telehealth: Payer: Self-pay | Admitting: Hematology and Oncology

## 2017-08-13 NOTE — Telephone Encounter (Signed)
No 8/1 los, orders.

## 2017-08-17 ENCOUNTER — Telehealth: Payer: Self-pay | Admitting: *Deleted

## 2017-08-17 ENCOUNTER — Ambulatory Visit (HOSPITAL_COMMUNITY)
Admission: RE | Admit: 2017-08-17 | Discharge: 2017-08-17 | Disposition: A | Discharge status: Home / Self Care | Payer: Medicare Other | Source: Ambulatory Visit | Attending: Hematology and Oncology | Admitting: Hematology and Oncology

## 2017-08-17 ENCOUNTER — Other Ambulatory Visit: Payer: Self-pay | Admitting: General Surgery

## 2017-08-17 DIAGNOSIS — I509 Heart failure, unspecified: Secondary | ICD-10-CM | POA: Insufficient documentation

## 2017-08-17 DIAGNOSIS — Z170 Estrogen receptor positive status [ER+]: Secondary | ICD-10-CM | POA: Diagnosis not present

## 2017-08-17 DIAGNOSIS — I081 Rheumatic disorders of both mitral and tricuspid valves: Secondary | ICD-10-CM | POA: Insufficient documentation

## 2017-08-17 DIAGNOSIS — C50412 Malignant neoplasm of upper-outer quadrant of left female breast: Secondary | ICD-10-CM | POA: Diagnosis not present

## 2017-08-17 DIAGNOSIS — Z01818 Encounter for other preprocedural examination: Secondary | ICD-10-CM | POA: Diagnosis not present

## 2017-08-17 DIAGNOSIS — I110 Hypertensive heart disease with heart failure: Secondary | ICD-10-CM | POA: Diagnosis not present

## 2017-08-17 DIAGNOSIS — C50912 Malignant neoplasm of unspecified site of left female breast: Secondary | ICD-10-CM

## 2017-08-17 NOTE — Progress Notes (Signed)
  Echocardiogram 2D Echocardiogram has been performed.  Sylvia Barnett 08/17/2017, 10:57 AM

## 2017-08-17 NOTE — Telephone Encounter (Signed)
Spoke to pt concerning [place] from 08/12/17. Denies questions or concerns regarding dx or treatment care plan. Encourage pt to call with needs. Received verbal understanding.

## 2017-08-18 ENCOUNTER — Ambulatory Visit (HOSPITAL_COMMUNITY): Payer: Medicare Other | Attending: Family Medicine | Admitting: Physical Therapy

## 2017-08-18 DIAGNOSIS — M25612 Stiffness of left shoulder, not elsewhere classified: Secondary | ICD-10-CM | POA: Diagnosis not present

## 2017-08-18 DIAGNOSIS — C50412 Malignant neoplasm of upper-outer quadrant of left female breast: Secondary | ICD-10-CM | POA: Insufficient documentation

## 2017-08-18 DIAGNOSIS — M79602 Pain in left arm: Secondary | ICD-10-CM | POA: Diagnosis not present

## 2017-08-18 DIAGNOSIS — Z170 Estrogen receptor positive status [ER+]: Secondary | ICD-10-CM | POA: Insufficient documentation

## 2017-08-18 DIAGNOSIS — R293 Abnormal posture: Secondary | ICD-10-CM | POA: Insufficient documentation

## 2017-08-18 NOTE — Therapy (Signed)
[place], [place], [address] Phone: [phone]   Fax:  [phone]  Physical Therapy Treatment  Patient Details  Name: Sylvia Barnett MRN: [id number] Date of Birth: 07/14/1945 Referring Provider: Dr. Excell Seltzer   Encounter Date: 08/18/2017  PT End of Session - 08/18/17 1335    Visit Number  2    Number of Visits  8    Date for PT Re-Evaluation  09/09/17    PT Start Time  0908    PT Stop Time  0948    PT Time Calculation (min)  40 min    Activity Tolerance  Patient tolerated treatment well    Behavior During Therapy  [facility] for tasks assessed/performed       Past Medical History:  Diagnosis Date  . Anxiety   . Arthritis    Right hip  . Cancer [facility]) 2004    Breast Lumpectomy  . Complication of anesthesia    Has a small throat  . Depression   . Hypercholesteremia   . Hypertension   . IBS (irritable bowel syndrome)   . Incontinence     Past Surgical History:  Procedure Laterality Date  . ABDOMINAL HYSTERECTOMY  2002  . APPENDECTOMY  1949  . BIOPSY BREAST  2010   Left  . Bladder Tack  2002  . BREAST LUMPECTOMY  2004   Left  . BREAST LUMPECTOMY  2004   Left  . BREAST LUMPECTOMY  2006   Left  . BREAST LUMPECTOMY  2000   Left  . BUNIONECTOMY  1994   Right  . CATARACT EXTRACTION W/PHACO Left 08/18/2013   Procedure: CATARACT EXTRACTION PHACO AND INTRAOCULAR LENS PLACEMENT (IOC);  Surgeon: Tonny Branch, MD;  Location: [facility]S;  Service: Ophthalmology;  Laterality: Left;  CDE: 8.62  . COLONOSCOPY N/A 11/10/2013   Procedure: COLONOSCOPY;  Surgeon: Rogene Houston, MD;  Location: [facility]O [facility];  Service: Endoscopy;  Laterality: N/A;  240  . EYE SURGERY  2011   Catarct Right  . EYE SURGERY  08-2011   Right Yag procedure  . JOINT REPLACEMENT  08-2010   Left total knee  . KNEE ARTHROSCOPY  2010   Right  . KNEE ARTHROSCOPY  2003   Left  . TOTAL KNEE ARTHROPLASTY  11/03/2011   Procedure: TOTAL  KNEE ARTHROPLASTY;  Surgeon: Gearlean Alf, MD;  [facility]: [facility];  Service: Orthopedics;  Laterality: Right;  . TUBAL LIGATION    . WRIST SURGERY Bilateral 03-2010  . WRIST SURGERY  2008   Right    There were no vitals filed for this visit.  Subjective Assessment - 08/18/17 0920    Subjective  Pt states she had a lumpectomy in 2000 and is going to have to have another one and radiation following for Lt side, however needs better Lt shoulder ROM for positioning with radiation.  States the pain radiates into neck at times and varies in pain 3-10/10.  Currently 4/10.    Pertinent History  Patient was diagnosed on 07/29/17 with left grade II invasive lobular carcinoma breast cancer. It measures 1.1 cm and is located in the lower outer quadrant. It is ER positive, PR negative, and HER2 positive with a Ki67 of 1%. She has a previous left breast LCIS in 2000 and had a lumpectomy for that. She has had both knee replaced: right 2012 and left 2013. She fractured her left elbow in 07/2016 when she fell playing kickball with her  grandchild.    Currently in Pain?  Yes    Pain Score  4     Pain Location  Shoulder    Pain Orientation  Left    Pain Radiating Towards  radiates into elbow and into neck.                        [place] Adult PT Treatment/Exercise - 08/18/17 0001      Shoulder Exercises: Supine   Flexion  Both;10 reps;Limitations    Flexion Limitations  AAROM with 1# cane      Shoulder Exercises: Seated   Elevation  Both;10 reps    Retraction  Both;10 reps    Retraction Limitations  wbacks    Other Seated Exercises  cervical retration 10 reps      Shoulder Exercises: Standing   Other Standing Exercises  wall slides for flexion 10 reps      Shoulder Exercises: Pulleys   Flexion  2 minutes    ABduction  2 minutes      Manual Therapy   Manual Therapy  Passive ROM    Manual therapy comments  completed at end of session    Passive ROM  supine for Lt shoulder flexion and  abduction             PT Education - 08/18/17 1334    Education Details  further lymphedema education, reviewe of HEP and goals.     Person(s) Educated  Patient    Methods  Explanation;Demonstration;Tactile cues;Verbal cues;Handout    Comprehension  Verbalized understanding;Returned demonstration;Verbal cues required;Tactile cues required          PT Long Term Goals - 08/12/17 1202      PT LONG TERM GOAL #1   Title  Patient will be independent with her HEP for increasing shoulder ROM.    Time  4    Period  Weeks    Status  New      PT LONG TERM GOAL #2   Title  Patient will increase left shoulder active flexion to >/= 130 degrees for increased ease reaching overhead.    Time  4    Period  Weeks    Status  New      PT LONG TERM GOAL #3   Title  Patient will increase left shoulder active abduction to >/= 130 degrees for increased ease obtaining radiation positioning.    Time  4    Period  Weeks    Status  New      PT LONG TERM GOAL #4   Title  Patient will report >/= 40% reduction in left arm pain to tolerate performing normal daily tasks.    Time  4    Period  Weeks    Status  New      Breast Clinic Goals - 08/12/17 1201      Patient will be able to verbalize understanding of pertinent lymphedema risk reduction practices relevant to her diagnosis specifically related to skin care.   Time  1    Period  Days    Status  Achieved      Patient will be able to return demonstrate and/or verbalize understanding of the post-op home exercise program related to regaining shoulder range of motion.   Time  1    Period  Days    Status  Achieved      Patient will be able to verbalize understanding of the importance of attending the postoperative  After Breast Cancer Class for further lymphedema risk reduction education and therapeutic exercise.   Time  1    Period  Days    Status  Achieved           Plan - 08/18/17 1335    Clinical Impression Statement  reviewed  evaluation for goals and HEP.  Pt reprots compliance with HEP.  Continues to be tight in her Rt shoulder.  Began with pulleys for AAROM and progressed to supine stretching, both AAROM and  PROM.  Able to achieve much improved flexion and abduction as compared to initial evaluation.      Rehab Potential  Excellent    Clinical Impairments Affecting Rehab Potential  Active cancer    PT Frequency  2x / week    PT Duration  4 weeks    PT Treatment/Interventions  ADLs/Self Care Home Management;Therapeutic activities;Therapeutic exercise;Manual techniques;Passive range of motion;Dry needling;Patient/family education;Iontophoresis 58m/ml Dexamethasone;Moist Heat Heat to left arm to only be done prior to surgery as she will be at risk for lymphedema after surgery    PT Next Visit Plan  continue with ROM focus of Left shoulder.    PT Home Exercise Plan  POst op shoulder ROM HEP for after breast surgery    Consulted and Agree with Plan of Care  Patient;Family member/caregiver    Family Member Consulted  Husband       Patient will benefit from skilled therapeutic intervention in order to improve the following deficits and impairments:  Decreased range of motion, Pain, Impaired UE functional use, Decreased strength, Decreased knowledge of precautions  Visit Diagnosis: Malignant neoplasm of upper-outer quadrant of left breast in female, estrogen receptor positive (HCC)  Abnormal posture  Pain in left arm  Stiffness of left shoulder, not elsewhere classified     Problem List Patient Active Problem List   Diagnosis Date Noted  . Malignant neoplasm of upper-outer quadrant of left breast in female, estrogen receptor positive ([place] 08/11/2017  . Microscopic colitis 08/31/2014  . Family hx of colon cancer 10/27/2013  . Essential hypertension 10/27/2013  . High cholesterol 10/27/2013  . Breast cancer ([place] 10/27/2013  . Postop Acute blood loss anemia 11/04/2011  . OA (osteoarthritis) of knee  11/03/2011   ATeena Irani PTA/CLT [phone] FTeena Irani8/06/2017, 1:43 PM  [place] [address]Phone: [phone]  Fax:  [phone] Name: PPEREL HAUSCHILDMRN: [id number] of Birth: 6Dec 18, 1947

## 2017-08-19 ENCOUNTER — Ambulatory Visit (HOSPITAL_COMMUNITY)
Admission: RE | Admit: 2017-08-19 | Discharge: 2017-08-19 | Disposition: A | Discharge status: Home / Self Care | Payer: Medicare Other | Source: Ambulatory Visit | Attending: Hematology and Oncology | Admitting: Hematology and Oncology

## 2017-08-19 ENCOUNTER — Telehealth: Payer: Self-pay | Admitting: Hematology and Oncology

## 2017-08-19 DIAGNOSIS — Z170 Estrogen receptor positive status [ER+]: Secondary | ICD-10-CM | POA: Insufficient documentation

## 2017-08-19 DIAGNOSIS — R928 Other abnormal and inconclusive findings on diagnostic imaging of breast: Secondary | ICD-10-CM | POA: Insufficient documentation

## 2017-08-19 DIAGNOSIS — C50412 Malignant neoplasm of upper-outer quadrant of left female breast: Secondary | ICD-10-CM | POA: Diagnosis not present

## 2017-08-19 MED ORDER — GADOBENATE DIMEGLUMINE 529 MG/ML IV SOLN
20.0000 mL | Freq: Once | INTRAVENOUS | Status: AC | PRN
  Administered 2017-08-19: 20 mL via INTRAVENOUS

## 2017-08-19 NOTE — Telephone Encounter (Signed)
Left message for patient regarding upcoming sept appts per 8/5 sch message.

## 2017-08-20 ENCOUNTER — Ambulatory Visit (HOSPITAL_COMMUNITY): Payer: Medicare Other

## 2017-08-20 ENCOUNTER — Encounter (HOSPITAL_COMMUNITY): Payer: Self-pay

## 2017-08-20 ENCOUNTER — Telehealth: Payer: Self-pay | Admitting: Hematology and Oncology

## 2017-08-20 ENCOUNTER — Telehealth (HOSPITAL_COMMUNITY): Payer: Self-pay

## 2017-08-20 DIAGNOSIS — Z170 Estrogen receptor positive status [ER+]: Secondary | ICD-10-CM | POA: Diagnosis not present

## 2017-08-20 DIAGNOSIS — C50412 Malignant neoplasm of upper-outer quadrant of left female breast: Secondary | ICD-10-CM | POA: Diagnosis not present

## 2017-08-20 DIAGNOSIS — R293 Abnormal posture: Secondary | ICD-10-CM | POA: Diagnosis not present

## 2017-08-20 DIAGNOSIS — M25612 Stiffness of left shoulder, not elsewhere classified: Secondary | ICD-10-CM | POA: Diagnosis not present

## 2017-08-20 DIAGNOSIS — M79602 Pain in left arm: Secondary | ICD-10-CM

## 2017-08-20 NOTE — Therapy (Signed)
[place], [place], [address] Phone: [phone]   Fax:  [phone]  Physical Therapy Treatment  Patient Details  Name: Sylvia Barnett MRN: [id number] Date of Birth: 03-24-1945 Referring Provider: Dr. Excell Seltzer   Encounter Date: 08/20/2017  PT End of Session - 08/20/17 1127    Visit Number  3    Number of Visits  8    Date for PT Re-Evaluation  09/09/17    Authorization Type  Medicare A & B    Authorization Time Period  7/31--. 09/09/17    PT Start Time  1120    PT Stop Time  1158    PT Time Calculation (min)  38 min    Activity Tolerance  Patient tolerated treatment well    Behavior During Therapy  [facility] for tasks assessed/performed       Past Medical History:  Diagnosis Date  . Anxiety   . Arthritis    Right hip  . Cancer [facility]) 2004    Breast Lumpectomy  . Complication of anesthesia    Has a small throat  . Depression   . Hypercholesteremia   . Hypertension   . IBS (irritable bowel syndrome)   . Incontinence     Past Surgical History:  Procedure Laterality Date  . ABDOMINAL HYSTERECTOMY  2002  . APPENDECTOMY  1949  . BIOPSY BREAST  2010   Left  . Bladder Tack  2002  . BREAST LUMPECTOMY  2004   Left  . BREAST LUMPECTOMY  2004   Left  . BREAST LUMPECTOMY  2006   Left  . BREAST LUMPECTOMY  2000   Left  . BUNIONECTOMY  1994   Right  . CATARACT EXTRACTION W/PHACO Left 08/18/2013   Procedure: CATARACT EXTRACTION PHACO AND INTRAOCULAR LENS PLACEMENT (IOC);  Surgeon: Tonny Branch, MD;  Location: [facility];  Service: Ophthalmology;  Laterality: Left;  CDE: 8.62  . COLONOSCOPY N/A 11/10/2013   Procedure: COLONOSCOPY;  Surgeon: Rogene Houston, MD;  [facility];  Service: Endoscopy;  Laterality: N/A;  240  . EYE SURGERY  2011   Catarct Right  . EYE SURGERY  08-2011   Right Yag procedure  . JOINT REPLACEMENT  08-2010   Left total knee  . KNEE ARTHROSCOPY  2010   Right  . KNEE  ARTHROSCOPY  2003   Left  . TOTAL KNEE ARTHROPLASTY  11/03/2011   Procedure: TOTAL KNEE ARTHROPLASTY;  Surgeon: Gearlean Alf, MD;  [facility];  Service: Orthopedics;  Laterality: Right;  . TUBAL LIGATION    . WRIST SURGERY Bilateral 03-2010  . WRIST SURGERY  2008   Right    There were no vitals filed for this visit.  Subjective Assessment - 08/20/17 1123    Subjective  Pt reports she feels looser today following session last session.  Pt reports MRI yesterday for breast, stated she was suprised with ability to hold arms in air for 20 minutes.  Current pain scale 2-3/10.      Pertinent History  Patient was diagnosed on 07/29/17 with left grade II invasive lobular carcinoma breast cancer. It measures 1.1 cm and is located in the lower outer quadrant. It is ER positive, PR negative, and HER2 positive with a Ki67 of 1%. She has a previous left breast LCIS in 2000 and had a lumpectomy for that. She has had both knee replaced: right 2012 and left 2013. She fractured her left elbow in 07/2016  when she fell playing kickball with her grandchild.    Patient Stated Goals  Decrease left arm pain and learn post op shoulder ROM HEP    Currently in Pain?  Yes    Pain Score  3     Pain Location  Shoulder    Pain Orientation  Left    Pain Descriptors / Indicators  Aching    Pain Type  Chronic pain    Pain Radiating Towards  radiated into neck    Pain Onset  More than a month ago    Pain Frequency  Intermittent    Aggravating Factors   Lifting, reaching, carrying    Pain Relieving Factors  Mobic                       [facility] Adult PT Treatment/Exercise - 08/20/17 0001      Exercises   Exercises  Shoulder      Shoulder Exercises: Seated   Retraction  Both;15 reps    Flexion  AROM    Flexion Limitations  158    Abduction  AROM    ABduction Limitations  140    Other Seated Exercises  cervical retration 10 reps      Shoulder Exercises: Standing   Other Standing Exercises   wall slides for flexion 10 reps      Shoulder Exercises: Pulleys   Flexion  2 minutes    ABduction  2 minutes      Shoulder Exercises: Therapy Ball   Flexion  Both;10 reps    ABduction  Left;10 reps    Right/Left  5 reps      Manual Therapy   Manual Therapy  Passive ROM    Manual therapy comments  completed at end of session    Passive ROM  supine for Lt shoulder flexion and abduction                         Plan - 08/20/17 1202    Clinical Impression Statement  Session focus with shoulder mobility.  Added theraball activities to improve AROM.  Pt able to tolerate PROM with reoprts of slight pain at end range, no reports of increased pain at EOS.      Rehab Potential  Excellent    Clinical Impairments Affecting Rehab Potential  Active cancer    PT Frequency  2x / week    PT Duration  4 weeks    PT Treatment/Interventions  ADLs/Self Care Home Management;Therapeutic activities;Therapeutic exercise;Manual techniques;Passive range of motion;Dry needling;Patient/family education;Iontophoresis 38m/ml Dexamethasone;Moist Heat    PT Next Visit Plan  continue with ROM focus of Left shoulder.    PT Home Exercise Plan  POst op shoulder ROM HEP for after breast surgery       Patient will benefit from skilled therapeutic intervention in order to improve the following deficits and impairments:  Decreased range of motion, Pain, Impaired UE functional use, Decreased strength, Decreased knowledge of precautions  Visit Diagnosis: Malignant neoplasm of upper-outer quadrant of left breast in female, estrogen receptor positive (HCC)  Abnormal posture  Pain in left arm  Stiffness of left shoulder, not elsewhere classified     Problem List Patient Active Problem List   Diagnosis Date Noted  . Malignant neoplasm of upper-outer quadrant of left breast in female, estrogen receptor positive ([place] 08/11/2017  . Microscopic colitis 08/31/2014  . Family hx of colon cancer  10/27/2013  . Essential hypertension 10/27/2013  .  High cholesterol 10/27/2013  . Breast cancer ([place]) 10/27/2013  . Postop Acute blood loss anemia 11/04/2011  . OA (osteoarthritis) of knee 11/03/2011   Ihor Austin, LPTA; CBIS [phone]  Aldona Lento 08/20/2017, 12:06 PM  [facility] [address] [place], [place], [address] Phone: [phone]   Fax:  [phone]  Name: Sylvia Barnett MRN: [id number] Date of Birth: 1945-06-07

## 2017-08-20 NOTE — Telephone Encounter (Signed)
No Show #1: I called Ms. Doane to check in with her as she missed her 9:45 AM appointment today. She stated she forgot and asked if there are any openings later today. I informed her there is an 11:15 am slot available and she said she would like to come in for that appointment. I will move her appointment to the new time.  Kipp Brood, PT, DPT Physical Therapist with [facility]  08/20/2017 10:13 AM

## 2017-08-20 NOTE — Telephone Encounter (Signed)
Patient called to reschedule  °

## 2017-08-21 ENCOUNTER — Telehealth: Payer: Self-pay | Admitting: Hematology and Oncology

## 2017-08-21 NOTE — Telephone Encounter (Signed)
lvm for patient to schedule appt per 8/5 sch message

## 2017-08-24 ENCOUNTER — Telehealth (INDEPENDENT_AMBULATORY_CARE_PROVIDER_SITE_OTHER): Payer: Self-pay | Admitting: Internal Medicine

## 2017-08-24 ENCOUNTER — Telehealth: Payer: Self-pay

## 2017-08-24 NOTE — Telephone Encounter (Signed)
Dr.Rehman was made aware. 

## 2017-08-24 NOTE — Telephone Encounter (Signed)
Patient was last seen in our office August 04 2017. The plan was as follows. Continue dicyclomine 10 mg before breakfast and evening meal or before lunch. Continue loperamide OTC 2 mg every morning. Patient will call if diarrhea and urgency worsens in which case we will try cholestyramine and/or Pepto-Bismol. Office visit in 1 year.  Dr.Rehman will be made aware of the progress report.

## 2017-08-24 NOTE — Telephone Encounter (Signed)
Patient called to schedule her chemo ed consult, after talking with  Kathlee Nations RN) [place]. She said to schedule patient after the o/v on 9/11. Per 8/12 phone que

## 2017-08-24 NOTE — Telephone Encounter (Signed)
Patient called to let NUR know how the new medication is doing - patient stated she had diarrhea for about a week when she started taking it and now 1 pill a day seems to be helping

## 2017-08-25 ENCOUNTER — Ambulatory Visit (HOSPITAL_COMMUNITY): Payer: Medicare Other

## 2017-08-25 DIAGNOSIS — C50412 Malignant neoplasm of upper-outer quadrant of left female breast: Secondary | ICD-10-CM

## 2017-08-25 DIAGNOSIS — M79602 Pain in left arm: Secondary | ICD-10-CM

## 2017-08-25 DIAGNOSIS — Z170 Estrogen receptor positive status [ER+]: Principal | ICD-10-CM

## 2017-08-25 DIAGNOSIS — R293 Abnormal posture: Secondary | ICD-10-CM | POA: Diagnosis not present

## 2017-08-25 DIAGNOSIS — M25612 Stiffness of left shoulder, not elsewhere classified: Secondary | ICD-10-CM

## 2017-08-25 NOTE — Therapy (Signed)
[place] [facility] [address] [place], [place], [address] Phone: [phone]   Fax:  [phone]  Physical Therapy Treatment  Patient Details  Name: Sylvia Barnett MRN: [id number] Date of Birth: 09/30/1945 Referring Provider: Dr. Benjamin Hoxworth   Encounter Date: 08/25/2017  PT End of Session - 08/25/17 1106    Visit Number  4    Number of Visits  8    Date for PT Re-Evaluation  09/09/17    Authorization Type  Medicare A & B    Authorization Time Period  7/31--. 09/09/17    PT Start Time  1034    PT Stop Time  1118    PT Time Calculation (min)  44 min    Activity Tolerance  Patient tolerated treatment well    Behavior During Therapy  W[facility] for tasks assessed/performed       Past Medical History:  Diagnosis Date  . Anxiety   . Arthritis    Right hip  . Cancer (HCC) 2004    Breast Lumpectomy  . Complication of anesthesia    Has a small throat  . Depression   . Hypercholesteremia   . Hypertension   . IBS (irritable bowel syndrome)   . Incontinence     Past Surgical History:  Procedure Laterality Date  . ABDOMINAL HYSTERECTOMY  2002  . APPENDECTOMY  1949  . BIOPSY BREAST  2010   Left  . Bladder Tack  2002  . BREAST LUMPECTOMY  2004   Left  . BREAST LUMPECTOMY  2004   Left  . BREAST LUMPECTOMY  2006   Left  . BREAST LUMPECTOMY  2000   Left  . BUNIONECTOMY  1994   Right  . CATARACT EXTRACTION W/PHACO Left 08/18/2013   Procedure: CATARACT EXTRACTION PHACO AND INTRAOCULAR LENS PLACEMENT (IOC);  Surgeon: Kerry Hunt, MD;  Location: [facility];  Service: Ophthalmology;  Laterality: Left;  CDE: 8.62  . COLONOSCOPY N/A 11/10/2013   Procedure: COLONOSCOPY;  Surgeon: Najeeb U Rehman, MD;  [facility];  Service: Endoscopy;  Laterality: N/A;  240  . EYE SURGERY  2011   Catarct Right  . EYE SURGERY  08-2011   Right Yag procedure  . JOINT REPLACEMENT  08-2010   Left total knee  . KNEE ARTHROSCOPY  2010   Right  . KNEE  ARTHROSCOPY  2003   Left  . TOTAL KNEE ARTHROPLASTY  11/03/2011   Procedure: TOTAL KNEE ARTHROPLASTY;  Surgeon: Frank V Aluisio, MD;  [facility];  Service: Orthopedics;  Laterality: Right;  . TUBAL LIGATION    . WRIST SURGERY Bilateral 03-2010  . WRIST SURGERY  2008   Right    There were no vitals filed for this visit.  Subjective Assessment - 08/25/17 1209    Subjective  5/10 pain today. Awaiting more information on how extensive her operation will be 09/11/17. Pain can be in Lt neck, down front of arm and in armpit. Lt armpit hurts the most.     Pertinent History  Patient was diagnosed on 07/29/17 with left grade II invasive lobular carcinoma breast cancer. It measures 1.1 cm and is located in the lower outer quadrant. It is ER positive, PR negative, and HER2 positive with a Ki67 of 1%. She has a previous left breast LCIS in 2000 and had a lumpectomy for that. She has had both knee replaced: right 2012 and left 2013. She fractured her left elbow in 07/2016 when she fell playing kickball   with her grandchild.    Patient Stated Goals  Decrease left arm pain and learn post op shoulder ROM HEP    Currently in Pain?  Yes    Pain Score  5     Pain Location  Shoulder    Pain Orientation  Left    Pain Onset  More than a month ago                       [facility] Adult PT Treatment/Exercise - 08/25/17 0001      Posture/Postural Control   Postural Limitations  Rounded Shoulders;Forward head      Shoulder Exercises: Supine   Flexion  Both;10 reps;Limitations    Flexion Limitations  AAROM with 1# cane      Shoulder Exercises: Seated   Retraction  Both;15 reps    Retraction Limitations  verbal/tactile cuingneeded; reinforcement needed    Other Seated Exercises  cervical retration 10 reps      Shoulder Exercises: Standing   Other Standing Exercises  wall slides for flexion/abd 10 reps      Shoulder Exercises: Pulleys   Flexion  2 minutes    ABduction  2 minutes       Shoulder Exercises: Therapy Ball   Flexion  Both;10 reps    ABduction  Left;10 reps    Right/Left  5 reps      Manual Therapy   Manual Therapy  Passive ROM;Soft tissue mobilization    Manual therapy comments  completed at end of session    Soft tissue mobilization  subscap, terees major/minor, subscap    Passive ROM  supine for Lt shoulder flexion and abduction             PT Education - 08/25/17 1213    Education Details  review of HEP for post op shoulder and symptom control of pain at end range; scapular retraction    Person(s) Educated  Patient    Methods  Explanation;Demonstration;Tactile cues;Verbal cues    Comprehension  Verbalized understanding;Returned demonstration;Verbal cues required;Need further instruction          PT Long Term Goals - 08/25/17 1218      PT LONG TERM GOAL #1   Title  Patient will be independent with her HEP for increasing shoulder ROM.    Time  4    Period  Weeks    Status  On-going      PT LONG TERM GOAL #2   Title  Patient will increase left shoulder active flexion to >/= 130 degrees for increased ease reaching overhead.    Time  4    Period  Weeks    Status  On-going      PT LONG TERM GOAL #3   Title  Patient will increase left shoulder active abduction to >/= 130 degrees for increased ease obtaining radiation positioning.    Time  4    Period  Weeks    Status  On-going      PT LONG TERM GOAL #4   Title  Patient will report >/= 40% reduction in left arm pain to tolerate performing normal daily tasks.    Time  4    Period  Weeks    Status  On-going            Plan - 08/25/17 1110    Clinical Impression Statement  Session focused on shoulder mobility and manual therapy. Added theraball activities to improve AROM, Pt able to tolerate PROM   with reports of pain at end range; soft tissue mobilizations to subscap, infraspinatus and teres major/minor eliciting pain complaints. Pain report decreased to 1/10 at EOS. Continue with  current plan, progressing as able. Ensure HEP understanding and performance, Chin tucks and scapular retraction need to be reviewed with heavy cuing.    Rehab Potential  Excellent    Clinical Impairments Affecting Rehab Potential  Active cancer    PT Frequency  2x / week    PT Duration  4 weeks    PT Treatment/Interventions  ADLs/Self Care Home Management;Therapeutic activities;Therapeutic exercise;Manual techniques;Passive range of motion;Dry needling;Patient/family education;Iontophoresis 19m/ml Dexamethasone;Moist Heat    PT Next Visit Plan  continue with ROM focus of Left shoulder.    PT Home Exercise Plan  POst op shoulder ROM HEP for after breast surgery       Patient will benefit from skilled therapeutic intervention in order to improve the following deficits and impairments:  Decreased range of motion, Pain, Impaired UE functional use, Decreased strength, Decreased knowledge of precautions  Visit Diagnosis: Malignant neoplasm of upper-outer quadrant of left breast in female, estrogen receptor positive (HCC)  Abnormal posture  Pain in left arm  Stiffness of left shoulder, not elsewhere classified     Problem List Patient Active Problem List   Diagnosis Date Noted  . Malignant neoplasm of upper-outer quadrant of left breast in female, estrogen receptor positive ([place] 08/11/2017  . Microscopic colitis 08/31/2014  . Family hx of colon cancer 10/27/2013  . Essential hypertension 10/27/2013  . High cholesterol 10/27/2013  . Breast cancer ([place] 10/27/2013  . Postop Acute blood loss anemia 11/04/2011  . OA (osteoarthritis) of knee 11/03/2011    BFloria Raveling Hartnett-Rands, MS, PT Per DLake [place]##761958/13/2019, 12:19 PM  [place] [address]Phone: [phone]  Fax:  [phone] Name: Sylvia FIGUEROMRN: [id number] of Birth: [birth year]

## 2017-08-26 ENCOUNTER — Ambulatory Visit (HOSPITAL_COMMUNITY): Payer: Medicare Other

## 2017-08-26 ENCOUNTER — Encounter (HOSPITAL_COMMUNITY): Payer: Self-pay

## 2017-08-26 ENCOUNTER — Other Ambulatory Visit: Payer: Self-pay | Admitting: General Surgery

## 2017-08-26 DIAGNOSIS — R293 Abnormal posture: Secondary | ICD-10-CM

## 2017-08-26 DIAGNOSIS — Z170 Estrogen receptor positive status [ER+]: Principal | ICD-10-CM

## 2017-08-26 DIAGNOSIS — C50912 Malignant neoplasm of unspecified site of left female breast: Secondary | ICD-10-CM

## 2017-08-26 DIAGNOSIS — M79602 Pain in left arm: Secondary | ICD-10-CM | POA: Diagnosis not present

## 2017-08-26 DIAGNOSIS — C50412 Malignant neoplasm of upper-outer quadrant of left female breast: Secondary | ICD-10-CM | POA: Diagnosis not present

## 2017-08-26 DIAGNOSIS — M25612 Stiffness of left shoulder, not elsewhere classified: Secondary | ICD-10-CM

## 2017-08-26 NOTE — Therapy (Signed)
[place], [place], [address] Phone: [phone]   Fax:  [phone]  Physical Therapy Treatment  Patient Details  Name: DEFNE GERLING MRN: [id number] Date of Birth: 1945/12/27 Referring Provider: Dr. Excell Seltzer   Encounter Date: 08/26/2017  PT End of Session - 08/26/17 0909    Visit Number  5    Number of Visits  8    Date for PT Re-Evaluation  09/09/17    Authorization Type  Medicare A & B    Authorization Time Period  7/31--. 09/09/17    PT Start Time  0905    PT Stop Time  0948    PT Time Calculation (min)  43 min    Activity Tolerance  Patient tolerated treatment well    Behavior During Therapy  [facility] for tasks assessed/performed       Past Medical History:  Diagnosis Date  . Anxiety   . Arthritis    Right hip  . Cancer [facility]) 2004    Breast Lumpectomy  . Complication of anesthesia    Has a small throat  . Depression   . Hypercholesteremia   . Hypertension   . IBS (irritable bowel syndrome)   . Incontinence     Past Surgical History:  Procedure Laterality Date  . ABDOMINAL HYSTERECTOMY  2002  . APPENDECTOMY  1949  . BIOPSY BREAST  2010   Left  . Bladder Tack  2002  . BREAST LUMPECTOMY  2004   Left  . BREAST LUMPECTOMY  2004   Left  . BREAST LUMPECTOMY  2006   Left  . BREAST LUMPECTOMY  2000   Left  . BUNIONECTOMY  1994   Right  . CATARACT EXTRACTION W/PHACO Left 08/18/2013   Procedure: CATARACT EXTRACTION PHACO AND INTRAOCULAR LENS PLACEMENT (IOC);  Surgeon: Tonny Branch, MD;  Location: [facility];  Service: Ophthalmology;  Laterality: Left;  CDE: 8.62  . COLONOSCOPY N/A 11/10/2013   Procedure: COLONOSCOPY;  Surgeon: Rogene Houston, MD;  Location[facility];  Service: Endoscopy;  Laterality: N/A;  240  . EYE SURGERY  2011   Catarct Right  . EYE SURGERY  08-2011   Right Yag procedure  . JOINT REPLACEMENT  08-2010   Left total knee  . KNEE ARTHROSCOPY  2010   Right  . KNEE  ARTHROSCOPY  2003   Left  . TOTAL KNEE ARTHROPLASTY  11/03/2011   Procedure: TOTAL KNEE ARTHROPLASTY;  Surgeon: Gearlean Alf, MD;  Location: [facility];  Service: Orthopedics;  Laterality: Right;  . TUBAL LIGATION    . WRIST SURGERY Bilateral 03-2010  . WRIST SURGERY  2008   Right    There were no vitals filed for this visit.  Subjective Assessment - 08/26/17 0906    Subjective  Pt stated she has increased pain with certain movements, pain scale 3/10 sore, achey and tightness.      Patient Stated Goals  Decrease left arm pain and learn post op shoulder ROM HEP    Currently in Pain?  Yes    Pain Score  3     Pain Location  Arm    Pain Orientation  Left    Pain Descriptors / Indicators  Aching;Sore;Tightness    Pain Type  Chronic pain    Pain Onset  More than a month ago    Pain Frequency  Intermittent    Aggravating Factors   Lifting, reaching, carrying    Pain Relieving Factors  mobic                       [facility] Adult PT Treatment/Exercise - 08/26/17 0001      Exercises   Exercises  Shoulder      Shoulder Exercises: Supine   Flexion  Both;10 reps;Limitations    Flexion Limitations  AAROM with 1# cane 10x 10" holds      Shoulder Exercises: Seated   Retraction  Both;15 reps    Retraction Limitations  verbal/tactile cuingneeded; reinforcement needed    Other Seated Exercises  cervical retration 10 reps      Shoulder Exercises: Pulleys   Flexion  2 minutes    ABduction  2 minutes      Shoulder Exercises: Therapy Ball   Flexion  Both;10 reps    ABduction  Left;10 reps    Right/Left  10 reps      Manual Therapy   Manual Therapy  Passive ROM;Soft tissue mobilization    Manual therapy comments  completed at end of session    Soft tissue mobilization  subscap, terees major/minor, subscap    Passive ROM  supine for Lt shoulder flexion and abduction and IR                  PT Long Term Goals - 08/25/17 1218      PT LONG TERM GOAL #1   Title   Patient will be independent with her HEP for increasing shoulder ROM.    Time  4    Period  Weeks    Status  On-going      PT LONG TERM GOAL #2   Title  Patient will increase left shoulder active flexion to >/= 130 degrees for increased ease reaching overhead.    Time  4    Period  Weeks    Status  On-going      PT LONG TERM GOAL #3   Title  Patient will increase left shoulder active abduction to >/= 130 degrees for increased ease obtaining radiation positioning.    Time  4    Period  Weeks    Status  On-going      PT LONG TERM GOAL #4   Title  Patient will report >/= 40% reduction in left arm pain to tolerate performing normal daily tasks.    Time  4    Period  Weeks    Status  On-going            Plan - 08/26/17 0933    Clinical Impression Statement  Continue session foucs with shoulder mobility.  Continued AAROM exercises and manual technqiues to improve range and reduce pain with soft tissue mobilization.  Pt does continue to c/o pain at end range though reports decrease with reps.  Continues to require heavy cueing with cervical and scapular retraction.  No reports of increased pain at EOS.      Rehab Potential  Excellent    Clinical Impairments Affecting Rehab Potential  Active cancer    PT Frequency  2x / week    PT Duration  4 weeks    PT Treatment/Interventions  ADLs/Self Care Home Management;Therapeutic activities;Therapeutic exercise;Manual techniques;Passive range of motion;Dry needling;Patient/family education;Iontophoresis 4mg /ml Dexamethasone;Moist Heat    PT Next Visit Plan  continue with ROM focus of Left shoulder.  Next session add UE flexion wiht back to wall and Y lift off for scapular retraction.      PT Home Exercise Plan  POst  op shoulder ROM HEP for after breast surgery       Patient will benefit from skilled therapeutic intervention in order to improve the following deficits and impairments:  Decreased range of motion, Pain, Impaired UE functional  use, Decreased strength, Decreased knowledge of precautions  Visit Diagnosis: Malignant neoplasm of upper-outer quadrant of left breast in female, estrogen receptor positive (HCC)  Abnormal posture  Pain in left arm  Stiffness of left shoulder, not elsewhere classified     Problem List Patient Active Problem List   Diagnosis Date Noted  . Malignant neoplasm of upper-outer quadrant of left breast in female, estrogen receptor positive ([place]) 08/11/2017  . Microscopic colitis 08/31/2014  . Family hx of colon cancer 10/27/2013  . Essential hypertension 10/27/2013  . High cholesterol 10/27/2013  . Breast cancer ([place]) 10/27/2013  . Postop Acute blood loss anemia 11/04/2011  . OA (osteoarthritis) of knee 11/03/2011   Ihor Austin, LPTA; CBIS [phone]  Aldona Lento 08/26/2017, 9:56 AM  [place], [place], [address] Phone: [phone]   Fax:  [phone]  Name: HAEVYN URY MRN: [id number] Date of Birth: 05/27/1945

## 2017-08-28 ENCOUNTER — Ambulatory Visit: Payer: Medicare Other

## 2017-08-28 ENCOUNTER — Ambulatory Visit
Admission: RE | Admit: 2017-08-28 | Discharge: 2017-08-28 | Disposition: A | Discharge status: Home / Self Care | Payer: Medicare Other | Source: Ambulatory Visit | Attending: General Surgery | Admitting: General Surgery

## 2017-08-28 ENCOUNTER — Other Ambulatory Visit: Payer: Self-pay | Admitting: General Surgery

## 2017-08-28 DIAGNOSIS — C50912 Malignant neoplasm of unspecified site of left female breast: Secondary | ICD-10-CM | POA: Diagnosis not present

## 2017-08-28 MED ORDER — GADOBENATE DIMEGLUMINE 529 MG/ML IV SOLN
20.0000 mL | Freq: Once | INTRAVENOUS | Status: AC | PRN
  Administered 2017-08-28: 20 mL via INTRAVENOUS

## 2017-09-02 NOTE — Pre-Procedure Instructions (Signed)
Sylvia Barnett  09/02/2017      [id number] [place] [place] [address] [place] [address] Phone: [phone] Fax: [phone]  [facility] [place], [place] [address] Phone: [phone] Fax: [phone]    Your procedure is scheduled on September 11, 2017.  Report to [facility] Admitting at 830 AM.  Call this number if you have problems the morning of surgery:  [phone]   Remember:  Do not eat after midnight.  You may drink clear liquids until 730 AM-3 hours prior to procedure start tim .  Clear liquids allowed are  Water, Juice (non-citric and without pulp), Carbonated beverages, Clear Tea, Black Coffee only, Plain Jell-O only, Gatorade and Plain Popsicles only    Take these medicines the morning of surgery with A SIP OF WATER  Cyclobenzaprine (flexeril) Dicyclomine (bentyl) escitalopram (lexapro) ISOPTO eye drops Loperamide (imodium)  7 days prior to surgery STOP taking any meloxicam (mobic), Aspirin (unless otherwise instructed by your surgeon), Aleve, Naproxen, Ibuprofen, Motrin, Advil, Goody's, BC's, all herbal medications, fish oil, and all vitamins   Do not wear jewelry, make-up or nail polish.  Do not wear lotions, powders, or perfumes, or deodorant.  Do not shave 48 hours prior to surgery.    Do not bring valuables to the hospital.  [facility] is not responsible for any belongings or valuables.  Contacts, dentures or bridgework may not be worn into surgery.  Leave your suitcase in the car.  After surgery it may be brought to your room.  For patients admitted to the hospital, discharge time will be determined by your treatment team.  Patients discharged the day of surgery will not be allowed to drive home.    [facility]- Preparing For Surgery  Before surgery, you can play an important role.  Because skin is not sterile, your skin needs to be as free of germs as possible. You can reduce the number of germs on your skin by washing with CHG (chlorahexidine gluconate) Soap before surgery.  CHG is an antiseptic cleaner which kills germs and bonds with the skin to continue killing germs even after washing.    Oral Hygiene is also important to reduce your risk of infection.  Remember - BRUSH YOUR TEETH THE MORNING OF SURGERY WITH YOUR REGULAR TOOTHPASTE  Please do not use if you have an allergy to CHG or antibacterial soaps. If your skin becomes reddened/irritated stop using the CHG.  Do not shave (including legs and underarms) for at least 48 hours prior to first CHG shower. It is OK to shave your face.  Please follow these instructions carefully.   1. Shower the NIGHT BEFORE SURGERY and the MORNING OF SURGERY with CHG.   2. If you chose to wash your hair, wash your hair first as usual with your normal shampoo.  3. After you shampoo, rinse your hair and body thoroughly to remove the shampoo.  4. Use CHG as you would any other liquid soap. You can apply CHG directly to the skin and wash gently with a scrungie or a clean washcloth.   5. Apply the CHG Soap to your body ONLY FROM THE NECK DOWN.  Do not use on open wounds or open sores. Avoid contact with your eyes, ears, mouth and genitals (private parts). Wash Face and genitals (private parts)  with your normal soap.  6. Wash thoroughly, paying special attention to the area where your surgery will be performed.  7. Thoroughly rinse your body with warm water from the neck down.  8. DO NOT shower/wash with your normal soap after using and rinsing off the CHG Soap.  9. Pat yourself dry with a CLEAN TOWEL.  10. Wear CLEAN PAJAMAS to bed the night before surgery, wear comfortable clothes the morning of surgery  11. Place CLEAN SHEETS on your bed the night of your first shower and DO NOT SLEEP WITH PETS.  Day of Surgery:  Do not apply  any deodorants/lotions.  Please wear clean clothes to the hospital/surgery center.   Remember to brush your teeth WITH YOUR REGULAR TOOTHPASTE.  Please read over the following fact sheets that you were given.

## 2017-09-03 ENCOUNTER — Other Ambulatory Visit: Payer: Self-pay

## 2017-09-03 ENCOUNTER — Encounter (HOSPITAL_COMMUNITY): Payer: Self-pay

## 2017-09-03 ENCOUNTER — Encounter (HOSPITAL_COMMUNITY)
Admission: RE | Admit: 2017-09-03 | Discharge: 2017-09-03 | Disposition: A | Discharge status: Home / Self Care | Payer: Medicare Other | Source: Ambulatory Visit | Attending: General Surgery | Admitting: General Surgery

## 2017-09-03 DIAGNOSIS — Z01812 Encounter for preprocedural laboratory examination: Secondary | ICD-10-CM | POA: Insufficient documentation

## 2017-09-03 DIAGNOSIS — C50912 Malignant neoplasm of unspecified site of left female breast: Secondary | ICD-10-CM | POA: Insufficient documentation

## 2017-09-03 LAB — CBC
HCT: 44 % (ref 36.0–46.0)
Hemoglobin: 13.9 g/dL (ref 12.0–15.0)
MCH: 30.4 pg (ref 26.0–34.0)
MCHC: 31.6 g/dL (ref 30.0–36.0)
MCV: 96.3 fL (ref 78.0–100.0)
PLATELETS: 161 10*3/uL (ref 150–400)
RBC: 4.57 MIL/uL (ref 3.87–5.11)
RDW: 12.7 % (ref 11.5–15.5)
WBC: 5.6 10*3/uL (ref 4.0–10.5)

## 2017-09-03 LAB — BASIC METABOLIC PANEL
Anion gap: 6 (ref 5–15)
BUN: 24 mg/dL — ABNORMAL HIGH (ref 8–23)
CALCIUM: 9.2 mg/dL (ref 8.9–10.3)
CHLORIDE: 109 mmol/L (ref 98–111)
CO2: 26 mmol/L (ref 22–32)
CREATININE: 0.7 mg/dL (ref 0.44–1.00)
GFR calc Af Amer: >60 mL/min (ref >60)
GFR calc non Af Amer: >60 mL/min (ref >60)
GLUCOSE: 105 mg/dL — AB (ref 70–99)
Potassium: 3.5 mmol/L (ref 3.5–5.1)
Sodium: 141 mmol/L (ref 135–145)

## 2017-09-03 NOTE — Progress Notes (Signed)
PCP: Consuello Masse, MD  Cardiologist: pt denies  EKG: denies past year, obtained today  Stress test: pt denies  ECHO: pt denies  Cardiac Cath: pt denies  Chest x-ray: pt denies past year, no recent respiratory infections/complications

## 2017-09-08 ENCOUNTER — Encounter (HOSPITAL_COMMUNITY): Payer: Self-pay

## 2017-09-08 ENCOUNTER — Ambulatory Visit (HOSPITAL_COMMUNITY): Payer: Medicare Other

## 2017-09-08 DIAGNOSIS — M25612 Stiffness of left shoulder, not elsewhere classified: Secondary | ICD-10-CM | POA: Diagnosis not present

## 2017-09-08 DIAGNOSIS — C50412 Malignant neoplasm of upper-outer quadrant of left female breast: Secondary | ICD-10-CM | POA: Diagnosis not present

## 2017-09-08 DIAGNOSIS — M79602 Pain in left arm: Secondary | ICD-10-CM

## 2017-09-08 DIAGNOSIS — R293 Abnormal posture: Secondary | ICD-10-CM

## 2017-09-08 DIAGNOSIS — Z170 Estrogen receptor positive status [ER+]: Principal | ICD-10-CM

## 2017-09-08 NOTE — Therapy (Signed)
[place], [place], [address] Phone: [phone]   Fax:  [phone]  Physical Therapy Treatment  Patient Details  Name: Sylvia Barnett MRN: [id number] Date of Birth: 21-May-1945 Referring Provider: Dr. Excell Seltzer   Encounter Date: 09/08/2017  PT End of Session - 09/08/17 1056    Visit Number  6    Number of Visits  8    Date for PT Re-Evaluation  09/09/17    Authorization Type  Medicare A & B    Authorization Time Period  7/31--. 09/09/17    PT Start Time  1032    PT Stop Time  1112    PT Time Calculation (min)  40 min    Activity Tolerance  Patient tolerated treatment well    Behavior During Therapy  [facility] for tasks assessed/performed       Past Medical History:  Diagnosis Date  . Anxiety   . Arthritis    Right hip  . Cancer [facility]) 2004    Breast Lumpectomy  . Complication of anesthesia    Has a small throat  . Depression   . Hypercholesteremia   . Hypertension   . IBS (irritable bowel syndrome)   . Incontinence     Past Surgical History:  Procedure Laterality Date  . ABDOMINAL HYSTERECTOMY  2002  . APPENDECTOMY  1949  . BIOPSY BREAST  2010   Left  . Bladder Tack  2002  . BREAST LUMPECTOMY  2004   Left  . BREAST LUMPECTOMY  2004   Left  . BREAST LUMPECTOMY  2006   Left  . BREAST LUMPECTOMY  2000   Left  . BUNIONECTOMY  1994   Right  . CATARACT EXTRACTION W/PHACO Left 08/18/2013   Procedure: CATARACT EXTRACTION PHACO AND INTRAOCULAR LENS PLACEMENT (IOC);  Surgeon: Tonny Branch, MD;  Location: [facility];  Service: Ophthalmology;  Laterality: Left;  CDE: 8.62  . COLONOSCOPY N/A 11/10/2013   Procedure: COLONOSCOPY;  Surgeon: Rogene Houston, MD;  [facility];  Service: Endoscopy;  Laterality: N/A;  240  . EYE SURGERY  2011   Catarct Right  . EYE SURGERY  08-2011   Right Yag procedure  . JOINT REPLACEMENT  08-2010   Left total knee  . KNEE ARTHROSCOPY  2010   Right  . KNEE  ARTHROSCOPY  2003   Left  . TOTAL KNEE ARTHROPLASTY  11/03/2011   Procedure: TOTAL KNEE ARTHROPLASTY;  Surgeon: Gearlean Alf, MD;  Location[facility];  Service: Orthopedics;  Laterality: Right;  . TUBAL LIGATION    . WRIST SURGERY Bilateral 03-2010  . WRIST SURGERY  2008   Right    There were no vitals filed for this visit.  Subjective Assessment - 09/08/17 1036    Subjective  Pt stated she went on vacation and used her Lt arm a lot, increased pain today.  Hasnt taken any pain medication for a week per surgery scheduled this Friday.  Does have pain patch on Lt shoulder for pain control.    Pertinent History  Patient was diagnosed on 07/29/17 with left grade II invasive lobular carcinoma breast cancer. It measures 1.1 cm and is located in the lower outer quadrant. It is ER positive, PR negative, and HER2 positive with a Ki67 of 1%. She has a previous left breast LCIS in 2000 and had a lumpectomy for that. She has had both knee replaced: right 2012 and left 2013. She fractured her left  elbow in 07/2016 when she fell playing kickball with her grandchild.    Patient Stated Goals  Decrease left arm pain and learn post op shoulder ROM HEP    Currently in Pain?  Yes    Pain Score  10-Worst pain ever    Pain Location  Shoulder    Pain Orientation  Left    Pain Descriptors / Indicators  Tightness;Sore;Aching;Tender    Pain Type  Chronic pain    Pain Onset  More than a month ago    Aggravating Factors   lifting, reaching, carrying    Pain Relieving Factors  mobic                       [facility] Adult PT Treatment/Exercise - 09/08/17 0001      Exercises   Exercises  Shoulder      Shoulder Exercises: Supine   Flexion  Both;10 reps;Limitations    Flexion Limitations  AROM 10x 10" holds for endurace      Shoulder Exercises: Seated   Retraction  Both;15 reps    Theraband Level (Shoulder Retraction)  Level 2 (Red)    Retraction Limitations  verbal/tactile cuingneeded; reinforcement  needed      Shoulder Exercises: Standing   Other Standing Exercises  wall arch 5x    Other Standing Exercises  UE flexion with back on wall      Shoulder Exercises: Pulleys   Flexion  2 minutes      Shoulder Exercises: Therapy Ball   Flexion  Both;10 reps    ABduction  Left;10 reps    Right/Left  5 reps      Manual Therapy   Manual Therapy  Passive ROM;Soft tissue mobilization    Manual therapy comments  completed at end of session    Soft tissue mobilization  subscap, terees major/minor, subscap, biceps and anterior/medial deltoid    Passive ROM  supine for Lt shoulder flexion and abduction and IR                  PT Long Term Goals - 08/25/17 1218      PT LONG TERM GOAL #1   Title  Patient will be independent with her HEP for increasing shoulder ROM.    Time  4    Period  Weeks    Status  On-going      PT LONG TERM GOAL #2   Title  Patient will increase left shoulder active flexion to >/= 130 degrees for increased ease reaching overhead.    Time  4    Period  Weeks    Status  On-going      PT LONG TERM GOAL #3   Title  Patient will increase left shoulder active abduction to >/= 130 degrees for increased ease obtaining radiation positioning.    Time  4    Period  Weeks    Status  On-going      PT LONG TERM GOAL #4   Title  Patient will report >/= 40% reduction in left arm pain to tolerate performing normal daily tasks.    Time  4    Period  Weeks    Status  On-going            Plan - 09/08/17 1113    Clinical Impression Statement  Pt limited by increased pain with reports of holding pain medication this week prior surgery on Friday.  Increased time with manual soft tissue mobilization and PROM  for pain control and shoulder mobility.  Most painful movements with flexion this session.  Added standing wall arches and resistance with scapular retraction for postural strengthening.  EOS pt reports vast improvements with pain reduced to 5/10.      Rehab  Potential  Excellent    Clinical Impairments Affecting Rehab Potential  Active cancer    PT Frequency  2x / week    PT Duration  4 weeks    PT Treatment/Interventions  ADLs/Self Care Home Management;Therapeutic activities;Therapeutic exercise;Manual techniques;Passive range of motion;Dry needling;Patient/family education;Iontophoresis 63m/ml Dexamethasone;Moist Heat    PT Next Visit Plan  Reassess next session.      PT Home Exercise Plan  POst op shoulder ROM HEP for after breast surgery       Patient will benefit from skilled therapeutic intervention in order to improve the following deficits and impairments:  Decreased range of motion, Pain, Impaired UE functional use, Decreased strength, Decreased knowledge of precautions  Visit Diagnosis: Malignant neoplasm of upper-outer quadrant of left breast in female, estrogen receptor positive (HCC)  Abnormal posture  Pain in left arm  Stiffness of left shoulder, not elsewhere classified     Problem List Patient Active Problem List   Diagnosis Date Noted  . Malignant neoplasm of upper-outer quadrant of left breast in female, estrogen receptor positive ([place] 08/11/2017  . Microscopic colitis 08/31/2014  . Family hx of colon cancer 10/27/2013  . Essential hypertension 10/27/2013  . High cholesterol 10/27/2013  . Breast cancer ([place] 10/27/2013  . Postop Acute blood loss anemia 11/04/2011  . OA (osteoarthritis) of knee 11/03/2011   CIhor Austin LPTA; CBIS [phone] CAldona Lento8/27/2019, 11:19 AM  [address] [address]Phone: [phone]  Fax:  [phone] Name: Sylvia TARPLEYMRN: [id number] of Birth: [birth year]

## 2017-09-09 ENCOUNTER — Encounter (HOSPITAL_COMMUNITY): Payer: Self-pay

## 2017-09-09 ENCOUNTER — Ambulatory Visit (HOSPITAL_COMMUNITY): Payer: Medicare Other

## 2017-09-09 ENCOUNTER — Other Ambulatory Visit: Payer: Self-pay

## 2017-09-09 DIAGNOSIS — M25612 Stiffness of left shoulder, not elsewhere classified: Secondary | ICD-10-CM

## 2017-09-09 DIAGNOSIS — M79602 Pain in left arm: Secondary | ICD-10-CM | POA: Diagnosis not present

## 2017-09-09 DIAGNOSIS — C50412 Malignant neoplasm of upper-outer quadrant of left female breast: Secondary | ICD-10-CM

## 2017-09-09 DIAGNOSIS — Z170 Estrogen receptor positive status [ER+]: Secondary | ICD-10-CM | POA: Diagnosis not present

## 2017-09-09 DIAGNOSIS — R293 Abnormal posture: Secondary | ICD-10-CM | POA: Diagnosis not present

## 2017-09-09 NOTE — Therapy (Signed)
[place], [place], [address] Phone: [phone]   Fax:  [phone]  Physical Therapy Treatment/Discharge Summary  Patient Details  Name: Sylvia Barnett MRN: [id number] Date of Birth: October 07, 1945 Referring Provider: Dr. Excell Seltzer   Encounter Date: 09/09/2017   PHYSICAL THERAPY DISCHARGE SUMMARY  Visits from Start of Care: 7  Current functional level related to goals / functional outcomes: Re-assessment performed today and patient's Lt shoulder ROM has improved for flexion however not for shoulder abduction. Abduction remains painful and limited around 95 degrees this date. She has demonstrated compliance with HEP and was educated on additional exercises as she heals following surgery for left breast lumpectomy on Friday. She was provided a second handout on attending post op breast cancer class in [place] and was encouraged to participate in this. She was educated on the benefit of physical therapy following surgery to address ROM deficits. She will be discharged today as there will be a change in medical status follow surgery Friday 09/11/17 and was educated to return to physical therapy as needed to address any mobility deficits she experiences post-op.   Remaining deficits: See below details    Education / Equipment: Discussed progress with ROM towards goals and continued limitation. Reviewed and provided handout on information to attend post op breast cancer class in [place] and that encouraged patient to participate in this. Provided additional exercises for HEP to improve Lt shoulder mobility  Plan: Patient agrees to discharge.  Patient goals were partially met. Patient is being discharged due to a change in medical status.  Patient is having surgery this Friday 09/11/17 for Left Breast Cancer.?????       PT End of Session - 09/09/17 1057    Visit Number  7    Number of Visits  8    Date for  PT Re-Evaluation  09/09/17    Authorization Type  Medicare A & B    Authorization Time Period  7/31--. 09/09/17    PT Start Time  1046   patient arrived late   PT Stop Time  1113    PT Time Calculation (min)  27 min    Activity Tolerance  Patient tolerated treatment well    Behavior During Therapy  WFL for tasks assessed/performed       Past Medical History:  Diagnosis Date  . Anxiety   . Arthritis    Right hip  . Cancer [facility]) 2004    Breast Lumpectomy  . Complication of anesthesia    Has a small throat  . Depression   . Hypercholesteremia   . Hypertension   . IBS (irritable bowel syndrome)   . Incontinence     Past Surgical History:  Procedure Laterality Date  . ABDOMINAL HYSTERECTOMY  2002  . APPENDECTOMY  1949  . BIOPSY BREAST  2010   Left  . Bladder Tack  2002  . BREAST LUMPECTOMY  2004   Left  . BREAST LUMPECTOMY  2004   Left  . BREAST LUMPECTOMY  2006   Left  . BREAST LUMPECTOMY  2000   Left  . BUNIONECTOMY  1994   Right  . CATARACT EXTRACTION W/PHACO Left 08/18/2013   Procedure: CATARACT EXTRACTION PHACO AND INTRAOCULAR LENS PLACEMENT (IOC);  Surgeon: Tonny Branch, MD;  Location: [facility];  Service: Ophthalmology;  Laterality: Left;  CDE: 8.62  . COLONOSCOPY N/A 11/10/2013   Procedure: COLONOSCOPY;  Surgeon: Rogene Houston, MD;  Location: [facility] ENDO SUITE;  Service: Endoscopy;  Laterality: N/A;  240  . EYE SURGERY  2011   Catarct Right  . EYE SURGERY  08-2011   Right Yag procedure  . JOINT REPLACEMENT  08-2010   Left total knee  . KNEE ARTHROSCOPY  2010   Right  . KNEE ARTHROSCOPY  2003   Left  . TOTAL KNEE ARTHROPLASTY  11/03/2011   Procedure: TOTAL KNEE ARTHROPLASTY;  Surgeon: Gearlean Alf, MD;  Location: [facility];  Service: Orthopedics;  Laterality: Right;  . TUBAL LIGATION    . WRIST SURGERY Bilateral 03-2010  . WRIST SURGERY  2008   Right    There were no vitals filed for this visit.  Subjective Assessment - 09/09/17 1049    Subjective  Patient  reports her Lt arm is feeling a little bit better since her last session but is still having pain. She reports she does her exercises every day.     Pertinent History  Patient was diagnosed on 07/29/17 with left grade II invasive lobular carcinoma breast cancer. It measures 1.1 cm and is located in the lower outer quadrant. It is ER positive, PR negative, and HER2 positive with a Ki67 of 1%. She has a previous left breast LCIS in 2000 and had a lumpectomy for that. She has had both knee replaced: right 2012 and left 2013. She fractured her left elbow in 07/2016 when she fell playing kickball with her grandchild.    Patient Stated Goals  Decrease left arm pain and learn post op shoulder ROM HEP    Currently in Pain?  Yes   when moving   Pain Score  5     Pain Location  Shoulder    Pain Orientation  Left    Pain Descriptors / Indicators  Aching;Tightness;Sore    Pain Type  Chronic pain    Pain Onset  More than a month ago    Pain Frequency  Intermittent    Aggravating Factors   moving arm, lifting, reaching    Pain Relieving Factors  medicine         [facility] PT Assessment - 09/09/17 0001      Assessment   Medical Diagnosis  Left breast cancer; left arm pain    Referring Provider  Dr. Excell Seltzer    Onset Date/Surgical Date  07/29/17    Hand Dominance  Right    Prior Therapy  None      Precautions   Precautions  Other (comment)    Precaution Comments  active cancer; left arm lymphedema risk after breast surgery      Restrictions   Weight Bearing Restrictions  No      Balance Screen   Has the patient fallen in the past 6 months  No    Has the patient had a decrease in activity level because of a fear of falling?   No    Is the patient reluctant to leave their home because of a fear of falling?   No      Prior Function   Level of Independence  Independent      Cognition   Overall Cognitive Status  Within Functional Limits for tasks assessed      Posture/Postural Control    Postural Limitations  Rounded Shoulders;Forward head      AROM   Left Shoulder Extension  43 Degrees   was 46   Left Shoulder Flexion  138 Degrees   was 90   Left Shoulder ABduction  95 Degrees  was 109   Left Shoulder Internal Rotation  55 Degrees   was 53   Left Shoulder External Rotation  73 Degrees   was 75      [facility] Adult PT Treatment/Exercise - 09/09/17 0001      Exercises   Exercises  Shoulder      Shoulder Exercises: Supine   External Rotation  AAROM;10 reps;Limitations    External Rotation Limitations  2 sets, with dowel    ABduction  Left;10 reps;AAROM    ABduction Limitations  2 sets, with dowel      Shoulder Exercises: Sidelying   External Rotation  AROM;Left;10 reps;Limitations   2 sets   External Rotation Limitations  towel under elbow      Shoulder Exercises: Standing   Other Standing Exercises  Wall slides for flexion, 2x 10 reps    Other Standing Exercises  Wall angels for abduction wtih back to wall; 1x 10 reps        PT Education - 09/09/17 1304    Education Details  Discussed progress with ROM towards goals and continued limitation. Reviewed and provided handout on information to attend post op breast cancer class in [place] and that encouraged patient to participate in this. Provided additional exercises for HEP to improve Lt shoulder mobility.    Person(s) Educated  Patient    Methods  Explanation;Handout    Comprehension  Verbalized understanding;Returned demonstration        PT Long Term Goals - 09/09/17 1049      PT LONG TERM GOAL #1   Title  Patient will be independent with her HEP for increasing shoulder ROM.    Time  4    Period  Weeks    Status  Achieved      PT LONG TERM GOAL #2   Title  Patient will increase left shoulder active flexion to >/= 130 degrees for increased ease reaching overhead.    Time  4    Period  Weeks    Status  Achieved      PT LONG TERM GOAL #3   Title  Patient will increase left shoulder active  abduction to >/= 130 degrees for increased ease obtaining radiation positioning.    Time  4    Period  Weeks    Status  Not Met      PT LONG TERM GOAL #4   Title  Patient will report >/= 40% reduction in left arm pain to tolerate performing normal daily tasks.    Baseline  50%    Time  4    Period  Weeks    Status  Achieved         Plan - 09/09/17 1254    Clinical Impression Statement  Re-assessment performed today and patient's Lt shoulder ROM has improved for flexion however not for shoulder abduction. Abduction remains painful and limited around 95 degrees this date. She has demonstrated compliance with HEP and was educated on additional exercises as she heals following surgery for left breast lumpectomy on Friday. She was provided a second handout on attending post op breast cancer class in [place] and was encouraged to participate in this. She was educated on the benefit of physical therapy following surgery to address ROM deficits. She will be discharged today as there will be a change in medical status follow surgery Friday 09/11/17 and was educated to return to physical therapy as needed to address any mobility deficits she experiences post-op.    Rehab Potential  Excellent    Clinical Impairments Affecting Rehab Potential  Active cancer    PT Frequency  2x / week    PT Duration  4 weeks    PT Treatment/Interventions  ADLs/Self Care Home Management;Therapeutic activities;Therapeutic exercise;Manual techniques;Passive range of motion;Dry needling;Patient/family education;Iontophoresis 59m/ml Dexamethasone;Moist Heat    PT Next Visit Plan  Discharging as patient has upcoming surgery for Left breast cancer    PT Home Exercise Plan  POst op shoulder ROM HEP for after breast surgery    Consulted and Agree with Plan of Care  Patient       Patient will benefit from skilled therapeutic intervention in order to improve the following deficits and impairments:  Decreased range of motion,  Pain, Impaired UE functional use, Decreased strength, Decreased knowledge of precautions  Visit Diagnosis: Malignant neoplasm of upper-outer quadrant of left breast in female, estrogen receptor positive (HCC)  Abnormal posture  Pain in left arm  Stiffness of left shoulder, not elsewhere classified     Problem List Patient Active Problem List   Diagnosis Date Noted  . Malignant neoplasm of upper-outer quadrant of left breast in female, estrogen receptor positive ([place] 08/11/2017  . Microscopic colitis 08/31/2014  . Family hx of colon cancer 10/27/2013  . Essential hypertension 10/27/2013  . High cholesterol 10/27/2013  . Breast cancer ([place] 10/27/2013  . Postop Acute blood loss anemia 11/04/2011  . OA (osteoarthritis) of knee 11/03/2011    RKipp Brood PT, DPT Physical Therapist [facility] 09/09/2017 4:33 PM    [address] [address]Phone: [phone]  Fax:  [phone] Name: Sylvia HICKLINGMRN: [id number] of Birth: 603-17-1947

## 2017-09-09 NOTE — Patient Instructions (Signed)

## 2017-09-10 ENCOUNTER — Ambulatory Visit
Admission: RE | Admit: 2017-09-10 | Discharge: 2017-09-10 | Disposition: A | Discharge status: Home / Self Care | Payer: Medicare Other | Source: Ambulatory Visit | Attending: General Surgery | Admitting: General Surgery

## 2017-09-10 DIAGNOSIS — C50912 Malignant neoplasm of unspecified site of left female breast: Secondary | ICD-10-CM

## 2017-09-10 DIAGNOSIS — C50412 Malignant neoplasm of upper-outer quadrant of left female breast: Secondary | ICD-10-CM | POA: Diagnosis not present

## 2017-09-10 NOTE — H&P (Signed)
History of Present Illness  The patient is a 72 year old female who presents with breast cancer. She is a post menopausal female referred by Dr. Dorise Bullion for evaluation of recently diagnosed carcinoma of the left breast. She recently presented for a screening mamogram revealing new calcifications in the left breast.. Subsequent imaging included diagnostic mamogram showing a 1.1 cm area of calcifications in the upper outer left breast and an additional area of faint calcifications in the lower central left breast measuring 3.7 cm.. A stereotactic breast biopsy was performed on 08/05/2017 with pathology revealing invasive lobular carcinoma of the breast in the smaller area of calcifications in the upper outer quadrant, marked with a coil-shaped clip. This was associated with LCIS and CSL. The larger more faint calcifications returned as benign which was felt to be possibly discordant. She is seen now in [facility] for initial treatment planning. She has experienced no breast symptoms, specifically lump, nipple discharge or skin changes. She has a previous history of left breast lumpectomy around 2000 for LCISand subsequently had a large core needle biopsy in 2010 revealing LCIS that was not excised. Preoperative bilateral breast MRI was obtained.  The initial MRI confirmed the 2 known areas with enhancement but additionally there was non-mass enhancement extending posteriorly for almost 4 cm from the known malignancy.  MR guided biopsy of the area was recommended but on follow-up MRI for the biopsy the enhancement was no longer demonstrated in the biopsy was not performed.  Findings at that time were the following: Tumor size: 1.1 cm Tumor grade: 2 Estrogen Receptor: 90% positive Progesterone Receptor: negative Her-2 neu: positive Lymph node status: negative    Past Surgical History  Appendectomy  Breast Biopsy  Left. Breast Mass; Local Excision  Left. Gallbladder Surgery - Laparoscopic   Hysterectomy (not due to cancer) - Complete  Knee Surgery  Bilateral.  Diagnostic Studies History  Colonoscopy  1-5 years ago Pap Smear  1-5 years ago  Medication History  Medications Reconciled  Social History No alcohol use  No caffeine use  No drug use  Tobacco use  Never smoker.  Family History  Heart Disease  Father. Melanoma  Brother.  Pregnancy / Birth History  Age at menarche  61 years. Gravida  2 Maternal age  60-35 Para  2  Other Problems  Anxiety Disorder  Arthritis  Cholelithiasis  Cirrhosis Of Liver  Depression  General anesthesia - complications  High blood pressure  Hypercholesterolemia  Melanoma     Review of Systems  General Not Present- Appetite Loss, Chills, Fatigue, Fever, Night Sweats, Weight Gain and Weight Loss. Skin Not Present- Change in Wart/Mole, Dryness, Hives, Jaundice, New Lesions, Non-Healing Wounds, Rash and Ulcer. HEENT Not Present- Earache, Hearing Loss, Hoarseness, Nose Bleed, Oral Ulcers, Ringing in the Ears, Seasonal Allergies, Sinus Pain, Sore Throat, Visual Disturbances, Wears glasses/contact lenses and Yellow Eyes. Respiratory Not Present- Bloody sputum, Chronic Cough, Difficulty Breathing, Snoring and Wheezing. Breast Not Present- Breast Mass, Breast Pain, Nipple Discharge and Skin Changes. Cardiovascular Not Present- Chest Pain, Difficulty Breathing Lying Down, Leg Cramps, Palpitations, Rapid Heart Rate, Shortness of Breath and Swelling of Extremities. Gastrointestinal Not Present- Abdominal Pain, Bloating, Bloody Stool, Change in Bowel Habits, Chronic diarrhea, Constipation, Difficulty Swallowing, Excessive gas, Gets full quickly at meals, Hemorrhoids, Indigestion, Nausea, Rectal Pain and Vomiting. Female Genitourinary Not Present- Frequency, Nocturia, Painful Urination, Pelvic Pain and Urgency. Musculoskeletal Present- Joint Pain and Muscle Pain. Not Present- Back Pain, Joint Stiffness, Muscle  Weakness and Swelling of  Extremities. Neurological Not Present- Decreased Memory, Fainting, Headaches, Numbness, Seizures, Tingling, Tremor, Trouble walking and Weakness. Psychiatric Not Present- Anxiety, Bipolar, Change in Sleep Pattern, Depression, Fearful and Frequent crying. Endocrine Present- Hot flashes. Not Present- Cold Intolerance, Excessive Hunger, Hair Changes, Heat Intolerance and New Diabetes. Hematology Not Present- Blood Thinners, Easy Bruising, Excessive bleeding, Gland problems, HIV and Persistent Infections.   Physical Exam  The physical exam findings are as follows: Note:General: Alert, well-developed and well nourished older Caucasian female, in no distress Skin: Warm and dry without rash or infection. HEENT: No palpable masses or thyromegaly. Sclera nonicteric. Pupils equal round and reactive. Lymph nodes: No cervical, supraclavicular, nodes palpable. Breasts: Some bruising left breast post biopsy. Healed upper incision. I cannot feel or skin crusting. No palpable axillary adenopathy.r skin crusting. No palpable axillary adenopathy. Lungs: Breath sounds clear and equal. No wheezing or increased work of breathing. Cardiovascular: Regular rate and rhythm without murmer. No JVD or edema. Abdomen: Nondistended. Soft and nontender. No masses palpable. No organomegaly. No palpable hernias. Extremities: No edema or joint swelling or deformity. No chronic venous stasis changes. Neurologic: Alert and fully oriented. Gait normal. No focal weakness. Psychiatric: Normal mood and affect. Thought content appropriate with normal judgement and insight    Assessment & Plan  INVASIVE LOBULAR CARCINOMA OF LEFT BREAST, STAGE 1 (C50.912) Impression: 72 year old female with a new diagnosis of cancer of the left breast, pper outer quadrant. Clinical stage !A, ER positive, PR negative, HER-2 positive. in addiations 3.7 cma second lower area of faint calcifications 3.7 cm biopsy negative but  potentially discordant. I discussed with the patient and her husband today initial surgical treatment options. We discussed options of breast conservation with lumpectomy or total mastectomy and sentinal lymph node biopsy/dissection. Options for reconstruction were discussed. After discussion they have elected to proceed with breast conservation. we discussed that she is going to need 2 separate excisions which could certainly result in some breast shrinkage or deformity particularly after radiation. We discussed I would not try to get wide margins on the potentially benign area. We also discussed Port-A-Cath placement as she will requirechemotherapy or at least Herceptin postoperatively. we discussed preoperative MRI to further assess extent of disease with her lobular cancer. We discussed the indications and nature of the procedure, and expected recovery, in detail. Surgical risks including anesthetic complications, cardiorespiratory complications, bleeding, infection, wound healing complications, blood clots, lymphedema, local and distant recurrence and possible need for further surgery based on the final pathology was discussed and understood. Chemotherapy, hormonal therapy and radiation therapy have been discussed. They have been provided with literature regarding the treatment of breast cancer. All questions were answered. They understand and agree to proceed and we will go ahead with scheduling.  Current Plans Radioactive seed localized lumpectomy left breast X 2 and placement of Port-A-Cath.

## 2017-09-11 ENCOUNTER — Encounter (HOSPITAL_COMMUNITY): Payer: Self-pay | Admitting: *Deleted

## 2017-09-11 ENCOUNTER — Encounter (HOSPITAL_COMMUNITY)
Admission: RE | Disposition: A | Discharge status: Home / Self Care | Payer: Self-pay | Source: Ambulatory Visit | Attending: General Surgery

## 2017-09-11 ENCOUNTER — Ambulatory Visit (HOSPITAL_COMMUNITY)
Admission: RE | Admit: 2017-09-11 | Discharge: 2017-09-11 | Disposition: A | Discharge status: Home / Self Care | Payer: Medicare Other | Source: Ambulatory Visit | Attending: General Surgery | Admitting: General Surgery

## 2017-09-11 ENCOUNTER — Ambulatory Visit (HOSPITAL_COMMUNITY): Payer: Medicare Other | Admitting: Certified Registered Nurse Anesthetist

## 2017-09-11 ENCOUNTER — Ambulatory Visit (HOSPITAL_COMMUNITY): Payer: Medicare Other

## 2017-09-11 ENCOUNTER — Ambulatory Visit
Admission: RE | Admit: 2017-09-11 | Discharge: 2017-09-11 | Disposition: A | Discharge status: Home / Self Care | Payer: Medicare Other | Source: Ambulatory Visit | Attending: General Surgery | Admitting: General Surgery

## 2017-09-11 DIAGNOSIS — I10 Essential (primary) hypertension: Secondary | ICD-10-CM | POA: Insufficient documentation

## 2017-09-11 DIAGNOSIS — G8918 Other acute postprocedural pain: Secondary | ICD-10-CM | POA: Diagnosis not present

## 2017-09-11 DIAGNOSIS — C50912 Malignant neoplasm of unspecified site of left female breast: Secondary | ICD-10-CM

## 2017-09-11 DIAGNOSIS — E7800 Pure hypercholesterolemia, unspecified: Secondary | ICD-10-CM | POA: Diagnosis not present

## 2017-09-11 DIAGNOSIS — K7460 Unspecified cirrhosis of liver: Secondary | ICD-10-CM | POA: Diagnosis not present

## 2017-09-11 DIAGNOSIS — F329 Major depressive disorder, single episode, unspecified: Secondary | ICD-10-CM | POA: Insufficient documentation

## 2017-09-11 DIAGNOSIS — Z79899 Other long term (current) drug therapy: Secondary | ICD-10-CM | POA: Insufficient documentation

## 2017-09-11 DIAGNOSIS — R928 Other abnormal and inconclusive findings on diagnostic imaging of breast: Secondary | ICD-10-CM | POA: Diagnosis not present

## 2017-09-11 DIAGNOSIS — Z170 Estrogen receptor positive status [ER+]: Secondary | ICD-10-CM | POA: Diagnosis not present

## 2017-09-11 DIAGNOSIS — C50412 Malignant neoplasm of upper-outer quadrant of left female breast: Secondary | ICD-10-CM | POA: Insufficient documentation

## 2017-09-11 DIAGNOSIS — K52839 Microscopic colitis, unspecified: Secondary | ICD-10-CM | POA: Diagnosis not present

## 2017-09-11 DIAGNOSIS — Z85820 Personal history of malignant melanoma of skin: Secondary | ICD-10-CM | POA: Insufficient documentation

## 2017-09-11 DIAGNOSIS — Z419 Encounter for procedure for purposes other than remedying health state, unspecified: Secondary | ICD-10-CM

## 2017-09-11 DIAGNOSIS — Z452 Encounter for adjustment and management of vascular access device: Secondary | ICD-10-CM | POA: Diagnosis not present

## 2017-09-11 DIAGNOSIS — Z95828 Presence of other vascular implants and grafts: Secondary | ICD-10-CM

## 2017-09-11 DIAGNOSIS — J811 Chronic pulmonary edema: Secondary | ICD-10-CM | POA: Diagnosis not present

## 2017-09-11 HISTORY — PX: BREAST LUMPECTOMY WITH RADIOACTIVE SEED AND SENTINEL LYMPH NODE BIOPSY: SHX6550

## 2017-09-11 HISTORY — PX: PORTACATH PLACEMENT: SHX2246

## 2017-09-11 SURGERY — BREAST LUMPECTOMY WITH RADIOACTIVE SEED AND SENTINEL LYMPH NODE BIOPSY
Anesthesia: General | Site: Breast

## 2017-09-11 MED ORDER — FENTANYL CITRATE (PF) 100 MCG/2ML IJ SOLN
INTRAMUSCULAR | Status: AC
  Administered 2017-09-11: 50 ug via INTRAVENOUS
  Filled 2017-09-11: qty 2

## 2017-09-11 MED ORDER — GLYCOPYRROLATE PF 0.2 MG/ML IJ SOSY
PREFILLED_SYRINGE | INTRAMUSCULAR | Status: AC
  Filled 2017-09-11: qty 1

## 2017-09-11 MED ORDER — ONDANSETRON HCL 4 MG/2ML IJ SOLN
INTRAMUSCULAR | Status: AC
  Filled 2017-09-11: qty 2

## 2017-09-11 MED ORDER — PROPOFOL 10 MG/ML IV BOLUS
INTRAVENOUS | Status: AC
  Filled 2017-09-11: qty 20

## 2017-09-11 MED ORDER — FENTANYL CITRATE (PF) 250 MCG/5ML IJ SOLN
INTRAMUSCULAR | Status: AC
  Filled 2017-09-11: qty 5

## 2017-09-11 MED ORDER — CHLORHEXIDINE GLUCONATE CLOTH 2 % EX PADS: 6.0000 | MEDICATED_PAD | Freq: Once | CUTANEOUS | Status: DC

## 2017-09-11 MED ORDER — FENTANYL CITRATE (PF) 250 MCG/5ML IJ SOLN
INTRAMUSCULAR | Status: DC | PRN
  Administered 2017-09-11 (×4): 25 ug via INTRAVENOUS

## 2017-09-11 MED ORDER — GLYCOPYRROLATE PF 0.2 MG/ML IJ SOSY
PREFILLED_SYRINGE | INTRAMUSCULAR | Status: DC | PRN
  Administered 2017-09-11 (×2): .2 mg via INTRAVENOUS

## 2017-09-11 MED ORDER — MIDAZOLAM HCL 2 MG/2ML IJ SOLN
INTRAMUSCULAR | Status: AC
  Administered 2017-09-11: 1 mg via INTRAVENOUS
  Filled 2017-09-11: qty 2

## 2017-09-11 MED ORDER — ONDANSETRON HCL 4 MG/2ML IJ SOLN: 4.0000 mg | Freq: Once | INTRAMUSCULAR | Status: DC | PRN

## 2017-09-11 MED ORDER — OXYCODONE HCL 5 MG PO TABS: 5.0000 mg | ORAL_TABLET | Freq: Four times a day (QID) | ORAL | 0 refills | Status: DC | PRN

## 2017-09-11 MED ORDER — HEPARIN SOD (PORK) LOCK FLUSH 100 UNIT/ML IV SOLN
INTRAVENOUS | Status: AC
  Filled 2017-09-11: qty 5

## 2017-09-11 MED ORDER — LIDOCAINE HCL (PF) 1 % IJ SOLN
INTRAMUSCULAR | Status: AC
  Filled 2017-09-11: qty 30

## 2017-09-11 MED ORDER — BUPIVACAINE-EPINEPHRINE (PF) 0.25% -1:200000 IJ SOLN
INTRAMUSCULAR | Status: AC
  Filled 2017-09-11: qty 30

## 2017-09-11 MED ORDER — DEXAMETHASONE SODIUM PHOSPHATE 10 MG/ML IJ SOLN
INTRAMUSCULAR | Status: DC | PRN
  Administered 2017-09-11: 5 mg via INTRAVENOUS

## 2017-09-11 MED ORDER — CEFAZOLIN SODIUM-DEXTROSE 2-4 GM/100ML-% IV SOLN
2.0000 g | INTRAVENOUS | Status: AC
  Administered 2017-09-11: 2 g via INTRAVENOUS
  Filled 2017-09-11: qty 100

## 2017-09-11 MED ORDER — 0.9 % SODIUM CHLORIDE (POUR BTL) OPTIME
TOPICAL | Status: DC | PRN
  Administered 2017-09-11: 1000 mL

## 2017-09-11 MED ORDER — EPHEDRINE SULFATE-NACL 50-0.9 MG/10ML-% IV SOSY
PREFILLED_SYRINGE | INTRAVENOUS | Status: DC | PRN
  Administered 2017-09-11: 10 mg via INTRAVENOUS

## 2017-09-11 MED ORDER — LIDOCAINE 2% (20 MG/ML) 5 ML SYRINGE
INTRAMUSCULAR | Status: AC
  Filled 2017-09-11: qty 5

## 2017-09-11 MED ORDER — DEXAMETHASONE SODIUM PHOSPHATE 10 MG/ML IJ SOLN
INTRAMUSCULAR | Status: AC
  Filled 2017-09-11: qty 1

## 2017-09-11 MED ORDER — SODIUM BICARBONATE 4 % IV SOLN
INTRAVENOUS | Status: AC
  Filled 2017-09-11: qty 5

## 2017-09-11 MED ORDER — LACTATED RINGERS IV SOLN
INTRAVENOUS | Status: DC | PRN
  Administered 2017-09-11 (×2): via INTRAVENOUS

## 2017-09-11 MED ORDER — METHYLENE BLUE 0.5 % INJ SOLN
INTRAVENOUS | Status: AC
  Filled 2017-09-11: qty 10

## 2017-09-11 MED ORDER — FENTANYL CITRATE (PF) 100 MCG/2ML IJ SOLN: 25.0000 ug | INTRAMUSCULAR | Status: DC | PRN

## 2017-09-11 MED ORDER — LIDOCAINE 2% (20 MG/ML) 5 ML SYRINGE
INTRAMUSCULAR | Status: DC | PRN
  Administered 2017-09-11: 100 mg via INTRAVENOUS

## 2017-09-11 MED ORDER — ACETAMINOPHEN 500 MG PO TABS
1000.0000 mg | ORAL_TABLET | ORAL | Status: AC
  Administered 2017-09-11: 1000 mg via ORAL
  Filled 2017-09-11: qty 2

## 2017-09-11 MED ORDER — SODIUM CHLORIDE 0.9 % IV SOLN
INTRAVENOUS | Status: AC
  Filled 2017-09-11: qty 1.2

## 2017-09-11 MED ORDER — EPHEDRINE 5 MG/ML INJ
INTRAVENOUS | Status: AC
  Filled 2017-09-11: qty 10

## 2017-09-11 MED ORDER — MIDAZOLAM HCL 2 MG/2ML IJ SOLN
1.0000 mg | Freq: Once | INTRAMUSCULAR | Status: AC
  Administered 2017-09-11: 1 mg via INTRAVENOUS

## 2017-09-11 MED ORDER — ONDANSETRON HCL 4 MG/2ML IJ SOLN
INTRAMUSCULAR | Status: DC | PRN
  Administered 2017-09-11: 4 mg via INTRAVENOUS

## 2017-09-11 MED ORDER — FENTANYL CITRATE (PF) 100 MCG/2ML IJ SOLN
50.0000 ug | Freq: Once | INTRAMUSCULAR | Status: AC
  Administered 2017-09-11: 50 ug via INTRAVENOUS

## 2017-09-11 MED ORDER — SODIUM CHLORIDE 0.9 % IV SOLN
INTRAVENOUS | Status: DC | PRN
  Administered 2017-09-11: 50 ug/min via INTRAVENOUS

## 2017-09-11 MED ORDER — PROPOFOL 10 MG/ML IV BOLUS
INTRAVENOUS | Status: DC | PRN
  Administered 2017-09-11: 140 mg via INTRAVENOUS

## 2017-09-11 MED ORDER — TECHNETIUM TC 99M SULFUR COLLOID FILTERED
1.0000 | Freq: Once | INTRAVENOUS | Status: AC | PRN
  Administered 2017-09-11: 1 via INTRADERMAL

## 2017-09-11 MED ORDER — LACTATED RINGERS IV SOLN
Freq: Once | INTRAVENOUS | Status: AC
  Administered 2017-09-11: 09:00:00 via INTRAVENOUS

## 2017-09-11 MED ORDER — GABAPENTIN 300 MG PO CAPS
300.0000 mg | ORAL_CAPSULE | ORAL | Status: AC
  Administered 2017-09-11: 300 mg via ORAL
  Filled 2017-09-11: qty 1

## 2017-09-11 SURGICAL SUPPLY — 72 items
APPLIER CLIP 9.375 MED OPEN (MISCELLANEOUS) ×4
BAG DECANTER FOR FLEXI CONT (MISCELLANEOUS) ×4 IMPLANT
BINDER BREAST LRG (GAUZE/BANDAGES/DRESSINGS) IMPLANT
BINDER BREAST XLRG (GAUZE/BANDAGES/DRESSINGS) ×4 IMPLANT
BLADE SURG 15 STRL LF DISP TIS (BLADE) ×4 IMPLANT
BLADE SURG 15 STRL SS (BLADE) ×4
CANISTER SUCT 3000ML PPV (MISCELLANEOUS) ×4 IMPLANT
CHLORAPREP W/TINT 10.5 ML (MISCELLANEOUS) ×4 IMPLANT
CHLORAPREP W/TINT 26ML (MISCELLANEOUS) ×4 IMPLANT
CLIP APPLIE 9.375 MED OPEN (MISCELLANEOUS) ×2 IMPLANT
CLIP VESOCCLUDE MED 6/CT (CLIP) ×8 IMPLANT
CONT SPEC 4OZ CLIKSEAL STRL BL (MISCELLANEOUS) ×4 IMPLANT
COVER PROBE W GEL 5X96 (DRAPES) ×4 IMPLANT
COVER SURGICAL LIGHT HANDLE (MISCELLANEOUS) ×8 IMPLANT
COVER TRANSDUCER ULTRASND GEL (DRAPE) ×4 IMPLANT
CRADLE DONUT ADULT HEAD (MISCELLANEOUS) ×4 IMPLANT
DECANTER SPIKE VIAL GLASS SM (MISCELLANEOUS) ×4 IMPLANT
DERMABOND ADVANCED (GAUZE/BANDAGES/DRESSINGS) ×4
DERMABOND ADVANCED .7 DNX12 (GAUZE/BANDAGES/DRESSINGS) ×4 IMPLANT
DEVICE DUBIN SPECIMEN MAMMOGRA (MISCELLANEOUS) ×8 IMPLANT
DRAPE C-ARM 42X72 X-RAY (DRAPES) ×4 IMPLANT
DRAPE CHEST BREAST 15X10 FENES (DRAPES) ×4 IMPLANT
DRAPE UTILITY XL STRL (DRAPES) ×4 IMPLANT
DRSG PAD ABDOMINAL 8X10 ST (GAUZE/BANDAGES/DRESSINGS) ×4 IMPLANT
ELECT CAUTERY BLADE 6.4 (BLADE) ×4 IMPLANT
ELECT COATED BLADE 2.86 ST (ELECTRODE) ×4 IMPLANT
ELECT REM PT RETURN 9FT ADLT (ELECTROSURGICAL) ×4
ELECTRODE REM PT RTRN 9FT ADLT (ELECTROSURGICAL) ×2 IMPLANT
GAUZE 4X4 16PLY RFD (DISPOSABLE) ×4 IMPLANT
GEL ULTRASOUND 20GR AQUASONIC (MISCELLANEOUS) ×4 IMPLANT
GLOVE BIOGEL PI IND STRL 8 (GLOVE) ×2 IMPLANT
GLOVE BIOGEL PI INDICATOR 8 (GLOVE) ×2
GLOVE ECLIPSE 7.5 STRL STRAW (GLOVE) ×8 IMPLANT
GOWN STRL REUS W/ TWL LRG LVL3 (GOWN DISPOSABLE) ×4 IMPLANT
GOWN STRL REUS W/ TWL XL LVL3 (GOWN DISPOSABLE) ×6 IMPLANT
GOWN STRL REUS W/TWL LRG LVL3 (GOWN DISPOSABLE) ×4
GOWN STRL REUS W/TWL XL LVL3 (GOWN DISPOSABLE) ×6
ILLUMINATOR WAVEGUIDE N/F (MISCELLANEOUS) IMPLANT
INTRODUCER COOK 11FR (CATHETERS) IMPLANT
IV CATH 14GX2 1/4 (CATHETERS) IMPLANT
KIT BASIN OR (CUSTOM PROCEDURE TRAY) ×4 IMPLANT
KIT MARKER MARGIN INK (KITS) ×4 IMPLANT
KIT PORT POWER 8FR ISP CVUE (Port) ×4 IMPLANT
KIT TURNOVER KIT B (KITS) ×4 IMPLANT
NDL SAFETY ECLIPSE 18X1.5 (NEEDLE) IMPLANT
NEEDLE 22X1 1/2 (OR ONLY) (NEEDLE) ×4 IMPLANT
NEEDLE FILTER BLUNT 18X 1/2SAF (NEEDLE)
NEEDLE FILTER BLUNT 18X1 1/2 (NEEDLE) IMPLANT
NEEDLE HYPO 18GX1.5 SHARP (NEEDLE)
NEEDLE HYPO 25GX1X1/2 BEV (NEEDLE) ×4 IMPLANT
NS IRRIG 1000ML POUR BTL (IV SOLUTION) ×4 IMPLANT
PACK SURGICAL SETUP 50X90 (CUSTOM PROCEDURE TRAY) ×4 IMPLANT
PAD ARMBOARD 7.5X6 YLW CONV (MISCELLANEOUS) ×4 IMPLANT
PENCIL BUTTON HOLSTER BLD 10FT (ELECTRODE) ×4 IMPLANT
SET INTRODUCER 12FR PACEMAKER (INTRODUCER) IMPLANT
SET SHEATH INTRODUCER 10FR (MISCELLANEOUS) IMPLANT
SHEATH COOK PEEL AWAY SET 9F (SHEATH) IMPLANT
SPONGE LAP 18X18 X RAY DECT (DISPOSABLE) ×4 IMPLANT
SUT MON AB 5-0 PS2 18 (SUTURE) ×8 IMPLANT
SUT PROLENE 2 0 SH DA (SUTURE) ×4 IMPLANT
SUT SILK 2 0 (SUTURE) ×2
SUT SILK 2-0 18XBRD TIE 12 (SUTURE) ×2 IMPLANT
SUT VIC AB 3-0 SH 18 (SUTURE) ×4 IMPLANT
SYR 5ML LUER SLIP (SYRINGE) ×4 IMPLANT
SYR BULB 3OZ (MISCELLANEOUS) ×4 IMPLANT
SYR CONTROL 10ML LL (SYRINGE) ×4 IMPLANT
TOWEL OR 17X24 6PK STRL BLUE (TOWEL DISPOSABLE) ×4 IMPLANT
TOWEL OR 17X26 10 PK STRL BLUE (TOWEL DISPOSABLE) ×4 IMPLANT
TRAY LAPAROSCOPIC MC (CUSTOM PROCEDURE TRAY) ×4 IMPLANT
TUBE CONNECTING 12'X1/4 (SUCTIONS) ×1
TUBE CONNECTING 12X1/4 (SUCTIONS) ×3 IMPLANT
YANKAUER SUCT BULB TIP NO VENT (SUCTIONS) ×4 IMPLANT

## 2017-09-11 NOTE — Transfer of Care (Signed)
Immediate Anesthesia Transfer of Care Note  Patient: Sylvia Barnett  Procedure(s) Performed: LEFT BREAST LUMPECTOMY X2 WITH RADIOACTIVE SEED AND LEFT AXILLARY SENTINEL LYMPH NODE BIOPSY (Left Breast) INSERTION PORT-A-CATH (N/A )  Patient Location: PACU  Anesthesia Type:GA combined with regional for post-op pain  Level of Consciousness: drowsy  Airway & Oxygen Therapy: Patient Spontanous Breathing and Patient connected to nasal cannula oxygen  Post-op Assessment: Report given to RN and Post -op Vital signs reviewed and stable  Post vital signs: Reviewed and stable  Last Vitals:  Vitals Value Taken Time  BP 137/86 09/11/2017  1:30 PM  Temp 36.4 C 09/11/2017  1:30 PM  Pulse 69 09/11/2017  1:36 PM  Resp 22 09/11/2017  1:36 PM  SpO2 99 % 09/11/2017  1:36 PM  Vitals shown include unvalidated device data.  Last Pain:  Vitals:   09/11/17 1330  PainSc: 0-No pain      Patients Stated Pain Goal: 3 (89/78/47 8412)  Complications: No apparent anesthesia complications

## 2017-09-11 NOTE — Anesthesia Preprocedure Evaluation (Signed)
Anesthesia Evaluation  Patient identified by MRN, date of birth, ID band Patient awake    Reviewed: Allergy & Precautions, NPO status , Patient's Chart, lab work & pertinent test results  Airway Mallampati: II  TM Distance: >3 FB Neck ROM: Full    Dental  (+) Teeth Intact, Dental Advisory Given   Pulmonary    breath sounds clear to auscultation       Cardiovascular hypertension,  Rhythm:Regular Rate:Normal     Neuro/Psych    GI/Hepatic   Endo/Other    Renal/GU      Musculoskeletal   Abdominal   Peds  Hematology   Anesthesia Other Findings   Reproductive/Obstetrics                             Anesthesia Physical Anesthesia Plan  ASA: II  Anesthesia Plan: General   Post-op Pain Management:  Regional for Post-op pain   Induction: Intravenous  PONV Risk Score and Plan: Ondansetron and Dexamethasone  Airway Management Planned: LMA  Additional Equipment:   Intra-op Plan:   Post-operative Plan:   Informed Consent: I have reviewed the patients History and Physical, chart, labs and discussed the procedure including the risks, benefits and alternatives for the proposed anesthesia with the patient or authorized representative who has indicated his/her understanding and acceptance.     Dental advisory given  Plan Discussed with: CRNA and Anesthesiologist  Anesthesia Plan Comments:         Anesthesia Quick Evaluation  

## 2017-09-11 NOTE — Anesthesia Procedure Notes (Signed)
Procedure Name: LMA Insertion Date/Time: 09/11/2017 10:51 AM Performed by: Valda Favia, CRNA Pre-anesthesia Checklist: Patient identified, Emergency Drugs available, Suction available and Patient being monitored Patient Re-evaluated:Patient Re-evaluated prior to induction Oxygen Delivery Method: Circle System Utilized Preoxygenation: Pre-oxygenation with 100% oxygen Induction Type: IV induction Ventilation: Mask ventilation without difficulty LMA: LMA inserted LMA Size: 4.0 Number of attempts: 1 Airway Equipment and Method: Bite block Placement Confirmation: positive ETCO2 Tube secured with: Tape Dental Injury: Teeth and Oropharynx as per pre-operative assessment

## 2017-09-11 NOTE — Op Note (Signed)
Preoperative Diagnosis: LEFT BREAST CANCER  Postoprative Diagnosis: LEFT BREAST CANCER  Procedure: Procedure(s): LEFT BREAST LUMPECTOMY X2 WITH RADIOACTIVE SEED AND DEEP LEFT AXILLARY SENTINEL LYMPH NODE BIOPSY INSERTION PORT-A-CATH with fluoroscopy and ultrasound guidance   Surgeon: Excell Seltzer T   Assistants: None  Anesthesia:  General LMA anesthesia  Indications: 72 year old female with a new diagnosis of cancer of the left breast, upper outer quadrant, 1.1 cm. Clinical stage !A, ER positive, PR negative, HER-2 positive. in addiations 3.7 cm second  area of faint calcifications 3.7 cm biopsy negative but potentially discordant.  She will require chemotherapy with Herceptin.  After extensive preoperative work-up and discussion detailed elsewhere we have elected to proceed with radioactive seed localized left breast lumpectomy x2 for the known cancer and discordant area of calcifications with left axillary sentinel lymph node biopsy and placement of Port-A-Cath.   Procedure Detail: Patient had previously undergone placement of a radioactive seed at the 2 clip sites in the upper outer quadrant and more medial breast.  In the holding area she underwent a pectoral block by anesthesia and underwent injection of 1 mCi of technetium sulfur colloid intradermally around the left nipple.  I confirmed the seeds were present in the holding area.  She was taken to the operating room, placed in the supine position on the operating table and laryngeal mask general anesthesia induced.  Left arm was carefully extended.  The entire left breast and axilla and upper arm were widely sterilely prepped and draped.  She received preoperative IV antibiotics.  PAS were in place.  Patient timeout was performed and correct procedure verified. The 2 seeds were localized in the breast.  I initially localized the upper outer area of known cancer.  I used a circum-areolar incision and dissection was carried down through  the subtenons tissue for the breast capsule.  Short skin and subcutaneous flaps were raised.  Using the neoprobe for guidance I dissected down toward the area of high counts and then excised an approximately 2-1/2 cm globular specimen of breast tissue around the area of high counts.  The breast in this area was thin and this was taken down to the chest wall and superficially with subcutaneous tissue.  Specimen x-ray was obtained showing the coil-shaped clip and seed centrally located within the specimen.  It was oriented with ink and sent as a specimen. The second seed was localized quite superiorly in the central medial breast.  This was approachable through the previous incision.  Skin and subcutaneous flap was raised superiorly over the area of high counts.  As I dissected there was scar in this area likely from previous lumpectomy and firm breast tissue.  Dissecting a fairly thin skin flap I did expose the seed which was carefully removed.  This area of breast tissue underneath this was then excised removing about a 2 cm specimen.  Specimen x-ray showed the-shaped clip within the specimen and we confirmed the seed with specimen x-ray.  Wound was irrigated and complete hemostasis obtained.  Skin was infiltrated with Marcaine.  The known malignancy lumpectomy site was marked with 6 clips and a single clip was placed more superiorly at the site of previously biopsied benign calcifications.  Deep breast and subcutaneous tissue was closed with interrupted 3-0 Vicryl. Attention was turned to the sentinel lymph node biopsy.  A hot area in the left axilla was localized and a small transverse incision made.  Dissection was carried down through the subcutaneous tissue and the clavipectoral fascia  incised.  Using the neoprobe for guidance I dissected down onto a deep axillary lymph node which was small and soft with elevated counts and it was excised with cautery.  This had counts of about 180.  There was still some high  counts a little more posteriorly to this and using the neoprobe for guidance I dissected down onto a second deep more posterior small lymph node which was also excised with cautery and had counts of over 200.  At this point background counts in the axilla were essentially 0 and there was no palpable adenopathy.  Hemostasis was assured in the deep axillary and subtenons tissue at this incision was closed with interrupted 3-0 Vicryl.  Both the breast and axillary incisions were closed with subcuticular 5-0 Monocryl and Dermabond. Patient was then reprepped for Port-A-Cath widely prepping the right neck and upper arm and chest.  Patient timeout was again performed.  Ultrasound was used to localize the internal jugular vein.  With a small stab incision the vein is cannulated with a needle and guidewire under direct ultrasound visualization.  Fluoroscopy showed the guidewire in the vena cava.  The introducer was passed over the guidewire and the flush catheter was placed via the introducer which was stripped away at the tip of the catheter positioned in the superior vena cava confirmed by fluoroscopy.  A site on the right anterior chest wall was anesthetized and a small transverse incision made in some cutaneous pocket created.  The catheter was tunneled subcutaneously to the pocket, trimmed to length and attached to the port which was sutured to the anterior chest wall with interrupted 2-0 Prolene.  Fluoroscopy again confirmed good position of the catheter.  The incisions were closed with subcutaneous interrupted 5-0 Monocryl in subcuticular 5-0 Monocryl and Dermabond.  The port flushed and aspirated easily and was left Flushed with concentrated heparin solution.  Dermabond was applied to these incisions.  Sponge needle and instrument counts were correct.   Findings: As above  Estimated Blood Loss:  less than 50 mL         Drains: None  Blood Given: none          Specimens: #1 left breast lumpectomy known  malignancy   #2 left breast lumpectomy discordant calcifications   #3 left axillary sentinel lymph nodes X 2        Complications:  * No complications entered in OR log *         Disposition: PACU - hemodynamically stable.         Condition: stable

## 2017-09-11 NOTE — Anesthesia Procedure Notes (Signed)
Anesthesia Regional Block: Pectoralis block   Pre-Anesthetic Checklist: ,, timeout performed, Correct Patient, Correct Site, Correct Laterality, Correct Procedure, Correct Position, site marked, Risks and benefits discussed,  Surgical consent,  Pre-op evaluation,  At surgeon's request and post-op pain management  Laterality: Left  Prep: chloraprep       Needles:  Injection technique: Single-shot  Needle Type: Echogenic Needle     Needle Length: 9cm  Needle Gauge: 21     Additional Needles:   Procedures:,,,, ultrasound used (permanent image in chart),,,,  Narrative:  Start time: 09/11/2017 9:50 AM End time: 09/11/2017 10:00 AM Injection made incrementally with aspirations every 5 mL.  Performed by: Personally  Anesthesiologist: Roberts Gaudy, MD  Additional Notes: 30 cc 0.5% Marcaine 1:200 epi with 0.3 mg clonidine injected easily

## 2017-09-11 NOTE — Discharge Instructions (Signed)
[facility] °Office Phone Number [phone] ° °BREAST BIOPSY/ PARTIAL MASTECTOMY: POST OP INSTRUCTIONS ° °Always review your discharge instruction sheet given to you by the facility where your surgery was performed. ° °IF YOU HAVE DISABILITY OR FAMILY LEAVE FORMS, YOU MUST BRING THEM TO THE OFFICE FOR PROCESSING.  DO NOT GIVE THEM TO YOUR DOCTOR. ° °1. A prescription for pain medication may be given to you upon discharge.  Take your pain medication as prescribed, if needed.  If narcotic pain medicine is not needed, then you may take acetaminophen (Tylenol) or ibuprofen (Advil) as needed. °2. Take your usually prescribed medications unless otherwise directed °3. If you need a refill on your pain medication, please contact your pharmacy.  They will contact our office to request authorization.  Prescriptions will not be filled after 5pm or on week-ends. °4. You should eat very light the first 24 hours after surgery, such as soup, crackers, pudding, etc.  Resume your normal diet the day after surgery. °5. Most patients will experience some swelling and bruising in the breast.  Ice packs and a good support bra will help.  Swelling and bruising can take several days to resolve.  °6. It is common to experience some constipation if taking pain medication after surgery.  Increasing fluid intake and taking a stool softener will usually help or prevent this problem from occurring.  A mild laxative (Milk of Magnesia or Miralax) should be taken according to package directions if there are no bowel movements after 48 hours. °7. Unless discharge instructions indicate otherwise, you may remove your bandages 24-48 hours after surgery, and you may shower at that time.  You may have steri-strips (small skin tapes) in place directly over the incision.  These strips should be left on the skin for 7-10 days.  If your surgeon used skin glue on the incision, you may shower in 24 hours.  The glue will flake off over the  next 2-3 weeks.  Any sutures or staples will be removed at the office during your follow-up visit. °8. ACTIVITIES:  You may resume regular daily activities (gradually increasing) beginning the next day.  Wearing a good support bra or sports bra minimizes pain and swelling.  You may have sexual intercourse when it is comfortable. °a. You may drive when you no longer are taking prescription pain medication, you can comfortably wear a seatbelt, and you can safely maneuver your car and apply brakes. °b. RETURN TO WORK:  ______________________________________________________________________________________ °9. You should see your doctor in the office for a follow-up appointment approximately two weeks after your surgery.  Your doctor’s nurse will typically make your follow-up appointment when she calls you with your pathology report.  Expect your pathology report 2-3 business days after your surgery.  You may call to check if you do not hear from us after three days. °10. OTHER INSTRUCTIONS: _______________________________________________________________________________________________ _____________________________________________________________________________________________________________________________________ °_____________________________________________________________________________________________________________________________________ °_____________________________________________________________________________________________________________________________________ ° °WHEN TO CALL YOUR DOCTOR: °1. Fever over 101.0 °2. Nausea and/or vomiting. °3. Extreme swelling or bruising. °4. Continued bleeding from incision. °5. Increased pain, redness, or drainage from the incision. ° °The clinic staff is available to answer your questions during regular business hours.  Please don’t hesitate to call and ask to speak to one of the nurses for clinical concerns.  If you have a medical emergency, go to the nearest  emergency room or call 911.  A surgeon from [facility] is always on call at the hospital. ° °For further questions, please visit [email or web address]  ° ° ° ° ° ° °  PORT-A-CATH: POST OP INSTRUCTIONS  Always review your discharge instruction sheet given to you by the facility where your surgery was performed.   1. A prescription for pain medication may be given to you upon discharge. Take your pain medication as prescribed, if needed. If narcotic pain medicine is not needed, then you make take acetaminophen (Tylenol) or ibuprofen (Advil) as needed.  2. Take your usually prescribed medications unless otherwise directed. 3. If you need a refill on your pain medication, please contact our office. All narcotic pain medicine now requires a paper prescription.  Phoned in and fax refills are no longer allowed by law.  Prescriptions will not be filled after 5 pm or on weekends.  4. You should follow a light diet for the remainder of the day after your procedure. 5. Most patients will experience some mild swelling and/or bruising in the area of the incision. It may take several days to resolve. 6. It is common to experience some constipation if taking pain medication after surgery. Increasing fluid intake and taking a stool softener (such as Colace) will usually help or prevent this problem from occurring. A mild laxative (Milk of Magnesia or Miralax) should be taken according to package directions if there are no bowel movements after 48 hours.  7. Unless discharge instructions indicate otherwise, you may remove your bandages 48 hours after surgery, and you may shower at that time. You may have steri-strips (small white skin tapes) in place directly over the incision.  These strips should be left on the skin for 7-10 days.  If your surgeon used Dermabond (skin glue) on the incision, you may shower in 24 hours.  The glue will flake off over the next 2-3 weeks.  8. If your port is left accessed at  the end of surgery (needle left in port), the dressing cannot get wet and should only by changed by a healthcare professional. When the port is no longer accessed (when the needle has been removed), follow step 7.   9. ACTIVITIES:  Limit activity involving your arms for the next 72 hours. Do no strenuous exercise or activity for 1 week. You may drive when you are no longer taking prescription pain medication, you can comfortably wear a seatbelt, and you can maneuver your car. 10.You may need to see your doctor in the office for a follow-up appointment.  Please       check with your doctor.  11.When you receive a new Port-a-Cath, you will get a product guide and        ID card.  Please keep them in case you need them.  WHEN TO CALL YOUR DOCTOR ([phone]): 1. Fever over 101.0 2. Chills 3. Continued bleeding from incision 4. Increased redness and tenderness at the site 5. Shortness of breath, difficulty breathing   The clinic staff is available to answer your questions during regular business hours. Please don't hesitate to call and ask to speak to one of the nurses or medical assistants for clinical concerns. If you have a medical emergency, go to the nearest emergency room or call 911.  A surgeon from [facility] is always on call at the hospital.     For further information, please visit [email or web address]   Post Anesthesia Home Care Instructions  Activity: Get plenty of rest for the remainder of the day. A responsible individual must stay with you for 24 hours following the procedure.  For the next 24 hours, DO NOT: -Drive a car -Operate machinery -  Drink alcoholic beverages -Take any medication unless instructed by your physician -Make any legal decisions or sign important papers.  Meals: Start with liquid foods such as gelatin or soup. Progress to regular foods as tolerated. Avoid greasy, spicy, heavy foods. If nausea and/or vomiting occur, drink only  clear liquids until the nausea and/or vomiting subsides. Call your physician if vomiting continues.  Special Instructions/Symptoms: Your throat may feel dry or sore from the anesthesia or the breathing tube placed in your throat during surgery. If this causes discomfort, gargle with warm salt water. The discomfort should disappear within 24 hours.  If you had a scopolamine patch placed behind your ear for the management of post- operative nausea and/or vomiting:  1. The medication in the patch is effective for 72 hours, after which it should be removed.  Wrap patch in a tissue and discard in the trash. Wash hands thoroughly with soap and water. 2. You may remove the patch earlier than 72 hours if you experience unpleasant side effects which may include dry mouth, dizziness or visual disturbances. 3. Avoid touching the patch. Wash your hands with soap and water after contact with the patch.        

## 2017-09-11 NOTE — Interval H&P Note (Signed)
History and Physical Interval Note:  09/11/2017 10:26 AM  Sylvia Barnett  has presented today for surgery, with the diagnosis of LEFT BREAST CANCER  The various methods of treatment have been discussed with the patient and family. After consideration of risks, benefits and other options for treatment, the patient has consented to  Procedure(s): LEFT BREAST LUMPECTOMY X2 WITH RADIOACTIVE SEED AND LEFT AXILLARY SENTINEL LYMPH NODE BIOPSY (Left) INSERTION PORT-A-CATH (N/A) as a surgical intervention .  The patient's history has been reviewed, patient examined, no change in status, stable for surgery.  I have reviewed the patient's chart and labs.  Questions were answered to the patient's satisfaction.     Darene Lamer Duayne Brideau

## 2017-09-12 DIAGNOSIS — G8918 Other acute postprocedural pain: Secondary | ICD-10-CM | POA: Diagnosis not present

## 2017-09-15 ENCOUNTER — Encounter (HOSPITAL_COMMUNITY): Payer: Self-pay | Admitting: General Surgery

## 2017-09-15 NOTE — Anesthesia Postprocedure Evaluation (Signed)
Anesthesia Post Note  Patient: TRICIA PLEDGER  Procedure(s) Performed: LEFT BREAST LUMPECTOMY X2 WITH RADIOACTIVE SEED AND LEFT AXILLARY SENTINEL LYMPH NODE BIOPSY (Left Breast) INSERTION PORT-A-CATH (N/A )     Patient location during evaluation: PACU Anesthesia Type: General Level of consciousness: awake and alert Pain management: pain level controlled Vital Signs Assessment: post-procedure vital signs reviewed and stable Respiratory status: spontaneous breathing, nonlabored ventilation, respiratory function stable and patient connected to nasal cannula oxygen Cardiovascular status: blood pressure returned to baseline and stable Postop Assessment: no apparent nausea or vomiting Anesthetic complications: no    Last Vitals:  Vitals:   09/11/17 1400 09/11/17 1415  BP: 115/69 125/89  Pulse: 61 66  Resp: 14 (!) 28  Temp:    SpO2: 94% 90%    Last Pain:  Vitals:   09/11/17 1415  PainSc: 3                  Nivek Powley S

## 2017-09-21 ENCOUNTER — Ambulatory Visit: Payer: Medicare Other | Admitting: Hematology and Oncology

## 2017-09-23 ENCOUNTER — Inpatient Hospital Stay: Payer: Medicare Other

## 2017-09-23 ENCOUNTER — Encounter: Payer: Self-pay | Admitting: *Deleted

## 2017-09-23 ENCOUNTER — Inpatient Hospital Stay: Payer: Medicare Other | Attending: Hematology and Oncology | Admitting: Hematology and Oncology

## 2017-09-23 DIAGNOSIS — C50412 Malignant neoplasm of upper-outer quadrant of left female breast: Secondary | ICD-10-CM | POA: Insufficient documentation

## 2017-09-23 DIAGNOSIS — Z170 Estrogen receptor positive status [ER+]: Secondary | ICD-10-CM | POA: Insufficient documentation

## 2017-09-23 NOTE — Assessment & Plan Note (Addendum)
09/11/2017:Left lumpectomy: Grade 2 invasive lobular cancer, 1.1 cm, LCIS, margins negative, 0/2 lymph nodes negative, ER 90% strong staining, PR negative, HER-2 positive ratio 2.62, Ki-67 1%, T1c N0 stage Ia  Pathology counseling: I discussed the final pathology report of the patient provided  a copy of this report. I discussed the margins as well as lymph node surgeries. We also discussed the final staging along with previously performed ER/PR and HER-2/neu testing.  Recommendation: 1. Adjuvant therapy with Taxol Herceptin weekly x12 followed by Herceptin maintenance for 1 year 2.  Adjuvant radiation  3.  Followed by adjuvant antiestrogen therapy  Return to clinic in 3 weeks to start chemotherapy

## 2017-09-23 NOTE — Progress Notes (Signed)
Patient Care Team: Manon Hilding, MD as PCP - General (Unknown Physician Specialty) Excell Seltzer, MD as Consulting Physician (General Surgery) Nicholas Lose, MD as Consulting Physician (Hematology and Oncology) Gery Pray, MD as Consulting Physician (Radiation Oncology)  DIAGNOSIS:  Encounter Diagnosis  Name Primary?  . Malignant neoplasm of upper-outer quadrant of left breast in female, estrogen receptor positive ([place])     SUMMARY OF ONCOLOGIC HISTORY:   Malignant neoplasm of upper-outer quadrant of left breast in female, estrogen receptor positive ([place])   08/05/2017 Initial Diagnosis    Screening detected left breast calcifications UOQ 1.1 cm biopsy revealed invasive lobular cancer with LCIS and CSL, grade 2, ER 90%, PR 0%, Ki-67 1%, HER-2 positive ratio 2.62, T2N0 stage Ia clinical stage AJCC 8    08/12/2017 Cancer Staging    Staging form: Breast, AJCC 8th Edition - Clinical stage from 08/12/2017: Stage IA (cT1c, cN0, cM0, G2, ER+, PR-, HER2+) - Signed by Nicholas Lose, MD on 08/12/2017    09/11/2017 Surgery    Left lumpectomy: Grade 2 invasive lobular cancer, 1.1 cm, LCIS, margins negative, 0/2 lymph nodes negative, ER 90% strong staining, PR negative, HER-2 positive ratio 2.62, Ki-67 1%, T1c N0 stage Ia    09/23/2017 Cancer Staging    Staging form: Breast, AJCC 8th Edition - Pathologic: Stage IA (pT1c, pN0, cM0, G2, ER+, PR-, HER2+) - Signed by Gardenia Phlegm, NP on 09/23/2017     CHIEF COMPLIANT: Follow-up after recent surgery for breast cancer  INTERVAL HISTORY: Sylvia Barnett is a 72 year old with above-mentioned history of left breast cancer treated with lumpectomy and is here to discuss the results of the lumpectomy.  She is recovering very well from the surgery.  Denies any major pain or discomfort.  She is accompanied by her husband today.  REVIEW OF SYSTEMS:   Constitutional: Denies fevers, chills or abnormal weight loss Eyes: Denies blurriness  of vision Ears, nose, mouth, throat, and face: Denies mucositis or sore throat Respiratory: Denies cough, dyspnea or wheezes Cardiovascular: Denies palpitation, chest discomfort Gastrointestinal:  Denies nausea, heartburn or change in bowel habits Skin: Denies abnormal skin rashes Lymphatics: Denies new lymphadenopathy or easy bruising Neurological:Denies numbness, tingling or new weaknesses Behavioral/Psych: Mood is stable, no new changes  Extremities: No lower extremity edema Breast: Recent right lumpectomy All other systems were reviewed with the patient and are negative.  I have reviewed the past medical history, past surgical history, social history and family history with the patient and they are unchanged from previous note.  ALLERGIES:  is allergic to fiorinal-codeine #3 [butalbital-asa-caff-codeine]; lansoprazole; latex; butalbital-aspirin-caffeine; sulfa antibiotics; and sulfasalazine.  MEDICATIONS:  Current Outpatient Medications  Medication Sig Dispense Refill  . atorvastatin (LIPITOR) 10 MG tablet Take 10 mg by mouth at bedtime.    . Calcium Carb-Cholecalciferol (CALCIUM 600+D) 600-800 MG-UNIT TABS Take 1 tablet by mouth daily.     . cyclobenzaprine (FLEXERIL) 10 MG tablet Take 10 mg by mouth 3 (three) times daily as needed for muscle spasms.     Marland Kitchen dicyclomine (BENTYL) 10 MG capsule Take 1 capsule (10 mg total) by mouth 3 (three) times daily as needed for spasms. Patient states that she started this 3 weeks ago (Patient taking differently: Take 10 mg by mouth daily. ) 270 capsule 1  . escitalopram (LEXAPRO) 5 MG tablet Take 5 mg by mouth daily.    . hydrochlorothiazide (HYDRODIURIL) 25 MG tablet Take 25 mg by mouth daily.    . hydroxypropyl methylcellulose / hypromellose (  ISOPTO TEARS / GONIOVISC) 2.5 % ophthalmic solution Place 1 drop into both eyes 3 (three) times daily as needed for dry eyes.    Marland Kitchen loperamide (IMODIUM) 2 MG capsule Take 2 mg by mouth daily before breakfast.     . meloxicam (MOBIC) 7.5 MG tablet Take 7.5 mg by mouth 2 (two) times daily as needed for pain.     . Menthol, Topical Analgesic, (ICY HOT) 5 % PADS Apply 1 patch topically daily as needed (pain).     Marland Kitchen oxyCODONE (OXY IR/ROXICODONE) 5 MG immediate release tablet Take 1 tablet (5 mg total) by mouth every 6 (six) hours as needed for severe pain. 10 tablet 0   No current facility-administered medications for this visit.     PHYSICAL EXAMINATION: ECOG PERFORMANCE STATUS: 1 - Symptomatic but completely ambulatory  Vitals:   09/23/17 0939  BP: (!) 179/77  Pulse: 80  Resp: 18  Temp: 98.4 F (36.9 C)  SpO2: 96%   Filed Weights   09/23/17 0939  Weight: 216 lb 12.8 oz (98.3 kg)    GENERAL:alert, no distress and comfortable SKIN: skin color, texture, turgor are normal, no rashes or significant lesions EYES: normal, Conjunctiva are pink and non-injected, sclera clear OROPHARYNX:no exudate, no erythema and lips, buccal mucosa, and tongue normal  NECK: supple, thyroid normal size, non-tender, without nodularity LYMPH:  no palpable lymphadenopathy in the cervical, axillary or inguinal LUNGS: clear to auscultation and percussion with normal breathing effort HEART: regular rate & rhythm and no murmurs and no lower extremity edema ABDOMEN:abdomen soft, non-tender and normal bowel sounds MUSCULOSKELETAL:no cyanosis of digits and no clubbing  NEURO: alert & oriented x 3 with fluent speech, no focal motor/sensory deficits EXTREMITIES: No lower extremity edema   LABORATORY DATA:  I have reviewed the data as listed CMP Latest Ref Rng & Units 09/03/2017 08/12/2017 08/12/2013  Glucose 70 - 99 mg/dL 105(H) 115(H) 118(H)  BUN 8 - 23 mg/dL 24(H) 21 21  Creatinine 0.44 - 1.00 mg/dL 0.70 0.85 0.82  Sodium 135 - 145 mmol/L 141 142 145  Potassium 3.5 - 5.1 mmol/L 3.5 3.3(L) 3.8  Chloride 98 - 111 mmol/L 109 108 103  CO2 22 - 32 mmol/L 26 25 32  Calcium 8.9 - 10.3 mg/dL 9.2 9.4 9.4  Total Protein 6.5 -  8.1 g/dL - 6.7 -  Total Bilirubin 0.3 - 1.2 mg/dL - 0.6 -  Alkaline Phos 38 - 126 U/L - 81 -  AST 15 - 41 U/L - 14(L) -  ALT 0 - 44 U/L - 19 -    Lab Results  Component Value Date   WBC 5.6 09/03/2017   HGB 13.9 09/03/2017   HCT 44.0 09/03/2017   MCV 96.3 09/03/2017   PLT 161 09/03/2017   NEUTROABS 3.6 08/12/2017    ASSESSMENT & PLAN:  Malignant neoplasm of upper-outer quadrant of left breast in female, estrogen receptor positive ([facility]) 09/11/2017:Left lumpectomy: Grade 2 invasive lobular cancer, 1.1 cm, LCIS, margins negative, 0/2 lymph nodes negative, ER 90% strong staining, PR negative, HER-2 positive ratio 2.62, Ki-67 1%, T1c N0 stage Ia  Pathology counseling: I discussed the final pathology report of the patient provided  a copy of this report. I discussed the margins as well as lymph node surgeries. We also discussed the final staging along with previously performed ER/PR and HER-2/neu testing.  Recommendation: 1. Adjuvant therapy with Taxol Herceptin weekly x12 followed by Herceptin maintenance for 1 year 2.  Adjuvant radiation  3.  Followed  by adjuvant antiestrogen therapy  UPBEAT clinical trial ([facility] [id number]): Newly diagnosed stage I to III breast cancer patients receiving either adjuvant or neoadjuvant chemotherapy undergo cardiac MRI before treatment and at 24 months along with neurocognitive testing, exercise and disability measures at baseline 3, 12 and 24 months.  Return to clinic in 3 weeks to start chemotherapy  No orders of the defined types were placed in this encounter.  The patient has a good understanding of the overall plan. she agrees with it. she will call with any problems that may develop before the next visit here.   Harriette Ohara, MD 09/23/17

## 2017-10-07 ENCOUNTER — Encounter: Payer: Self-pay | Admitting: *Deleted

## 2017-10-07 ENCOUNTER — Other Ambulatory Visit: Payer: Self-pay | Admitting: Hematology and Oncology

## 2017-10-07 DIAGNOSIS — Z006 Encounter for examination for normal comparison and control in clinical research program: Secondary | ICD-10-CM

## 2017-10-14 ENCOUNTER — Telehealth: Payer: Self-pay

## 2017-10-14 NOTE — Telephone Encounter (Signed)
Returned patient's call. No answer, voicemail left with contact information to return call.

## 2017-10-15 ENCOUNTER — Inpatient Hospital Stay: Payer: Medicare Other | Admitting: *Deleted

## 2017-10-15 ENCOUNTER — Other Ambulatory Visit: Payer: Self-pay | Admitting: Hematology and Oncology

## 2017-10-15 ENCOUNTER — Ambulatory Visit (HOSPITAL_COMMUNITY)
Admission: RE | Admit: 2017-10-15 | Discharge: 2017-10-15 | Disposition: A | Discharge status: Home / Self Care | Payer: Medicare Other | Source: Ambulatory Visit | Attending: Hematology and Oncology | Admitting: Hematology and Oncology

## 2017-10-15 ENCOUNTER — Inpatient Hospital Stay: Payer: Medicare Other | Attending: Hematology and Oncology | Admitting: *Deleted

## 2017-10-15 ENCOUNTER — Other Ambulatory Visit: Payer: Self-pay | Admitting: *Deleted

## 2017-10-15 VITALS — BP 152/76 | HR 67 | Ht 65.0 in | Wt 216.0 lb

## 2017-10-15 DIAGNOSIS — C50412 Malignant neoplasm of upper-outer quadrant of left female breast: Secondary | ICD-10-CM | POA: Insufficient documentation

## 2017-10-15 DIAGNOSIS — Z170 Estrogen receptor positive status [ER+]: Principal | ICD-10-CM

## 2017-10-15 DIAGNOSIS — Z5111 Encounter for antineoplastic chemotherapy: Secondary | ICD-10-CM | POA: Insufficient documentation

## 2017-10-15 DIAGNOSIS — Z006 Encounter for examination for normal comparison and control in clinical research program: Secondary | ICD-10-CM | POA: Insufficient documentation

## 2017-10-15 DIAGNOSIS — Z5112 Encounter for antineoplastic immunotherapy: Secondary | ICD-10-CM | POA: Insufficient documentation

## 2017-10-15 MED ORDER — LIDOCAINE-PRILOCAINE 2.5-2.5 % EX CREA: TOPICAL_CREAM | CUTANEOUS | 3 refills | Status: DC

## 2017-10-15 MED ORDER — PROCHLORPERAZINE MALEATE 10 MG PO TABS: 10.0000 mg | ORAL_TABLET | Freq: Four times a day (QID) | ORAL | 1 refills | Status: DC | PRN

## 2017-10-15 MED ORDER — ONDANSETRON HCL 8 MG PO TABS: 8.0000 mg | ORAL_TABLET | Freq: Two times a day (BID) | ORAL | 1 refills | Status: DC | PRN

## 2017-10-15 MED ORDER — LORAZEPAM 0.5 MG PO TABS: 0.5000 mg | ORAL_TABLET | Freq: Every evening | ORAL | 0 refills | Status: DC | PRN

## 2017-10-15 NOTE — Progress Notes (Signed)
START OFF PATHWAY REGIMEN - Breast   [id number]:Paclitaxel + Trastuzumab:   A cycle is every 28 days:     Paclitaxel      Trastuzumab-xxxx      Trastuzumab-xxxx   **Always confirm dose/schedule in your pharmacy ordering system**  Patient Characteristics: Preoperative or Nonsurgical Candidate (Clinical Staging), Neoadjuvant Therapy followed by Surgery, Invasive Disease, Chemotherapy, HER2 Positive, ER Negative/Unknown Therapeutic Status: Preoperative or Nonsurgical Candidate (Clinical Staging) AJCC M Category: cM0 AJCC Grade: G2 Breast Surgical Plan: Neoadjuvant Therapy followed by Surgery ER Status: Negative (-) AJCC 8 Stage Grouping: IA HER2 Status: Positive (+) AJCC T Category: cT1c AJCC N Category: cN0 PR Status: Negative (-) Intent of Therapy: Curative Intent, Discussed with Patient

## 2017-10-15 NOTE — Progress Notes (Addendum)
[facility] [id number] Study: Baseline Activities Encounter: 10/15/17 @ 0850: After patient signature of ICF and HIPAA form for study entry, the baseline activites were initiated by research nurse: Vital Signs: BP and pulse were recorded at 0855 after sitting for 5 minutes. Taken in right arm in sitting position with feet uncrossed on the floor. Were then repeated after 1 minute. Neurocognitive Testing: Performed by trained research assistant, Farris Has. Questionnaires: Farris Has, research assistant provided to patient for completion. This RN reviewed for completeness and instructed patient to initial areas where she had made a strike-out or changed her answer. Depression Symptom Score = 4, thus no intervention indicated per study protocol. Physical Functions Testing: 6 minute walk test; Disabilities Measure-ROM and grip strength; SPPB-Balance tests, Gait speed, Narrow walk test, and Chair stand test was performed by this research nurse with assistance of Doreatha Martin, Geophysicist/field seismologist. Total SPPB score=9. Concomitant Medications: Medication list reviewed and updated with original start dates provided by patient. Dr. Lindi Adie e-scribed her anti-emetics and EMLA cream to begin 11/02/17.  Medical History/Adverse Events: Reports anxiety/depression since in her early 20's that is well controlled with escitalopram. She reports hypertension since 2013 controlled with HCTZ and elevated cholesterol since 2012 controlled with atorvastatin. All prior breast biopsies were for lobular in situ breast cancer and treated with lumpectomy. She did attempt few months of tamoxifen that caused significant hot flashes as well as letrozole for 1-2 months that resulted in rash, so both were discontinued. She denies ever taking hormone replacement therapy. Melanoma on her upper back in 1994 that was excised with clear margins. She has had several knee surgeries due to osteoarthritis with last surgery in 2013. Had TAH in 2002.  H/O cataract surgeries 2011 and 2013. Reports osteopenia per bone density scan in 2016-currently taking calcium/vit D OTC. She fractured head of left radius in 2018 due to fall in yard. She has chronic intermittent loose stools-takes imodium daily with addition of dicyclomine as needed. Reports intermittent moderate pain in right lower back and hip with prolonged standing. Also reports intermittent moderate pain in left shoulder and pain in her 3rd digit of left foot with ambulation. Cyclobenzaprine and rest usually help her ortho issues. Will request records from Dr. Consuello Masse (PCP) in [place] to complete her medical history (ROI signed by patient in office) assessment. Follow Up: Return to clinic on 11/02/17 for her fasting study labs and research labs with 1st chemotherapy treatment. She understands to fast 3 hours except water that morning. Research nurse explained when/how to apply the EMLA cream for her treatment and that MD is sending her scripts in and that it would be best to pick these up before she leaves on her trip next week.  Baseline Event Log  Event Grade-CTCE Version 5.0 Attribution Comments  Diarrhea--   Intermittent  1  N/A-Baseline Imodium 1-2/day + Bentyl prn usually resolves  Pain in extremity- Left shoulder (intermittent)  2 N/A-Baseline Rest and Flexeril  Pain in extremity- Left 3rd toe with ambulation  1 N/A-Baseline Rest resolves   Back pain- Intermittent   2 N/A-Baseline Rest and Flexeril  Pain in extremity- Right hip (intermittent)  2 N/A-Baseline Rest and Flexeril  Depression    1 N/A-Baseline Lexapro daily  Anxiety    1 N/A-Baseline Lexapro daily  Hypertension   2 N/A-Baseline HCTZ daily  High Cholesterol   1 N/A-Baseline Atorvastatin daily   The patient was thanked for her time and willingness to participate in the UPBEAT study. She was escorted  to lobby where her husband met her and will drive her over to the MRI department for baseline cardiac MRI.  Informed her that research nurse will leave a voice mail at her home # the Sunday before her treatment regarding fasting and EMLA cream. She is aware of how to reach the research nurse for any questions or concerns. Mauri Reading Michaelle Copas Therapist, sports, BSN Clinical Research Nurse 10/20/2017 @ 1145

## 2017-10-19 ENCOUNTER — Other Ambulatory Visit: Payer: Self-pay | Admitting: *Deleted

## 2017-10-19 DIAGNOSIS — Z170 Estrogen receptor positive status [ER+]: Principal | ICD-10-CM

## 2017-10-19 DIAGNOSIS — C50412 Malignant neoplasm of upper-outer quadrant of left female breast: Secondary | ICD-10-CM

## 2017-10-20 ENCOUNTER — Encounter: Payer: Self-pay | Admitting: *Deleted

## 2017-10-21 ENCOUNTER — Encounter: Payer: Self-pay | Admitting: *Deleted

## 2017-10-30 ENCOUNTER — Telehealth: Payer: Self-pay | Admitting: *Deleted

## 2017-10-30 NOTE — Telephone Encounter (Signed)
Left VM on home # to remind patient to fast 3 hours except for water prior to [place] lab appointment on 11/02/17. Also reminded how/when to apply EMLA cream for the visit as well. Sent MyChart message as well. Mauri Reading Michaelle Copas, Therapist, sports, Product/process development scientist

## 2017-11-02 ENCOUNTER — Encounter: Payer: Self-pay | Admitting: Hematology and Oncology

## 2017-11-02 ENCOUNTER — Encounter: Payer: Self-pay | Admitting: *Deleted

## 2017-11-02 ENCOUNTER — Inpatient Hospital Stay: Payer: Medicare Other

## 2017-11-02 ENCOUNTER — Other Ambulatory Visit: Payer: Self-pay | Admitting: Hematology and Oncology

## 2017-11-02 ENCOUNTER — Inpatient Hospital Stay (HOSPITAL_BASED_OUTPATIENT_CLINIC_OR_DEPARTMENT_OTHER): Payer: Medicare Other | Admitting: Hematology and Oncology

## 2017-11-02 VITALS — BP 171/81 | HR 67 | Temp 98.6°F | Resp 18

## 2017-11-02 DIAGNOSIS — Z95828 Presence of other vascular implants and grafts: Secondary | ICD-10-CM

## 2017-11-02 DIAGNOSIS — C50412 Malignant neoplasm of upper-outer quadrant of left female breast: Secondary | ICD-10-CM

## 2017-11-02 DIAGNOSIS — Z170 Estrogen receptor positive status [ER+]: Secondary | ICD-10-CM

## 2017-11-02 DIAGNOSIS — Z5112 Encounter for antineoplastic immunotherapy: Secondary | ICD-10-CM | POA: Diagnosis not present

## 2017-11-02 DIAGNOSIS — Z5111 Encounter for antineoplastic chemotherapy: Secondary | ICD-10-CM | POA: Diagnosis not present

## 2017-11-02 LAB — CBC WITH DIFFERENTIAL (CANCER CENTER ONLY)
Abs Immature Granulocytes: 0.01 10*3/uL (ref 0.00–0.07)
Basophils Absolute: 0 10*3/uL (ref 0.0–0.1)
Basophils Relative: 0 %
EOS PCT: 3 %
Eosinophils Absolute: 0.1 10*3/uL (ref 0.0–0.5)
HEMATOCRIT: 40.3 % (ref 36.0–46.0)
Hemoglobin: 13.4 g/dL (ref 12.0–15.0)
Immature Granulocytes: 0 %
LYMPHS ABS: 1.2 10*3/uL (ref 0.7–4.0)
Lymphocytes Relative: 22 %
MCH: 30.9 pg (ref 26.0–34.0)
MCHC: 33.3 g/dL (ref 30.0–36.0)
MCV: 93.1 fL (ref 80.0–100.0)
MONOS PCT: 12 %
Monocytes Absolute: 0.6 10*3/uL (ref 0.1–1.0)
Neutro Abs: 3.3 10*3/uL (ref 1.7–7.7)
Neutrophils Relative %: 63 %
Platelet Count: 155 10*3/uL (ref 150–400)
RBC: 4.33 MIL/uL (ref 3.87–5.11)
RDW: 13 % (ref 11.5–15.5)
WBC Count: 5.3 10*3/uL (ref 4.0–10.5)
nRBC: 0 % (ref 0.0–0.2)

## 2017-11-02 LAB — CMP (CANCER CENTER ONLY)
ALBUMIN: 3.8 g/dL (ref 3.5–5.0)
ALK PHOS: 80 U/L (ref 38–126)
ALT: 19 U/L (ref 0–44)
AST: 16 U/L (ref 15–41)
Anion gap: 10 (ref 5–15)
BUN: 19 mg/dL (ref 8–23)
CALCIUM: 9.5 mg/dL (ref 8.9–10.3)
CO2: 27 mmol/L (ref 22–32)
CREATININE: 0.79 mg/dL (ref 0.44–1.00)
Chloride: 107 mmol/L (ref 98–111)
GFR, Est AFR Am: >60 mL/min (ref >60)
GFR, Estimated: >60 mL/min (ref >60)
GLUCOSE: 96 mg/dL (ref 70–99)
Potassium: 3.7 mmol/L (ref 3.5–5.1)
Sodium: 144 mmol/L (ref 135–145)
Total Bilirubin: 0.5 mg/dL (ref 0.3–1.2)
Total Protein: 6.8 g/dL (ref 6.5–8.1)

## 2017-11-02 LAB — RESEARCH LABS

## 2017-11-02 MED ORDER — DIPHENHYDRAMINE HCL 25 MG PO CAPS
ORAL_CAPSULE | ORAL | Status: AC
  Filled 2017-11-02: qty 2

## 2017-11-02 MED ORDER — TRASTUZUMAB CHEMO 150 MG IV SOLR: 600.0000 mg | Freq: Once | INTRAVENOUS | Status: DC

## 2017-11-02 MED ORDER — ACETAMINOPHEN 325 MG PO TABS
ORAL_TABLET | ORAL | Status: AC
  Filled 2017-11-02: qty 2

## 2017-11-02 MED ORDER — FAMOTIDINE IN NACL 20-0.9 MG/50ML-% IV SOLN
20.0000 mg | Freq: Once | INTRAVENOUS | Status: AC
  Administered 2017-11-02: 20 mg via INTRAVENOUS

## 2017-11-02 MED ORDER — ACETAMINOPHEN 325 MG PO TABS
650.0000 mg | ORAL_TABLET | Freq: Once | ORAL | Status: AC
  Administered 2017-11-02: 650 mg via ORAL

## 2017-11-02 MED ORDER — TRASTUZUMAB CHEMO 150 MG IV SOLR
4.0000 mg/kg | Freq: Once | INTRAVENOUS | Status: AC
  Administered 2017-11-02: 399 mg via INTRAVENOUS
  Filled 2017-11-02: qty 19

## 2017-11-02 MED ORDER — HEPARIN SOD (PORK) LOCK FLUSH 100 UNIT/ML IV SOLN
500.0000 [IU] | Freq: Once | INTRAVENOUS | Status: AC | PRN
  Administered 2017-11-02: 500 [IU]
  Filled 2017-11-02: qty 5

## 2017-11-02 MED ORDER — DIPHENHYDRAMINE HCL 50 MG/ML IJ SOLN: 25.0000 mg | Freq: Once | INTRAMUSCULAR | Status: DC

## 2017-11-02 MED ORDER — SODIUM CHLORIDE 0.9 % IV SOLN
Freq: Once | INTRAVENOUS | Status: AC
  Administered 2017-11-02: 11:00:00 via INTRAVENOUS
  Filled 2017-11-02: qty 250

## 2017-11-02 MED ORDER — SODIUM CHLORIDE 0.9 % IV SOLN
80.0000 mg/m2 | Freq: Once | INTRAVENOUS | Status: AC
  Administered 2017-11-02: 168 mg via INTRAVENOUS
  Filled 2017-11-02: qty 28

## 2017-11-02 MED ORDER — SODIUM CHLORIDE 0.9% FLUSH
10.0000 mL | INTRAVENOUS | Status: DC | PRN
  Administered 2017-11-02: 10 mL via INTRAVENOUS
  Filled 2017-11-02: qty 10

## 2017-11-02 MED ORDER — DIPHENHYDRAMINE HCL 25 MG PO CAPS
50.0000 mg | ORAL_CAPSULE | Freq: Once | ORAL | Status: AC
  Administered 2017-11-02: 50 mg via ORAL

## 2017-11-02 MED ORDER — DEXAMETHASONE SODIUM PHOSPHATE 10 MG/ML IJ SOLN
10.0000 mg | Freq: Once | INTRAMUSCULAR | Status: AC
  Administered 2017-11-02: 10 mg via INTRAVENOUS

## 2017-11-02 MED ORDER — DEXAMETHASONE SODIUM PHOSPHATE 10 MG/ML IJ SOLN
INTRAMUSCULAR | Status: AC
  Filled 2017-11-02: qty 1

## 2017-11-02 MED ORDER — FAMOTIDINE IN NACL 20-0.9 MG/50ML-% IV SOLN
INTRAVENOUS | Status: AC
  Filled 2017-11-02: qty 50

## 2017-11-02 MED ORDER — SODIUM CHLORIDE 0.9% FLUSH
10.0000 mL | INTRAVENOUS | Status: DC | PRN
  Administered 2017-11-02: 10 mL
  Filled 2017-11-02: qty 10

## 2017-11-02 NOTE — Progress Notes (Signed)
Patient Care Team: Manon Hilding, MD as PCP - General (Unknown Physician Specialty) Excell Seltzer, MD as Consulting Physician (General Surgery) Nicholas Lose, MD as Consulting Physician (Hematology and Oncology) Gery Pray, MD as Consulting Physician (Radiation Oncology)  DIAGNOSIS:  Encounter Diagnosis  Name Primary?  . Malignant neoplasm of upper-outer quadrant of left breast in female, estrogen receptor positive ([place])     SUMMARY OF ONCOLOGIC HISTORY:   Malignant neoplasm of upper-outer quadrant of left breast in female, estrogen receptor positive ([place])   08/05/2017 Initial Diagnosis    Screening detected left breast calcifications UOQ 1.1 cm biopsy revealed invasive lobular cancer with LCIS and CSL, grade 2, ER 90%, PR 0%, Ki-67 1%, HER-2 positive ratio 2.62, T2N0 stage Ia clinical stage AJCC 8    08/12/2017 Cancer Staging    Staging form: Breast, AJCC 8th Edition - Clinical stage from 08/12/2017: Stage IA (cT1c, cN0, cM0, G2, ER+, PR-, HER2+) - Signed by Nicholas Lose, MD on 08/12/2017    09/11/2017 Surgery    Left lumpectomy: Grade 2 invasive lobular cancer, 1.1 cm, LCIS, margins negative, 0/2 lymph nodes negative, ER 90% strong staining, PR negative, HER-2 positive ratio 2.62, Ki-67 1%, T1c N0 stage Ia    09/23/2017 Cancer Staging    Staging form: Breast, AJCC 8th Edition - Pathologic: Stage IA (pT1c, pN0, cM0, G2, ER+, PR-, HER2+) - Signed by Gardenia Phlegm, NP on 09/23/2017    10/15/2017 -  Chemotherapy    The patient had trastuzumab (HERCEPTIN) 588 mg in sodium chloride 0.9 % 250 mL chemo infusion, 6 mg/kg = 588 mg, Intravenous,  Once, 0 of 6 cycles PACLitaxel (TAXOL) 168 mg in sodium chloride 0.9 % 250 mL chemo infusion (</= 41m/m2), 80 mg/m2 = 168 mg, Intravenous,  Once, 0 of 6 cycles  for chemotherapy treatment.      CHIEF COMPLIANT: Cycle 1 day 1 Taxol Herceptin  INTERVAL HISTORY: PMAHAGONY GRIEBis a 72year old with above-mentioned history of  left breast cancer underwent lumpectomy and is here today to begin her first treatment with Taxol and Herceptin.  She is anxious to start treatment today.  REVIEW OF SYSTEMS:   Constitutional: Denies fevers, chills or abnormal weight loss Eyes: Denies blurriness of vision Ears, nose, mouth, throat, and face: Denies mucositis or sore throat Respiratory: Denies cough, dyspnea or wheezes Cardiovascular: Denies palpitation, chest discomfort Gastrointestinal:  Denies nausea, heartburn or change in bowel habits Skin: Denies abnormal skin rashes Lymphatics: Denies new lymphadenopathy or easy bruising Neurological:Denies numbness, tingling or new weaknesses Behavioral/Psych: Anxiety for starting treatment today Extremities: No lower extremity edema   All other systems were reviewed with the patient and are negative.  I have reviewed the past medical history, past surgical history, social history and family history with the patient and they are unchanged from previous note.  ALLERGIES:  is allergic to fiorinal-codeine #3 [butalbital-asa-caff-codeine]; lansoprazole; latex; butalbital-aspirin-caffeine; sulfa antibiotics; femara [letrozole]; and sulfasalazine.  MEDICATIONS:  Current Outpatient Medications  Medication Sig Dispense Refill  . atorvastatin (LIPITOR) 10 MG tablet Take 10 mg by mouth at bedtime.    . Calcium Carb-Cholecalciferol (CALCIUM 600+D) 600-800 MG-UNIT TABS Take 1 tablet by mouth daily.     . cyclobenzaprine (FLEXERIL) 10 MG tablet Take 10 mg by mouth 3 (three) times daily as needed for muscle spasms.     .Marland Kitchendicyclomine (BENTYL) 10 MG capsule Take 1 capsule (10 mg total) by mouth 3 (three) times daily as needed for spasms. Patient states that she started  this 3 weeks ago (Patient taking differently: Take 10 mg by mouth daily. ) 270 capsule 1  . escitalopram (LEXAPRO) 5 MG tablet Take 5 mg by mouth daily.    . hydrochlorothiazide (HYDRODIURIL) 25 MG tablet Take 25 mg by mouth daily.     . hydroxypropyl methylcellulose / hypromellose (ISOPTO TEARS / GONIOVISC) 2.5 % ophthalmic solution Place 1 drop into both eyes 3 (three) times daily as needed for dry eyes.    Marland Kitchen lidocaine-prilocaine (EMLA) cream Apply to affected area once 30 g 3  . loperamide (IMODIUM) 2 MG capsule Take 2 mg by mouth daily before breakfast.    . LORazepam (ATIVAN) 0.5 MG tablet Take 1 tablet (0.5 mg total) by mouth at bedtime as needed for sleep. 30 tablet 0  . meloxicam (MOBIC) 7.5 MG tablet Take 7.5 mg by mouth 2 (two) times daily as needed for pain.     . Menthol, Topical Analgesic, (ICY HOT) 5 % PADS Apply 1 patch topically daily as needed (pain).     . ondansetron (ZOFRAN) 8 MG tablet Take 1 tablet (8 mg total) by mouth 2 (two) times daily as needed (Nausea or vomiting). 30 tablet 1  . oxyCODONE (OXY IR/ROXICODONE) 5 MG immediate release tablet Take 1 tablet (5 mg total) by mouth every 6 (six) hours as needed for severe pain. 10 tablet 0  . prochlorperazine (COMPAZINE) 10 MG tablet Take 1 tablet (10 mg total) by mouth every 6 (six) hours as needed (Nausea or vomiting). 30 tablet 1   No current facility-administered medications for this visit.     PHYSICAL EXAMINATION: ECOG PERFORMANCE STATUS: 1 - Symptomatic but completely ambulatory  Vitals:   11/02/17 0941  BP: (!) 188/71  Pulse: 72  Resp: 17  Temp: 98 F (36.7 C)  SpO2: 99%   Filed Weights   11/02/17 0941  Weight: 219 lb 14.4 oz (99.7 kg)    GENERAL:alert, no distress and comfortable SKIN: skin color, texture, turgor are normal, no rashes or significant lesions EYES: normal, Conjunctiva are pink and non-injected, sclera clear OROPHARYNX:no exudate, no erythema and lips, buccal mucosa, and tongue normal  NECK: supple, thyroid normal size, non-tender, without nodularity LYMPH:  no palpable lymphadenopathy in the cervical, axillary or inguinal LUNGS: clear to auscultation and percussion with normal breathing effort HEART: regular rate  & rhythm and no murmurs and no lower extremity edema ABDOMEN:abdomen soft, non-tender and normal bowel sounds MUSCULOSKELETAL:no cyanosis of digits and no clubbing  NEURO: alert & oriented x 3 with fluent speech, no focal motor/sensory deficits EXTREMITIES: No lower extremity edema    LABORATORY DATA:  I have reviewed the data as listed CMP Latest Ref Rng & Units 09/03/2017 08/12/2017 08/12/2013  Glucose 70 - 99 mg/dL 105(H) 115(H) 118(H)  BUN 8 - 23 mg/dL 24(H) 21 21  Creatinine 0.44 - 1.00 mg/dL 0.70 0.85 0.82  Sodium 135 - 145 mmol/L 141 142 145  Potassium 3.5 - 5.1 mmol/L 3.5 3.3(L) 3.8  Chloride 98 - 111 mmol/L 109 108 103  CO2 22 - 32 mmol/L 26 25 32  Calcium 8.9 - 10.3 mg/dL 9.2 9.4 9.4  Total Protein 6.5 - 8.1 g/dL - 6.7 -  Total Bilirubin 0.3 - 1.2 mg/dL - 0.6 -  Alkaline Phos 38 - 126 U/L - 81 -  AST 15 - 41 U/L - 14(L) -  ALT 0 - 44 U/L - 19 -    Lab Results  Component Value Date   WBC 5.3 11/02/2017  HGB 13.4 11/02/2017   HCT 40.3 11/02/2017   MCV 93.1 11/02/2017   PLT 155 11/02/2017   NEUTROABS 3.3 11/02/2017    ASSESSMENT & PLAN:  Malignant neoplasm of upper-outer quadrant of left breast in female, estrogen receptor positive ([place]) 09/11/2017:Left lumpectomy: Grade 2 invasive lobular cancer, 1.1 cm, LCIS, margins negative, 0/2 lymph nodes negative, ER 90% strong staining, PR negative, HER-2 positive ratio 2.62, Ki-67 1%, T1c N0 stage Ia  Pathology counseling: I discussed the final pathology report of the patient provided  a copy of this report. I discussed the margins as well as lymph node surgeries. We also discussed the final staging along with previously performed ER/PR and HER-2/neu testing.  Recommendation: 1. Adjuvant therapy with Taxol Herceptin weekly x12 followed by Herceptin maintenance for 1 year 2.Adjuvant radiation  3.Followed by adjuvant antiestrogen therapy ------------------------------------------------------------------ Current  treatment: Cycle 1 day 1 Taxol Herceptin Antiemetics were reviewed Labs were reviewed Chemo counseling and education completed Echocardiogram: 08/17/2017 EF 55 to 60%  Return to clinic 1 week for toxicity check    No orders of the defined types were placed in this encounter.  The patient has a good understanding of the overall plan. she agrees with it. she will call with any problems that may develop before the next visit here.   Harriette Ohara, MD 11/02/17

## 2017-11-02 NOTE — Progress Notes (Signed)
[facility] [id number] Study: Baseline Activities (con't): 11/02/17 @ 1100: Research nurse met with patient in infusion area today and confirmed she was fasting as required for her baseline labs today that were drawn at 0913 from her port prior to chemotherapy administration. She was provided a Geophysical data processor gift card (# [phone]) for completion of her baseline activities and she initialed receipt. Informed her that her next study lab will be in 1 month (11/30/17) and she does not require fasting for these. Informed her that this research nurse will no longer be following her and that her new research nurse is Doreatha Martin and her business card was provided. Informed her that [place] on introducing herself to her later today. Research nurse thanked her for her continued willingness to participate in the UPBEAT study. Mauri Reading Michaelle Copas Therapist, sports, BSN Clinical Research Nurse 11/02/17 @ [phone]

## 2017-11-02 NOTE — Assessment & Plan Note (Signed)
09/11/2017:Left lumpectomy: Grade 2 invasive lobular cancer, 1.1 cm, LCIS, margins negative, 0/2 lymph nodes negative, ER 90% strong staining, PR negative, HER-2 positive ratio 2.62, Ki-67 1%, T1c N0 stage Ia  Pathology counseling: I discussed the final pathology report of the patient provided  a copy of this report. I discussed the margins as well as lymph node surgeries. We also discussed the final staging along with previously performed ER/PR and HER-2/neu testing.  Recommendation: 1. Adjuvant therapy with Taxol Herceptin weekly x12 followed by Herceptin maintenance for 1 year 2.Adjuvant radiation  3.Followed by adjuvant antiestrogen therapy ------------------------------------------------------------------ Current treatment: Cycle 1 day 1 Taxol Herceptin Antiemetics were reviewed Labs were reviewed Chemo counseling and education completed Echocardiogram: 08/17/2017 EF 55 to 60%  Return to clinic 1 week for toxicity check

## 2017-11-02 NOTE — Progress Notes (Signed)
Went to infusion to introduce myself as Arboriculturist and to give my card for any financial questions or concerns.  Discussed the one-time [place] and advise to contact me if interested to apply. She verbalized understanding.

## 2017-11-02 NOTE — Progress Notes (Signed)
Load at 4 mg/kg for weekly dosing of Herceptin.  Clarified w/ MD.  Dose changed. Kennith Center, Pharm.D., CPP 11/02/2017@12 :04 PM

## 2017-11-02 NOTE — Patient Instructions (Signed)
[facility] Discharge Instructions for Patients Receiving Chemotherapy  Today you received the following chemotherapy agents Herceptin and Taxol  To help prevent nausea and vomiting after your treatment, we encourage you to take your nausea medication as directed.    If you develop nausea and vomiting that is not controlled by your nausea medication, call the clinic.   BELOW ARE SYMPTOMS THAT SHOULD BE REPORTED IMMEDIATELY:  *FEVER GREATER THAN 100.5 F  *CHILLS WITH OR WITHOUT FEVER  NAUSEA AND VOMITING THAT IS NOT CONTROLLED WITH YOUR NAUSEA MEDICATION  *UNUSUAL SHORTNESS OF BREATH  *UNUSUAL BRUISING OR BLEEDING  TENDERNESS IN MOUTH AND THROAT WITH OR WITHOUT PRESENCE OF ULCERS  *URINARY PROBLEMS  *BOWEL PROBLEMS  UNUSUAL RASH Items with * indicate a potential emergency and should be followed up as soon as possible.  Feel free to call the clinic should you have any questions or concerns. The clinic phone number is [phone].  Please show the [facility] at check-in to the Emergency Department and triage nurse.   Trastuzumab injection for infusion What is this medicine? TRASTUZUMAB (tras TOO zoo mab) is a monoclonal antibody. It is used to treat breast cancer and stomach cancer. This medicine may be used for other purposes; ask your health care provider or pharmacist if you have questions. COMMON BRAND NAME(S): Herceptin What should I tell my health care provider before I take this medicine? They need to know if you have any of these conditions: -heart disease -heart failure -lung or breathing disease, like asthma -an unusual or allergic reaction to trastuzumab, benzyl alcohol, or other medications, foods, dyes, or preservatives -pregnant or trying to get pregnant -breast-feeding How should I use this medicine? This drug is given as an infusion into a vein. It is administered in a hospital or clinic by a specially trained health care  professional. Talk to your pediatrician regarding the use of this medicine in children. This medicine is not approved for use in children. Overdosage: If you think you have taken too much of this medicine contact a poison control center or emergency room at once. NOTE: This medicine is only for you. Do not share this medicine with others. What if I miss a dose? It is important not to miss a dose. Call your doctor or health care professional if you are unable to keep an appointment. What may interact with this medicine? This medicine may interact with the following medications: -certain types of chemotherapy, such as daunorubicin, doxorubicin, epirubicin, and idarubicin This list may not describe all possible interactions. Give your health care provider a list of all the medicines, herbs, non-prescription drugs, or dietary supplements you use. Also tell them if you smoke, drink alcohol, or use illegal drugs. Some items may interact with your medicine. What should I watch for while using this medicine? Visit your doctor for checks on your progress. Report any side effects. Continue your course of treatment even though you feel ill unless your doctor tells you to stop. Call your doctor or health care professional for advice if you get a fever, chills or sore throat, or other symptoms of a cold or flu. Do not treat yourself. Try to avoid being around people who are sick. You may experience fever, chills and shaking during your first infusion. These effects are usually mild and can be treated with other medicines. Report any side effects during the infusion to your health care professional. Fever and chills usually do not happen with later infusions. Do  not become pregnant while taking this medicine or for 7 months after stopping it. Women should inform their doctor if they wish to become pregnant or think they might be pregnant. Women of child-bearing potential will need to have a negative pregnancy test  before starting this medicine. There is a potential for serious side effects to an unborn child. Talk to your health care professional or pharmacist for more information. Do not breast-feed an infant while taking this medicine or for 7 months after stopping it. Women must use effective birth control with this medicine. What side effects may I notice from receiving this medicine? Side effects that you should report to your doctor or health care professional as soon as possible: -allergic reactions like skin rash, itching or hives, swelling of the face, lips, or tongue -chest pain or palpitations -cough -dizziness -feeling faint or lightheaded, falls -fever -general ill feeling or flu-like symptoms -signs of worsening heart failure like breathing problems; swelling in your legs and feet -unusually weak or tired Side effects that usually do not require medical attention (report to your doctor or health care professional if they continue or are bothersome): -bone pain -changes in taste -diarrhea -joint pain -nausea/vomiting -weight loss This list may not describe all possible side effects. Call your doctor for medical advice about side effects. You may report side effects to FDA at 1-800-FDA-1088. Where should I keep my medicine? This drug is given in a hospital or clinic and will not be stored at home. NOTE: This sheet is a summary. It may not cover all possible information. If you have questions about this medicine, talk to your doctor, pharmacist, or health care provider.  2018 Elsevier/Gold Standard (2015-12-25 14:37:52)  Paclitaxel injection What is this medicine? PACLITAXEL (PAK li TAX el) is a chemotherapy drug. It targets fast dividing cells, like cancer cells, and causes these cells to die. This medicine is used to treat ovarian cancer, breast cancer, and other cancers. This medicine may be used for other purposes; ask your health care provider or pharmacist if you have  questions. COMMON BRAND NAME(S): Onxol, Taxol What should I tell my health care provider before I take this medicine? They need to know if you have any of these conditions: -blood disorders -irregular heartbeat -infection (especially a virus infection such as chickenpox, cold sores, or herpes) -liver disease -previous or ongoing radiation therapy -an unusual or allergic reaction to paclitaxel, alcohol, polyoxyethylated castor oil, other chemotherapy agents, other medicines, foods, dyes, or preservatives -pregnant or trying to get pregnant -breast-feeding How should I use this medicine? This drug is given as an infusion into a vein. It is administered in a hospital or clinic by a specially trained health care professional. Talk to your pediatrician regarding the use of this medicine in children. Special care may be needed. Overdosage: If you think you have taken too much of this medicine contact a poison control center or emergency room at once. NOTE: This medicine is only for you. Do not share this medicine with others. What if I miss a dose? It is important not to miss your dose. Call your doctor or health care professional if you are unable to keep an appointment. What may interact with this medicine? Do not take this medicine with any of the following medications: -disulfiram -metronidazole This medicine may also interact with the following medications: -cyclosporine -diazepam -ketoconazole -medicines to increase blood counts like filgrastim, pegfilgrastim, sargramostim -other chemotherapy drugs like cisplatin, doxorubicin, epirubicin, etoposide, teniposide, vincristine -quinidine -testosterone -vaccines -  verapamil Talk to your doctor or health care professional before taking any of these medicines: -acetaminophen -aspirin -ibuprofen -ketoprofen -naproxen This list may not describe all possible interactions. Give your health care provider a list of all the medicines, herbs,  non-prescription drugs, or dietary supplements you use. Also tell them if you smoke, drink alcohol, or use illegal drugs. Some items may interact with your medicine. What should I watch for while using this medicine? Your condition will be monitored carefully while you are receiving this medicine. You will need important blood work done while you are taking this medicine. This medicine can cause serious allergic reactions. To reduce your risk you will need to take other medicine(s) before treatment with this medicine. If you experience allergic reactions like skin rash, itching or hives, swelling of the face, lips, or tongue, tell your doctor or health care professional right away. In some cases, you may be given additional medicines to help with side effects. Follow all directions for their use. This drug may make you feel generally unwell. This is not uncommon, as chemotherapy can affect healthy cells as well as cancer cells. Report any side effects. Continue your course of treatment even though you feel ill unless your doctor tells you to stop. Call your doctor or health care professional for advice if you get a fever, chills or sore throat, or other symptoms of a cold or flu. Do not treat yourself. This drug decreases your body's ability to fight infections. Try to avoid being around people who are sick. This medicine may increase your risk to bruise or bleed. Call your doctor or health care professional if you notice any unusual bleeding. Be careful brushing and flossing your teeth or using a toothpick because you may get an infection or bleed more easily. If you have any dental work done, tell your dentist you are receiving this medicine. Avoid taking products that contain aspirin, acetaminophen, ibuprofen, naproxen, or ketoprofen unless instructed by your doctor. These medicines may hide a fever. Do not become pregnant while taking this medicine. Women should inform their doctor if they wish to  become pregnant or think they might be pregnant. There is a potential for serious side effects to an unborn child. Talk to your health care professional or pharmacist for more information. Do not breast-feed an infant while taking this medicine. Men are advised not to father a child while receiving this medicine. This product may contain alcohol. Ask your pharmacist or healthcare provider if this medicine contains alcohol. Be sure to tell all healthcare providers you are taking this medicine. Certain medicines, like metronidazole and disulfiram, can cause an unpleasant reaction when taken with alcohol. The reaction includes flushing, headache, nausea, vomiting, sweating, and increased thirst. The reaction can last from 30 minutes to several hours. What side effects may I notice from receiving this medicine? Side effects that you should report to your doctor or health care professional as soon as possible: -allergic reactions like skin rash, itching or hives, swelling of the face, lips, or tongue -low blood counts - This drug may decrease the number of white blood cells, red blood cells and platelets. You may be at increased risk for infections and bleeding. -signs of infection - fever or chills, cough, sore throat, pain or difficulty passing urine -signs of decreased platelets or bleeding - bruising, pinpoint red spots on the skin, black, tarry stools, nosebleeds -signs of decreased red blood cells - unusually weak or tired, fainting spells, lightheadedness -breathing problems -chest pain -  high or low blood pressure -mouth sores -nausea and vomiting -pain, swelling, redness or irritation at the injection site -pain, tingling, numbness in the hands or feet -slow or irregular heartbeat -swelling of the ankle, feet, hands Side effects that usually do not require medical attention (report to your doctor or health care professional if they continue or are bothersome): -bone pain -complete hair loss  including hair on your head, underarms, pubic hair, eyebrows, and eyelashes -changes in the color of fingernails -diarrhea -loosening of the fingernails -loss of appetite -muscle or joint pain -red flush to skin -sweating This list may not describe all possible side effects. Call your doctor for medical advice about side effects. You may report side effects to FDA at 1-800-FDA-1088. Where should I keep my medicine? This drug is given in a hospital or clinic and will not be stored at home. NOTE: This sheet is a summary. It may not cover all possible information. If you have questions about this medicine, talk to your doctor, pharmacist, or health care provider.  2018 Elsevier/Gold Standard (2014-10-31 19:58:00)

## 2017-11-03 ENCOUNTER — Telehealth: Payer: Self-pay

## 2017-11-03 NOTE — Telephone Encounter (Signed)
Called patient to follow up after first treatment of Taxol and Herceptin on 11/02/2017.  Left voicemail for patient to return call.

## 2017-11-03 NOTE — Telephone Encounter (Signed)
Patient returned call following up from first chemotherapy treatment.  Patient with no c/o nausea, fever, chills, or rash.  Patient stated her energy level is great right now.  Patient educated on s/s of when to call if things develop. No further needs at this time.

## 2017-11-05 NOTE — Progress Notes (Signed)
PA for Ondansetron has been submitted.

## 2017-11-06 ENCOUNTER — Telehealth: Payer: Self-pay

## 2017-11-06 NOTE — Telephone Encounter (Signed)
Called pt to return her vm regarding taking an antibiotic for her tooth infection. She is currently on taxol/ herceptin and would like to know if it was safe for her to take the antibiotic. LVM and call back number to discuss this further. Per Dr.Gudena, okay to take antibiotic.

## 2017-11-09 ENCOUNTER — Inpatient Hospital Stay (HOSPITAL_BASED_OUTPATIENT_CLINIC_OR_DEPARTMENT_OTHER): Payer: Medicare Other | Admitting: Hematology and Oncology

## 2017-11-09 ENCOUNTER — Inpatient Hospital Stay: Payer: Medicare Other

## 2017-11-09 ENCOUNTER — Telehealth: Payer: Self-pay | Admitting: Hematology and Oncology

## 2017-11-09 DIAGNOSIS — C50412 Malignant neoplasm of upper-outer quadrant of left female breast: Secondary | ICD-10-CM | POA: Diagnosis not present

## 2017-11-09 DIAGNOSIS — Z170 Estrogen receptor positive status [ER+]: Principal | ICD-10-CM

## 2017-11-09 DIAGNOSIS — R197 Diarrhea, unspecified: Secondary | ICD-10-CM

## 2017-11-09 DIAGNOSIS — Z95828 Presence of other vascular implants and grafts: Secondary | ICD-10-CM

## 2017-11-09 DIAGNOSIS — R5383 Other fatigue: Secondary | ICD-10-CM

## 2017-11-09 DIAGNOSIS — Z5111 Encounter for antineoplastic chemotherapy: Secondary | ICD-10-CM | POA: Diagnosis not present

## 2017-11-09 DIAGNOSIS — Z5112 Encounter for antineoplastic immunotherapy: Secondary | ICD-10-CM | POA: Diagnosis not present

## 2017-11-09 LAB — CMP (CANCER CENTER ONLY)
ALT: 23 U/L (ref 0–44)
ANION GAP: 8 (ref 5–15)
AST: 15 U/L (ref 15–41)
Albumin: 3.6 g/dL (ref 3.5–5.0)
Alkaline Phosphatase: 73 U/L (ref 38–126)
BUN: 18 mg/dL (ref 8–23)
CO2: 28 mmol/L (ref 22–32)
CREATININE: 0.76 mg/dL (ref 0.44–1.00)
Calcium: 9.3 mg/dL (ref 8.9–10.3)
Chloride: 108 mmol/L (ref 98–111)
Glucose, Bld: 96 mg/dL (ref 70–99)
Potassium: 3.8 mmol/L (ref 3.5–5.1)
Sodium: 144 mmol/L (ref 135–145)
Total Bilirubin: 0.6 mg/dL (ref 0.3–1.2)
Total Protein: 6.5 g/dL (ref 6.5–8.1)

## 2017-11-09 LAB — CBC WITH DIFFERENTIAL (CANCER CENTER ONLY)
Abs Immature Granulocytes: 0.02 10*3/uL (ref 0.00–0.07)
BASOS ABS: 0 10*3/uL (ref 0.0–0.1)
Basophils Relative: 0 %
EOS ABS: 0.1 10*3/uL (ref 0.0–0.5)
EOS PCT: 3 %
HCT: 38.5 % (ref 36.0–46.0)
Hemoglobin: 12.6 g/dL (ref 12.0–15.0)
IMMATURE GRANULOCYTES: 1 %
Lymphocytes Relative: 27 %
Lymphs Abs: 1.1 10*3/uL (ref 0.7–4.0)
MCH: 30.8 pg (ref 26.0–34.0)
MCHC: 32.7 g/dL (ref 30.0–36.0)
MCV: 94.1 fL (ref 80.0–100.0)
Monocytes Absolute: 0.3 10*3/uL (ref 0.1–1.0)
Monocytes Relative: 7 %
NEUTROS PCT: 62 %
NRBC: 0 % (ref 0.0–0.2)
Neutro Abs: 2.4 10*3/uL (ref 1.7–7.7)
Platelet Count: 168 10*3/uL (ref 150–400)
RBC: 4.09 MIL/uL (ref 3.87–5.11)
RDW: 12.7 % (ref 11.5–15.5)
WBC: 3.9 10*3/uL — AB (ref 4.0–10.5)

## 2017-11-09 MED ORDER — SODIUM CHLORIDE 0.9% FLUSH
10.0000 mL | INTRAVENOUS | Status: DC | PRN
  Administered 2017-11-09: 10 mL via INTRAVENOUS
  Filled 2017-11-09: qty 10

## 2017-11-09 MED ORDER — FAMOTIDINE IN NACL 20-0.9 MG/50ML-% IV SOLN
20.0000 mg | Freq: Once | INTRAVENOUS | Status: AC
  Administered 2017-11-09: 20 mg via INTRAVENOUS

## 2017-11-09 MED ORDER — DIPHENHYDRAMINE HCL 50 MG/ML IJ SOLN
25.0000 mg | Freq: Once | INTRAMUSCULAR | Status: AC
  Administered 2017-11-09: 25 mg via INTRAVENOUS

## 2017-11-09 MED ORDER — HEPARIN SOD (PORK) LOCK FLUSH 100 UNIT/ML IV SOLN
500.0000 [IU] | Freq: Once | INTRAVENOUS | Status: AC | PRN
  Administered 2017-11-09: 500 [IU]
  Filled 2017-11-09: qty 5

## 2017-11-09 MED ORDER — DIPHENHYDRAMINE HCL 50 MG/ML IJ SOLN
INTRAMUSCULAR | Status: AC
  Filled 2017-11-09: qty 1

## 2017-11-09 MED ORDER — DEXAMETHASONE SODIUM PHOSPHATE 10 MG/ML IJ SOLN
10.0000 mg | Freq: Once | INTRAMUSCULAR | Status: AC
  Administered 2017-11-09: 10 mg via INTRAVENOUS

## 2017-11-09 MED ORDER — DEXAMETHASONE SODIUM PHOSPHATE 10 MG/ML IJ SOLN
INTRAMUSCULAR | Status: AC
  Filled 2017-11-09: qty 1

## 2017-11-09 MED ORDER — FAMOTIDINE IN NACL 20-0.9 MG/50ML-% IV SOLN
INTRAVENOUS | Status: AC
  Filled 2017-11-09: qty 50

## 2017-11-09 MED ORDER — SODIUM CHLORIDE 0.9 % IV SOLN
80.0000 mg/m2 | Freq: Once | INTRAVENOUS | Status: AC
  Administered 2017-11-09: 168 mg via INTRAVENOUS
  Filled 2017-11-09: qty 28

## 2017-11-09 MED ORDER — SODIUM CHLORIDE 0.9% FLUSH
10.0000 mL | INTRAVENOUS | Status: DC | PRN
  Administered 2017-11-09: 10 mL
  Filled 2017-11-09: qty 10

## 2017-11-09 MED ORDER — SODIUM CHLORIDE 0.9 % IV SOLN
Freq: Once | INTRAVENOUS | Status: AC
  Administered 2017-11-09: 10:00:00 via INTRAVENOUS
  Filled 2017-11-09: qty 250

## 2017-11-09 MED ORDER — TRASTUZUMAB CHEMO 150 MG IV SOLR
2.0000 mg/kg | Freq: Once | INTRAVENOUS | Status: AC
  Administered 2017-11-09: 189 mg via INTRAVENOUS
  Filled 2017-11-09: qty 9

## 2017-11-09 NOTE — Assessment & Plan Note (Addendum)
09/11/2017:Left lumpectomy: Grade 2 invasive lobular cancer, 1.1 cm, LCIS, margins negative, 0/2 lymph nodes negative, ER 90% strong staining, PR negative, HER-2 positive ratio 2.62, Ki-67 1%, T1c N0 stage Ia  Pathology counseling: I discussed the final pathology report of the patient provided a copy of this report. I discussed the margins as well as lymph node surgeries. We also discussed the final staging along with previously performed ER/PR and HER-2/neu testing.  Recommendation: 1.Adjuvant therapy with Taxol Herceptin weekly x12 followed by Herceptin maintenance for 1 year 2.Adjuvant radiation 3.Followed by adjuvant antiestrogen therapy ------------------------------------------------------------------ Current treatment: Cycle 2 day 1 Taxol Herceptin Antiemetics were reviewed Labs were reviewed Chemo counseling and education completed Echocardiogram: 08/17/2017 EF 55 to 60%  Return to clinic weekly for chemo and every 2 weeks for follow-up with me

## 2017-11-09 NOTE — Telephone Encounter (Signed)
No 10/28 los orders.

## 2017-11-09 NOTE — Progress Notes (Signed)
Patient Care Team: Manon Hilding, MD as PCP - General (Unknown Physician Specialty) Excell Seltzer, MD as Consulting Physician (General Surgery) Nicholas Lose, MD as Consulting Physician (Hematology and Oncology) Gery Pray, MD as Consulting Physician (Radiation Oncology)  DIAGNOSIS:  Encounter Diagnosis  Name Primary?  . Malignant neoplasm of upper-outer quadrant of left breast in female, estrogen receptor positive ([place])     SUMMARY OF ONCOLOGIC HISTORY:   Malignant neoplasm of upper-outer quadrant of left breast in female, estrogen receptor positive ([facility])   08/05/2017 Initial Diagnosis    Screening detected left breast calcifications UOQ 1.1 cm biopsy revealed invasive lobular cancer with LCIS and CSL, grade 2, ER 90%, PR 0%, Ki-67 1%, HER-2 positive ratio 2.62, T2N0 stage Ia clinical stage AJCC 8    08/12/2017 Cancer Staging    Staging form: Breast, AJCC 8th Edition - Clinical stage from 08/12/2017: Stage IA (cT1c, cN0, cM0, G2, ER+, PR-, HER2+) - Signed by Nicholas Lose, MD on 08/12/2017    09/11/2017 Surgery    Left lumpectomy: Grade 2 invasive lobular cancer, 1.1 cm, LCIS, margins negative, 0/2 lymph nodes negative, ER 90% strong staining, PR negative, HER-2 positive ratio 2.62, Ki-67 1%, T1c N0 stage Ia    09/23/2017 Cancer Staging    Staging form: Breast, AJCC 8th Edition - Pathologic: Stage IA (pT1c, pN0, cM0, G2, ER+, PR-, HER2+) - Signed by Gardenia Phlegm, NP on 09/23/2017    10/15/2017 -  Chemotherapy    The patient had trastuzumab (HERCEPTIN) 399 mg in sodium chloride 0.9 % 250 mL chemo infusion, 4 mg/kg = 588 mg, Intravenous,  Once, 1 of 3 cycles Administration: 399 mg (11/02/2017) PACLitaxel (TAXOL) 168 mg in sodium chloride 0.9 % 250 mL chemo infusion (</= 83m/m2), 80 mg/m2 = 168 mg, Intravenous,  Once, 1 of 3 cycles Administration: 168 mg (11/02/2017)  for chemotherapy treatment.      CHIEF COMPLIANT: Cycle 2 Taxol Herceptin  INTERVAL HISTORY:  Sylvia DANIELEis a 72year old with above-mentioned history of left breast cancer treated with lumpectomy and is now on adjuvant chemotherapy and today cycle 2 of Taxol Herceptin.  Patient has a history of irritable bowel syndrome and had an episode of diarrhea on Friday along with severe fatigue. Other than that she is doing quite well.  She had a dental instrumentation done and had to go on amoxicillin antibiotic for that.  REVIEW OF SYSTEMS:   Constitutional: Denies fevers, chills or abnormal weight loss Eyes: Denies blurriness of vision Ears, nose, mouth, throat, and face: Denies mucositis or sore throat Respiratory: Denies cough, dyspnea or wheezes Cardiovascular: Denies palpitation, chest discomfort Gastrointestinal: Diarrhea Skin: Denies abnormal skin rashes Lymphatics: Denies new lymphadenopathy or easy bruising Neurological:Denies numbness, tingling or new weaknesses Behavioral/Psych: Mood is stable, no new changes  Extremities: No lower extremity edema  All other systems were reviewed with the patient and are negative.  I have reviewed the past medical history, past surgical history, social history and family history with the patient and they are unchanged from previous note.  ALLERGIES:  is allergic to fiorinal-codeine #3 [butalbital-asa-caff-codeine]; lansoprazole; latex; butalbital-aspirin-caffeine; sulfa antibiotics; femara [letrozole]; and sulfasalazine.  MEDICATIONS:  Current Outpatient Medications  Medication Sig Dispense Refill  . atorvastatin (LIPITOR) 10 MG tablet Take 10 mg by mouth at bedtime.    . Calcium Carb-Cholecalciferol (CALCIUM 600+D) 600-800 MG-UNIT TABS Take 1 tablet by mouth daily.     . cyclobenzaprine (FLEXERIL) 10 MG tablet Take 10 mg by mouth 3 (three) times  daily as needed for muscle spasms.     Marland Kitchen dicyclomine (BENTYL) 10 MG capsule Take 1 capsule (10 mg total) by mouth 3 (three) times daily as needed for spasms. Patient states that she started  this 3 weeks ago (Patient taking differently: Take 10 mg by mouth daily. ) 270 capsule 1  . escitalopram (LEXAPRO) 5 MG tablet Take 5 mg by mouth daily.    . hydrochlorothiazide (HYDRODIURIL) 25 MG tablet Take 25 mg by mouth daily.    . hydroxypropyl methylcellulose / hypromellose (ISOPTO TEARS / GONIOVISC) 2.5 % ophthalmic solution Place 1 drop into both eyes 3 (three) times daily as needed for dry eyes.    Marland Kitchen lidocaine-prilocaine (EMLA) cream Apply to affected area once 30 g 3  . loperamide (IMODIUM) 2 MG capsule Take 2 mg by mouth daily before breakfast.    . LORazepam (ATIVAN) 0.5 MG tablet Take 1 tablet (0.5 mg total) by mouth at bedtime as needed for sleep. 30 tablet 0  . meloxicam (MOBIC) 7.5 MG tablet Take 7.5 mg by mouth 2 (two) times daily as needed for pain.     . Menthol, Topical Analgesic, (ICY HOT) 5 % PADS Apply 1 patch topically daily as needed (pain).     . ondansetron (ZOFRAN) 8 MG tablet Take 1 tablet (8 mg total) by mouth 2 (two) times daily as needed (Nausea or vomiting). 30 tablet 1  . oxyCODONE (OXY IR/ROXICODONE) 5 MG immediate release tablet Take 1 tablet (5 mg total) by mouth every 6 (six) hours as needed for severe pain. 10 tablet 0  . prochlorperazine (COMPAZINE) 10 MG tablet Take 1 tablet (10 mg total) by mouth every 6 (six) hours as needed (Nausea or vomiting). 30 tablet 1   No current facility-administered medications for this visit.     PHYSICAL EXAMINATION: ECOG PERFORMANCE STATUS: 1 - Symptomatic but completely ambulatory  Vitals:   11/09/17 0949  BP: (!) 161/83  Pulse: 72  Resp: 18  Temp: 98.1 F (36.7 C)  SpO2: 100%   Filed Weights   11/09/17 0949  Weight: 215 lb 9.6 oz (97.8 kg)    GENERAL:alert, no distress and comfortable SKIN: skin color, texture, turgor are normal, no rashes or significant lesions EYES: normal, Conjunctiva are pink and non-injected, sclera clear OROPHARYNX:no exudate, no erythema and lips, buccal mucosa, and tongue normal    NECK: supple, thyroid normal size, non-tender, without nodularity LYMPH:  no palpable lymphadenopathy in the cervical, axillary or inguinal LUNGS: clear to auscultation and percussion with normal breathing effort HEART: regular rate & rhythm and no murmurs and no lower extremity edema ABDOMEN:abdomen soft, non-tender and normal bowel sounds MUSCULOSKELETAL:no cyanosis of digits and no clubbing  NEURO: alert & oriented x 3 with fluent speech, no focal motor/sensory deficits EXTREMITIES: No lower extremity edema   LABORATORY DATA:  I have reviewed the data as listed CMP Latest Ref Rng & Units 11/02/2017 09/03/2017 08/12/2017  Glucose 70 - 99 mg/dL 96 105(H) 115(H)  BUN 8 - 23 mg/dL 19 24(H) 21  Creatinine 0.44 - 1.00 mg/dL 0.79 0.70 0.85  Sodium 135 - 145 mmol/L 144 141 142  Potassium 3.5 - 5.1 mmol/L 3.7 3.5 3.3(L)  Chloride 98 - 111 mmol/L 107 109 108  CO2 22 - 32 mmol/L 27 26 25   Calcium 8.9 - 10.3 mg/dL 9.5 9.2 9.4  Total Protein 6.5 - 8.1 g/dL 6.8 - 6.7  Total Bilirubin 0.3 - 1.2 mg/dL 0.5 - 0.6  Alkaline Phos 38 - 126  U/L 80 - 81  AST 15 - 41 U/L 16 - 14(L)  ALT 0 - 44 U/L 19 - 19    Lab Results  Component Value Date   WBC 3.9 (L) 11/09/2017   HGB 12.6 11/09/2017   HCT 38.5 11/09/2017   MCV 94.1 11/09/2017   PLT 168 11/09/2017   NEUTROABS 2.4 11/09/2017    ASSESSMENT & PLAN:  Malignant neoplasm of upper-outer quadrant of left breast in female, estrogen receptor positive ([facility]) 09/11/2017:Left lumpectomy: Grade 2 invasive lobular cancer, 1.1 cm, LCIS, margins negative, 0/2 lymph nodes negative, ER 90% strong staining, PR negative, HER-2 positive ratio 2.62, Ki-67 1%, T1c N0 stage Ia  Pathology counseling: I discussed the final pathology report of the patient provided a copy of this report. I discussed the margins as well as lymph node surgeries. We also discussed the final staging along with previously performed ER/PR and HER-2/neu testing.  Recommendation: 1.Adjuvant  therapy with Taxol Herceptin weekly x12 followed by Herceptin maintenance for 1 year 2.Adjuvant radiation 3.Followed by adjuvant antiestrogen therapy ------------------------------------------------------------------ Current treatment: Cycle 2 day 1 Taxol Herceptin  Echocardiogram: 08/17/2017 EF 55 to 60%  Chemo toxicities: 1.  Diarrhea: Could be related to her irritable bowel syndrome rather than her treatment. Instructed her on increasing her fluid intake. 2. Fatigue: Patient was previously fatigued even prior to starting chemotherapy.  labs reviewed  Return to clinic weekly for chemo and every 2 weeks for follow-up with me    No orders of the defined types were placed in this encounter.  The patient has a good understanding of the overall plan. she agrees with it. she will call with any problems that may develop before the next visit here.   Harriette Ohara, MD 11/09/17

## 2017-11-09 NOTE — Patient Instructions (Signed)
[facility] Discharge Instructions for Patients Receiving Chemotherapy  Today you received the following chemotherapy agents Taxol and Herceptin  To help prevent nausea and vomiting after your treatment, we encourage you to take your nausea medication as directed   If you develop nausea and vomiting that is not controlled by your nausea medication, call the clinic.   BELOW ARE SYMPTOMS THAT SHOULD BE REPORTED IMMEDIATELY:  *FEVER GREATER THAN 100.5 F  *CHILLS WITH OR WITHOUT FEVER  NAUSEA AND VOMITING THAT IS NOT CONTROLLED WITH YOUR NAUSEA MEDICATION  *UNUSUAL SHORTNESS OF BREATH  *UNUSUAL BRUISING OR BLEEDING  TENDERNESS IN MOUTH AND THROAT WITH OR WITHOUT PRESENCE OF ULCERS  *URINARY PROBLEMS  *BOWEL PROBLEMS  UNUSUAL RASH Items with * indicate a potential emergency and should be followed up as soon as possible.  Feel free to call the clinic should you have any questions or concerns. The clinic phone number is [phone].  Please show the CHEMO ALERT CARD at check-in to the Emergency Department and triage nurse.   

## 2017-11-10 ENCOUNTER — Telehealth: Payer: Self-pay

## 2017-11-10 NOTE — Telephone Encounter (Signed)
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  Nurse educated patient on proper hand hygiene, and s/s to be mindful of.  Also, nurse informed patient we will monitor lab values and if those deviate we will inform of values and might recommend to not be around large crowds during that time.  Patient voiced understanding.  No further needs at this time.

## 2017-11-16 ENCOUNTER — Inpatient Hospital Stay: Payer: Medicare Other

## 2017-11-16 ENCOUNTER — Inpatient Hospital Stay: Payer: Medicare Other | Attending: Hematology and Oncology

## 2017-11-16 ENCOUNTER — Other Ambulatory Visit: Payer: Self-pay

## 2017-11-16 VITALS — BP 146/69 | HR 55 | Temp 98.0°F | Resp 16

## 2017-11-16 DIAGNOSIS — Z5111 Encounter for antineoplastic chemotherapy: Secondary | ICD-10-CM | POA: Diagnosis not present

## 2017-11-16 DIAGNOSIS — C50412 Malignant neoplasm of upper-outer quadrant of left female breast: Secondary | ICD-10-CM

## 2017-11-16 DIAGNOSIS — Z170 Estrogen receptor positive status [ER+]: Secondary | ICD-10-CM

## 2017-11-16 DIAGNOSIS — Z95828 Presence of other vascular implants and grafts: Secondary | ICD-10-CM

## 2017-11-16 DIAGNOSIS — Z5112 Encounter for antineoplastic immunotherapy: Secondary | ICD-10-CM | POA: Insufficient documentation

## 2017-11-16 DIAGNOSIS — Z5181 Encounter for therapeutic drug level monitoring: Secondary | ICD-10-CM

## 2017-11-16 DIAGNOSIS — Z79899 Other long term (current) drug therapy: Principal | ICD-10-CM

## 2017-11-16 LAB — CBC WITH DIFFERENTIAL (CANCER CENTER ONLY)
Abs Immature Granulocytes: 0.03 10*3/uL (ref 0.00–0.07)
Basophils Absolute: 0 10*3/uL (ref 0.0–0.1)
Basophils Relative: 0 %
EOS PCT: 2 %
Eosinophils Absolute: 0.1 10*3/uL (ref 0.0–0.5)
HCT: 37 % (ref 36.0–46.0)
HEMOGLOBIN: 12.2 g/dL (ref 12.0–15.0)
IMMATURE GRANULOCYTES: 1 %
Lymphocytes Relative: 22 %
Lymphs Abs: 1.1 10*3/uL (ref 0.7–4.0)
MCH: 31 pg (ref 26.0–34.0)
MCHC: 33 g/dL (ref 30.0–36.0)
MCV: 94.1 fL (ref 80.0–100.0)
MONO ABS: 0.4 10*3/uL (ref 0.1–1.0)
MONOS PCT: 8 %
NEUTROS PCT: 67 %
Neutro Abs: 3.3 10*3/uL (ref 1.7–7.7)
PLATELETS: 177 10*3/uL (ref 150–400)
RBC: 3.93 MIL/uL (ref 3.87–5.11)
RDW: 12.9 % (ref 11.5–15.5)
WBC Count: 4.9 10*3/uL (ref 4.0–10.5)
nRBC: 0 % (ref 0.0–0.2)

## 2017-11-16 LAB — CMP (CANCER CENTER ONLY)
ALK PHOS: 71 U/L (ref 38–126)
ALT: 26 U/L (ref 0–44)
AST: 15 U/L (ref 15–41)
Albumin: 3.4 g/dL — ABNORMAL LOW (ref 3.5–5.0)
Anion gap: 8 (ref 5–15)
BUN: 20 mg/dL (ref 8–23)
CALCIUM: 9.2 mg/dL (ref 8.9–10.3)
CHLORIDE: 107 mmol/L (ref 98–111)
CO2: 29 mmol/L (ref 22–32)
Creatinine: 0.76 mg/dL (ref 0.44–1.00)
Glucose, Bld: 94 mg/dL (ref 70–99)
Potassium: 3.9 mmol/L (ref 3.5–5.1)
SODIUM: 144 mmol/L (ref 135–145)
Total Bilirubin: 0.3 mg/dL (ref 0.3–1.2)
Total Protein: 6.2 g/dL — ABNORMAL LOW (ref 6.5–8.1)

## 2017-11-16 MED ORDER — SODIUM CHLORIDE 0.9% FLUSH
10.0000 mL | Freq: Once | INTRAVENOUS | Status: AC
  Administered 2017-11-16: 10 mL
  Filled 2017-11-16: qty 10

## 2017-11-16 MED ORDER — DEXAMETHASONE SODIUM PHOSPHATE 10 MG/ML IJ SOLN
INTRAMUSCULAR | Status: AC
  Filled 2017-11-16: qty 1

## 2017-11-16 MED ORDER — HEPARIN SOD (PORK) LOCK FLUSH 100 UNIT/ML IV SOLN
500.0000 [IU] | Freq: Once | INTRAVENOUS | Status: AC | PRN
  Administered 2017-11-16: 500 [IU]
  Filled 2017-11-16: qty 5

## 2017-11-16 MED ORDER — TRASTUZUMAB CHEMO 150 MG IV SOLR
2.0000 mg/kg | Freq: Once | INTRAVENOUS | Status: AC
  Administered 2017-11-16: 189 mg via INTRAVENOUS
  Filled 2017-11-16: qty 9

## 2017-11-16 MED ORDER — SODIUM CHLORIDE 0.9 % IV SOLN
80.0000 mg/m2 | Freq: Once | INTRAVENOUS | Status: AC
  Administered 2017-11-16: 168 mg via INTRAVENOUS
  Filled 2017-11-16: qty 28

## 2017-11-16 MED ORDER — SODIUM CHLORIDE 0.9 % IV SOLN
20.0000 mg | Freq: Once | INTRAVENOUS | Status: AC
  Administered 2017-11-16: 20 mg via INTRAVENOUS
  Filled 2017-11-16: qty 2

## 2017-11-16 MED ORDER — DIPHENHYDRAMINE HCL 25 MG PO CAPS
50.0000 mg | ORAL_CAPSULE | Freq: Once | ORAL | Status: AC
  Administered 2017-11-16: 50 mg via ORAL

## 2017-11-16 MED ORDER — DIPHENHYDRAMINE HCL 25 MG PO CAPS
ORAL_CAPSULE | ORAL | Status: AC
  Filled 2017-11-16: qty 2

## 2017-11-16 MED ORDER — DIPHENHYDRAMINE HCL 50 MG/ML IJ SOLN: 25.0000 mg | Freq: Once | INTRAMUSCULAR | Status: DC

## 2017-11-16 MED ORDER — SODIUM CHLORIDE 0.9% FLUSH
10.0000 mL | INTRAVENOUS | Status: DC | PRN
  Administered 2017-11-16: 10 mL
  Filled 2017-11-16: qty 10

## 2017-11-16 MED ORDER — FAMOTIDINE IN NACL 20-0.9 MG/50ML-% IV SOLN: 20.0000 mg | Freq: Once | INTRAVENOUS | Status: DC

## 2017-11-16 MED ORDER — ACETAMINOPHEN 325 MG PO TABS
650.0000 mg | ORAL_TABLET | Freq: Once | ORAL | Status: AC
  Administered 2017-11-16: 650 mg via ORAL

## 2017-11-16 MED ORDER — SODIUM CHLORIDE 0.9 % IV SOLN
Freq: Once | INTRAVENOUS | Status: AC
  Administered 2017-11-16: 13:00:00 via INTRAVENOUS
  Filled 2017-11-16: qty 250

## 2017-11-16 MED ORDER — DEXAMETHASONE SODIUM PHOSPHATE 10 MG/ML IJ SOLN
10.0000 mg | Freq: Once | INTRAMUSCULAR | Status: AC
  Administered 2017-11-16: 10 mg via INTRAVENOUS

## 2017-11-16 MED ORDER — ACETAMINOPHEN 325 MG PO TABS
ORAL_TABLET | ORAL | Status: AC
  Filled 2017-11-16: qty 2

## 2017-11-16 NOTE — Progress Notes (Signed)
Called pt to notify her of her Echocardiogram appt on nov 6th.Pt confirmed time/date.

## 2017-11-16 NOTE — Patient Instructions (Signed)
[facility] Discharge Instructions for Patients Receiving Chemotherapy  Today you received the following chemotherapy agents :  Herceptin,  Taxol.  To help prevent nausea and vomiting after your treatment, we encourage you to take your nausea medication as prescribed.   If you develop nausea and vomiting that is not controlled by your nausea medication, call the clinic.   BELOW ARE SYMPTOMS THAT SHOULD BE REPORTED IMMEDIATELY:  *FEVER GREATER THAN 100.5 F  *CHILLS WITH OR WITHOUT FEVER  NAUSEA AND VOMITING THAT IS NOT CONTROLLED WITH YOUR NAUSEA MEDICATION  *UNUSUAL SHORTNESS OF BREATH  *UNUSUAL BRUISING OR BLEEDING  TENDERNESS IN MOUTH AND THROAT WITH OR WITHOUT PRESENCE OF ULCERS  *URINARY PROBLEMS  *BOWEL PROBLEMS  UNUSUAL RASH Items with * indicate a potential emergency and should be followed up as soon as possible.  Feel free to call the clinic should you have any questions or concerns. The clinic phone number is [phone].  Please show the CHEMO ALERT CARD at check-in to the Emergency Department and triage nurse.   

## 2017-11-17 ENCOUNTER — Other Ambulatory Visit (HOSPITAL_COMMUNITY): Payer: Medicare Other

## 2017-11-18 ENCOUNTER — Ambulatory Visit (HOSPITAL_COMMUNITY)
Admission: RE | Admit: 2017-11-18 | Discharge: 2017-11-18 | Disposition: A | Discharge status: Home / Self Care | Payer: Medicare Other | Source: Ambulatory Visit | Attending: Hematology and Oncology | Admitting: Hematology and Oncology

## 2017-11-18 DIAGNOSIS — E785 Hyperlipidemia, unspecified: Secondary | ICD-10-CM | POA: Insufficient documentation

## 2017-11-18 DIAGNOSIS — Z79899 Other long term (current) drug therapy: Secondary | ICD-10-CM | POA: Diagnosis not present

## 2017-11-18 DIAGNOSIS — Z5181 Encounter for therapeutic drug level monitoring: Secondary | ICD-10-CM

## 2017-11-18 DIAGNOSIS — Z853 Personal history of malignant neoplasm of breast: Secondary | ICD-10-CM | POA: Diagnosis not present

## 2017-11-18 DIAGNOSIS — I10 Essential (primary) hypertension: Secondary | ICD-10-CM | POA: Diagnosis present

## 2017-11-18 NOTE — Progress Notes (Signed)
  Echocardiogram 2D Echocardiogram has been performed.  Sylvia Barnett 11/18/2017, 11:47 AM

## 2017-11-23 ENCOUNTER — Inpatient Hospital Stay: Payer: Medicare Other

## 2017-11-23 ENCOUNTER — Other Ambulatory Visit: Payer: Self-pay | Admitting: Oncology

## 2017-11-23 ENCOUNTER — Inpatient Hospital Stay (HOSPITAL_BASED_OUTPATIENT_CLINIC_OR_DEPARTMENT_OTHER): Payer: Medicare Other | Admitting: Medical

## 2017-11-23 VITALS — BP 164/81 | HR 67 | Temp 97.7°F | Resp 18 | Ht 65.0 in | Wt 219.2 lb

## 2017-11-23 DIAGNOSIS — C50412 Malignant neoplasm of upper-outer quadrant of left female breast: Secondary | ICD-10-CM

## 2017-11-23 DIAGNOSIS — Z5111 Encounter for antineoplastic chemotherapy: Secondary | ICD-10-CM | POA: Diagnosis not present

## 2017-11-23 DIAGNOSIS — M545 Low back pain, unspecified: Secondary | ICD-10-CM

## 2017-11-23 DIAGNOSIS — Z170 Estrogen receptor positive status [ER+]: Secondary | ICD-10-CM

## 2017-11-23 DIAGNOSIS — Z5112 Encounter for antineoplastic immunotherapy: Secondary | ICD-10-CM | POA: Diagnosis not present

## 2017-11-23 DIAGNOSIS — Z95828 Presence of other vascular implants and grafts: Secondary | ICD-10-CM

## 2017-11-23 LAB — CBC WITH DIFFERENTIAL (CANCER CENTER ONLY)
ABS IMMATURE GRANULOCYTES: 0.02 10*3/uL (ref 0.00–0.07)
Basophils Absolute: 0 10*3/uL (ref 0.0–0.1)
Basophils Relative: 1 %
Eosinophils Absolute: 0.1 10*3/uL (ref 0.0–0.5)
Eosinophils Relative: 2 %
HEMATOCRIT: 34.8 % — AB (ref 36.0–46.0)
HEMOGLOBIN: 11.4 g/dL — AB (ref 12.0–15.0)
IMMATURE GRANULOCYTES: 1 %
LYMPHS ABS: 1 10*3/uL (ref 0.7–4.0)
Lymphocytes Relative: 22 %
MCH: 31.1 pg (ref 26.0–34.0)
MCHC: 32.8 g/dL (ref 30.0–36.0)
MCV: 95.1 fL (ref 80.0–100.0)
MONO ABS: 0.3 10*3/uL (ref 0.1–1.0)
MONOS PCT: 8 %
NEUTROS ABS: 2.8 10*3/uL (ref 1.7–7.7)
NEUTROS PCT: 66 %
PLATELETS: 177 10*3/uL (ref 150–400)
RBC: 3.66 MIL/uL — ABNORMAL LOW (ref 3.87–5.11)
RDW: 13.3 % (ref 11.5–15.5)
WBC Count: 4.2 10*3/uL (ref 4.0–10.5)
nRBC: 0 % (ref 0.0–0.2)

## 2017-11-23 LAB — CMP (CANCER CENTER ONLY)
ALK PHOS: 68 U/L (ref 38–126)
ALT: 23 U/L (ref 0–44)
AST: 16 U/L (ref 15–41)
Albumin: 3.2 g/dL — ABNORMAL LOW (ref 3.5–5.0)
Anion gap: 7 (ref 5–15)
BILIRUBIN TOTAL: 0.5 mg/dL (ref 0.3–1.2)
BUN: 17 mg/dL (ref 8–23)
CALCIUM: 9.2 mg/dL (ref 8.9–10.3)
CHLORIDE: 108 mmol/L (ref 98–111)
CO2: 30 mmol/L (ref 22–32)
Creatinine: 0.72 mg/dL (ref 0.44–1.00)
GFR, Est AFR Am: >60 mL/min (ref >60)
Glucose, Bld: 114 mg/dL — ABNORMAL HIGH (ref 70–99)
Potassium: 3.4 mmol/L — ABNORMAL LOW (ref 3.5–5.1)
Sodium: 145 mmol/L (ref 135–145)
Total Protein: 5.9 g/dL — ABNORMAL LOW (ref 6.5–8.1)

## 2017-11-23 MED ORDER — SODIUM CHLORIDE 0.9 % IV SOLN
80.0000 mg/m2 | Freq: Once | INTRAVENOUS | Status: AC
  Administered 2017-11-23: 168 mg via INTRAVENOUS
  Filled 2017-11-23: qty 28

## 2017-11-23 MED ORDER — SODIUM CHLORIDE 0.9 % IV SOLN
Freq: Once | INTRAVENOUS | Status: AC
  Administered 2017-11-23: 13:00:00 via INTRAVENOUS
  Filled 2017-11-23: qty 250

## 2017-11-23 MED ORDER — FAMOTIDINE IN NACL 20-0.9 MG/50ML-% IV SOLN: 20.0000 mg | Freq: Once | INTRAVENOUS | Status: DC

## 2017-11-23 MED ORDER — DIPHENHYDRAMINE HCL 25 MG PO CAPS
50.0000 mg | ORAL_CAPSULE | Freq: Once | ORAL | Status: AC
  Administered 2017-11-23: 50 mg via ORAL

## 2017-11-23 MED ORDER — DEXAMETHASONE SODIUM PHOSPHATE 10 MG/ML IJ SOLN
10.0000 mg | Freq: Once | INTRAMUSCULAR | Status: AC
  Administered 2017-11-23: 10 mg via INTRAVENOUS

## 2017-11-23 MED ORDER — DIPHENHYDRAMINE HCL 25 MG PO CAPS
ORAL_CAPSULE | ORAL | Status: AC
  Filled 2017-11-23: qty 2

## 2017-11-23 MED ORDER — HEPARIN SOD (PORK) LOCK FLUSH 100 UNIT/ML IV SOLN
500.0000 [IU] | Freq: Once | INTRAVENOUS | Status: AC | PRN
  Administered 2017-11-23: 500 [IU]
  Filled 2017-11-23: qty 5

## 2017-11-23 MED ORDER — ACETAMINOPHEN 325 MG PO TABS
ORAL_TABLET | ORAL | Status: AC
  Filled 2017-11-23: qty 2

## 2017-11-23 MED ORDER — TRASTUZUMAB CHEMO 150 MG IV SOLR
2.0000 mg/kg | Freq: Once | INTRAVENOUS | Status: AC
  Administered 2017-11-23: 189 mg via INTRAVENOUS
  Filled 2017-11-23: qty 9

## 2017-11-23 MED ORDER — DEXAMETHASONE SODIUM PHOSPHATE 10 MG/ML IJ SOLN
INTRAMUSCULAR | Status: AC
  Filled 2017-11-23: qty 1

## 2017-11-23 MED ORDER — SODIUM CHLORIDE 0.9 % IV SOLN
20.0000 mg | Freq: Once | INTRAVENOUS | Status: AC
  Administered 2017-11-23: 20 mg via INTRAVENOUS
  Filled 2017-11-23: qty 2

## 2017-11-23 MED ORDER — ACETAMINOPHEN 325 MG PO TABS
650.0000 mg | ORAL_TABLET | Freq: Once | ORAL | Status: AC
  Administered 2017-11-23: 650 mg via ORAL

## 2017-11-23 MED ORDER — SODIUM CHLORIDE 0.9% FLUSH
10.0000 mL | Freq: Once | INTRAVENOUS | Status: AC
  Administered 2017-11-23: 10 mL
  Filled 2017-11-23: qty 10

## 2017-11-23 MED ORDER — SODIUM CHLORIDE 0.9% FLUSH
10.0000 mL | INTRAVENOUS | Status: DC | PRN
  Administered 2017-11-23: 10 mL
  Filled 2017-11-23: qty 10

## 2017-11-23 NOTE — Progress Notes (Signed)
Spoke w/ Dr. Jana Hakim since Dr. Lindi Adie is on PAL - Herceptin dose today is to be 2 mg/kg. Patient received last week and is coming back next week for infusion.   Demetrius Charity, PharmD, [facility] Pharmac[facility] Pharmacy Phone: [phone] 11/23/2017

## 2017-11-23 NOTE — Patient Instructions (Signed)
[facility] Discharge Instructions for Patients Receiving Chemotherapy  Today you received the following chemotherapy agents Taxol and Herceptin  To help prevent nausea and vomiting after your treatment, we encourage you to take your nausea medication as directed   If you develop nausea and vomiting that is not controlled by your nausea medication, call the clinic.   BELOW ARE SYMPTOMS THAT SHOULD BE REPORTED IMMEDIATELY:  *FEVER GREATER THAN 100.5 F  *CHILLS WITH OR WITHOUT FEVER  NAUSEA AND VOMITING THAT IS NOT CONTROLLED WITH YOUR NAUSEA MEDICATION  *UNUSUAL SHORTNESS OF BREATH  *UNUSUAL BRUISING OR BLEEDING  TENDERNESS IN MOUTH AND THROAT WITH OR WITHOUT PRESENCE OF ULCERS  *URINARY PROBLEMS  *BOWEL PROBLEMS  UNUSUAL RASH Items with * indicate a potential emergency and should be followed up as soon as possible.  Feel free to call the clinic should you have any questions or concerns. The clinic phone number is [phone].  Please show the CHEMO ALERT CARD at check-in to the Emergency Department and triage nurse.   

## 2017-11-23 NOTE — Progress Notes (Signed)
Patient was scheduled for 4 mg/kg of trastuzumab today but she is also receiving it next week and she did receive it last week so she does not need a reload.  We are changing it to 2 mg/kg today and I am requesting they asked Dr. Payton Mccallum whether for some reason he wanted to increase the dose.  I did review his most recent note and it does not suggest that.

## 2017-11-23 NOTE — Patient Instructions (Signed)
Implanted Port Home Guide An implanted port is a type of central line that is placed under the skin. Central lines are used to provide IV access when treatment or nutrition needs to be given through a person's veins. Implanted ports are used for long-term IV access. An implanted port may be placed because:  You need IV medicine that would be irritating to the small veins in your hands or arms.  You need long-term IV medicines, such as antibiotics.  You need IV nutrition for a long period.  You need frequent blood draws for lab tests.  You need dialysis.  Implanted ports are usually placed in the chest area, but they can also be placed in the upper arm, the abdomen, or the leg. An implanted port has two main parts:  Reservoir. The reservoir is round and will appear as a small, raised area under your skin. The reservoir is the part where a needle is inserted to give medicines or draw blood.  Catheter. The catheter is a thin, flexible tube that extends from the reservoir. The catheter is placed into a large vein. Medicine that is inserted into the reservoir goes into the catheter and then into the vein.  How will I care for my incision site? Do not get the incision site wet. Bathe or shower as directed by your health care provider. How is my port accessed? Special steps must be taken to access the port:  Before the port is accessed, a numbing cream can be placed on the skin. This helps numb the skin over the port site.  Your health care provider uses a sterile technique to access the port. ? Your health care provider must put on a mask and sterile gloves. ? The skin over your port is cleaned carefully with an antiseptic and allowed to dry. ? The port is gently pinched between sterile gloves, and a needle is inserted into the port.  Only "non-coring" port needles should be used to access the port. Once the port is accessed, a blood return should be checked. This helps ensure that the port  is in the vein and is not clogged.  If your port needs to remain accessed for a constant infusion, a clear (transparent) bandage will be placed over the needle site. The bandage and needle will need to be changed every week, or as directed by your health care provider.  Keep the bandage covering the needle clean and dry. Do not get it wet. Follow your health care provider's instructions on how to take a shower or bath while the port is accessed.  If your port does not need to stay accessed, no bandage is needed over the port.  What is flushing? Flushing helps keep the port from getting clogged. Follow your health care provider's instructions on how and when to flush the port. Ports are usually flushed with saline solution or a medicine called heparin. The need for flushing will depend on how the port is used.  If the port is used for intermittent medicines or blood draws, the port will need to be flushed: ? After medicines have been given. ? After blood has been drawn. ? As part of routine maintenance.  If a constant infusion is running, the port may not need to be flushed.  How long will my port stay implanted? The port can stay in for as long as your health care provider thinks it is needed. When it is time for the port to come out, surgery will be   done to remove it. The procedure is similar to the one performed when the port was put in. When should I seek immediate medical care? When you have an implanted port, you should seek immediate medical care if:  You notice a bad smell coming from the incision site.  You have swelling, redness, or drainage at the incision site.  You have more swelling or pain at the port site or the surrounding area.  You have a fever that is not controlled with medicine.  This information is not intended to replace advice given to you by your health care provider. Make sure you discuss any questions you have with your health care provider. Document  Released: 12/30/2004 Document Revised: 06/07/2015 Document Reviewed: 09/06/2012 Elsevier Interactive Patient Education  2017 Elsevier Inc.  

## 2017-11-23 NOTE — Progress Notes (Signed)
Pt seen by PA Van only, no RN assessment at this time.  PA aware. 

## 2017-11-24 ENCOUNTER — Telehealth: Payer: Self-pay

## 2017-11-24 NOTE — Telephone Encounter (Signed)
-----   Message from Gardenia Phlegm, NP sent at 11/23/2017  6:36 PM EST ----- Potassium slightly low, recommend increased potassium in diet ----- Message ----- From: Interface, Lab In [place] Sent: 11/23/2017  11:37 AM EST To: Nicholas Lose, MD

## 2017-11-24 NOTE — Telephone Encounter (Signed)
Spoke with patient regarding low potassium, recommendation to increase potassium in diet.Patient voiced understanding and thanks for call.

## 2017-11-25 NOTE — Progress Notes (Signed)
[facility] Progress Note   Sylvia Barnett [id number] 1945-03-29 72 y.o.  Sylvia Barnett is managed by Dr. Nicholas Lose  Actively treated with chemotherapy/immunotherapy: yes  Current Therapy: Taxol and Herceptin  Last Treated: 11/23/2017 (cycle 1, day 22)  Assessment: Plan:    Acute left-sided low back pain without sciatica  Malignant neoplasm of upper-outer quadrant of left breast in female, estrogen receptor positive ([place])   Acute left-sided lower back pain: Reviewed the side effects of Taxol and Herceptin with the patient.  We discussed that her back pain certainly could be associated with either of these medications.  No intervention was indicated today.  She will return as needed.  ER positive malignant neoplasm of the left breast: The patient will receive cycle 1, day 22 of Taxol and Herceptin today.  She will follow-up as scheduled on 11/30/2017.  Please see After Visit Summary for patient specific instructions.  Future Appointments  Date Time Provider [place]  11/30/2017 10:30 AM [facility] LAB 2 [facility] None  11/30/2017 10:45 AM [facility] [place] FLUSH [facility] None  11/30/2017 11:15 AM Nicholas Lose, MD [facility] None  11/30/2017 12:00 PM [facility] INFUSION [facility] None  12/07/2017 10:45 AM [facility] LAB 2 [facility] None  12/07/2017 11:00 AM [facility] [place] FLUSH [facility] None  12/07/2017 12:00 PM [facility] INFUSION [facility] None  12/14/2017 10:00 AM [facility] LAB 3 [facility] None  12/14/2017 10:15 AM [facility] [place] FLUSH [facility] None  12/14/2017 10:45 AM Nicholas Lose, MD [facility] None  12/14/2017 12:00 PM [facility] INFUSION [facility] None  12/21/2017 10:45 AM [facility] LAB 2 [facility] None  12/21/2017 11:00 AM [facility] [place] FLUSH [facility] None  12/21/2017 12:00 PM [facility] INFUSION [facility] None  12/28/2017 10:15 AM [facility] LAB 2 [facility] None  12/28/2017 10:30 AM [facility] [place] FLUSH  [facility] None  12/28/2017 11:00 AM Nicholas Lose, MD [facility] None  12/28/2017 12:00 PM [facility] INFUSION [facility] None  01/04/2018 10:45 AM [facility] LAB 6 [facility] None  01/04/2018 11:00 AM [facility] [place] FLUSH [facility] None  01/04/2018 12:00 PM [facility] INFUSION [facility] None  01/11/2018 10:15 AM [facility] LAB 2 [facility] None  01/11/2018 10:30 AM [facility] [place] FLUSH [facility] None  01/11/2018 11:00 AM Nicholas Lose, MD [facility] None  01/11/2018 12:00 PM [facility] INFUSION [facility] None  01/18/2018  9:45 AM [facility] LAB 3 [facility] None  01/18/2018 10:00 AM [facility] [place] FLUSH [facility] None  01/18/2018 11:00 AM [facility] INFUSION [facility] None  08/10/2018  9:00 AM Rehman, Mechele Dawley, MD [facility] None    No orders of the defined types were placed in this encounter.      Subjective:   Patient ID:  Sylvia Barnett is a 72 y.o. (DOB 1945-07-21) female.  Chief Complaint: No chief complaint on file.   HPI Sylvia Barnett is a 72 year old female with a diagnosis of an ER positive malignant neoplasm of the left breast who is managed by Dr. Nicholas Lose.  She is scheduled to receive cycle 1 day 22 of Taxol and Herceptin today.  She reports having a intermittent left-sided lower back pain which is not associated with any activity.  She has not had any changes in activity and has had no trauma.  She denies fevers, chills, sweats, nausea, vomiting, or dysuria.  Medications: I have reviewed the patient's current medications.  Allergies:  Allergies  Allergen Reactions  . Fiorinal-Codeine #3 [Butalbital-Asa-Caff-Codeine] Rash  . Lansoprazole Swelling  . Latex Swelling, Other (See Comments), Itching and Rash    blisters  . Butalbital-Aspirin-Caffeine Rash  .  Sulfa Antibiotics Rash  . Femara [Letrozole] Rash    Rash when tried in 1984  . Sulfasalazine Rash    Past Medical History:  Diagnosis Date  . Anxiety    Since in her 20's  .  Arthritis 2013   Right hip  . Cancer [facility]) 2004    Breast Lumpectomy  . Complication of anesthesia    Has a small throat  . Depression    Started when she was in her 9's  . Hypercholesteremia 2012  . Hypertension 2013  . IBS (irritable bowel syndrome) 2018   per GI note  . Incontinence-urinary 2012  . Lobular carcinoma in situ (LCIS) of left breast 09/08/2008  . Melanoma of back ([place]) 1994   Excision with clear margins  . Microscopic colitis 2015   Resolved 2016    Past Surgical History:  Procedure Laterality Date  . ABDOMINAL HYSTERECTOMY  2002  . APPENDECTOMY  1949  . BIOPSY BREAST  2010   Left  . Bladder Tack  2002  . BREAST LUMPECTOMY  2004   Left  . BREAST LUMPECTOMY  2004   Left  . BREAST LUMPECTOMY  2006   Left  . BREAST LUMPECTOMY  2000   Left  . BREAST LUMPECTOMY WITH RADIOACTIVE SEED AND SENTINEL LYMPH NODE BIOPSY Left 09/11/2017   Procedure: LEFT BREAST LUMPECTOMY X2 WITH RADIOACTIVE SEED AND LEFT AXILLARY SENTINEL LYMPH NODE BIOPSY;  Surgeon: Excell Seltzer, MD;  Location: [place];  Service: General;  Laterality: Left;  . BUNIONECTOMY  1994   Right  . CATARACT EXTRACTION W/PHACO Left 08/18/2013   Procedure: CATARACT EXTRACTION PHACO AND INTRAOCULAR LENS PLACEMENT (IOC);  Surgeon: Tonny Branch, MD;  Location: [facility];  Service: Ophthalmology;  Laterality: Left;  CDE: 8.62  . COLONOSCOPY N/A 11/10/2013   Procedure: COLONOSCOPY;  Surgeon: Rogene Houston, MD;  [facility];  Service: Endoscopy;  Laterality: N/A;  240  . EYE SURGERY  2011   Catarct Right  . EYE SURGERY  08-2011   Right Yag procedure  . JOINT REPLACEMENT  08-2010   Left total knee  . KNEE ARTHROSCOPY  2010   Right  . KNEE ARTHROSCOPY  2003   Left  . PORTACATH PLACEMENT N/A 09/11/2017   Procedure: INSERTION PORT-A-CATH;  Surgeon: Excell Seltzer, MD;  Location: [place];  Service: General;  Laterality: N/A;  . TOTAL KNEE ARTHROPLASTY  11/03/2011   Procedure: TOTAL KNEE ARTHROPLASTY;   Surgeon: Gearlean Alf, MD;  Location: [facility];  Service: Orthopedics;  Laterality: Right;  . TUBAL LIGATION    . WRIST SURGERY Bilateral 03-2010  . WRIST SURGERY  2008   Right    Family History  Problem Relation Age of Onset  . Breast cancer Sister 61  . Lymphoma Maternal Aunt     Social History   Socioeconomic History  . Marital status: Married    Spouse name: Not on file  . Number of children: Not on file  . Years of education: Not on file  . Highest education level: Not on file  Occupational History  . Not on file  Social Needs  . Financial resource strain: Not on file  . Food insecurity:    Worry: Not on file    Inability: Not on file  . Transportation needs:    Medical: Not on file    Non-medical: Not on file  Tobacco Use  . Smoking status: Never Smoker  . Smokeless tobacco: Never Used  Substance and Sexual  Activity  . Alcohol use: No    Alcohol/week: 0.0 standard drinks  . Drug use: No  . Sexual activity: Yes    Birth control/protection: Surgical  Lifestyle  . Physical activity:    Days per week: Not on file    Minutes per session: Not on file  . Stress: Not on file  Relationships  . Social connections:    Talks on phone: Not on file    Gets together: Not on file    Attends religious service: Not on file    Active member of club or organization: Not on file    Attends meetings of clubs or organizations: Not on file    Relationship status: Not on file  . Intimate partner violence:    Fear of current or ex partner: Not on file    Emotionally abused: Not on file    Physically abused: Not on file    Forced sexual activity: Not on file  Other Topics Concern  . Not on file  Social History Narrative  . Not on file    Past Medical History, Surgical history, Social history, and Family history were reviewed and updated as appropriate.   Please see review of systems for further details on the patient's review from today.   Review of Systems:  Review  of Systems  Constitutional: Negative for chills, diaphoresis and fever.  HENT: Negative for trouble swallowing and voice change.   Respiratory: Negative for cough, chest tightness, shortness of breath and wheezing.   Cardiovascular: Negative for chest pain and palpitations.  Gastrointestinal: Negative for abdominal pain, constipation, diarrhea, nausea and vomiting.  Musculoskeletal: Positive for back pain. Negative for myalgias.  Neurological: Negative for dizziness, light-headedness and headaches.    Objective:   Physical Exam:  BP (!) 164/81 (BP Location: Left Arm, Patient Position: Sitting)   Pulse 67   Temp 97.7 F (36.5 C) (Oral)   Resp 18   Ht 5\' 5"  (1.651 m)   Wt 219 lb 3.2 oz (99.4 kg)   SpO2 100%   BMI 36.48 kg/m  ECOG: 0  Physical Exam  Constitutional: No distress.  HENT:  Head: Normocephalic and atraumatic.  Cardiovascular: Normal rate, regular rhythm and normal heart sounds. Exam reveals no gallop and no friction rub.  No murmur heard. Pulmonary/Chest: Effort normal and breath sounds normal. No respiratory distress. She has no wheezes. She has no rales.  Musculoskeletal: She exhibits tenderness (There is an area of tenderness in the left lumbar paraspinous muscles consistent with a muscle spasm.).  Neurological: She is alert.  Skin: Skin is warm and dry. No rash noted. She is not diaphoretic. No erythema.    Lab Review:     Component Value Date/Time   NA 145 11/23/2017 1027   K 3.4 (L) 11/23/2017 1027   CL 108 11/23/2017 1027   CO2 30 11/23/2017 1027   GLUCOSE 114 (H) 11/23/2017 1027   BUN 17 11/23/2017 1027   CREATININE 0.72 11/23/2017 1027   CALCIUM 9.2 11/23/2017 1027   PROT 5.9 (L) 11/23/2017 1027   ALBUMIN 3.2 (L) 11/23/2017 1027   AST 16 11/23/2017 1027   ALT 23 11/23/2017 1027   ALKPHOS 68 11/23/2017 1027   BILITOT 0.5 11/23/2017 1027   GFRNONAA >60 11/23/2017 1027   GFRAA >60 11/23/2017 1027       Component Value Date/Time   WBC 4.2  11/23/2017 1027   WBC 5.6 09/03/2017 1000   RBC 3.66 (L) 11/23/2017 1027   HGB 11.4 (  L) 11/23/2017 1027   HCT 34.8 (L) 11/23/2017 1027   PLT 177 11/23/2017 1027   MCV 95.1 11/23/2017 1027   MCH 31.1 11/23/2017 1027   MCHC 32.8 11/23/2017 1027   RDW 13.3 11/23/2017 1027   LYMPHSABS 1.0 11/23/2017 1027   MONOABS 0.3 11/23/2017 1027   EOSABS 0.1 11/23/2017 1027   BASOSABS 0.0 11/23/2017 1027   -------------------------------  Imaging from last 24 hours (if applicable):  Radiology interpretation: No results found.

## 2017-11-26 ENCOUNTER — Other Ambulatory Visit: Payer: Self-pay | Admitting: Hematology and Oncology

## 2017-11-27 ENCOUNTER — Telehealth: Payer: Self-pay | Admitting: *Deleted

## 2017-11-27 NOTE — Telephone Encounter (Signed)
[id number] Month 1  11/27/17 2:34 PM  Left patient voicemail reminding her of the research lab draw on Monday.  Left contact information in case patient has any questions or concerns. Doreatha Martin, RN, BSN, [facility] 11/27/2017 2:34 PM

## 2017-11-30 ENCOUNTER — Inpatient Hospital Stay: Payer: Medicare Other

## 2017-11-30 ENCOUNTER — Inpatient Hospital Stay (HOSPITAL_BASED_OUTPATIENT_CLINIC_OR_DEPARTMENT_OTHER): Payer: Medicare Other | Admitting: Hematology and Oncology

## 2017-11-30 ENCOUNTER — Telehealth: Payer: Self-pay | Admitting: Hematology and Oncology

## 2017-11-30 ENCOUNTER — Encounter: Payer: Self-pay | Admitting: *Deleted

## 2017-11-30 DIAGNOSIS — C50412 Malignant neoplasm of upper-outer quadrant of left female breast: Secondary | ICD-10-CM

## 2017-11-30 DIAGNOSIS — K580 Irritable bowel syndrome with diarrhea: Secondary | ICD-10-CM | POA: Diagnosis not present

## 2017-11-30 DIAGNOSIS — Z170 Estrogen receptor positive status [ER+]: Principal | ICD-10-CM

## 2017-11-30 DIAGNOSIS — Z5111 Encounter for antineoplastic chemotherapy: Secondary | ICD-10-CM | POA: Diagnosis not present

## 2017-11-30 DIAGNOSIS — R530 Neoplastic (malignant) related fatigue: Secondary | ICD-10-CM

## 2017-11-30 DIAGNOSIS — Z5112 Encounter for antineoplastic immunotherapy: Secondary | ICD-10-CM | POA: Diagnosis not present

## 2017-11-30 DIAGNOSIS — E876 Hypokalemia: Secondary | ICD-10-CM

## 2017-11-30 DIAGNOSIS — Z95828 Presence of other vascular implants and grafts: Secondary | ICD-10-CM

## 2017-11-30 LAB — CBC WITH DIFFERENTIAL (CANCER CENTER ONLY)
Abs Immature Granulocytes: 0.04 10*3/uL (ref 0.00–0.07)
Basophils Absolute: 0 10*3/uL (ref 0.0–0.1)
Basophils Relative: 0 %
EOS PCT: 2 %
Eosinophils Absolute: 0.1 10*3/uL (ref 0.0–0.5)
HEMATOCRIT: 35 % — AB (ref 36.0–46.0)
HEMOGLOBIN: 11.4 g/dL — AB (ref 12.0–15.0)
Immature Granulocytes: 1 %
LYMPHS ABS: 0.8 10*3/uL (ref 0.7–4.0)
LYMPHS PCT: 18 %
MCH: 31.2 pg (ref 26.0–34.0)
MCHC: 32.6 g/dL (ref 30.0–36.0)
MCV: 95.9 fL (ref 80.0–100.0)
MONO ABS: 0.4 10*3/uL (ref 0.1–1.0)
Monocytes Relative: 8 %
Neutro Abs: 3.3 10*3/uL (ref 1.7–7.7)
Neutrophils Relative %: 71 %
Platelet Count: 187 10*3/uL (ref 150–400)
RBC: 3.65 MIL/uL — ABNORMAL LOW (ref 3.87–5.11)
RDW: 13.6 % (ref 11.5–15.5)
WBC Count: 4.6 10*3/uL (ref 4.0–10.5)
nRBC: 0 % (ref 0.0–0.2)

## 2017-11-30 LAB — CMP (CANCER CENTER ONLY)
ALT: 19 U/L (ref 0–44)
AST: 12 U/L — AB (ref 15–41)
Albumin: 3.4 g/dL — ABNORMAL LOW (ref 3.5–5.0)
Alkaline Phosphatase: 68 U/L (ref 38–126)
Anion gap: 7 (ref 5–15)
BUN: 19 mg/dL (ref 8–23)
CHLORIDE: 109 mmol/L (ref 98–111)
CO2: 28 mmol/L (ref 22–32)
CREATININE: 0.77 mg/dL (ref 0.44–1.00)
Calcium: 9 mg/dL (ref 8.9–10.3)
GFR, Est AFR Am: >60 mL/min (ref >60)
Glucose, Bld: 110 mg/dL — ABNORMAL HIGH (ref 70–99)
Potassium: 4.1 mmol/L (ref 3.5–5.1)
Sodium: 144 mmol/L (ref 135–145)
Total Bilirubin: 0.4 mg/dL (ref 0.3–1.2)
Total Protein: 6.1 g/dL — ABNORMAL LOW (ref 6.5–8.1)

## 2017-11-30 LAB — RESEARCH LABS

## 2017-11-30 MED ORDER — DEXAMETHASONE SODIUM PHOSPHATE 10 MG/ML IJ SOLN
INTRAMUSCULAR | Status: AC
  Filled 2017-11-30: qty 1

## 2017-11-30 MED ORDER — DIPHENHYDRAMINE HCL 50 MG/ML IJ SOLN
25.0000 mg | Freq: Once | INTRAMUSCULAR | Status: AC
  Administered 2017-11-30: 25 mg via INTRAVENOUS

## 2017-11-30 MED ORDER — HEPARIN SOD (PORK) LOCK FLUSH 100 UNIT/ML IV SOLN
500.0000 [IU] | Freq: Once | INTRAVENOUS | Status: AC | PRN
  Administered 2017-11-30: 500 [IU]
  Filled 2017-11-30: qty 5

## 2017-11-30 MED ORDER — ACETAMINOPHEN 325 MG PO TABS
ORAL_TABLET | ORAL | Status: AC
  Filled 2017-11-30: qty 2

## 2017-11-30 MED ORDER — DIPHENHYDRAMINE HCL 50 MG/ML IJ SOLN
INTRAMUSCULAR | Status: AC
  Filled 2017-11-30: qty 1

## 2017-11-30 MED ORDER — SODIUM CHLORIDE 0.9% FLUSH
10.0000 mL | Freq: Once | INTRAVENOUS | Status: AC
  Administered 2017-11-30: 10 mL
  Filled 2017-11-30: qty 10

## 2017-11-30 MED ORDER — SODIUM CHLORIDE 0.9 % IV SOLN
Freq: Once | INTRAVENOUS | Status: AC
  Administered 2017-11-30: 12:00:00 via INTRAVENOUS
  Filled 2017-11-30: qty 250

## 2017-11-30 MED ORDER — TRASTUZUMAB CHEMO 150 MG IV SOLR
2.0000 mg/kg | Freq: Once | INTRAVENOUS | Status: AC
  Administered 2017-11-30: 189 mg via INTRAVENOUS
  Filled 2017-11-30: qty 9

## 2017-11-30 MED ORDER — ACETAMINOPHEN 325 MG PO TABS
650.0000 mg | ORAL_TABLET | Freq: Once | ORAL | Status: AC
  Administered 2017-11-30: 650 mg via ORAL

## 2017-11-30 MED ORDER — FAMOTIDINE IN NACL 20-0.9 MG/50ML-% IV SOLN
20.0000 mg | Freq: Once | INTRAVENOUS | Status: AC
  Administered 2017-11-30: 20 mg via INTRAVENOUS

## 2017-11-30 MED ORDER — SODIUM CHLORIDE 0.9 % IV SOLN
80.0000 mg/m2 | Freq: Once | INTRAVENOUS | Status: AC
  Administered 2017-11-30: 168 mg via INTRAVENOUS
  Filled 2017-11-30: qty 28

## 2017-11-30 MED ORDER — DEXAMETHASONE SODIUM PHOSPHATE 10 MG/ML IJ SOLN
10.0000 mg | Freq: Once | INTRAMUSCULAR | Status: AC
  Administered 2017-11-30: 10 mg via INTRAVENOUS

## 2017-11-30 MED ORDER — SODIUM CHLORIDE 0.9% FLUSH
10.0000 mL | INTRAVENOUS | Status: DC | PRN
  Administered 2017-11-30: 10 mL
  Filled 2017-11-30: qty 10

## 2017-11-30 MED ORDER — FAMOTIDINE IN NACL 20-0.9 MG/50ML-% IV SOLN
INTRAVENOUS | Status: AC
  Filled 2017-11-30: qty 50

## 2017-11-30 NOTE — Telephone Encounter (Signed)
No 11/18 los or referrals.

## 2017-11-30 NOTE — Assessment & Plan Note (Signed)
09/11/2017:Left lumpectomy: Grade 2 invasive lobular cancer, 1.1 cm, LCIS, margins negative, 0/2 lymph nodes negative, ER 90% strong staining, PR negative, HER-2 positive ratio 2.62, Ki-67 1%, T1c N0 stage Ia  Pathology counseling: I discussed the final pathology report of the patient provided a copy of this report. I discussed the margins as well as lymph node surgeries. We also discussed the final staging along with previously performed ER/PR and HER-2/neu testing.  Recommendation: 1.Adjuvant therapy with Taxol Herceptin weekly x12 followed by Herceptin maintenance for 1 year 2.Adjuvant radiation 3.Followed by adjuvant antiestrogen therapy ------------------------------------------------------------------ Current treatment: Cycle 5 day 1 Taxol Herceptin  Echocardiogram: 08/17/2017 EF 55 to 60%  Chemo toxicities: 1.  Diarrhea: Could be related to her irritable bowel syndrome rather than her treatment. Instructed her on increasing her fluid intake. 2. Fatigue: Patient was previously fatigued even prior to starting chemotherapy.  labs reviewed  Return to clinic weekly for chemo and every 2 weeks for follow-up with me

## 2017-11-30 NOTE — Patient Instructions (Signed)
[facility] Discharge Instructions for Patients Receiving Chemotherapy  Today you received the following chemotherapy agents Taxol and Herceptin  To help prevent nausea and vomiting after your treatment, we encourage you to take your nausea medication as directed   If you develop nausea and vomiting that is not controlled by your nausea medication, call the clinic.   BELOW ARE SYMPTOMS THAT SHOULD BE REPORTED IMMEDIATELY:  *FEVER GREATER THAN 100.5 F  *CHILLS WITH OR WITHOUT FEVER  NAUSEA AND VOMITING THAT IS NOT CONTROLLED WITH YOUR NAUSEA MEDICATION  *UNUSUAL SHORTNESS OF BREATH  *UNUSUAL BRUISING OR BLEEDING  TENDERNESS IN MOUTH AND THROAT WITH OR WITHOUT PRESENCE OF ULCERS  *URINARY PROBLEMS  *BOWEL PROBLEMS  UNUSUAL RASH Items with * indicate a potential emergency and should be followed up as soon as possible.  Feel free to call the clinic should you have any questions or concerns. The clinic phone number is [phone].  Please show the CHEMO ALERT CARD at check-in to the Emergency Department and triage nurse.   

## 2017-11-30 NOTE — Progress Notes (Signed)
Patient Care Team: Manon Hilding, MD as PCP - General (Unknown Physician Specialty) Excell Seltzer, MD as Consulting Physician (General Surgery) Nicholas Lose, MD as Consulting Physician (Hematology and Oncology) Gery Pray, MD as Consulting Physician (Radiation Oncology)  DIAGNOSIS:  Encounter Diagnosis  Name Primary?  . Malignant neoplasm of upper-outer quadrant of left breast in female, estrogen receptor positive ([place])     SUMMARY OF ONCOLOGIC HISTORY:   Malignant neoplasm of upper-outer quadrant of left breast in female, estrogen receptor positive ([place])   08/05/2017 Initial Diagnosis    Screening detected left breast calcifications UOQ 1.1 cm biopsy revealed invasive lobular cancer with LCIS and CSL, grade 2, ER 90%, PR 0%, Ki-67 1%, HER-2 positive ratio 2.62, T2N0 stage Ia clinical stage AJCC 8    08/12/2017 Cancer Staging    Staging form: Breast, AJCC 8th Edition - Clinical stage from 08/12/2017: Stage IA (cT1c, cN0, cM0, G2, ER+, PR-, HER2+) - Signed by Nicholas Lose, MD on 08/12/2017    09/11/2017 Surgery    Left lumpectomy: Grade 2 invasive lobular cancer, 1.1 cm, LCIS, margins negative, 0/2 lymph nodes negative, ER 90% strong staining, PR negative, HER-2 positive ratio 2.62, Ki-67 1%, T1c N0 stage Ia    09/23/2017 Cancer Staging    Staging form: Breast, AJCC 8th Edition - Pathologic: Stage IA (pT1c, pN0, cM0, G2, ER+, PR-, HER2+) - Signed by Gardenia Phlegm, NP on 09/23/2017    10/15/2017 -  Chemotherapy    The patient had trastuzumab (HERCEPTIN) 399 mg in sodium chloride 0.9 % 250 mL chemo infusion, 4 mg/kg = 588 mg, Intravenous,  Once, 1 of 3 cycles Administration: 399 mg (11/02/2017), 189 mg (11/16/2017), 189 mg (11/09/2017), 189 mg (11/23/2017) PACLitaxel (TAXOL) 168 mg in sodium chloride 0.9 % 250 mL chemo infusion (</= 64m/m2), 80 mg/m2 = 168 mg, Intravenous,  Once, 1 of 3 cycles Administration: 168 mg (11/02/2017), 168 mg (11/09/2017), 168 mg  (11/16/2017), 168 mg (11/23/2017)  for chemotherapy treatment.      CHIEF COMPLIANT: Cycle 5 Taxol Herceptin  INTERVAL HISTORY: Sylvia GRUDZINSKIis a 72year old with above-mentioned history of HER-2 positive breast cancer treated with lumpectomy and is currently on Taxol Herceptin.  She is tolerating the treatment fairly well.  She has occasional mouth sores.  Denies any nausea vomiting.  She is starting to lose hair.  She also has noticed nosebleeds.  REVIEW OF SYSTEMS:   Constitutional: Denies fevers, chills or abnormal weight loss Eyes: Denies blurriness of vision Ears, nose, mouth, throat, and face: Nosebleeds Respiratory: Denies cough, dyspnea or wheezes Cardiovascular: Denies palpitation, chest discomfort Gastrointestinal:  Denies nausea, heartburn or change in bowel habits Skin: Denies abnormal skin rashes Lymphatics: Denies new lymphadenopathy or easy bruising Neurological:Denies numbness, tingling or new weaknesses Behavioral/Psych: Mood is stable, no new changes  Extremities: No lower extremity edema  All other systems were reviewed with the patient and are negative.  I have reviewed the past medical history, past surgical history, social history and family history with the patient and they are unchanged from previous note.  ALLERGIES:  is allergic to fiorinal-codeine #3 [butalbital-asa-caff-codeine]; lansoprazole; latex; butalbital-aspirin-caffeine; sulfa antibiotics; femara [letrozole]; and sulfasalazine.  MEDICATIONS:  Current Outpatient Medications  Medication Sig Dispense Refill  . atorvastatin (LIPITOR) 10 MG tablet Take 10 mg by mouth at bedtime.    . Calcium Carb-Cholecalciferol (CALCIUM 600+D) 600-800 MG-UNIT TABS Take 1 tablet by mouth daily.     . cyclobenzaprine (FLEXERIL) 10 MG tablet Take 10 mg by mouth  3 (three) times daily as needed for muscle spasms.     Marland Kitchen dicyclomine (BENTYL) 10 MG capsule Take 1 capsule (10 mg total) by mouth 3 (three) times daily as  needed for spasms. Patient states that she started this 3 weeks ago (Patient taking differently: Take 10 mg by mouth daily. ) 270 capsule 1  . escitalopram (LEXAPRO) 5 MG tablet Take 5 mg by mouth daily.    . hydrochlorothiazide (HYDRODIURIL) 25 MG tablet Take 25 mg by mouth daily.    . hydroxypropyl methylcellulose / hypromellose (ISOPTO TEARS / GONIOVISC) 2.5 % ophthalmic solution Place 1 drop into both eyes 3 (three) times daily as needed for dry eyes.    Marland Kitchen lidocaine-prilocaine (EMLA) cream Apply to affected area once 30 g 3  . loperamide (IMODIUM) 2 MG capsule Take 2 mg by mouth daily before breakfast.    . LORazepam (ATIVAN) 0.5 MG tablet Take 1 tablet (0.5 mg total) by mouth at bedtime as needed for sleep. 30 tablet 0  . meloxicam (MOBIC) 7.5 MG tablet Take 7.5 mg by mouth 2 (two) times daily as needed for pain.     . Menthol, Topical Analgesic, (ICY HOT) 5 % PADS Apply 1 patch topically daily as needed (pain).     . ondansetron (ZOFRAN) 8 MG tablet Take 1 tablet (8 mg total) by mouth 2 (two) times daily as needed (Nausea or vomiting). 30 tablet 1  . oxyCODONE (OXY IR/ROXICODONE) 5 MG immediate release tablet Take 1 tablet (5 mg total) by mouth every 6 (six) hours as needed for severe pain. 10 tablet 0  . prochlorperazine (COMPAZINE) 10 MG tablet Take 1 tablet (10 mg total) by mouth every 6 (six) hours as needed (Nausea or vomiting). 30 tablet 1   No current facility-administered medications for this visit.     PHYSICAL EXAMINATION: ECOG PERFORMANCE STATUS: 1 - Symptomatic but completely ambulatory  Vitals:   11/30/17 1123  BP: (!) 169/74  Pulse: 71  Resp: 18  Temp: 97.9 F (36.6 C)  SpO2: 98%   Filed Weights   11/30/17 1123  Weight: 216 lb 8 oz (98.2 kg)    GENERAL:alert, no distress and comfortable SKIN: skin color, texture, turgor are normal, no rashes or significant lesions EYES: normal, Conjunctiva are pink and non-injected, sclera clear OROPHARYNX:no exudate, no  erythema and lips, buccal mucosa, and tongue normal  NECK: supple, thyroid normal size, non-tender, without nodularity LYMPH:  no palpable lymphadenopathy in the cervical, axillary or inguinal LUNGS: clear to auscultation and percussion with normal breathing effort HEART: regular rate & rhythm and no murmurs and no lower extremity edema ABDOMEN:abdomen soft, non-tender and normal bowel sounds MUSCULOSKELETAL:no cyanosis of digits and no clubbing  NEURO: alert & oriented x 3 with fluent speech, no focal motor/sensory deficits EXTREMITIES: No lower extremity edema   LABORATORY DATA:  I have reviewed the data as listed CMP Latest Ref Rng & Units 11/23/2017 11/16/2017 11/09/2017  Glucose 70 - 99 mg/dL 114(H) 94 96  BUN 8 - 23 mg/dL 17 20 18   Creatinine 0.44 - 1.00 mg/dL 0.72 0.76 0.76  Sodium 135 - 145 mmol/L 145 144 144  Potassium 3.5 - 5.1 mmol/L 3.4(L) 3.9 3.8  Chloride 98 - 111 mmol/L 108 107 108  CO2 22 - 32 mmol/L 30 29 28   Calcium 8.9 - 10.3 mg/dL 9.2 9.2 9.3  Total Protein 6.5 - 8.1 g/dL 5.9(L) 6.2(L) 6.5  Total Bilirubin 0.3 - 1.2 mg/dL 0.5 0.3 0.6  Alkaline Phos 38 -  126 U/L 68 71 73  AST 15 - 41 U/L 16 15 15   ALT 0 - 44 U/L 23 26 23     Lab Results  Component Value Date   WBC 4.6 11/30/2017   HGB 11.4 (L) 11/30/2017   HCT 35.0 (L) 11/30/2017   MCV 95.9 11/30/2017   PLT 187 11/30/2017   NEUTROABS 3.3 11/30/2017    ASSESSMENT & PLAN:  Malignant neoplasm of upper-outer quadrant of left breast in female, estrogen receptor positive ([facility]) 09/11/2017:Left lumpectomy: Grade 2 invasive lobular cancer, 1.1 cm, LCIS, margins negative, 0/2 lymph nodes negative, ER 90% strong staining, PR negative, HER-2 positive ratio 2.62, Ki-67 1%, T1c N0 stage Ia  Pathology counseling: I discussed the final pathology report of the patient provided a copy of this report. I discussed the margins as well as lymph node surgeries. We also discussed the final staging along with previously performed  ER/PR and HER-2/neu testing.  Recommendation: 1.Adjuvant therapy with Taxol Herceptin weekly x12 followed by Herceptin maintenance for 1 year 2.Adjuvant radiation 3.Followed by adjuvant antiestrogen therapy ------------------------------------------------------------------ Current treatment: Cycle 5 day 1 Taxol Herceptin  Echocardiogram: 08/17/2017 EF 55 to 60%  Chemo toxicities: 1.  Diarrhea: Could be related to her irritable bowel syndrome rather than her treatment. Instructed her on increasing her fluid intake. 2. Fatigue: Patient was previously fatigued even prior to starting chemotherapy.  labs reviewed 3.  Hypokalemia: Decreasing her oral potassium intake.  Related to diarrhea.  Return to clinic weekly for chemo and every 2 weeks for follow-up with me  No orders of the defined types were placed in this encounter.  The patient has a good understanding of the overall plan. she agrees with it. she will call with any problems that may develop before the next visit here.   Harriette Ohara, MD 11/30/17

## 2017-11-30 NOTE — Research (Signed)
[id number] (UPBEAT) Month One:  11/30/17 This RN met patient Sylvia Barnett in the treatment room.  The patient had her one month study labs drawn this morning as planned.  This RN provided the patient with a copy of the baseline lab results.  The patient denies any questions or needs at this time.  This RN to contact patient in the future to schedule the patient's 3 month visit.  The patient was thanked for her ongoing participation in the UPBEAT study.  Doreatha Martin, RN, BSN, [facility] 11/30/2017 4:55 PM

## 2017-12-02 ENCOUNTER — Encounter: Payer: Self-pay | Admitting: *Deleted

## 2017-12-02 NOTE — Progress Notes (Signed)
UPBEAT Baseline Cardiac MRI:  12/02/17 Baseline Cardiac MRI report received today for Sylvia Barnett.  She is patient [phone] on the UPBEAT study.  Report and finding of uncertain significance reviewed by Dr. Lindi Adie.  Dr. Lindi Adie to enter orders for follow up and her or his nurse will contact patient.  A copy of the cardiac MRI report was provided to his RN.  Doreatha Martin, RN, BSN, [facility] 12/02/2017 2:56 PM

## 2017-12-03 DIAGNOSIS — H35373 Puckering of macula, bilateral: Secondary | ICD-10-CM | POA: Diagnosis not present

## 2017-12-07 ENCOUNTER — Other Ambulatory Visit: Payer: Self-pay

## 2017-12-07 ENCOUNTER — Inpatient Hospital Stay: Payer: Medicare Other

## 2017-12-07 VITALS — BP 162/78 | HR 72 | Temp 98.2°F | Resp 16

## 2017-12-07 DIAGNOSIS — C50412 Malignant neoplasm of upper-outer quadrant of left female breast: Secondary | ICD-10-CM | POA: Diagnosis not present

## 2017-12-07 DIAGNOSIS — Z170 Estrogen receptor positive status [ER+]: Principal | ICD-10-CM

## 2017-12-07 DIAGNOSIS — Z5112 Encounter for antineoplastic immunotherapy: Secondary | ICD-10-CM | POA: Diagnosis not present

## 2017-12-07 DIAGNOSIS — N281 Cyst of kidney, acquired: Secondary | ICD-10-CM

## 2017-12-07 DIAGNOSIS — Z95828 Presence of other vascular implants and grafts: Secondary | ICD-10-CM

## 2017-12-07 DIAGNOSIS — Z5111 Encounter for antineoplastic chemotherapy: Secondary | ICD-10-CM | POA: Diagnosis not present

## 2017-12-07 LAB — CMP (CANCER CENTER ONLY)
ALBUMIN: 3.5 g/dL (ref 3.5–5.0)
ALT: 19 U/L (ref 0–44)
ANION GAP: 7 (ref 5–15)
AST: 14 U/L — ABNORMAL LOW (ref 15–41)
Alkaline Phosphatase: 71 U/L (ref 38–126)
BILIRUBIN TOTAL: 0.4 mg/dL (ref 0.3–1.2)
BUN: 20 mg/dL (ref 8–23)
CALCIUM: 8.9 mg/dL (ref 8.9–10.3)
CO2: 28 mmol/L (ref 22–32)
Chloride: 111 mmol/L (ref 98–111)
Creatinine: 0.77 mg/dL (ref 0.44–1.00)
GLUCOSE: 113 mg/dL — AB (ref 70–99)
Potassium: 3.6 mmol/L (ref 3.5–5.1)
Sodium: 146 mmol/L — ABNORMAL HIGH (ref 135–145)
TOTAL PROTEIN: 6.2 g/dL — AB (ref 6.5–8.1)

## 2017-12-07 LAB — CBC WITH DIFFERENTIAL (CANCER CENTER ONLY)
Abs Immature Granulocytes: 0.03 10*3/uL (ref 0.00–0.07)
BASOS PCT: 0 %
Basophils Absolute: 0 10*3/uL (ref 0.0–0.1)
EOS ABS: 0.1 10*3/uL (ref 0.0–0.5)
Eosinophils Relative: 3 %
HCT: 34.5 % — ABNORMAL LOW (ref 36.0–46.0)
Hemoglobin: 11.2 g/dL — ABNORMAL LOW (ref 12.0–15.0)
Immature Granulocytes: 1 %
Lymphocytes Relative: 19 %
Lymphs Abs: 1 10*3/uL (ref 0.7–4.0)
MCH: 31.1 pg (ref 26.0–34.0)
MCHC: 32.5 g/dL (ref 30.0–36.0)
MCV: 95.8 fL (ref 80.0–100.0)
MONOS PCT: 7 %
Monocytes Absolute: 0.4 10*3/uL (ref 0.1–1.0)
NEUTROS PCT: 70 %
Neutro Abs: 3.5 10*3/uL (ref 1.7–7.7)
PLATELETS: 175 10*3/uL (ref 150–400)
RBC: 3.6 MIL/uL — ABNORMAL LOW (ref 3.87–5.11)
RDW: 14 % (ref 11.5–15.5)
WBC: 5 10*3/uL (ref 4.0–10.5)
nRBC: 0 % (ref 0.0–0.2)

## 2017-12-07 MED ORDER — DIPHENHYDRAMINE HCL 50 MG/ML IJ SOLN
INTRAMUSCULAR | Status: AC
  Filled 2017-12-07: qty 1

## 2017-12-07 MED ORDER — SODIUM CHLORIDE 0.9 % IV SOLN
80.0000 mg/m2 | Freq: Once | INTRAVENOUS | Status: AC
  Administered 2017-12-07: 168 mg via INTRAVENOUS
  Filled 2017-12-07: qty 28

## 2017-12-07 MED ORDER — SODIUM CHLORIDE 0.9% FLUSH
10.0000 mL | Freq: Once | INTRAVENOUS | Status: AC
  Administered 2017-12-07: 10 mL
  Filled 2017-12-07: qty 10

## 2017-12-07 MED ORDER — HEPARIN SOD (PORK) LOCK FLUSH 100 UNIT/ML IV SOLN
500.0000 [IU] | Freq: Once | INTRAVENOUS | Status: AC | PRN
  Administered 2017-12-07: 500 [IU]
  Filled 2017-12-07: qty 5

## 2017-12-07 MED ORDER — FAMOTIDINE IN NACL 20-0.9 MG/50ML-% IV SOLN
20.0000 mg | Freq: Once | INTRAVENOUS | Status: AC
  Administered 2017-12-07: 20 mg via INTRAVENOUS

## 2017-12-07 MED ORDER — SODIUM CHLORIDE 0.9% FLUSH
10.0000 mL | INTRAVENOUS | Status: DC | PRN
  Administered 2017-12-07: 10 mL
  Filled 2017-12-07: qty 10

## 2017-12-07 MED ORDER — SODIUM CHLORIDE 0.9 % IV SOLN
Freq: Once | INTRAVENOUS | Status: AC
  Administered 2017-12-07: 12:00:00 via INTRAVENOUS
  Filled 2017-12-07: qty 250

## 2017-12-07 MED ORDER — DEXAMETHASONE SODIUM PHOSPHATE 10 MG/ML IJ SOLN
10.0000 mg | Freq: Once | INTRAMUSCULAR | Status: AC
  Administered 2017-12-07: 10 mg via INTRAVENOUS

## 2017-12-07 MED ORDER — ACETAMINOPHEN 325 MG PO TABS: 650.0000 mg | ORAL_TABLET | Freq: Once | ORAL | Status: DC

## 2017-12-07 MED ORDER — DEXAMETHASONE SODIUM PHOSPHATE 10 MG/ML IJ SOLN
INTRAMUSCULAR | Status: AC
  Filled 2017-12-07: qty 1

## 2017-12-07 MED ORDER — DIPHENHYDRAMINE HCL 50 MG/ML IJ SOLN
25.0000 mg | Freq: Once | INTRAMUSCULAR | Status: AC
  Administered 2017-12-07: 25 mg via INTRAVENOUS

## 2017-12-07 MED ORDER — FAMOTIDINE IN NACL 20-0.9 MG/50ML-% IV SOLN
INTRAVENOUS | Status: AC
  Filled 2017-12-07: qty 50

## 2017-12-07 MED ORDER — TRASTUZUMAB CHEMO 150 MG IV SOLR
2.0000 mg/kg | Freq: Once | INTRAVENOUS | Status: AC
  Administered 2017-12-07: 189 mg via INTRAVENOUS
  Filled 2017-12-07: qty 9

## 2017-12-07 NOTE — Patient Instructions (Signed)
[facility] Discharge Instructions for Patients Receiving Chemotherapy  Today you received the following chemotherapy agents Taxol and Herceptin  To help prevent nausea and vomiting after your treatment, we encourage you to take your nausea medication as directed   If you develop nausea and vomiting that is not controlled by your nausea medication, call the clinic.   BELOW ARE SYMPTOMS THAT SHOULD BE REPORTED IMMEDIATELY:  *FEVER GREATER THAN 100.5 F  *CHILLS WITH OR WITHOUT FEVER  NAUSEA AND VOMITING THAT IS NOT CONTROLLED WITH YOUR NAUSEA MEDICATION  *UNUSUAL SHORTNESS OF BREATH  *UNUSUAL BRUISING OR BLEEDING  TENDERNESS IN MOUTH AND THROAT WITH OR WITHOUT PRESENCE OF ULCERS  *URINARY PROBLEMS  *BOWEL PROBLEMS  UNUSUAL RASH Items with * indicate a potential emergency and should be followed up as soon as possible.  Feel free to call the clinic should you have any questions or concerns. The clinic phone number is [phone].  Please show the CHEMO ALERT CARD at check-in to the Emergency Department and triage nurse.   

## 2017-12-09 ENCOUNTER — Telehealth: Payer: Self-pay | Admitting: *Deleted

## 2017-12-09 DIAGNOSIS — Z23 Encounter for immunization: Secondary | ICD-10-CM | POA: Diagnosis not present

## 2017-12-09 DIAGNOSIS — S61211A Laceration without foreign body of left index finger without damage to nail, initial encounter: Secondary | ICD-10-CM | POA: Diagnosis not present

## 2017-12-09 NOTE — Telephone Encounter (Signed)
Theo Dills, [id number] [facility] [phone].  Sylvia Barnett cut her finger.  May she receive a tetanus vaccine with the chemotherapy treatment she receives?"  Verbal order received and read back from Dr. Jana Hakim for Sylvia Barnett to receive Tetanus Vaccine.  Order given to Angie at this time.

## 2017-12-11 DIAGNOSIS — S61211A Laceration without foreign body of left index finger without damage to nail, initial encounter: Secondary | ICD-10-CM | POA: Diagnosis not present

## 2017-12-14 ENCOUNTER — Inpatient Hospital Stay: Payer: Medicare Other

## 2017-12-14 ENCOUNTER — Inpatient Hospital Stay: Payer: Medicare Other | Attending: Hematology and Oncology

## 2017-12-14 ENCOUNTER — Ambulatory Visit (HOSPITAL_COMMUNITY)
Admission: RE | Admit: 2017-12-14 | Discharge: 2017-12-14 | Disposition: A | Discharge status: Home / Self Care | Payer: Medicare Other | Source: Ambulatory Visit | Attending: Hematology and Oncology | Admitting: Hematology and Oncology

## 2017-12-14 ENCOUNTER — Inpatient Hospital Stay (HOSPITAL_BASED_OUTPATIENT_CLINIC_OR_DEPARTMENT_OTHER): Payer: Medicare Other | Admitting: Hematology and Oncology

## 2017-12-14 DIAGNOSIS — S61212A Laceration without foreign body of right middle finger without damage to nail, initial encounter: Secondary | ICD-10-CM | POA: Diagnosis not present

## 2017-12-14 DIAGNOSIS — Z170 Estrogen receptor positive status [ER+]: Secondary | ICD-10-CM

## 2017-12-14 DIAGNOSIS — R197 Diarrhea, unspecified: Secondary | ICD-10-CM

## 2017-12-14 DIAGNOSIS — C50412 Malignant neoplasm of upper-outer quadrant of left female breast: Secondary | ICD-10-CM

## 2017-12-14 DIAGNOSIS — Z5111 Encounter for antineoplastic chemotherapy: Secondary | ICD-10-CM | POA: Insufficient documentation

## 2017-12-14 DIAGNOSIS — Z5112 Encounter for antineoplastic immunotherapy: Secondary | ICD-10-CM | POA: Diagnosis not present

## 2017-12-14 DIAGNOSIS — N281 Cyst of kidney, acquired: Secondary | ICD-10-CM | POA: Diagnosis not present

## 2017-12-14 DIAGNOSIS — Z95828 Presence of other vascular implants and grafts: Secondary | ICD-10-CM

## 2017-12-14 DIAGNOSIS — W260XXA Contact with knife, initial encounter: Secondary | ICD-10-CM | POA: Diagnosis not present

## 2017-12-14 DIAGNOSIS — R5383 Other fatigue: Secondary | ICD-10-CM

## 2017-12-14 DIAGNOSIS — S61211A Laceration without foreign body of left index finger without damage to nail, initial encounter: Secondary | ICD-10-CM

## 2017-12-14 DIAGNOSIS — E876 Hypokalemia: Secondary | ICD-10-CM | POA: Diagnosis not present

## 2017-12-14 DIAGNOSIS — S61212D Laceration without foreign body of right middle finger without damage to nail, subsequent encounter: Secondary | ICD-10-CM | POA: Diagnosis not present

## 2017-12-14 DIAGNOSIS — N1330 Unspecified hydronephrosis: Secondary | ICD-10-CM | POA: Insufficient documentation

## 2017-12-14 LAB — CMP (CANCER CENTER ONLY)
ALK PHOS: 71 U/L (ref 38–126)
ALT: 19 U/L (ref 0–44)
AST: 13 U/L — ABNORMAL LOW (ref 15–41)
Albumin: 3.5 g/dL (ref 3.5–5.0)
Anion gap: 7 (ref 5–15)
BUN: 21 mg/dL (ref 8–23)
CALCIUM: 9.1 mg/dL (ref 8.9–10.3)
CHLORIDE: 109 mmol/L (ref 98–111)
CO2: 28 mmol/L (ref 22–32)
CREATININE: 0.83 mg/dL (ref 0.44–1.00)
Glucose, Bld: 82 mg/dL (ref 70–99)
Potassium: 4 mmol/L (ref 3.5–5.1)
Sodium: 144 mmol/L (ref 135–145)
Total Bilirubin: 0.3 mg/dL (ref 0.3–1.2)
Total Protein: 6.2 g/dL — ABNORMAL LOW (ref 6.5–8.1)

## 2017-12-14 LAB — CBC WITH DIFFERENTIAL (CANCER CENTER ONLY)
ABS IMMATURE GRANULOCYTES: 0.03 10*3/uL (ref 0.00–0.07)
Basophils Absolute: 0 10*3/uL (ref 0.0–0.1)
Basophils Relative: 1 %
Eosinophils Absolute: 0.1 10*3/uL (ref 0.0–0.5)
Eosinophils Relative: 2 %
HEMATOCRIT: 32.9 % — AB (ref 36.0–46.0)
HEMOGLOBIN: 10.7 g/dL — AB (ref 12.0–15.0)
Immature Granulocytes: 1 %
LYMPHS ABS: 1 10*3/uL (ref 0.7–4.0)
LYMPHS PCT: 22 %
MCH: 31.1 pg (ref 26.0–34.0)
MCHC: 32.5 g/dL (ref 30.0–36.0)
MCV: 95.6 fL (ref 80.0–100.0)
MONO ABS: 0.4 10*3/uL (ref 0.1–1.0)
MONOS PCT: 9 %
NEUTROS ABS: 3 10*3/uL (ref 1.7–7.7)
NEUTROS PCT: 65 %
PLATELETS: 194 10*3/uL (ref 150–400)
RBC: 3.44 MIL/uL — ABNORMAL LOW (ref 3.87–5.11)
RDW: 14.5 % (ref 11.5–15.5)
WBC Count: 4.6 10*3/uL (ref 4.0–10.5)
nRBC: 0 % (ref 0.0–0.2)

## 2017-12-14 MED ORDER — ACETAMINOPHEN 325 MG PO TABS
ORAL_TABLET | ORAL | Status: AC
  Filled 2017-12-14: qty 2

## 2017-12-14 MED ORDER — HEPARIN SOD (PORK) LOCK FLUSH 100 UNIT/ML IV SOLN
500.0000 [IU] | Freq: Once | INTRAVENOUS | Status: AC | PRN
  Administered 2017-12-14: 500 [IU]
  Filled 2017-12-14: qty 5

## 2017-12-14 MED ORDER — SODIUM CHLORIDE 0.9% FLUSH
10.0000 mL | Freq: Once | INTRAVENOUS | Status: AC
  Administered 2017-12-14: 10 mL
  Filled 2017-12-14: qty 10

## 2017-12-14 MED ORDER — DIPHENHYDRAMINE HCL 50 MG/ML IJ SOLN
25.0000 mg | Freq: Once | INTRAMUSCULAR | Status: AC
  Administered 2017-12-14: 25 mg via INTRAVENOUS

## 2017-12-14 MED ORDER — CEPHALEXIN 500 MG PO CAPS: 500.0000 mg | ORAL_CAPSULE | Freq: Two times a day (BID) | ORAL | 0 refills | Status: DC

## 2017-12-14 MED ORDER — SODIUM CHLORIDE 0.9% FLUSH
10.0000 mL | INTRAVENOUS | Status: DC | PRN
  Administered 2017-12-14: 10 mL
  Filled 2017-12-14: qty 10

## 2017-12-14 MED ORDER — SODIUM CHLORIDE 0.9 % IV SOLN
Freq: Once | INTRAVENOUS | Status: AC
  Administered 2017-12-14: 14:00:00 via INTRAVENOUS
  Filled 2017-12-14: qty 250

## 2017-12-14 MED ORDER — FAMOTIDINE IN NACL 20-0.9 MG/50ML-% IV SOLN
20.0000 mg | Freq: Once | INTRAVENOUS | Status: AC
  Administered 2017-12-14: 20 mg via INTRAVENOUS

## 2017-12-14 MED ORDER — SODIUM CHLORIDE 0.9 % IV SOLN
80.0000 mg/m2 | Freq: Once | INTRAVENOUS | Status: AC
  Administered 2017-12-14: 168 mg via INTRAVENOUS
  Filled 2017-12-14: qty 28

## 2017-12-14 MED ORDER — ACETAMINOPHEN 325 MG PO TABS
650.0000 mg | ORAL_TABLET | Freq: Once | ORAL | Status: AC
  Administered 2017-12-14: 650 mg via ORAL

## 2017-12-14 MED ORDER — DEXAMETHASONE SODIUM PHOSPHATE 10 MG/ML IJ SOLN
INTRAMUSCULAR | Status: AC
  Filled 2017-12-14: qty 1

## 2017-12-14 MED ORDER — FAMOTIDINE IN NACL 20-0.9 MG/50ML-% IV SOLN
INTRAVENOUS | Status: AC
  Filled 2017-12-14: qty 50

## 2017-12-14 MED ORDER — DEXAMETHASONE SODIUM PHOSPHATE 10 MG/ML IJ SOLN
10.0000 mg | Freq: Once | INTRAMUSCULAR | Status: AC
  Administered 2017-12-14: 10 mg via INTRAVENOUS

## 2017-12-14 MED ORDER — DIPHENHYDRAMINE HCL 50 MG/ML IJ SOLN
INTRAMUSCULAR | Status: AC
  Filled 2017-12-14: qty 1

## 2017-12-14 MED ORDER — TRASTUZUMAB CHEMO 150 MG IV SOLR
2.0000 mg/kg | Freq: Once | INTRAVENOUS | Status: AC
  Administered 2017-12-14: 189 mg via INTRAVENOUS
  Filled 2017-12-14: qty 9

## 2017-12-14 NOTE — Patient Instructions (Signed)
[facility] Center Discharge Instructions for Patients Receiving Chemotherapy  Today you received the following chemotherapy agents:  Herceptin and Taxol.  To help prevent nausea and vomiting after your treatment, we encourage you to take your nausea medication as directed.   If you develop nausea and vomiting that is not controlled by your nausea medication, call the clinic.   BELOW ARE SYMPTOMS THAT SHOULD BE REPORTED IMMEDIATELY:  *FEVER GREATER THAN 100.5 F  *CHILLS WITH OR WITHOUT FEVER  NAUSEA AND VOMITING THAT IS NOT CONTROLLED WITH YOUR NAUSEA MEDICATION  *UNUSUAL SHORTNESS OF BREATH  *UNUSUAL BRUISING OR BLEEDING  TENDERNESS IN MOUTH AND THROAT WITH OR WITHOUT PRESENCE OF ULCERS  *URINARY PROBLEMS  *BOWEL PROBLEMS  UNUSUAL RASH Items with * indicate a potential emergency and should be followed up as soon as possible.  Feel free to call the clinic should you have any questions or concerns. The clinic phone number is [phone].  Please show the CHEMO ALERT CARD at check-in to the Emergency Department and triage nurse.   

## 2017-12-14 NOTE — Assessment & Plan Note (Signed)
09/11/2017:Left lumpectomy: Grade 2 invasive lobular cancer, 1.1 cm, LCIS, margins negative, 0/2 lymph nodes negative, ER 90% strong staining, PR negative, HER-2 positive ratio 2.62, Ki-67 1%, T1c N0 stage Ia  Pathology counseling: I discussed the final pathology report of the patient provided a copy of this report. I discussed the margins as well as lymph node surgeries. We also discussed the final staging along with previously performed ER/PR and HER-2/neu testing.  Recommendation: 1.Adjuvant therapy with Taxol Herceptin weekly x12 followed by Herceptin maintenance for 1 year 2.Adjuvant radiation 3.Followed by adjuvant antiestrogen therapy ------------------------------------------------------------------ Current treatment: Cycle7day 1 Taxol Herceptin Echocardiogram: 08/17/2017 EF 55 to 60%  Chemo toxicities: 1.Diarrhea: Could be related to her irritable bowel syndrome rather than her treatment. Instructed her on increasing her fluid intake. 2.Fatigue: Patient was previously fatigued even prior to starting chemotherapy. labs reviewed 3.  Hypokalemia: Decreasing her oral potassium intake.  Related to diarrhea.  Return to clinicweekly for chemo and every 2 weeks for follow-up withme

## 2017-12-14 NOTE — Progress Notes (Signed)
Patient Care Team: Manon Hilding, MD as PCP - General (Unknown Physician Specialty) Excell Seltzer, MD as Consulting Physician (General Surgery) Nicholas Lose, MD as Consulting Physician (Hematology and Oncology) Gery Pray, MD as Consulting Physician (Radiation Oncology)  DIAGNOSIS:  Encounter Diagnosis  Name Primary?  . Malignant neoplasm of upper-outer quadrant of left breast in female, estrogen receptor positive ([place])     SUMMARY OF ONCOLOGIC HISTORY:   Malignant neoplasm of upper-outer quadrant of left breast in female, estrogen receptor positive ([place])   08/05/2017 Initial Diagnosis    Screening detected left breast calcifications UOQ 1.1 cm biopsy revealed invasive lobular cancer with LCIS and CSL, grade 2, ER 90%, PR 0%, Ki-67 1%, HER-2 positive ratio 2.62, T2N0 stage Ia clinical stage AJCC 8    08/12/2017 Cancer Staging    Staging form: Breast, AJCC 8th Edition - Clinical stage from 08/12/2017: Stage IA (cT1c, cN0, cM0, G2, ER+, PR-, HER2+) - Signed by Nicholas Lose, MD on 08/12/2017    09/11/2017 Surgery    Left lumpectomy: Grade 2 invasive lobular cancer, 1.1 cm, LCIS, margins negative, 0/2 lymph nodes negative, ER 90% strong staining, PR negative, HER-2 positive ratio 2.62, Ki-67 1%, T1c N0 stage Ia    09/23/2017 Cancer Staging    Staging form: Breast, AJCC 8th Edition - Pathologic: Stage IA (pT1c, pN0, cM0, G2, ER+, PR-, HER2+) - Signed by Gardenia Phlegm, NP on 09/23/2017    10/15/2017 -  Chemotherapy    The patient had trastuzumab (HERCEPTIN) 399 mg in sodium chloride 0.9 % 250 mL chemo infusion, 4 mg/kg = 588 mg, Intravenous,  Once, 2 of 3 cycles Administration: 399 mg (11/02/2017), 189 mg (11/16/2017), 189 mg (11/09/2017), 189 mg (11/30/2017), 189 mg (11/23/2017), 189 mg (12/07/2017) PACLitaxel (TAXOL) 168 mg in sodium chloride 0.9 % 250 mL chemo infusion (</= 68m/m2), 80 mg/m2 = 168 mg, Intravenous,  Once, 2 of 3 cycles Administration: 168 mg  (11/02/2017), 168 mg (11/09/2017), 168 mg (11/16/2017), 168 mg (11/30/2017), 168 mg (12/07/2017), 168 mg (11/23/2017)  for chemotherapy treatment.      CHIEF COMPLIANT: Cycle 7 Taxol Herceptin  INTERVAL HISTORY: Sylvia KIEHNis a 72year old with above-mentioned history of left breast cancer currently on adjuvant chemo with Taxol Herceptin.  She is tolerating the chemo extremely well.  She does not have neuropathy.  Denies any nausea vomiting.  She did have diarrhea.  During Thanksgiving food preparation she cut her finger and went to the emergency room.  The put some sutures which gave out and she has some Steri-Strips in place.  He does not appear to have healed.  REVIEW OF SYSTEMS:   Constitutional: Denies fevers, chills or abnormal weight loss Eyes: Denies blurriness of vision Ears, nose, mouth, throat, and face: Denies mucositis or sore throat Respiratory: Denies cough, dyspnea or wheezes Cardiovascular: Denies palpitation, chest discomfort Gastrointestinal:  Denies nausea, heartburn or change in bowel habits Skin: Denies abnormal skin rashes Lymphatics: Denies new lymphadenopathy or easy bruising Neurological:Denies numbness, tingling or new weaknesses Behavioral/Psych: Mood is stable, no new changes  Extremities: Recent cut from with a knife causing swelling pain and discomfort in the index finger dorsum side   All other systems were reviewed with the patient and are negative.  I have reviewed the past medical history, past surgical history, social history and family history with the patient and they are unchanged from previous note.  ALLERGIES:  is allergic to fiorinal-codeine #3 [butalbital-asa-caff-codeine]; lansoprazole; latex; butalbital-aspirin-caffeine; sulfa antibiotics; femara [letrozole]; and sulfasalazine.  MEDICATIONS:  Current Outpatient Medications  Medication Sig Dispense Refill  . atorvastatin (LIPITOR) 10 MG tablet Take 10 mg by mouth at bedtime.    .  Calcium Carb-Cholecalciferol (CALCIUM 600+D) 600-800 MG-UNIT TABS Take 1 tablet by mouth daily.     . cyclobenzaprine (FLEXERIL) 10 MG tablet Take 10 mg by mouth 3 (three) times daily as needed for muscle spasms.     Marland Kitchen dicyclomine (BENTYL) 10 MG capsule Take 1 capsule (10 mg total) by mouth 3 (three) times daily as needed for spasms. Patient states that she started this 3 weeks ago (Patient taking differently: Take 10 mg by mouth daily. ) 270 capsule 1  . escitalopram (LEXAPRO) 5 MG tablet Take 5 mg by mouth daily.    . hydrochlorothiazide (HYDRODIURIL) 25 MG tablet Take 25 mg by mouth daily.    . hydroxypropyl methylcellulose / hypromellose (ISOPTO TEARS / GONIOVISC) 2.5 % ophthalmic solution Place 1 drop into both eyes 3 (three) times daily as needed for dry eyes.    Marland Kitchen lidocaine-prilocaine (EMLA) cream Apply to affected area once 30 g 3  . loperamide (IMODIUM) 2 MG capsule Take 2 mg by mouth daily before breakfast.    . LORazepam (ATIVAN) 0.5 MG tablet Take 1 tablet (0.5 mg total) by mouth at bedtime as needed for sleep. 30 tablet 0  . meloxicam (MOBIC) 7.5 MG tablet Take 7.5 mg by mouth 2 (two) times daily as needed for pain.     . Menthol, Topical Analgesic, (ICY HOT) 5 % PADS Apply 1 patch topically daily as needed (pain).     . ondansetron (ZOFRAN) 8 MG tablet Take 1 tablet (8 mg total) by mouth 2 (two) times daily as needed (Nausea or vomiting). 30 tablet 1  . oxyCODONE (OXY IR/ROXICODONE) 5 MG immediate release tablet Take 1 tablet (5 mg total) by mouth every 6 (six) hours as needed for severe pain. 10 tablet 0  . prochlorperazine (COMPAZINE) 10 MG tablet Take 1 tablet (10 mg total) by mouth every 6 (six) hours as needed (Nausea or vomiting). 30 tablet 1   No current facility-administered medications for this visit.     PHYSICAL EXAMINATION: ECOG PERFORMANCE STATUS: 1 - Symptomatic but completely ambulatory  Vitals:   12/14/17 1113  BP: (!) 158/62  Pulse: 60  Resp: 17  Temp: 97.8 F  (36.6 C)  SpO2: 97%   Filed Weights   12/14/17 1113  Weight: 213 lb 14.4 oz (97 kg)    GENERAL:alert, no distress and comfortable SKIN: skin color, texture, turgor are normal, no rashes or significant lesions EYES: normal, Conjunctiva are pink and non-injected, sclera clear OROPHARYNX:no exudate, no erythema and lips, buccal mucosa, and tongue normal  NECK: supple, thyroid normal size, non-tender, without nodularity LYMPH:  no palpable lymphadenopathy in the cervical, axillary or inguinal LUNGS: clear to auscultation and percussion with normal breathing effort HEART: regular rate & rhythm and no murmurs and no lower extremity edema ABDOMEN:abdomen soft, non-tender and normal bowel sounds MUSCULOSKELETAL:no cyanosis of digits and no clubbing  NEURO: alert & oriented x 3 with fluent speech, no focal motor/sensory deficits EXTREMITIES: No lower extremity edema    LABORATORY DATA:  I have reviewed the data as listed CMP Latest Ref Rng & Units 12/07/2017 11/30/2017 11/23/2017  Glucose 70 - 99 mg/dL 113(H) 110(H) 114(H)  BUN 8 - 23 mg/dL 20 19 17   Creatinine 0.44 - 1.00 mg/dL 0.77 0.77 0.72  Sodium 135 - 145 mmol/L 146(H) 144 145  Potassium 3.5 -  5.1 mmol/L 3.6 4.1 3.4(L)  Chloride 98 - 111 mmol/L 111 109 108  CO2 22 - 32 mmol/L 28 28 30   Calcium 8.9 - 10.3 mg/dL 8.9 9.0 9.2  Total Protein 6.5 - 8.1 g/dL 6.2(L) 6.1(L) 5.9(L)  Total Bilirubin 0.3 - 1.2 mg/dL 0.4 0.4 0.5  Alkaline Phos 38 - 126 U/L 71 68 68  AST 15 - 41 U/L 14(L) 12(L) 16  ALT 0 - 44 U/L 19 19 23     Lab Results  Component Value Date   WBC 4.6 12/14/2017   HGB 10.7 (L) 12/14/2017   HCT 32.9 (L) 12/14/2017   MCV 95.6 12/14/2017   PLT 194 12/14/2017   NEUTROABS 3.0 12/14/2017    ASSESSMENT & PLAN:  Malignant neoplasm of upper-outer quadrant of left breast in female, estrogen receptor positive ([facility]) 09/11/2017:Left lumpectomy: Grade 2 invasive lobular cancer, 1.1 cm, LCIS, margins negative, 0/2 lymph nodes  negative, ER 90% strong staining, PR negative, HER-2 positive ratio 2.62, Ki-67 1%, T1c N0 stage Ia  Pathology counseling: I discussed the final pathology report of the patient provided a copy of this report. I discussed the margins as well as lymph node surgeries. We also discussed the final staging along with previously performed ER/PR and HER-2/neu testing.  Recommendation: 1.Adjuvant therapy with Taxol Herceptin weekly x12 followed by Herceptin maintenance for 1 year 2.Adjuvant radiation 3.Followed by adjuvant antiestrogen therapy ------------------------------------------------------------------ Current treatment: Cycle7day 1 Taxol Herceptin Echocardiogram: 08/17/2017 EF 55 to 60%  Chemo toxicities: 1.Diarrhea: Could be related to her irritable bowel syndrome rather than her treatment. Instructed her on increasing her fluid intake. 2.Fatigue: Patient was previously fatigued even prior to starting chemotherapy. labs reviewed 3.  Hypokalemia: Decreasing her oral potassium intake.  Related to diarrhea.  Knife injury on the dorsum of her left index finger: It appears to be swollen and slightly erythematous.  We will prescribe her Keflex antibiotic.  I also refer her to see her surgeon for them to take a look at the sutures.  Return to clinicweekly for chemo and every 2 weeks for follow-up withme    No orders of the defined types were placed in this encounter.  The patient has a good understanding of the overall plan. she agrees with it. she will call with any problems that may develop before the next visit here.   Harriette Ohara, MD 12/14/17

## 2017-12-15 ENCOUNTER — Ambulatory Visit
Admission: RE | Admit: 2017-12-15 | Discharge: 2017-12-15 | Disposition: A | Discharge status: Home / Self Care | Payer: Self-pay | Source: Ambulatory Visit | Attending: Hematology and Oncology | Admitting: Hematology and Oncology

## 2017-12-15 ENCOUNTER — Other Ambulatory Visit: Payer: Self-pay | Admitting: Hematology and Oncology

## 2017-12-15 ENCOUNTER — Telehealth: Payer: Self-pay | Admitting: Hematology and Oncology

## 2017-12-15 DIAGNOSIS — C801 Malignant (primary) neoplasm, unspecified: Secondary | ICD-10-CM

## 2017-12-15 NOTE — Telephone Encounter (Signed)
No 12/2 los.   

## 2017-12-21 ENCOUNTER — Inpatient Hospital Stay: Payer: Medicare Other

## 2017-12-21 VITALS — BP 156/76 | HR 64 | Temp 97.9°F | Resp 17 | Wt 215.0 lb

## 2017-12-21 DIAGNOSIS — Z5111 Encounter for antineoplastic chemotherapy: Secondary | ICD-10-CM | POA: Diagnosis not present

## 2017-12-21 DIAGNOSIS — Z170 Estrogen receptor positive status [ER+]: Secondary | ICD-10-CM

## 2017-12-21 DIAGNOSIS — C50412 Malignant neoplasm of upper-outer quadrant of left female breast: Secondary | ICD-10-CM | POA: Diagnosis not present

## 2017-12-21 DIAGNOSIS — Z5112 Encounter for antineoplastic immunotherapy: Secondary | ICD-10-CM | POA: Diagnosis not present

## 2017-12-21 DIAGNOSIS — Z95828 Presence of other vascular implants and grafts: Secondary | ICD-10-CM

## 2017-12-21 LAB — CBC WITH DIFFERENTIAL (CANCER CENTER ONLY)
Abs Immature Granulocytes: 0.03 10*3/uL (ref 0.00–0.07)
BASOS PCT: 1 %
Basophils Absolute: 0 10*3/uL (ref 0.0–0.1)
Eosinophils Absolute: 0.1 10*3/uL (ref 0.0–0.5)
Eosinophils Relative: 2 %
HCT: 32.8 % — ABNORMAL LOW (ref 36.0–46.0)
Hemoglobin: 10.8 g/dL — ABNORMAL LOW (ref 12.0–15.0)
Immature Granulocytes: 1 %
Lymphocytes Relative: 19 %
Lymphs Abs: 0.8 10*3/uL (ref 0.7–4.0)
MCH: 31.4 pg (ref 26.0–34.0)
MCHC: 32.9 g/dL (ref 30.0–36.0)
MCV: 95.3 fL (ref 80.0–100.0)
Monocytes Absolute: 0.4 10*3/uL (ref 0.1–1.0)
Monocytes Relative: 9 %
Neutro Abs: 3 10*3/uL (ref 1.7–7.7)
Neutrophils Relative %: 68 %
PLATELETS: 194 10*3/uL (ref 150–400)
RBC: 3.44 MIL/uL — ABNORMAL LOW (ref 3.87–5.11)
RDW: 14.7 % (ref 11.5–15.5)
WBC Count: 4.4 10*3/uL (ref 4.0–10.5)
nRBC: 0 % (ref 0.0–0.2)

## 2017-12-21 LAB — CMP (CANCER CENTER ONLY)
ALT: 19 U/L (ref 0–44)
AST: 13 U/L — ABNORMAL LOW (ref 15–41)
Albumin: 3.3 g/dL — ABNORMAL LOW (ref 3.5–5.0)
Alkaline Phosphatase: 69 U/L (ref 38–126)
Anion gap: 8 (ref 5–15)
BUN: 19 mg/dL (ref 8–23)
CO2: 28 mmol/L (ref 22–32)
Calcium: 8.9 mg/dL (ref 8.9–10.3)
Chloride: 108 mmol/L (ref 98–111)
Creatinine: 0.77 mg/dL (ref 0.44–1.00)
GFR, Est AFR Am: >60 mL/min (ref >60)
Glucose, Bld: 108 mg/dL — ABNORMAL HIGH (ref 70–99)
Potassium: 3.7 mmol/L (ref 3.5–5.1)
Sodium: 144 mmol/L (ref 135–145)
Total Bilirubin: 0.4 mg/dL (ref 0.3–1.2)
Total Protein: 5.9 g/dL — ABNORMAL LOW (ref 6.5–8.1)

## 2017-12-21 MED ORDER — HEPARIN SOD (PORK) LOCK FLUSH 100 UNIT/ML IV SOLN
500.0000 [IU] | Freq: Once | INTRAVENOUS | Status: AC | PRN
  Administered 2017-12-21: 500 [IU]
  Filled 2017-12-21: qty 5

## 2017-12-21 MED ORDER — SODIUM CHLORIDE 0.9 % IV SOLN
80.0000 mg/m2 | Freq: Once | INTRAVENOUS | Status: AC
  Administered 2017-12-21: 168 mg via INTRAVENOUS
  Filled 2017-12-21: qty 28

## 2017-12-21 MED ORDER — FAMOTIDINE IN NACL 20-0.9 MG/50ML-% IV SOLN
INTRAVENOUS | Status: AC
  Filled 2017-12-21: qty 50

## 2017-12-21 MED ORDER — ACETAMINOPHEN 325 MG PO TABS
ORAL_TABLET | ORAL | Status: AC
  Filled 2017-12-21: qty 2

## 2017-12-21 MED ORDER — SODIUM CHLORIDE 0.9% FLUSH
10.0000 mL | INTRAVENOUS | Status: DC | PRN
  Administered 2017-12-21: 10 mL
  Filled 2017-12-21: qty 10

## 2017-12-21 MED ORDER — SODIUM CHLORIDE 0.9 % IV SOLN
Freq: Once | INTRAVENOUS | Status: AC
  Administered 2017-12-21: 12:00:00 via INTRAVENOUS
  Filled 2017-12-21: qty 250

## 2017-12-21 MED ORDER — ACETAMINOPHEN 325 MG PO TABS
650.0000 mg | ORAL_TABLET | Freq: Once | ORAL | Status: AC
  Administered 2017-12-21: 650 mg via ORAL

## 2017-12-21 MED ORDER — DEXAMETHASONE SODIUM PHOSPHATE 10 MG/ML IJ SOLN
INTRAMUSCULAR | Status: AC
  Filled 2017-12-21: qty 1

## 2017-12-21 MED ORDER — DIPHENHYDRAMINE HCL 50 MG/ML IJ SOLN
25.0000 mg | Freq: Once | INTRAMUSCULAR | Status: AC
  Administered 2017-12-21: 25 mg via INTRAVENOUS

## 2017-12-21 MED ORDER — TRASTUZUMAB CHEMO 150 MG IV SOLR
2.0000 mg/kg | Freq: Once | INTRAVENOUS | Status: AC
  Administered 2017-12-21: 189 mg via INTRAVENOUS
  Filled 2017-12-21: qty 9

## 2017-12-21 MED ORDER — DEXAMETHASONE SODIUM PHOSPHATE 10 MG/ML IJ SOLN
10.0000 mg | Freq: Once | INTRAMUSCULAR | Status: AC
  Administered 2017-12-21: 10 mg via INTRAVENOUS

## 2017-12-21 MED ORDER — DIPHENHYDRAMINE HCL 50 MG/ML IJ SOLN
INTRAMUSCULAR | Status: AC
  Filled 2017-12-21: qty 1

## 2017-12-21 MED ORDER — SODIUM CHLORIDE 0.9% FLUSH
10.0000 mL | Freq: Once | INTRAVENOUS | Status: AC
  Administered 2017-12-21: 10 mL
  Filled 2017-12-21: qty 10

## 2017-12-21 MED ORDER — FAMOTIDINE IN NACL 20-0.9 MG/50ML-% IV SOLN
20.0000 mg | Freq: Once | INTRAVENOUS | Status: AC
  Administered 2017-12-21: 20 mg via INTRAVENOUS

## 2017-12-21 NOTE — Patient Instructions (Signed)
[facility] Discharge Instructions for Patients Receiving Chemotherapy  Today you received the following chemotherapy agents Taxol and Herceptin  To help prevent nausea and vomiting after your treatment, we encourage you to take your nausea medication as directed   If you develop nausea and vomiting that is not controlled by your nausea medication, call the clinic.   BELOW ARE SYMPTOMS THAT SHOULD BE REPORTED IMMEDIATELY:  *FEVER GREATER THAN 100.5 F  *CHILLS WITH OR WITHOUT FEVER  NAUSEA AND VOMITING THAT IS NOT CONTROLLED WITH YOUR NAUSEA MEDICATION  *UNUSUAL SHORTNESS OF BREATH  *UNUSUAL BRUISING OR BLEEDING  TENDERNESS IN MOUTH AND THROAT WITH OR WITHOUT PRESENCE OF ULCERS  *URINARY PROBLEMS  *BOWEL PROBLEMS  UNUSUAL RASH Items with * indicate a potential emergency and should be followed up as soon as possible.  Feel free to call the clinic should you have any questions or concerns. The clinic phone number is [phone].  Please show the CHEMO ALERT CARD at check-in to the Emergency Department and triage nurse.   

## 2017-12-28 ENCOUNTER — Inpatient Hospital Stay: Payer: Medicare Other

## 2017-12-28 ENCOUNTER — Inpatient Hospital Stay (HOSPITAL_BASED_OUTPATIENT_CLINIC_OR_DEPARTMENT_OTHER): Payer: Medicare Other | Admitting: Hematology and Oncology

## 2017-12-28 DIAGNOSIS — C50412 Malignant neoplasm of upper-outer quadrant of left female breast: Secondary | ICD-10-CM

## 2017-12-28 DIAGNOSIS — K589 Irritable bowel syndrome without diarrhea: Secondary | ICD-10-CM

## 2017-12-28 DIAGNOSIS — Z5112 Encounter for antineoplastic immunotherapy: Secondary | ICD-10-CM | POA: Diagnosis not present

## 2017-12-28 DIAGNOSIS — Z170 Estrogen receptor positive status [ER+]: Secondary | ICD-10-CM

## 2017-12-28 DIAGNOSIS — S61211A Laceration without foreign body of left index finger without damage to nail, initial encounter: Secondary | ICD-10-CM | POA: Diagnosis not present

## 2017-12-28 DIAGNOSIS — E876 Hypokalemia: Secondary | ICD-10-CM | POA: Diagnosis not present

## 2017-12-28 DIAGNOSIS — Z95828 Presence of other vascular implants and grafts: Secondary | ICD-10-CM

## 2017-12-28 DIAGNOSIS — R197 Diarrhea, unspecified: Secondary | ICD-10-CM | POA: Diagnosis not present

## 2017-12-28 DIAGNOSIS — W260XXA Contact with knife, initial encounter: Secondary | ICD-10-CM | POA: Diagnosis not present

## 2017-12-28 DIAGNOSIS — Z5111 Encounter for antineoplastic chemotherapy: Secondary | ICD-10-CM | POA: Diagnosis not present

## 2017-12-28 DIAGNOSIS — R5383 Other fatigue: Secondary | ICD-10-CM

## 2017-12-28 LAB — CBC WITH DIFFERENTIAL (CANCER CENTER ONLY)
Abs Immature Granulocytes: 0.02 10*3/uL (ref 0.00–0.07)
Basophils Absolute: 0 10*3/uL (ref 0.0–0.1)
Basophils Relative: 1 %
EOS ABS: 0.1 10*3/uL (ref 0.0–0.5)
Eosinophils Relative: 2 %
HCT: 32.5 % — ABNORMAL LOW (ref 36.0–46.0)
Hemoglobin: 10.7 g/dL — ABNORMAL LOW (ref 12.0–15.0)
Immature Granulocytes: 1 %
Lymphocytes Relative: 20 %
Lymphs Abs: 0.8 10*3/uL (ref 0.7–4.0)
MCH: 31.8 pg (ref 26.0–34.0)
MCHC: 32.9 g/dL (ref 30.0–36.0)
MCV: 96.7 fL (ref 80.0–100.0)
Monocytes Absolute: 0.4 10*3/uL (ref 0.1–1.0)
Monocytes Relative: 10 %
Neutro Abs: 2.7 10*3/uL (ref 1.7–7.7)
Neutrophils Relative %: 66 %
Platelet Count: 185 10*3/uL (ref 150–400)
RBC: 3.36 MIL/uL — ABNORMAL LOW (ref 3.87–5.11)
RDW: 15.1 % (ref 11.5–15.5)
WBC: 4 10*3/uL (ref 4.0–10.5)
nRBC: 0 % (ref 0.0–0.2)

## 2017-12-28 LAB — CMP (CANCER CENTER ONLY)
ALT: 19 U/L (ref 0–44)
AST: 13 U/L — ABNORMAL LOW (ref 15–41)
Albumin: 3.2 g/dL — ABNORMAL LOW (ref 3.5–5.0)
Alkaline Phosphatase: 67 U/L (ref 38–126)
Anion gap: 7 (ref 5–15)
BUN: 17 mg/dL (ref 8–23)
CO2: 29 mmol/L (ref 22–32)
Calcium: 8.7 mg/dL — ABNORMAL LOW (ref 8.9–10.3)
Chloride: 109 mmol/L (ref 98–111)
Creatinine: 0.72 mg/dL (ref 0.44–1.00)
GFR, Est AFR Am: >60 mL/min (ref >60)
Glucose, Bld: 112 mg/dL — ABNORMAL HIGH (ref 70–99)
Potassium: 3.2 mmol/L — ABNORMAL LOW (ref 3.5–5.1)
Sodium: 145 mmol/L (ref 135–145)
Total Bilirubin: 0.6 mg/dL (ref 0.3–1.2)
Total Protein: 5.9 g/dL — ABNORMAL LOW (ref 6.5–8.1)

## 2017-12-28 MED ORDER — FAMOTIDINE IN NACL 20-0.9 MG/50ML-% IV SOLN
20.0000 mg | Freq: Once | INTRAVENOUS | Status: AC
  Administered 2017-12-28: 20 mg via INTRAVENOUS

## 2017-12-28 MED ORDER — DIPHENHYDRAMINE HCL 50 MG/ML IJ SOLN
INTRAMUSCULAR | Status: AC
  Filled 2017-12-28: qty 1

## 2017-12-28 MED ORDER — SODIUM CHLORIDE 0.9 % IV SOLN
Freq: Once | INTRAVENOUS | Status: DC
  Filled 2017-12-28: qty 250

## 2017-12-28 MED ORDER — DEXAMETHASONE SODIUM PHOSPHATE 10 MG/ML IJ SOLN
INTRAMUSCULAR | Status: AC
  Filled 2017-12-28: qty 1

## 2017-12-28 MED ORDER — SODIUM CHLORIDE 0.9% FLUSH
10.0000 mL | Freq: Once | INTRAVENOUS | Status: AC
  Administered 2017-12-28: 10 mL
  Filled 2017-12-28: qty 10

## 2017-12-28 MED ORDER — SODIUM CHLORIDE 0.9 % IV SOLN
80.0000 mg/m2 | Freq: Once | INTRAVENOUS | Status: AC
  Administered 2017-12-28: 168 mg via INTRAVENOUS
  Filled 2017-12-28: qty 28

## 2017-12-28 MED ORDER — SODIUM CHLORIDE 0.9% FLUSH
10.0000 mL | INTRAVENOUS | Status: DC | PRN
  Filled 2017-12-28: qty 10

## 2017-12-28 MED ORDER — HEPARIN SOD (PORK) LOCK FLUSH 100 UNIT/ML IV SOLN
500.0000 [IU] | Freq: Once | INTRAVENOUS | Status: DC | PRN
  Filled 2017-12-28: qty 5

## 2017-12-28 MED ORDER — DEXAMETHASONE SODIUM PHOSPHATE 10 MG/ML IJ SOLN
10.0000 mg | Freq: Once | INTRAMUSCULAR | Status: AC
  Administered 2017-12-28: 10 mg via INTRAVENOUS

## 2017-12-28 MED ORDER — ACETAMINOPHEN 325 MG PO TABS
ORAL_TABLET | ORAL | Status: AC
  Filled 2017-12-28: qty 2

## 2017-12-28 MED ORDER — DIPHENHYDRAMINE HCL 50 MG/ML IJ SOLN
25.0000 mg | Freq: Once | INTRAMUSCULAR | Status: AC
  Administered 2017-12-28: 25 mg via INTRAVENOUS

## 2017-12-28 MED ORDER — ACETAMINOPHEN 325 MG PO TABS
650.0000 mg | ORAL_TABLET | Freq: Once | ORAL | Status: AC
  Administered 2017-12-28: 650 mg via ORAL

## 2017-12-28 MED ORDER — FAMOTIDINE IN NACL 20-0.9 MG/50ML-% IV SOLN
INTRAVENOUS | Status: AC
  Filled 2017-12-28: qty 50

## 2017-12-28 MED ORDER — TRASTUZUMAB CHEMO 150 MG IV SOLR
2.0000 mg/kg | Freq: Once | INTRAVENOUS | Status: AC
  Administered 2017-12-28: 189 mg via INTRAVENOUS
  Filled 2017-12-28: qty 9

## 2017-12-28 NOTE — Assessment & Plan Note (Signed)
09/11/2017:Left lumpectomy: Grade 2 invasive lobular cancer, 1.1 cm, LCIS, margins negative, 0/2 lymph nodes negative, ER 90% strong staining, PR negative, HER-2 positive ratio 2.62, Ki-67 1%, T1c N0 stage Ia  Pathology counseling: I discussed the final pathology report of the patient provided a copy of this report. I discussed the margins as well as lymph node surgeries. We also discussed the final staging along with previously performed ER/PR and HER-2/neu testing.  Recommendation: 1.Adjuvant therapy with Taxol Herceptin weekly x12 followed by Herceptin maintenance for 1 year 2.Adjuvant radiation 3.Followed by adjuvant antiestrogen therapy ------------------------------------------------------------------ Current treatment: Cycle9day 1 Taxol Herceptin Echocardiogram: 08/17/2017 EF 55 to 60%  Chemo toxicities: 1.Diarrhea: Could be related to her irritable bowel syndrome rather than her treatment. Instructed her on increasing her fluid intake. 2.Fatigue: Patient was previously fatigued even prior to starting chemotherapy. labs reviewed 3.Hypokalemia: Decreasing her oral potassium intake. Related to diarrhea.  Knife injury on the dorsum of her left index finger: It appears to be swollen and slightly erythematous.  We will prescribe her Keflex antibiotic.  I also refer her to see her surgeon for them to take a look at the sutures.  Return to clinicweekly for chemo and every 2 weeks for follow-up withme 

## 2017-12-28 NOTE — Patient Instructions (Signed)
[facility] Discharge Instructions for Patients Receiving Chemotherapy  Today you received the following chemotherapy agents Taxol and Herceptin  To help prevent nausea and vomiting after your treatment, we encourage you to take your nausea medication as directed   If you develop nausea and vomiting that is not controlled by your nausea medication, call the clinic.   BELOW ARE SYMPTOMS THAT SHOULD BE REPORTED IMMEDIATELY:  *FEVER GREATER THAN 100.5 F  *CHILLS WITH OR WITHOUT FEVER  NAUSEA AND VOMITING THAT IS NOT CONTROLLED WITH YOUR NAUSEA MEDICATION  *UNUSUAL SHORTNESS OF BREATH  *UNUSUAL BRUISING OR BLEEDING  TENDERNESS IN MOUTH AND THROAT WITH OR WITHOUT PRESENCE OF ULCERS  *URINARY PROBLEMS  *BOWEL PROBLEMS  UNUSUAL RASH Items with * indicate a potential emergency and should be followed up as soon as possible.  Feel free to call the clinic should you have any questions or concerns. The clinic phone number is [phone].  Please show the CHEMO ALERT CARD at check-in to the Emergency Department and triage nurse.   

## 2017-12-28 NOTE — Progress Notes (Signed)
Patient Care Team: Sylvia Hilding, MD as PCP - General (Unknown Physician Specialty) Excell Seltzer, MD as Consulting Physician (General Surgery) Nicholas Lose, MD as Consulting Physician (Hematology and Oncology) Gery Pray, MD as Consulting Physician (Radiation Oncology)  DIAGNOSIS:  Encounter Diagnosis  Name Primary?  . Malignant neoplasm of upper-outer quadrant of left breast in female, estrogen receptor positive ([place])     SUMMARY OF ONCOLOGIC HISTORY:   Malignant neoplasm of upper-outer quadrant of left breast in female, estrogen receptor positive ([place])   08/05/2017 Initial Diagnosis    Screening detected left breast calcifications UOQ 1.1 cm biopsy revealed invasive lobular cancer with LCIS and CSL, grade 2, ER 90%, PR 0%, Ki-67 1%, HER-2 positive ratio 2.62, T2N0 stage Ia clinical stage AJCC 8    08/12/2017 Cancer Staging    Staging form: Breast, AJCC 8th Edition - Clinical stage from 08/12/2017: Stage IA (cT1c, cN0, cM0, G2, ER+, PR-, HER2+) - Signed by Nicholas Lose, MD on 08/12/2017    09/11/2017 Surgery    Left lumpectomy: Grade 2 invasive lobular cancer, 1.1 cm, LCIS, margins negative, 0/2 lymph nodes negative, ER 90% strong staining, PR negative, HER-2 positive ratio 2.62, Ki-67 1%, T1c N0 stage Ia    09/23/2017 Cancer Staging    Staging form: Breast, AJCC 8th Edition - Pathologic: Stage IA (pT1c, pN0, cM0, G2, ER+, PR-, HER2+) - Signed by Gardenia Phlegm, NP on 09/23/2017    10/15/2017 -  Chemotherapy    The patient had trastuzumab (HERCEPTIN) 399 mg in sodium chloride 0.9 % 250 mL chemo infusion, 4 mg/kg = 588 mg, Intravenous,  Once, 2 of 3 cycles Administration: 399 mg (11/02/2017), 189 mg (11/16/2017), 189 mg (11/09/2017), 189 mg (11/30/2017), 189 mg (11/23/2017), 189 mg (12/07/2017), 189 mg (12/14/2017), 189 mg (12/21/2017) PACLitaxel (TAXOL) 168 mg in sodium chloride 0.9 % 250 mL chemo infusion (</= 43m/m2), 80 mg/m2 = 168 mg, Intravenous,  Once, 2 of 3  cycles Administration: 168 mg (11/02/2017), 168 mg (11/09/2017), 168 mg (11/16/2017), 168 mg (11/30/2017), 168 mg (12/07/2017), 168 mg (12/14/2017), 168 mg (11/23/2017), 168 mg (12/21/2017)  for chemotherapy treatment.      CHIEF COMPLIANT: Taxol cycle 9  INTERVAL HISTORY: Sylvia CLINKENBEARDis a 70-yearwith above-mentioned history of left breast cancer treated with lumpectomy and is now on adjuvant Taxol Herceptin.  She is tolerating the chemo extremely well.  She denies neuropathy.  Denies any nausea vomiting.  She did lose significant amount of hair but not completely.  REVIEW OF SYSTEMS:   Constitutional: Denies fevers, chills or abnormal weight loss Eyes: Denies blurriness of vision Ears, nose, mouth, throat, and face: Denies mucositis or sore throat Respiratory: Denies cough, dyspnea or wheezes Cardiovascular: Denies palpitation, chest discomfort Gastrointestinal:  Denies nausea, heartburn or change in bowel habits Skin: Denies abnormal skin rashes Lymphatics: Denies new lymphadenopathy or easy bruising Neurological:Denies numbness, tingling or new weaknesses Behavioral/Psych: Mood is stable, no new changes  Extremities: No lower extremity edema Breast:  denies any pain or lumps or nodules in either breasts All other systems were reviewed with the patient and are negative.  I have reviewed the past medical history, past surgical history, social history and family history with the patient and they are unchanged from previous note.  ALLERGIES:  is allergic to fiorinal-codeine #3 [butalbital-asa-caff-codeine]; lansoprazole; latex; butalbital-aspirin-caffeine; sulfa antibiotics; femara [letrozole]; and sulfasalazine.  MEDICATIONS:  Current Outpatient Medications  Medication Sig Dispense Refill  . atorvastatin (LIPITOR) 10 MG tablet Take 10 mg by mouth  at bedtime.    . Calcium Carb-Cholecalciferol (CALCIUM 600+D) 600-800 MG-UNIT TABS Take 1 tablet by mouth daily.     . cephALEXin  (KEFLEX) 500 MG capsule Take 1 capsule (500 mg total) by mouth 2 (two) times daily. 14 capsule 0  . cyclobenzaprine (FLEXERIL) 10 MG tablet Take 10 mg by mouth 3 (three) times daily as needed for muscle spasms.     Marland Kitchen dicyclomine (BENTYL) 10 MG capsule Take 1 capsule (10 mg total) by mouth 3 (three) times daily as needed for spasms. Patient states that she started this 3 weeks ago (Patient taking differently: Take 10 mg by mouth daily. ) 270 capsule 1  . escitalopram (LEXAPRO) 5 MG tablet Take 5 mg by mouth daily.    . hydrochlorothiazide (HYDRODIURIL) 25 MG tablet Take 25 mg by mouth daily.    . hydroxypropyl methylcellulose / hypromellose (ISOPTO TEARS / GONIOVISC) 2.5 % ophthalmic solution Place 1 drop into both eyes 3 (three) times daily as needed for dry eyes.    Marland Kitchen lidocaine-prilocaine (EMLA) cream Apply to affected area once 30 g 3  . loperamide (IMODIUM) 2 MG capsule Take 2 mg by mouth daily before breakfast.    . LORazepam (ATIVAN) 0.5 MG tablet Take 1 tablet (0.5 mg total) by mouth at bedtime as needed for sleep. 30 tablet 0  . meloxicam (MOBIC) 7.5 MG tablet Take 7.5 mg by mouth 2 (two) times daily as needed for pain.     . Menthol, Topical Analgesic, (ICY HOT) 5 % PADS Apply 1 patch topically daily as needed (pain).     . ondansetron (ZOFRAN) 8 MG tablet Take 1 tablet (8 mg total) by mouth 2 (two) times daily as needed (Nausea or vomiting). 30 tablet 1  . oxyCODONE (OXY IR/ROXICODONE) 5 MG immediate release tablet Take 1 tablet (5 mg total) by mouth every 6 (six) hours as needed for severe pain. 10 tablet 0  . prochlorperazine (COMPAZINE) 10 MG tablet Take 1 tablet (10 mg total) by mouth every 6 (six) hours as needed (Nausea or vomiting). 30 tablet 1   No current facility-administered medications for this visit.     PHYSICAL EXAMINATION: ECOG PERFORMANCE STATUS: 1 - Symptomatic but completely ambulatory  Vitals:   12/28/17 1108  BP: (!) 165/68  Pulse: 70  Resp: 18  Temp: 97.8 F  (36.6 C)  SpO2: 98%   Filed Weights   12/28/17 1108  Weight: 215 lb (97.5 kg)    GENERAL:alert, no distress and comfortable SKIN: skin color, texture, turgor are normal, no rashes or significant lesions EYES: normal, Conjunctiva are pink and non-injected, sclera clear OROPHARYNX:no exudate, no erythema and lips, buccal mucosa, and tongue normal  NECK: supple, thyroid normal size, non-tender, without nodularity LYMPH:  no palpable lymphadenopathy in the cervical, axillary or inguinal LUNGS: clear to auscultation and percussion with normal breathing effort HEART: regular rate & rhythm and no murmurs and no lower extremity edema ABDOMEN:abdomen soft, non-tender and normal bowel sounds MUSCULOSKELETAL:no cyanosis of digits and no clubbing  NEURO: alert & oriented x 3 with fluent speech, no focal motor/sensory deficits EXTREMITIES: No lower extremity edema   LABORATORY DATA:  I have reviewed the data as listed CMP Latest Ref Rng & Units 12/21/2017 12/14/2017 12/07/2017  Glucose 70 - 99 mg/dL 108(H) 82 113(H)  BUN 8 - 23 mg/dL 19 21 20   Creatinine 0.44 - 1.00 mg/dL 0.77 0.83 0.77  Sodium 135 - 145 mmol/L 144 144 146(H)  Potassium 3.5 - 5.1 mmol/L  3.7 4.0 3.6  Chloride 98 - 111 mmol/L 108 109 111  CO2 22 - 32 mmol/L 28 28 28   Calcium 8.9 - 10.3 mg/dL 8.9 9.1 8.9  Total Protein 6.5 - 8.1 g/dL 5.9(L) 6.2(L) 6.2(L)  Total Bilirubin 0.3 - 1.2 mg/dL 0.4 0.3 0.4  Alkaline Phos 38 - 126 U/L 69 71 71  AST 15 - 41 U/L 13(L) 13(L) 14(L)  ALT 0 - 44 U/L 19 19 19     Lab Results  Component Value Date   WBC 4.4 12/21/2017   HGB 10.8 (L) 12/21/2017   HCT 32.8 (L) 12/21/2017   MCV 95.3 12/21/2017   PLT 194 12/21/2017   NEUTROABS 3.0 12/21/2017    ASSESSMENT & PLAN:  Malignant neoplasm of upper-outer quadrant of left breast in female, estrogen receptor positive ([facility]) 09/11/2017:Left lumpectomy: Grade 2 invasive lobular cancer, 1.1 cm, LCIS, margins negative, 0/2 lymph nodes negative, ER 90%  strong staining, PR negative, HER-2 positive ratio 2.62, Ki-67 1%, T1c N0 stage Ia  Pathology counseling: I discussed the final pathology report of the patient provided a copy of this report. I discussed the margins as well as lymph node surgeries. We also discussed the final staging along with previously performed ER/PR and HER-2/neu testing.  Recommendation: 1.Adjuvant therapy with Taxol Herceptin weekly x12 followed by Herceptin maintenance for 1 year 2.Adjuvant radiation 3.Followed by adjuvant antiestrogen therapy ------------------------------------------------------------------ Current treatment: Cycle9day 1 Taxol Herceptin Echocardiogram: 08/17/2017 EF 55 to 60%  Chemo toxicities: 1.Diarrhea: Could be related to her irritable bowel syndrome rather than her treatment. Instructed her on increasing her fluid intake. 2.Fatigue: Patient was previously fatigued even prior to starting chemotherapy. labs reviewed 3.Hypokalemia:  Related to diarrhea.  Much improved  Knife injury on the dorsum of her left index finger:  Return to clinicweekly for chemo and every 2 weeks for follow-up withme We will try to get her in to see radiation oncology in [place] after finishing chemotherapy in January.   No orders of the defined types were placed in this encounter.  The patient has a good understanding of the overall plan. she agrees with it. she will call with any problems that may develop before the next visit here.   Harriette Ohara, MD 12/28/17

## 2017-12-28 NOTE — Patient Instructions (Signed)

## 2017-12-29 ENCOUNTER — Telehealth: Payer: Self-pay | Admitting: Hematology and Oncology

## 2017-12-29 NOTE — Telephone Encounter (Signed)
No 12/28/17 los.  °

## 2018-01-04 ENCOUNTER — Inpatient Hospital Stay: Payer: Medicare Other

## 2018-01-04 VITALS — BP 154/64 | HR 65 | Temp 98.0°F | Resp 18

## 2018-01-04 DIAGNOSIS — C50412 Malignant neoplasm of upper-outer quadrant of left female breast: Secondary | ICD-10-CM | POA: Diagnosis not present

## 2018-01-04 DIAGNOSIS — Z170 Estrogen receptor positive status [ER+]: Principal | ICD-10-CM

## 2018-01-04 DIAGNOSIS — Z5111 Encounter for antineoplastic chemotherapy: Secondary | ICD-10-CM | POA: Diagnosis not present

## 2018-01-04 DIAGNOSIS — Z95828 Presence of other vascular implants and grafts: Secondary | ICD-10-CM

## 2018-01-04 DIAGNOSIS — Z5112 Encounter for antineoplastic immunotherapy: Secondary | ICD-10-CM | POA: Diagnosis not present

## 2018-01-04 LAB — CBC WITH DIFFERENTIAL (CANCER CENTER ONLY)
Abs Immature Granulocytes: 0.03 10*3/uL (ref 0.00–0.07)
Basophils Absolute: 0 10*3/uL (ref 0.0–0.1)
Basophils Relative: 1 %
Eosinophils Absolute: 0.1 10*3/uL (ref 0.0–0.5)
Eosinophils Relative: 2 %
HCT: 34.1 % — ABNORMAL LOW (ref 36.0–46.0)
Hemoglobin: 11.1 g/dL — ABNORMAL LOW (ref 12.0–15.0)
Immature Granulocytes: 1 %
Lymphocytes Relative: 24 %
Lymphs Abs: 0.9 10*3/uL (ref 0.7–4.0)
MCH: 31.4 pg (ref 26.0–34.0)
MCHC: 32.6 g/dL (ref 30.0–36.0)
MCV: 96.6 fL (ref 80.0–100.0)
Monocytes Absolute: 0.3 10*3/uL (ref 0.1–1.0)
Monocytes Relative: 8 %
Neutro Abs: 2.5 10*3/uL (ref 1.7–7.7)
Neutrophils Relative %: 64 %
Platelet Count: 176 10*3/uL (ref 150–400)
RBC: 3.53 MIL/uL — ABNORMAL LOW (ref 3.87–5.11)
RDW: 14.8 % (ref 11.5–15.5)
WBC Count: 3.8 10*3/uL — ABNORMAL LOW (ref 4.0–10.5)
nRBC: 0 % (ref 0.0–0.2)

## 2018-01-04 LAB — CMP (CANCER CENTER ONLY)
ALK PHOS: 68 U/L (ref 38–126)
ALT: 19 U/L (ref 0–44)
AST: 13 U/L — ABNORMAL LOW (ref 15–41)
Albumin: 3.3 g/dL — ABNORMAL LOW (ref 3.5–5.0)
Anion gap: 7 (ref 5–15)
BUN: 15 mg/dL (ref 8–23)
CALCIUM: 8.9 mg/dL (ref 8.9–10.3)
CO2: 29 mmol/L (ref 22–32)
Chloride: 109 mmol/L (ref 98–111)
Creatinine: 0.79 mg/dL (ref 0.44–1.00)
GFR, Est AFR Am: >60 mL/min (ref >60)
GFR, Estimated: >60 mL/min (ref >60)
Glucose, Bld: 121 mg/dL — ABNORMAL HIGH (ref 70–99)
Potassium: 3.4 mmol/L — ABNORMAL LOW (ref 3.5–5.1)
Sodium: 145 mmol/L (ref 135–145)
TOTAL PROTEIN: 6.1 g/dL — AB (ref 6.5–8.1)
Total Bilirubin: 0.4 mg/dL (ref 0.3–1.2)

## 2018-01-04 MED ORDER — DIPHENHYDRAMINE HCL 50 MG/ML IJ SOLN
INTRAMUSCULAR | Status: AC
  Filled 2018-01-04: qty 1

## 2018-01-04 MED ORDER — SODIUM CHLORIDE 0.9% FLUSH
10.0000 mL | Freq: Once | INTRAVENOUS | Status: AC
  Administered 2018-01-04: 10 mL
  Filled 2018-01-04: qty 10

## 2018-01-04 MED ORDER — ACETAMINOPHEN 325 MG PO TABS: 650.0000 mg | ORAL_TABLET | Freq: Once | ORAL | Status: DC

## 2018-01-04 MED ORDER — FAMOTIDINE IN NACL 20-0.9 MG/50ML-% IV SOLN
INTRAVENOUS | Status: AC
  Filled 2018-01-04: qty 50

## 2018-01-04 MED ORDER — SODIUM CHLORIDE 0.9 % IV SOLN
80.0000 mg/m2 | Freq: Once | INTRAVENOUS | Status: AC
  Administered 2018-01-04: 168 mg via INTRAVENOUS
  Filled 2018-01-04: qty 28

## 2018-01-04 MED ORDER — DIPHENHYDRAMINE HCL 50 MG/ML IJ SOLN
25.0000 mg | Freq: Once | INTRAMUSCULAR | Status: AC
  Administered 2018-01-04: 25 mg via INTRAVENOUS

## 2018-01-04 MED ORDER — ACETAMINOPHEN 325 MG PO TABS
650.0000 mg | ORAL_TABLET | Freq: Once | ORAL | Status: AC
  Administered 2018-01-04: 650 mg via ORAL

## 2018-01-04 MED ORDER — SODIUM CHLORIDE 0.9% FLUSH
10.0000 mL | INTRAVENOUS | Status: DC | PRN
  Administered 2018-01-04: 10 mL
  Filled 2018-01-04: qty 10

## 2018-01-04 MED ORDER — FAMOTIDINE IN NACL 20-0.9 MG/50ML-% IV SOLN
20.0000 mg | Freq: Once | INTRAVENOUS | Status: AC
  Administered 2018-01-04: 20 mg via INTRAVENOUS

## 2018-01-04 MED ORDER — DEXAMETHASONE SODIUM PHOSPHATE 10 MG/ML IJ SOLN
INTRAMUSCULAR | Status: AC
  Filled 2018-01-04: qty 1

## 2018-01-04 MED ORDER — HEPARIN SOD (PORK) LOCK FLUSH 100 UNIT/ML IV SOLN
500.0000 [IU] | Freq: Once | INTRAVENOUS | Status: AC | PRN
  Administered 2018-01-04: 500 [IU]
  Filled 2018-01-04: qty 5

## 2018-01-04 MED ORDER — DEXAMETHASONE SODIUM PHOSPHATE 10 MG/ML IJ SOLN
10.0000 mg | Freq: Once | INTRAMUSCULAR | Status: AC
  Administered 2018-01-04: 10 mg via INTRAVENOUS

## 2018-01-04 MED ORDER — SODIUM CHLORIDE 0.9 % IV SOLN
Freq: Once | INTRAVENOUS | Status: AC
  Administered 2018-01-04: 13:00:00 via INTRAVENOUS
  Filled 2018-01-04: qty 250

## 2018-01-04 MED ORDER — TRASTUZUMAB CHEMO 150 MG IV SOLR
2.0000 mg/kg | Freq: Once | INTRAVENOUS | Status: AC
  Administered 2018-01-04: 189 mg via INTRAVENOUS
  Filled 2018-01-04: qty 9

## 2018-01-04 MED ORDER — ACETAMINOPHEN 325 MG PO TABS
ORAL_TABLET | ORAL | Status: AC
  Filled 2018-01-04: qty 2

## 2018-01-04 NOTE — Patient Instructions (Signed)
[facility] Discharge Instructions for Patients Receiving Chemotherapy  Today you received the following chemotherapy agents:  Herceptin and Taxol.  To help prevent nausea and vomiting after your treatment, we encourage you to take your nausea medication as directed.   If you develop nausea and vomiting that is not controlled by your nausea medication, call the clinic.   BELOW ARE SYMPTOMS THAT SHOULD BE REPORTED IMMEDIATELY:  *FEVER GREATER THAN 100.5 F  *CHILLS WITH OR WITHOUT FEVER  NAUSEA AND VOMITING THAT IS NOT CONTROLLED WITH YOUR NAUSEA MEDICATION  *UNUSUAL SHORTNESS OF BREATH  *UNUSUAL BRUISING OR BLEEDING  TENDERNESS IN MOUTH AND THROAT WITH OR WITHOUT PRESENCE OF ULCERS  *URINARY PROBLEMS  *BOWEL PROBLEMS  UNUSUAL RASH Items with * indicate a potential emergency and should be followed up as soon as possible.  Feel free to call the clinic should you have any questions or concerns. The clinic phone number is [phone].  Please show the CHEMO ALERT CARD at check-in to the Emergency Department and triage nurse.   

## 2018-01-05 ENCOUNTER — Other Ambulatory Visit: Payer: Self-pay | Admitting: Oncology

## 2018-01-05 NOTE — Progress Notes (Unsigned)
I called Sylvia Barnett to gave her results of her ultrasound of the kidneys.  She understands there is a cyst in the left kidney and that that is of no consequence.  There is moderate hydronephrosis on the right side.  There is no obvious cause for this.  This can be addressed when she returns to see Dr. Payton Mccallum 01/11/2018.  Patient has a good understanding of the above.

## 2018-01-11 ENCOUNTER — Encounter: Payer: Self-pay | Admitting: *Deleted

## 2018-01-11 ENCOUNTER — Inpatient Hospital Stay: Payer: Medicare Other

## 2018-01-11 ENCOUNTER — Inpatient Hospital Stay (HOSPITAL_BASED_OUTPATIENT_CLINIC_OR_DEPARTMENT_OTHER): Payer: Medicare Other | Admitting: Hematology and Oncology

## 2018-01-11 ENCOUNTER — Other Ambulatory Visit (HOSPITAL_COMMUNITY): Payer: Self-pay | Admitting: Hematology and Oncology

## 2018-01-11 VITALS — BP 151/71 | HR 81 | Temp 98.4°F | Resp 18 | Ht 65.0 in | Wt 211.9 lb

## 2018-01-11 DIAGNOSIS — N1330 Unspecified hydronephrosis: Secondary | ICD-10-CM | POA: Diagnosis not present

## 2018-01-11 DIAGNOSIS — Z170 Estrogen receptor positive status [ER+]: Principal | ICD-10-CM

## 2018-01-11 DIAGNOSIS — C50412 Malignant neoplasm of upper-outer quadrant of left female breast: Secondary | ICD-10-CM | POA: Diagnosis not present

## 2018-01-11 DIAGNOSIS — Z5112 Encounter for antineoplastic immunotherapy: Secondary | ICD-10-CM | POA: Diagnosis not present

## 2018-01-11 DIAGNOSIS — R197 Diarrhea, unspecified: Secondary | ICD-10-CM

## 2018-01-11 DIAGNOSIS — E876 Hypokalemia: Secondary | ICD-10-CM

## 2018-01-11 DIAGNOSIS — G620 Drug-induced polyneuropathy: Secondary | ICD-10-CM | POA: Diagnosis not present

## 2018-01-11 DIAGNOSIS — Z5111 Encounter for antineoplastic chemotherapy: Secondary | ICD-10-CM | POA: Diagnosis not present

## 2018-01-11 DIAGNOSIS — K589 Irritable bowel syndrome without diarrhea: Secondary | ICD-10-CM | POA: Diagnosis not present

## 2018-01-11 DIAGNOSIS — Z95828 Presence of other vascular implants and grafts: Secondary | ICD-10-CM

## 2018-01-11 DIAGNOSIS — R5383 Other fatigue: Secondary | ICD-10-CM

## 2018-01-11 DIAGNOSIS — C50919 Malignant neoplasm of unspecified site of unspecified female breast: Secondary | ICD-10-CM

## 2018-01-11 LAB — CBC WITH DIFFERENTIAL (CANCER CENTER ONLY)
Abs Immature Granulocytes: 0.02 10*3/uL (ref 0.00–0.07)
BASOS PCT: 0 %
Basophils Absolute: 0 10*3/uL (ref 0.0–0.1)
Eosinophils Absolute: 0.1 10*3/uL (ref 0.0–0.5)
Eosinophils Relative: 2 %
HCT: 33.2 % — ABNORMAL LOW (ref 36.0–46.0)
Hemoglobin: 10.8 g/dL — ABNORMAL LOW (ref 12.0–15.0)
IMMATURE GRANULOCYTES: 0 %
Lymphocytes Relative: 16 %
Lymphs Abs: 0.8 10*3/uL (ref 0.7–4.0)
MCH: 31.7 pg (ref 26.0–34.0)
MCHC: 32.5 g/dL (ref 30.0–36.0)
MCV: 97.4 fL (ref 80.0–100.0)
Monocytes Absolute: 0.4 10*3/uL (ref 0.1–1.0)
Monocytes Relative: 10 %
NEUTROS PCT: 72 %
NRBC: 0 % (ref 0.0–0.2)
Neutro Abs: 3.3 10*3/uL (ref 1.7–7.7)
Platelet Count: 168 10*3/uL (ref 150–400)
RBC: 3.41 MIL/uL — ABNORMAL LOW (ref 3.87–5.11)
RDW: 15 % (ref 11.5–15.5)
WBC Count: 4.6 10*3/uL (ref 4.0–10.5)

## 2018-01-11 LAB — CMP (CANCER CENTER ONLY)
ALT: 20 U/L (ref 0–44)
AST: 12 U/L — ABNORMAL LOW (ref 15–41)
Albumin: 3.2 g/dL — ABNORMAL LOW (ref 3.5–5.0)
Alkaline Phosphatase: 63 U/L (ref 38–126)
Anion gap: 7 (ref 5–15)
BUN: 17 mg/dL (ref 8–23)
CO2: 29 mmol/L (ref 22–32)
Calcium: 9 mg/dL (ref 8.9–10.3)
Chloride: 108 mmol/L (ref 98–111)
Creatinine: 0.76 mg/dL (ref 0.44–1.00)
GFR, Estimated: >60 mL/min (ref >60)
Glucose, Bld: 93 mg/dL (ref 70–99)
Potassium: 3.8 mmol/L (ref 3.5–5.1)
Sodium: 144 mmol/L (ref 135–145)
Total Bilirubin: 0.4 mg/dL (ref 0.3–1.2)
Total Protein: 5.9 g/dL — ABNORMAL LOW (ref 6.5–8.1)

## 2018-01-11 MED ORDER — SODIUM CHLORIDE 0.9% FLUSH
10.0000 mL | Freq: Once | INTRAVENOUS | Status: AC
  Administered 2018-01-11: 10 mL
  Filled 2018-01-11: qty 10

## 2018-01-11 MED ORDER — SODIUM CHLORIDE 0.9% FLUSH
10.0000 mL | INTRAVENOUS | Status: DC | PRN
  Administered 2018-01-11: 10 mL
  Filled 2018-01-11: qty 10

## 2018-01-11 MED ORDER — FAMOTIDINE IN NACL 20-0.9 MG/50ML-% IV SOLN
20.0000 mg | Freq: Once | INTRAVENOUS | Status: AC
  Administered 2018-01-11: 20 mg via INTRAVENOUS

## 2018-01-11 MED ORDER — DEXAMETHASONE SODIUM PHOSPHATE 10 MG/ML IJ SOLN
INTRAMUSCULAR | Status: AC
  Filled 2018-01-11: qty 1

## 2018-01-11 MED ORDER — ACETAMINOPHEN 325 MG PO TABS
650.0000 mg | ORAL_TABLET | Freq: Once | ORAL | Status: AC
  Administered 2018-01-11: 650 mg via ORAL

## 2018-01-11 MED ORDER — SODIUM CHLORIDE 0.9 % IV SOLN
50.0000 mg/m2 | Freq: Once | INTRAVENOUS | Status: AC
  Administered 2018-01-11: 108 mg via INTRAVENOUS
  Filled 2018-01-11: qty 18

## 2018-01-11 MED ORDER — DIPHENHYDRAMINE HCL 50 MG/ML IJ SOLN
25.0000 mg | Freq: Once | INTRAMUSCULAR | Status: AC
  Administered 2018-01-11: 25 mg via INTRAVENOUS

## 2018-01-11 MED ORDER — TRASTUZUMAB CHEMO 150 MG IV SOLR
2.0000 mg/kg | Freq: Once | INTRAVENOUS | Status: AC
  Administered 2018-01-11: 189 mg via INTRAVENOUS
  Filled 2018-01-11: qty 9

## 2018-01-11 MED ORDER — ACETAMINOPHEN 325 MG PO TABS
ORAL_TABLET | ORAL | Status: AC
  Filled 2018-01-11: qty 2

## 2018-01-11 MED ORDER — SODIUM CHLORIDE 0.9 % IV SOLN
Freq: Once | INTRAVENOUS | Status: AC
  Administered 2018-01-11: 12:00:00 via INTRAVENOUS
  Filled 2018-01-11: qty 250

## 2018-01-11 MED ORDER — FAMOTIDINE IN NACL 20-0.9 MG/50ML-% IV SOLN
INTRAVENOUS | Status: AC
  Filled 2018-01-11: qty 50

## 2018-01-11 MED ORDER — DEXAMETHASONE SODIUM PHOSPHATE 10 MG/ML IJ SOLN
10.0000 mg | Freq: Once | INTRAMUSCULAR | Status: AC
  Administered 2018-01-11: 10 mg via INTRAVENOUS

## 2018-01-11 MED ORDER — DIPHENHYDRAMINE HCL 50 MG/ML IJ SOLN
INTRAMUSCULAR | Status: AC
  Filled 2018-01-11: qty 1

## 2018-01-11 MED ORDER — HEPARIN SOD (PORK) LOCK FLUSH 100 UNIT/ML IV SOLN
500.0000 [IU] | Freq: Once | INTRAVENOUS | Status: AC | PRN
  Administered 2018-01-11: 500 [IU]
  Filled 2018-01-11: qty 5

## 2018-01-11 NOTE — Research (Signed)
UPBEAT Reconsent  01/11/18 13:05 Patient Sylvia Barnett was present today for a routine visit, unaccompanied when meeting with this RN.  This RN met with the patient and explained to her that a new update to the UPBEAT study had been released and so had a new consent form.  This RN explained that in addition to the prior procedures in the study, a waist measurement had been added as a part of the study visits.  This RN also explained that an option to receive motivational text messages from the study team at [facility] was added, if she was interested.  This RN provided the patient with a copy of the updated consent form.  No change in risks or benefits of participation.  This RN allowed the patient with an opportunity to review the consent form in detail.  Upon return by this RN, the patient verbalized an understanding of the update and had read the consent form. The patient denies any questions. The patient voluntarily signed the amended consent form.  The patient consented to the collection of optional DNA specimen, but decline participation in the text messages and declined that the study team be able to contact her for future studies.  This RN provided a copy of the signed consent form to the patient.  The patient was thanked for her ongoing participation in the UPBEAT study.  Visit Scheduling - The patient is now in window for her Month 3 visit, and is nearing the end of her weekly appointments at [facility].  This RN and the patient discussed options for completing the month 3 visit, and the patient would like to complete all activities on the same date as her next treatment.  This RN has scheduled appointments accordingly.  The patient was reminded about the 3 hour fasting requirement prior to her blood draw at her upcoming appointment and the patient verbalized an understanding.  The patient denies any questions at this time.  Doreatha Martin, RN, BSN, [facility] 01/11/2018 9:19 PM

## 2018-01-11 NOTE — Progress Notes (Signed)
Patient Care Team: Manon Hilding, MD as PCP - General (Unknown Physician Specialty) Excell Seltzer, MD as Consulting Physician (General Surgery) Nicholas Lose, MD as Consulting Physician (Hematology and Oncology) Gery Pray, MD as Consulting Physician (Radiation Oncology)  DIAGNOSIS:  Encounter Diagnoses  Name Primary?  . Malignant neoplasm of upper-outer quadrant of left breast in female, estrogen receptor positive ([place])   . Hydronephrosis of right kidney Yes    SUMMARY OF ONCOLOGIC HISTORY:   Malignant neoplasm of upper-outer quadrant of left breast in female, estrogen receptor positive ([place])   08/05/2017 Initial Diagnosis    Screening detected left breast calcifications UOQ 1.1 cm biopsy revealed invasive lobular cancer with LCIS and CSL, grade 2, ER 90%, PR 0%, Ki-67 1%, HER-2 positive ratio 2.62, T2N0 stage Ia clinical stage AJCC 8    08/12/2017 Cancer Staging    Staging form: Breast, AJCC 8th Edition - Clinical stage from 08/12/2017: Stage IA (cT1c, cN0, cM0, G2, ER+, PR-, HER2+) - Signed by Nicholas Lose, MD on 08/12/2017    09/11/2017 Surgery    Left lumpectomy: Grade 2 invasive lobular cancer, 1.1 cm, LCIS, margins negative, 0/2 lymph nodes negative, ER 90% strong staining, PR negative, HER-2 positive ratio 2.62, Ki-67 1%, T1c N0 stage Ia    09/23/2017 Cancer Staging    Staging form: Breast, AJCC 8th Edition - Pathologic: Stage IA (pT1c, pN0, cM0, G2, ER+, PR-, HER2+) - Signed by Gardenia Phlegm, NP on 09/23/2017    11/02/2017 -  Chemotherapy    The patient had trastuzumab (HERCEPTIN) 399 mg in sodium chloride 0.9 % 250 mL chemo infusion, 4 mg/kg = 588 mg, Intravenous,  Once, 3 of 3 cycles Administration: 399 mg (11/02/2017), 189 mg (11/16/2017), 189 mg (11/09/2017), 189 mg (11/30/2017), 189 mg (11/23/2017), 189 mg (12/07/2017), 189 mg (12/14/2017), 189 mg (12/21/2017), 189 mg (12/28/2017), 189 mg (01/04/2018) PACLitaxel (TAXOL) 168 mg in sodium chloride 0.9 % 250  mL chemo infusion (</= 49m/m2), 80 mg/m2 = 168 mg, Intravenous,  Once, 3 of 3 cycles Dose modification: 50 mg/m2 (original dose 80 mg/m2, Cycle 3, Reason: Dose not tolerated) Administration: 168 mg (11/02/2017), 168 mg (11/09/2017), 168 mg (11/16/2017), 168 mg (11/30/2017), 168 mg (12/07/2017), 168 mg (12/14/2017), 168 mg (12/28/2017), 168 mg (01/04/2018), 168 mg (11/23/2017), 168 mg (12/21/2017)  for chemotherapy treatment.      CHIEF COMPLIANT: Cycle 11 Taxol  INTERVAL HISTORY: PTANNIE KOSKELAis a 89-yearwith above-mentioned history of left breast cancer treated with lumpectomy and is now on adjuvant chemotherapy today cycle 11 of Taxol.  She has noticed mild numbness and tingling in the tips of the fingers and toes.  This appears intermittently.  Her hands also fall asleep intermittently but she has had this problem even prior to starting chemo.  Does not have any nausea or vomiting.  Denies any constipation diarrhea.  REVIEW OF SYSTEMS:   Constitutional: Denies fevers, chills or abnormal weight loss Eyes: Denies blurriness of vision Ears, nose, mouth, throat, and face: Denies mucositis or sore throat Respiratory: Denies cough, dyspnea or wheezes Cardiovascular: Denies palpitation, chest discomfort Gastrointestinal:  Denies nausea, heartburn or change in bowel habits Skin: Denies abnormal skin rashes Lymphatics: Denies new lymphadenopathy or easy bruising Neurological: Mild peripheral neuropathy Behavioral/Psych: Mood is stable, no new changes  Extremities: No lower extremity edema  All other systems were reviewed with the patient and are negative.  I have reviewed the past medical history, past surgical history, social history and family history with the patient and  they are unchanged from previous note.  ALLERGIES:  is allergic to fiorinal-codeine #3 [butalbital-asa-caff-codeine]; lansoprazole; latex; butalbital-aspirin-caffeine; sulfa antibiotics; femara [letrozole]; and  sulfasalazine.  MEDICATIONS:  Current Outpatient Medications  Medication Sig Dispense Refill  . atorvastatin (LIPITOR) 10 MG tablet Take 10 mg by mouth at bedtime.    . Calcium Carb-Cholecalciferol (CALCIUM 600+D) 600-800 MG-UNIT TABS Take 1 tablet by mouth daily.     . cephALEXin (KEFLEX) 500 MG capsule Take 1 capsule (500 mg total) by mouth 2 (two) times daily. 14 capsule 0  . cyclobenzaprine (FLEXERIL) 10 MG tablet Take 10 mg by mouth 3 (three) times daily as needed for muscle spasms.     Marland Kitchen dicyclomine (BENTYL) 10 MG capsule Take 1 capsule (10 mg total) by mouth 3 (three) times daily as needed for spasms. Patient states that she started this 3 weeks ago (Patient taking differently: Take 10 mg by mouth daily. ) 270 capsule 1  . escitalopram (LEXAPRO) 5 MG tablet Take 5 mg by mouth daily.    . hydrochlorothiazide (HYDRODIURIL) 25 MG tablet Take 25 mg by mouth daily.    . hydroxypropyl methylcellulose / hypromellose (ISOPTO TEARS / GONIOVISC) 2.5 % ophthalmic solution Place 1 drop into both eyes 3 (three) times daily as needed for dry eyes.    Marland Kitchen lidocaine-prilocaine (EMLA) cream Apply to affected area once 30 g 3  . loperamide (IMODIUM) 2 MG capsule Take 2 mg by mouth daily before breakfast.    . LORazepam (ATIVAN) 0.5 MG tablet Take 1 tablet (0.5 mg total) by mouth at bedtime as needed for sleep. 30 tablet 0  . meloxicam (MOBIC) 7.5 MG tablet Take 7.5 mg by mouth 2 (two) times daily as needed for pain.     . Menthol, Topical Analgesic, (ICY HOT) 5 % PADS Apply 1 patch topically daily as needed (pain).     . ondansetron (ZOFRAN) 8 MG tablet Take 1 tablet (8 mg total) by mouth 2 (two) times daily as needed (Nausea or vomiting). 30 tablet 1  . oxyCODONE (OXY IR/ROXICODONE) 5 MG immediate release tablet Take 1 tablet (5 mg total) by mouth every 6 (six) hours as needed for severe pain. 10 tablet 0  . prochlorperazine (COMPAZINE) 10 MG tablet Take 1 tablet (10 mg total) by mouth every 6 (six) hours as  needed (Nausea or vomiting). 30 tablet 1   No current facility-administered medications for this visit.    Facility-Administered Medications Ordered in Other Visits  Medication Dose Route Frequency Provider Last Rate Last Dose  . heparin lock flush 100 unit/mL  500 Units Intracatheter Once PRN Nicholas Lose, MD      . PACLitaxel (TAXOL) 108 mg in sodium chloride 0.9 % 250 mL chemo infusion (</= 44m/m2)  50 mg/m2 (Treatment Plan Recorded) Intravenous Once GNicholas Lose MD 268 mL/hr at 01/11/18 1327 108 mg at 01/11/18 1327  . sodium chloride flush (NS) 0.9 % injection 10 mL  10 mL Intracatheter PRN GNicholas Lose MD        PHYSICAL EXAMINATION: ECOG PERFORMANCE STATUS: 1 - Symptomatic but completely ambulatory  Vitals:   01/11/18 1124  BP: (!) 151/71  Pulse: 81  Resp: 18  Temp: 98.4 F (36.9 C)  SpO2: 96%   Filed Weights   01/11/18 1124  Weight: 211 lb 14.4 oz (96.1 kg)    GENERAL:alert, no distress and comfortable SKIN: skin color, texture, turgor are normal, no rashes or significant lesions EYES: normal, Conjunctiva are pink and non-injected, sclera clear OROPHARYNX:no exudate,  no erythema and lips, buccal mucosa, and tongue normal  NECK: supple, thyroid normal size, non-tender, without nodularity LYMPH:  no palpable lymphadenopathy in the cervical, axillary or inguinal LUNGS: clear to auscultation and percussion with normal breathing effort HEART: regular rate & rhythm and no murmurs and no lower extremity edema ABDOMEN:abdomen soft, non-tender and normal bowel sounds MUSCULOSKELETAL:no cyanosis of digits and no clubbing  NEURO: alert & oriented x 3 with fluent speech, grade 1 peripheral neuropathy EXTREMITIES: No lower extremity edema    LABORATORY DATA:  I have reviewed the data as listed CMP Latest Ref Rng & Units 01/11/2018 01/04/2018 12/28/2017  Glucose 70 - 99 mg/dL 93 121(H) 112(H)  BUN 8 - 23 mg/dL _0 Creatinine 0.44 - 1.00 mg/dL 0.76 0.79 0.72    Sodium 135 - 145 mmol/L 144 145 145  Potassium 3.5 - 5.1 mmol/L 3.8 3.4(L) 3.2(L)  Chloride 98 - 111 mmol/L 108 109 109  CO2 22 - 32 mmol/L _1 Calcium 8.9 - 10.3 mg/dL 9.0 8.9 8.7(L)  Total Protein 6.5 - 8.1 g/dL 5.9(L) 6.1(L) 5.9(L)  Total Bilirubin 0.3 - 1.2 mg/dL 0.4 0.4 0.6  Alkaline Phos 38 - 126 U/L 63 68 67  AST 15 - 41 U/L 12(L) 13(L) 13(L)  ALT 0 - 44 U/L _2 Lab Results  Component Value Date   WBC 4.6 01/11/2018   HGB 10.8 (L) 01/11/2018   HCT 33.2 (L) 01/11/2018   MCV 97.4 01/11/2018   PLT 168 01/11/2018   NEUTROABS 3.3 01/11/2018    ASSESSMENT & PLAN:  Malignant neoplasm of upper-outer quadrant of left breast in female, estrogen receptor positive ([facility]) 09/11/2017:Left lumpectomy: Grade 2 invasive lobular cancer, 1.1 cm, LCIS, margins negative, 0/2 lymph nodes negative, ER 90% strong staining, PR negative, HER-2 positive ratio 2.62, Ki-67 1%, T1c N0 stage Ia  Pathology counseling: I discussed the final pathology report of the patient provided a copy of this report. I discussed the margins as well as lymph node surgeries. We also discussed the final staging along with previously performed ER/PR and HER-2/neu testing.  Recommendation: 1.Adjuvant therapy with Taxol Herceptin weekly x12 followed by Herceptin maintenance for 1 year 2.Adjuvant radiation 3.Followed by adjuvant antiestrogen therapy ------------------------------------------------------------------ Current treatment: Cycle11day 1 Taxol Herceptin Echocardiogram: 08/17/2017 EF 55 to 60%  Chemo toxicities: 1.Diarrhea: Could be related to her irritable bowel syndrome rather than her treatment. Instructed her on increasing her fluid intake. 2.Fatigue: Patient was previously fatigued even prior to starting chemotherapy. labs reviewed 3.Hypokalemia:  Related to diarrhea.  Much improved 4.  Chemo-induced peripheral neuropathy: We will decrease the dosage of Taxol for the last 2  cycles  Return to clinicnext week for her last chemo.  After that she will get Herceptin every 3 weeks She will be seeing radiation oncology in [place] after finishing chemotherapy in January.  Return to clinic  for Herceptin maintenance treatments.    Orders Placed This Encounter  Procedures  . Ambulatory referral to Urology    Referral Priority:   Routine    Referral Type:   Consultation    Referral Reason:   Specialty Services Required    Referred to Provider:   Festus Aloe, MD    Requested Specialty:   Urology    Number of Visits Requested:   1   The patient has a good understanding of the overall plan. she agrees with it. she will call with any problems that may develop before the  next visit here.   Harriette Ohara, MD 01/11/18

## 2018-01-11 NOTE — Assessment & Plan Note (Signed)
09/11/2017:Left lumpectomy: Grade 2 invasive lobular cancer, 1.1 cm, LCIS, margins negative, 0/2 lymph nodes negative, ER 90% strong staining, PR negative, HER-2 positive ratio 2.62, Ki-67 1%, T1c N0 stage Ia  Pathology counseling: I discussed the final pathology report of the patient provided a copy of this report. I discussed the margins as well as lymph node surgeries. We also discussed the final staging along with previously performed ER/PR and HER-2/neu testing.  Recommendation: 1.Adjuvant therapy with Taxol Herceptin weekly x12 followed by Herceptin maintenance for 1 year 2.Adjuvant radiation 3.Followed by adjuvant antiestrogen therapy ------------------------------------------------------------------ Current treatment: Cycle11day 1 Taxol Herceptin Echocardiogram: 08/17/2017 EF 55 to 60%  Chemo toxicities: 1.Diarrhea: Could be related to her irritable bowel syndrome rather than her treatment. Instructed her on increasing her fluid intake. 2.Fatigue: Patient was previously fatigued even prior to starting chemotherapy. labs reviewed 3.Hypokalemia:  Related to diarrhea.  Much improved  Knife injury on the dorsum of her left index finger: Healed  Return to clinicnext week for her last chemo.  After that she will get Herceptin every 3 weeks We will try to get her in to see radiation oncology in [place] after finishing chemotherapy in January.  Return to clinic with 1 of her Herceptin maintenance treatments.

## 2018-01-11 NOTE — Patient Instructions (Signed)
[facility] Discharge Instructions for Patients Receiving Chemotherapy  Today you received the following chemotherapy agents:  Herceptin and Taxol.  To help prevent nausea and vomiting after your treatment, we encourage you to take your nausea medication as directed.   If you develop nausea and vomiting that is not controlled by your nausea medication, call the clinic.   BELOW ARE SYMPTOMS THAT SHOULD BE REPORTED IMMEDIATELY:  *FEVER GREATER THAN 100.5 F  *CHILLS WITH OR WITHOUT FEVER  NAUSEA AND VOMITING THAT IS NOT CONTROLLED WITH YOUR NAUSEA MEDICATION  *UNUSUAL SHORTNESS OF BREATH  *UNUSUAL BRUISING OR BLEEDING  TENDERNESS IN MOUTH AND THROAT WITH OR WITHOUT PRESENCE OF ULCERS  *URINARY PROBLEMS  *BOWEL PROBLEMS  UNUSUAL RASH Items with * indicate a potential emergency and should be followed up as soon as possible.  Feel free to call the clinic should you have any questions or concerns. The clinic phone number is [phone].  Please show the CHEMO ALERT CARD at check-in to the Emergency Department and triage nurse.   

## 2018-01-18 ENCOUNTER — Ambulatory Visit (HOSPITAL_COMMUNITY): Admission: RE | Admit: 2018-01-18 | Payer: Medicare Other | Source: Ambulatory Visit

## 2018-01-18 ENCOUNTER — Inpatient Hospital Stay: Payer: Medicare Other

## 2018-01-18 ENCOUNTER — Inpatient Hospital Stay: Payer: Medicare Other | Attending: Hematology and Oncology

## 2018-01-18 ENCOUNTER — Inpatient Hospital Stay: Payer: Medicare Other | Admitting: *Deleted

## 2018-01-18 ENCOUNTER — Other Ambulatory Visit: Payer: Self-pay | Admitting: Hematology and Oncology

## 2018-01-18 VITALS — BP 167/75 | HR 69 | Temp 97.5°F | Resp 18

## 2018-01-18 VITALS — Wt 211.0 lb

## 2018-01-18 DIAGNOSIS — C50412 Malignant neoplasm of upper-outer quadrant of left female breast: Secondary | ICD-10-CM

## 2018-01-18 DIAGNOSIS — Z5112 Encounter for antineoplastic immunotherapy: Secondary | ICD-10-CM | POA: Diagnosis not present

## 2018-01-18 DIAGNOSIS — Z170 Estrogen receptor positive status [ER+]: Secondary | ICD-10-CM

## 2018-01-18 DIAGNOSIS — Z95828 Presence of other vascular implants and grafts: Secondary | ICD-10-CM

## 2018-01-18 LAB — CBC WITH DIFFERENTIAL (CANCER CENTER ONLY)
Abs Immature Granulocytes: 0.03 10*3/uL (ref 0.00–0.07)
BASOS ABS: 0 10*3/uL (ref 0.0–0.1)
Basophils Relative: 0 %
Eosinophils Absolute: 0.1 10*3/uL (ref 0.0–0.5)
Eosinophils Relative: 2 %
HCT: 34.6 % — ABNORMAL LOW (ref 36.0–46.0)
Hemoglobin: 11.3 g/dL — ABNORMAL LOW (ref 12.0–15.0)
Immature Granulocytes: 1 %
LYMPHS PCT: 15 %
Lymphs Abs: 0.8 10*3/uL (ref 0.7–4.0)
MCH: 31.9 pg (ref 26.0–34.0)
MCHC: 32.7 g/dL (ref 30.0–36.0)
MCV: 97.7 fL (ref 80.0–100.0)
Monocytes Absolute: 0.5 10*3/uL (ref 0.1–1.0)
Monocytes Relative: 9 %
NRBC: 0 % (ref 0.0–0.2)
Neutro Abs: 3.7 10*3/uL (ref 1.7–7.7)
Neutrophils Relative %: 73 %
Platelet Count: 191 10*3/uL (ref 150–400)
RBC: 3.54 MIL/uL — ABNORMAL LOW (ref 3.87–5.11)
RDW: 14.7 % (ref 11.5–15.5)
WBC Count: 5.1 10*3/uL (ref 4.0–10.5)

## 2018-01-18 LAB — RESEARCH LABS

## 2018-01-18 LAB — CMP (CANCER CENTER ONLY)
ALBUMIN: 3.4 g/dL — AB (ref 3.5–5.0)
ALT: 18 U/L (ref 0–44)
AST: 12 U/L — ABNORMAL LOW (ref 15–41)
Alkaline Phosphatase: 75 U/L (ref 38–126)
Anion gap: 8 (ref 5–15)
BUN: 14 mg/dL (ref 8–23)
CO2: 27 mmol/L (ref 22–32)
Calcium: 9 mg/dL (ref 8.9–10.3)
Chloride: 109 mmol/L (ref 98–111)
Creatinine: 0.91 mg/dL (ref 0.44–1.00)
GFR, Est AFR Am: >60 mL/min (ref >60)
GFR, Estimated: >60 mL/min (ref >60)
Glucose, Bld: 133 mg/dL — ABNORMAL HIGH (ref 70–99)
Potassium: 3.7 mmol/L (ref 3.5–5.1)
SODIUM: 144 mmol/L (ref 135–145)
Total Bilirubin: 0.5 mg/dL (ref 0.3–1.2)
Total Protein: 6.1 g/dL — ABNORMAL LOW (ref 6.5–8.1)

## 2018-01-18 MED ORDER — ACETAMINOPHEN 325 MG PO TABS
ORAL_TABLET | ORAL | Status: AC
  Filled 2018-01-18: qty 2

## 2018-01-18 MED ORDER — SODIUM CHLORIDE 0.9% FLUSH
10.0000 mL | INTRAVENOUS | Status: DC | PRN
  Administered 2018-01-18 (×2): 10 mL
  Filled 2018-01-18: qty 10

## 2018-01-18 MED ORDER — DIPHENHYDRAMINE HCL 25 MG PO CAPS
ORAL_CAPSULE | ORAL | Status: AC
  Filled 2018-01-18: qty 2

## 2018-01-18 MED ORDER — ACETAMINOPHEN 325 MG PO TABS
650.0000 mg | ORAL_TABLET | Freq: Once | ORAL | Status: AC
  Administered 2018-01-18: 650 mg via ORAL

## 2018-01-18 MED ORDER — DIPHENHYDRAMINE HCL 25 MG PO CAPS
50.0000 mg | ORAL_CAPSULE | Freq: Once | ORAL | Status: AC
  Administered 2018-01-18: 50 mg via ORAL

## 2018-01-18 MED ORDER — SODIUM CHLORIDE 0.9% FLUSH
10.0000 mL | Freq: Once | INTRAVENOUS | Status: AC
  Administered 2018-01-18: 10 mL
  Filled 2018-01-18: qty 10

## 2018-01-18 MED ORDER — TRASTUZUMAB CHEMO 150 MG IV SOLR
6.0000 mg/kg | Freq: Once | INTRAVENOUS | Status: AC
  Administered 2018-01-18: 567 mg via INTRAVENOUS
  Filled 2018-01-18: qty 27

## 2018-01-18 MED ORDER — SODIUM CHLORIDE 0.9 % IV SOLN
Freq: Once | INTRAVENOUS | Status: AC
  Administered 2018-01-18: 11:00:00 via INTRAVENOUS
  Filled 2018-01-18: qty 250

## 2018-01-18 MED ORDER — HEPARIN SOD (PORK) LOCK FLUSH 100 UNIT/ML IV SOLN
500.0000 [IU] | Freq: Once | INTRAVENOUS | Status: AC | PRN
  Administered 2018-01-18: 500 [IU]
  Filled 2018-01-18: qty 5

## 2018-01-18 NOTE — Progress Notes (Signed)
Pt with complaints that her peripheral neuropathy has become worse in her hands an feet.  Stated that when she stood up to come back to treatment area, both her hands and her feet became numb.  Denies any issues in picking up objects or walking, but continually states "it's a lot worse than last week". Informed Dr. Lindi Adie of neuropathy. See treatment plan for changes.  Will continue to follow.

## 2018-01-18 NOTE — Patient Instructions (Signed)
[facility] Discharge Instructions for Patients Receiving Chemotherapy  Today you received the following chemotherapy agents Herceptin  To help prevent nausea and vomiting after your treatment, we encourage you to take your nausea medication as directed   If you develop nausea and vomiting that is not controlled by your nausea medication, call the clinic.   BELOW ARE SYMPTOMS THAT SHOULD BE REPORTED IMMEDIATELY:  *FEVER GREATER THAN 100.5 F  *CHILLS WITH OR WITHOUT FEVER  NAUSEA AND VOMITING THAT IS NOT CONTROLLED WITH YOUR NAUSEA MEDICATION  *UNUSUAL SHORTNESS OF BREATH  *UNUSUAL BRUISING OR BLEEDING  TENDERNESS IN MOUTH AND THROAT WITH OR WITHOUT PRESENCE OF ULCERS  *URINARY PROBLEMS  *BOWEL PROBLEMS  UNUSUAL RASH Items with * indicate a potential emergency and should be followed up as soon as possible.  Feel free to call the clinic should you have any questions or concerns. The clinic phone number is [phone].  Please show the CHEMO ALERT CARD at check-in to the Emergency Department and triage nurse.   

## 2018-01-18 NOTE — Research (Signed)
[id number] Month 3   01/18/18 Patient Sylvia Barnett arrived today for her routine visit, and for her Month 3 visit procedures on the UPBEAT study.  She is accompanied by her spouse, though he did not stay with the patient during her physical functions testing or neurocognitive tests.   Labs - The patient had forgotten to fast prior to her blood draw, so the patient's study required labs were drawn during the patient's infusion appointment after three hours of fasting had elapsed.  Vitals - This RN reviewed vital sign measurement with infusion RN prior to patient's treatment.  Christina measured the vital signs after five minutes of rest in a seated position and heart rate and blood pressure were repeated after one minute.  See Infusion Encounter from 01/18/18 for documentation of measured vital signs.  Weight was measured, and is recorded in this encounter.    Questionnaires - Patient was provided with study required questionnaires.  This RN reviewed for completeness.  Patient scored a 2 on the depression questionnaire, which is not indicative of depressive symptoms, so follow up is not required at this time.  Cardiac MRI - cMRI was scheduled for today, unfortunately, the MRI department was not able to complete the MRI as planned.    Physical Function Testing - The physical functions testing was completed by this RN during this visit.  The patient's waist was also measured, and measured at 46."  Neurocognitive Testing - Neurocognitive tests were performed today by Remer Macho, Research Specialist.  Concomitant Medications - The patient reviewed her medication list and confirmed that it is correct.  The patient was thanked for her ongoing participation in the UPBEAT study.  This RN will attempt to reschedule the cMRI at a time that is convenient for the patient prior to the end of the visit window.  Doreatha Martin, RN, BSN, [facility] 01/18/2018 4:11 PM

## 2018-01-22 ENCOUNTER — Telehealth: Payer: Self-pay | Admitting: *Deleted

## 2018-01-22 NOTE — Telephone Encounter (Addendum)
[id number] Month 3 MRI  01/22/18 2:47 PM Left patient voicemail, requested she call back to discuss appointment scheduling.  Contact information given. Doreatha Martin, RN, BSN, [facility] 01/22/2018 2:47 PM  01/22/18 4:26 PM Spoke with patient and confirmed visits on 02/08/18.  Patient is willing to complete cardiac MRI for study on 02/08/18.  9:00am appointment for cMRI confirmed with patient.  Doreatha Martin, RN, BSN, [facility] 01/22/2018 4:26 PM

## 2018-02-04 DIAGNOSIS — C50412 Malignant neoplasm of upper-outer quadrant of left female breast: Secondary | ICD-10-CM | POA: Diagnosis not present

## 2018-02-04 DIAGNOSIS — Z170 Estrogen receptor positive status [ER+]: Secondary | ICD-10-CM | POA: Diagnosis not present

## 2018-02-05 ENCOUNTER — Other Ambulatory Visit: Payer: Self-pay

## 2018-02-05 DIAGNOSIS — C50412 Malignant neoplasm of upper-outer quadrant of left female breast: Secondary | ICD-10-CM

## 2018-02-05 DIAGNOSIS — Z170 Estrogen receptor positive status [ER+]: Principal | ICD-10-CM

## 2018-02-08 ENCOUNTER — Inpatient Hospital Stay: Payer: Medicare Other

## 2018-02-08 ENCOUNTER — Telehealth: Payer: Self-pay

## 2018-02-08 ENCOUNTER — Inpatient Hospital Stay (HOSPITAL_BASED_OUTPATIENT_CLINIC_OR_DEPARTMENT_OTHER): Payer: Medicare Other | Admitting: Hematology and Oncology

## 2018-02-08 ENCOUNTER — Ambulatory Visit (HOSPITAL_COMMUNITY)
Admission: RE | Admit: 2018-02-08 | Discharge: 2018-02-08 | Disposition: A | Discharge status: Home / Self Care | Payer: Medicare Other | Source: Ambulatory Visit | Attending: Hematology and Oncology | Admitting: Hematology and Oncology

## 2018-02-08 DIAGNOSIS — C50412 Malignant neoplasm of upper-outer quadrant of left female breast: Secondary | ICD-10-CM | POA: Diagnosis not present

## 2018-02-08 DIAGNOSIS — G620 Drug-induced polyneuropathy: Secondary | ICD-10-CM

## 2018-02-08 DIAGNOSIS — Z170 Estrogen receptor positive status [ER+]: Secondary | ICD-10-CM

## 2018-02-08 DIAGNOSIS — C50919 Malignant neoplasm of unspecified site of unspecified female breast: Secondary | ICD-10-CM | POA: Diagnosis not present

## 2018-02-08 DIAGNOSIS — Z95828 Presence of other vascular implants and grafts: Secondary | ICD-10-CM

## 2018-02-08 DIAGNOSIS — T451X5A Adverse effect of antineoplastic and immunosuppressive drugs, initial encounter: Secondary | ICD-10-CM | POA: Diagnosis not present

## 2018-02-08 DIAGNOSIS — Z5112 Encounter for antineoplastic immunotherapy: Secondary | ICD-10-CM | POA: Diagnosis not present

## 2018-02-08 LAB — CMP (CANCER CENTER ONLY)
ALT: 18 U/L (ref 0–44)
AST: 15 U/L (ref 15–41)
Albumin: 3.7 g/dL (ref 3.5–5.0)
Alkaline Phosphatase: 83 U/L (ref 38–126)
Anion gap: 8 (ref 5–15)
BUN: 14 mg/dL (ref 8–23)
CO2: 29 mmol/L (ref 22–32)
Calcium: 9.2 mg/dL (ref 8.9–10.3)
Chloride: 108 mmol/L (ref 98–111)
Creatinine: 0.73 mg/dL (ref 0.44–1.00)
Glucose, Bld: 92 mg/dL (ref 70–99)
POTASSIUM: 3.9 mmol/L (ref 3.5–5.1)
Sodium: 145 mmol/L (ref 135–145)
TOTAL PROTEIN: 6.6 g/dL (ref 6.5–8.1)
Total Bilirubin: 0.5 mg/dL (ref 0.3–1.2)

## 2018-02-08 LAB — CBC WITH DIFFERENTIAL (CANCER CENTER ONLY)
Abs Immature Granulocytes: 0 10*3/uL (ref 0.00–0.07)
Basophils Absolute: 0 10*3/uL (ref 0.0–0.1)
Basophils Relative: 1 %
Eosinophils Absolute: 0.2 10*3/uL (ref 0.0–0.5)
Eosinophils Relative: 4 %
HCT: 36.8 % (ref 36.0–46.0)
Hemoglobin: 11.7 g/dL — ABNORMAL LOW (ref 12.0–15.0)
Immature Granulocytes: 0 %
Lymphocytes Relative: 16 %
Lymphs Abs: 1 10*3/uL (ref 0.7–4.0)
MCH: 31.4 pg (ref 26.0–34.0)
MCHC: 31.8 g/dL (ref 30.0–36.0)
MCV: 98.7 fL (ref 80.0–100.0)
Monocytes Absolute: 0.6 10*3/uL (ref 0.1–1.0)
Monocytes Relative: 10 %
Neutro Abs: 4 10*3/uL (ref 1.7–7.7)
Neutrophils Relative %: 69 %
PLATELETS: 172 10*3/uL (ref 150–400)
RBC: 3.73 MIL/uL — ABNORMAL LOW (ref 3.87–5.11)
RDW: 13.5 % (ref 11.5–15.5)
WBC Count: 5.8 10*3/uL (ref 4.0–10.5)
nRBC: 0 % (ref 0.0–0.2)

## 2018-02-08 MED ORDER — SODIUM CHLORIDE 0.9% FLUSH
10.0000 mL | Freq: Once | INTRAVENOUS | Status: AC
  Administered 2018-02-08: 10 mL
  Filled 2018-02-08: qty 10

## 2018-02-08 MED ORDER — SODIUM CHLORIDE 0.9% FLUSH
10.0000 mL | INTRAVENOUS | Status: DC | PRN
  Administered 2018-02-08: 10 mL
  Filled 2018-02-08: qty 10

## 2018-02-08 MED ORDER — DIPHENHYDRAMINE HCL 25 MG PO CAPS
50.0000 mg | ORAL_CAPSULE | Freq: Once | ORAL | Status: AC
  Administered 2018-02-08: 50 mg via ORAL

## 2018-02-08 MED ORDER — SODIUM CHLORIDE 0.9 % IV SOLN
Freq: Once | INTRAVENOUS | Status: AC
  Administered 2018-02-08: 12:00:00 via INTRAVENOUS
  Filled 2018-02-08: qty 250

## 2018-02-08 MED ORDER — HEPARIN SOD (PORK) LOCK FLUSH 100 UNIT/ML IV SOLN
500.0000 [IU] | Freq: Once | INTRAVENOUS | Status: AC | PRN
  Administered 2018-02-08: 500 [IU]
  Filled 2018-02-08: qty 5

## 2018-02-08 MED ORDER — DIPHENHYDRAMINE HCL 25 MG PO CAPS
ORAL_CAPSULE | ORAL | Status: AC
  Filled 2018-02-08: qty 2

## 2018-02-08 MED ORDER — ACETAMINOPHEN 325 MG PO TABS
650.0000 mg | ORAL_TABLET | Freq: Once | ORAL | Status: AC
  Administered 2018-02-08: 650 mg via ORAL

## 2018-02-08 MED ORDER — ACETAMINOPHEN 325 MG PO TABS
ORAL_TABLET | ORAL | Status: AC
  Filled 2018-02-08: qty 2

## 2018-02-08 MED ORDER — TRASTUZUMAB CHEMO 150 MG IV SOLR
6.0000 mg/kg | Freq: Once | INTRAVENOUS | Status: AC
  Administered 2018-02-08: 567 mg via INTRAVENOUS
  Filled 2018-02-08: qty 27

## 2018-02-08 MED ORDER — AMLODIPINE BESYLATE 5 MG PO TABS: 5.0000 mg | ORAL_TABLET | Freq: Every day | ORAL | Status: DC

## 2018-02-08 NOTE — Assessment & Plan Note (Signed)
09/11/2017:Left lumpectomy: Grade 2 invasive lobular cancer, 1.1 cm, LCIS, margins negative, 0/2 lymph nodes negative, ER 90% strong staining, PR negative, HER-2 positive ratio 2.62, Ki-67 1%, T1c N0 stage Ia  Recommendation: 1.Adjuvant therapy with Taxol Herceptin weekly x12 followed by Herceptin maintenance for 1 year 2.Adjuvant radiation 3.Followed by adjuvant antiestrogen therapy ------------------------------------------------------------------ Current treatment: Herceptin maintenance therapy every 3 weeks Adjuvant radiation therapy at [place] peripheral neuropathy:  Return to clinic every 3 weeks for Herceptin every 6 weeks for follow-up with me with labs.

## 2018-02-08 NOTE — Patient Instructions (Signed)
[facility] Discharge Instructions for Patients Receiving Chemotherapy  Today you received the following chemotherapy agents Herceptin  To help prevent nausea and vomiting after your treatment, we encourage you to take your nausea medication as directed   If you develop nausea and vomiting that is not controlled by your nausea medication, call the clinic.   BELOW ARE SYMPTOMS THAT SHOULD BE REPORTED IMMEDIATELY:  *FEVER GREATER THAN 100.5 F  *CHILLS WITH OR WITHOUT FEVER  NAUSEA AND VOMITING THAT IS NOT CONTROLLED WITH YOUR NAUSEA MEDICATION  *UNUSUAL SHORTNESS OF BREATH  *UNUSUAL BRUISING OR BLEEDING  TENDERNESS IN MOUTH AND THROAT WITH OR WITHOUT PRESENCE OF ULCERS  *URINARY PROBLEMS  *BOWEL PROBLEMS  UNUSUAL RASH Items with * indicate a potential emergency and should be followed up as soon as possible.  Feel free to call the clinic should you have any questions or concerns. The clinic phone number is [phone].  Please show the CHEMO ALERT CARD at check-in to the Emergency Department and triage nurse.   

## 2018-02-08 NOTE — Telephone Encounter (Signed)
Pt scheduled for Urology appointment at [facility] for 02/15/2018 @ 10:45am.  Patient notified and voiced understanding.

## 2018-02-08 NOTE — Progress Notes (Signed)
 Patient Care Team: Sasser, Paul W, MD as PCP - General (Unknown Physician Specialty) Hoxworth, Benjamin, MD as Consulting Physician (General Surgery) Gudena, Vinay, MD as Consulting Physician (Hematology and Oncology) Kinard, James, MD as Consulting Physician (Radiation Oncology)  DIAGNOSIS:  Encounter Diagnosis  Name Primary?  . Malignant neoplasm of upper-outer quadrant of left breast in female, estrogen receptor positive (HCC)     SUMMARY OF ONCOLOGIC HISTORY:   Malignant neoplasm of upper-outer quadrant of left breast in female, estrogen receptor positive (HCC)   08/05/2017 Initial Diagnosis    Screening detected left breast calcifications UOQ 1.1 cm biopsy revealed invasive lobular cancer with LCIS and CSL, grade 2, ER 90%, PR 0%, Ki-67 1%, HER-2 positive ratio 2.62, T2N0 stage Ia clinical stage AJCC 8    08/12/2017 Cancer Staging    Staging form: Breast, AJCC 8th Edition - Clinical stage from 08/12/2017: Stage IA (cT1c, cN0, cM0, G2, ER+, PR-, HER2+) - Signed by Gudena, Vinay, MD on 08/12/2017    09/11/2017 Surgery    Left lumpectomy: Grade 2 invasive lobular cancer, 1.1 cm, LCIS, margins negative, 0/2 lymph nodes negative, ER 90% strong staining, PR negative, HER-2 positive ratio 2.62, Ki-67 1%, T1c N0 stage Ia    09/23/2017 Cancer Staging    Staging form: Breast, AJCC 8th Edition - Pathologic: Stage IA (pT1c, pN0, cM0, G2, ER+, PR-, HER2+) - Signed by Causey, Lindsey Cornetto, NP on 09/23/2017    11/02/2017 -  Chemotherapy    Adjuvant therapy with Taxol Herceptin weekly x12 followed by Herceptin maintenance for 1 year     CHIEF COMPLIANT: Follow-up on Herceptin  INTERVAL HISTORY: Sylvia Barnett is a 72-year with above-mentioned history of left breast cancer with lumpectomy adjuvant chemotherapy and is currently on Herceptin maintenance.  She is tolerating it extremely well.  She claims of the hair is coming back quickly.  Her energy levels are improving.  She saw  radiation oncology in [place] and the plan to start radiation in February.  REVIEW OF SYSTEMS:   Constitutional: Denies fevers, chills or abnormal weight loss Eyes: Denies blurriness of vision Ears, nose, mouth, throat, and face: Denies mucositis or sore throat Respiratory: Denies cough, dyspnea or wheezes Cardiovascular: Denies palpitation, chest discomfort Gastrointestinal:  Denies nausea, heartburn or change in bowel habits Skin: Denies abnormal skin rashes Lymphatics: Denies new lymphadenopathy or easy bruising Neurological:Denies numbness, tingling or new weaknesses Behavioral/Psych: Mood is stable, no new changes  Extremities: No lower extremity edema Breast:  denies any pain or lumps or nodules in either breasts All other systems were reviewed with the patient and are negative.  I have reviewed the past medical history, past surgical history, social history and family history with the patient and they are unchanged from previous note.  ALLERGIES:  is allergic to fiorinal-codeine #3 [butalbital-asa-caff-codeine]; lansoprazole; latex; butalbital-aspirin-caffeine; sulfa antibiotics; femara [letrozole]; and sulfasalazine.  MEDICATIONS:  Current Outpatient Medications  Medication Sig Dispense Refill  . atorvastatin (LIPITOR) 10 MG tablet Take 10 mg by mouth at bedtime.    . Calcium Carb-Cholecalciferol (CALCIUM 600+D) 600-800 MG-UNIT TABS Take 1 tablet by mouth daily.     . cephALEXin (KEFLEX) 500 MG capsule Take 1 capsule (500 mg total) by mouth 2 (two) times daily. 14 capsule 0  . cyclobenzaprine (FLEXERIL) 10 MG tablet Take 10 mg by mouth 3 (three) times daily as needed for muscle spasms.     . dicyclomine (BENTYL) 10 MG capsule Take 1 capsule (10 mg total) by mouth 3 (three) times   daily as needed for spasms. Patient states that she started this 3 weeks ago (Patient taking differently: Take 10 mg by mouth daily. ) 270 capsule 1  . escitalopram (LEXAPRO) 5 MG tablet Take 5 mg by mouth  daily.    . hydrochlorothiazide (HYDRODIURIL) 25 MG tablet Take 25 mg by mouth daily.    . hydroxypropyl methylcellulose / hypromellose (ISOPTO TEARS / GONIOVISC) 2.5 % ophthalmic solution Place 1 drop into both eyes 3 (three) times daily as needed for dry eyes.    Marland Kitchen loperamide (IMODIUM) 2 MG capsule Take 2 mg by mouth daily before breakfast.    . meloxicam (MOBIC) 7.5 MG tablet Take 7.5 mg by mouth 2 (two) times daily as needed for pain.     . Menthol, Topical Analgesic, (ICY HOT) 5 % PADS Apply 1 patch topically daily as needed (pain).     Marland Kitchen oxyCODONE (OXY IR/ROXICODONE) 5 MG immediate release tablet Take 1 tablet (5 mg total) by mouth every 6 (six) hours as needed for severe pain. 10 tablet 0   No current facility-administered medications for this visit.     PHYSICAL EXAMINATION: ECOG PERFORMANCE STATUS: 1 - Symptomatic but completely ambulatory  Vitals:   02/08/18 1018  BP: (!) 182/83  Pulse: 78  Resp: 18  Temp: 97.7 F (36.5 C)  SpO2: 100%   Filed Weights   02/08/18 1018  Weight: 211 lb 6.4 oz (95.9 kg)    GENERAL:alert, no distress and comfortable SKIN: skin color, texture, turgor are normal, no rashes or significant lesions EYES: normal, Conjunctiva are pink and non-injected, sclera clear OROPHARYNX:no exudate, no erythema and lips, buccal mucosa, and tongue normal  NECK: supple, thyroid normal size, non-tender, without nodularity LYMPH:  no palpable lymphadenopathy in the cervical, axillary or inguinal LUNGS: clear to auscultation and percussion with normal breathing effort HEART: regular rate & rhythm and no murmurs and no lower extremity edema ABDOMEN:abdomen soft, non-tender and normal bowel sounds MUSCULOSKELETAL:no cyanosis of digits and no clubbing  NEURO: alert & oriented x 3 with fluent speech, no focal motor/sensory deficits EXTREMITIES: No lower extremity edema   LABORATORY DATA:  I have reviewed the data as listed CMP Latest Ref Rng & Units 01/18/2018  01/11/2018 01/04/2018  Glucose 70 - 99 mg/dL 133(H) 93 121(H)  BUN 8 - 23 mg/dL _0 Creatinine 0.44 - 1.00 mg/dL 0.91 0.76 0.79  Sodium 135 - 145 mmol/L 144 144 145  Potassium 3.5 - 5.1 mmol/L 3.7 3.8 3.4(L)  Chloride 98 - 111 mmol/L 109 108 109  CO2 22 - 32 mmol/L _1 Calcium 8.9 - 10.3 mg/dL 9.0 9.0 8.9  Total Protein 6.5 - 8.1 g/dL 6.1(L) 5.9(L) 6.1(L)  Total Bilirubin 0.3 - 1.2 mg/dL 0.5 0.4 0.4  Alkaline Phos 38 - 126 U/L 75 63 68  AST 15 - 41 U/L 12(L) 12(L) 13(L)  ALT 0 - 44 U/L _2 Lab Results  Component Value Date   WBC 5.1 01/18/2018   HGB 11.3 (L) 01/18/2018   HCT 34.6 (L) 01/18/2018   MCV 97.7 01/18/2018   PLT 191 01/18/2018   NEUTROABS 3.7 01/18/2018    ASSESSMENT & PLAN:  Malignant neoplasm of upper-outer quadrant of left breast in female, estrogen receptor positive (HCC) 09/11/2017:Left lumpectomy: Grade 2 invasive lobular cancer, 1.1 cm, LCIS, margins negative, 0/2 lymph nodes negative, ER 90% strong staining, PR negative, HER-2 positive ratio 2.62, Ki-67 1%, T1c N0 stage Ia  Recommendation: 1.Adjuvant  therapy with Taxol Herceptin weekly x12 followed by Herceptin maintenance for 1 year 2.Adjuvant radiation 3.Followed by adjuvant antiestrogen therapy ------------------------------------------------------------------ Current treatment: Herceptin maintenance therapy every 3 weeks Adjuvant radiation therapy at [facility] will start in February Chemo-induced peripheral neuropathy:  Return to clinic every 3 weeks for Herceptin every 6 weeks for follow-up with me with labs.   No orders of the defined types were placed in this encounter.  The patient has a good understanding of the overall plan. she agrees with it. she will call with any problems that may develop before the next visit here.   Harriette Ohara, MD 02/08/18

## 2018-02-10 DIAGNOSIS — C50412 Malignant neoplasm of upper-outer quadrant of left female breast: Secondary | ICD-10-CM | POA: Diagnosis not present

## 2018-02-15 DIAGNOSIS — C50412 Malignant neoplasm of upper-outer quadrant of left female breast: Secondary | ICD-10-CM | POA: Diagnosis not present

## 2018-02-24 ENCOUNTER — Telehealth (INDEPENDENT_AMBULATORY_CARE_PROVIDER_SITE_OTHER): Payer: Self-pay | Admitting: *Deleted

## 2018-02-24 DIAGNOSIS — C50412 Malignant neoplasm of upper-outer quadrant of left female breast: Secondary | ICD-10-CM | POA: Diagnosis not present

## 2018-02-24 NOTE — Telephone Encounter (Signed)
Patient left a voicemail that she is taking the Dicyclomine twice a day. She has had 2 black stools with mucous. She is concerned. Patient is taking Herceptin until October, and she will be starting her radition tomorrow,02/25/2018. There was another chemo pill that she was taking but has completed that one. This is for the treatment of Breast Cancer..  Home Number is [phone] , [phone].

## 2018-02-24 NOTE — Telephone Encounter (Signed)
Per Dr. Laural Golden the patient should stop the Mobic. She may start taking the Prilosec OTC 20 mg daily. She will need to call and let the Cancer Clinic know that she has had 2 black stools with mucous also. If they want to continue with her radiation tomorrow I am okay with this. Patient will be seen in the office tomorrow and will need a CBC prior to the office visit. If patient gets light headed or sees bright red blood she should go to the ED for further evaluation.  Talked with the patient and she states that she only takes the Mobic on as needed basis , but she will stop.She states that she will bring her recent lab work in with her as she has been getting right much done. Her appointment is at 8:45 am for the radiation. She will be at our office at 1:30. She mentions that she did eat blueberry yogurt , drank some grape and cranberry juice yesterday also.

## 2018-02-25 ENCOUNTER — Ambulatory Visit (INDEPENDENT_AMBULATORY_CARE_PROVIDER_SITE_OTHER): Payer: Medicare Other | Admitting: Internal Medicine

## 2018-02-25 ENCOUNTER — Encounter (INDEPENDENT_AMBULATORY_CARE_PROVIDER_SITE_OTHER): Payer: Self-pay | Admitting: *Deleted

## 2018-02-25 ENCOUNTER — Other Ambulatory Visit (INDEPENDENT_AMBULATORY_CARE_PROVIDER_SITE_OTHER): Payer: Self-pay | Admitting: *Deleted

## 2018-02-25 ENCOUNTER — Encounter (INDEPENDENT_AMBULATORY_CARE_PROVIDER_SITE_OTHER): Payer: Self-pay | Admitting: Internal Medicine

## 2018-02-25 VITALS — BP 172/84 | HR 74 | Temp 98.1°F | Resp 18 | Ht 65.0 in | Wt 211.4 lb

## 2018-02-25 DIAGNOSIS — K921 Melena: Secondary | ICD-10-CM | POA: Diagnosis not present

## 2018-02-25 DIAGNOSIS — C50412 Malignant neoplasm of upper-outer quadrant of left female breast: Secondary | ICD-10-CM | POA: Diagnosis not present

## 2018-02-25 MED ORDER — ESOMEPRAZOLE MAGNESIUM 20 MG PO CPDR: 20.0000 mg | DELAYED_RELEASE_CAPSULE | Freq: Two times a day (BID) | ORAL | 0 refills | Status: DC

## 2018-02-25 NOTE — Progress Notes (Signed)
Presenting complaint;  Melena.  Database and subjective:  Patient is 73 year old Caucasian female who has history of lymphocytic colitis/IBS and was last seen in the office on 08/04/2017.  She was doing well with therapy. In August she was diagnosed with left breast carcinoma picked up on screening mammography.  She underwent lumpectomy and she is on Herceptin infusion every 3 weeks and she started radiation therapy today.  She has 12 more radiation therapy sessions to go. Patient called our office yesterday stating that she had melena on the night of 12/24/2018.  She had 2 tarry stools.  She did not experience nausea vomiting or abdominal pain.  She also did not experience postural symptoms.  She states she was recently switched to amlodipine and started to have daily heartburn.  She is now back on HCTZ and amlodipine was discontinued and she is not having heartburn like before.  She has sporadic heartburn for which uses Tums.  She is not taking Imodium on as-needed basis. She takes meloxicam no more than a couple of doses per month.  Yesterday when she called our office she was advised not to take meloxicam and use Tylenol instead.  She is now passing brown stool.  No history of peptic ulcer disease.  She states she has lost close to 8 pounds in the last 2 months. She is receiving Herceptin infusion every 3 weeks. Review of her records revealed that Prevacid/lansoprazole as listed as 1 of her allergies.  She states when she took this medication she developed swelling to her hands.  She did not have skin rash or breathing difficulty.  She has not tried other PPIs.   Current Medications: Outpatient Encounter Medications as of 02/25/2018  Medication Sig  . atorvastatin (LIPITOR) 10 MG tablet Take 10 mg by mouth at bedtime.  . cyclobenzaprine (FLEXERIL) 10 MG tablet Take 10 mg by mouth 3 (three) times daily as needed for muscle spasms.   Marland Kitchen dicyclomine (BENTYL) 10 MG capsule Take 1 capsule (10 mg  total) by mouth 3 (three) times daily as needed for spasms. Patient states that she started this 3 weeks ago (Patient taking differently: Take 10 mg by mouth daily. )  . escitalopram (LEXAPRO) 5 MG tablet Take 5 mg by mouth daily.  . hydroxypropyl methylcellulose / hypromellose (ISOPTO TEARS / GONIOVISC) 2.5 % ophthalmic solution Place 1 drop into both eyes 3 (three) times daily as needed for dry eyes.  Marland Kitchen loperamide (IMODIUM) 2 MG capsule Take 2 mg by mouth daily before breakfast.  . Menthol, Topical Analgesic, (ICY HOT) 5 % PADS Apply 1 patch topically daily as needed (pain).   . Trastuzumab (HERCEPTIN IV) Inject into the vein. Patient will need to take until October 2020  . hydrochlorothiazide (HYDRODIURIL) 25 MG tablet Take 25 mg by mouth daily.  . meloxicam (MOBIC) 7.5 MG tablet Take 7.5 mg by mouth 2 (two) times daily as needed for pain.   . [DISCONTINUED] amLODipine (NORVASC) 5 MG tablet Take 1 tablet (5 mg total) by mouth daily. (Patient not taking: Reported on 02/25/2018)  . [DISCONTINUED] Calcium Carb-Cholecalciferol (CALCIUM 600+D) 600-800 MG-UNIT TABS Take 1 tablet by mouth daily.   . [DISCONTINUED] prochlorperazine (COMPAZINE) 10 MG tablet Take 1 tablet (10 mg total) by mouth every 6 (six) hours as needed (Nausea or vomiting).   No facility-administered encounter medications on file as of 02/25/2018.    Past Medical History:  Diagnosis Date  . Anxiety    Since in her 20's  . Arthritis 2013  Right hip  . Cancer [facility]) 2004    Breast Lumpectomy  . Complication of anesthesia    Has a small throat  . Depression    Started when she was in her 20's  . Hypercholesteremia 2012  . Hypertension 2013  . IBS (irritable bowel syndrome) 2018   per GI note  . Incontinence-urinary 2012  . Lobular carcinoma in situ (LCIS) of left breast 09/08/2008  . Melanoma of back ([place]) 1994   Excision with clear margins  . Microscopic colitis 2015   Resolved 2016   Past Surgical History:  Procedure  Laterality Date  . ABDOMINAL HYSTERECTOMY  2002  . APPENDECTOMY  1949  . BIOPSY BREAST  2010   Left  . Bladder Tack  2002  . BREAST LUMPECTOMY  2004   Left  . BREAST LUMPECTOMY  2004   Left  . BREAST LUMPECTOMY  2006   Left  . BREAST LUMPECTOMY  2000   Left  . BREAST LUMPECTOMY WITH RADIOACTIVE SEED AND SENTINEL LYMPH NODE BIOPSY Left 09/11/2017   Procedure: LEFT BREAST LUMPECTOMY X2 WITH RADIOACTIVE SEED AND LEFT AXILLARY SENTINEL LYMPH NODE BIOPSY;  Surgeon: Excell Seltzer, MD;  Location: [facility];  Service: General;  Laterality: Left;  . BUNIONECTOMY  1994   Right  . CATARACT EXTRACTION W/PHACO Left 08/18/2013   Procedure: CATARACT EXTRACTION PHACO AND INTRAOCULAR LENS PLACEMENT (IOC);  Surgeon: Tonny Branch, MD;  Location: [facility];  Service: Ophthalmology;  Laterality: Left;  CDE: 8.62  . COLONOSCOPY N/A 11/10/2013   Procedure: COLONOSCOPY;  Surgeon: Rogene Houston, MD;  [facility];  Service: Endoscopy;  Laterality: N/A;  240  . EYE SURGERY  2011   Catarct Right  . EYE SURGERY  08-2011   Right Yag procedure  . JOINT REPLACEMENT  08-2010   Left total knee  . KNEE ARTHROSCOPY  2010   Right  . KNEE ARTHROSCOPY  2003   Left  . PORTACATH PLACEMENT N/A 09/11/2017   Procedure: INSERTION PORT-A-CATH;  Surgeon: Excell Seltzer, MD;  Location: [place];  Service: General;  Laterality: N/A;  . TOTAL KNEE ARTHROPLASTY  11/03/2011   Procedure: TOTAL KNEE ARTHROPLASTY;  Surgeon: Gearlean Alf, MD;  Location: [facility];  Service: Orthopedics;  Laterality: Right;  . TUBAL LIGATION    . WRIST SURGERY Bilateral 03-2010  . WRIST SURGERY  2008   Right     Objective: Blood pressure (!) 172/84, pulse 74, temperature 98.1 F (36.7 C), temperature source Oral, resp. rate 18, height '5\' 5"'  (1.651 m), weight 211 lb 6.4 oz (95.9 kg). Patient is alert and in no acute distress. Conjunctiva is pink. Sclera is nonicteric Oropharyngeal mucosa is normal. No neck masses or thyromegaly  noted. Cardiac exam with regular rhythm normal S1 and S2. No murmur or gallop noted. Lungs are clear to auscultation. Abdomen is full.  On palpation it is soft and nontender with organomegaly or masses. Rectal examination reveals no external abnormality.  Digital exam reveals scant amount of stool in the rectum.  Stool is brown in color but guaiac positive. No LE edema or clubbing noted.  Labs/studies Results:  CBC Latest Ref Rng & Units 02/08/2018 01/18/2018 01/11/2018  WBC 4.0 - 10.5 K/uL 5.8 5.1 4.6  Hemoglobin 12.0 - 15.0 g/dL 11.7(L) 11.3(L) 10.8(L)  Hematocrit 36.0 - 46.0 % 36.8 34.6(L) 33.2(L)  Platelets 150 - 400 K/uL 172 191 168    CMP Latest Ref Rng & Units 02/08/2018 01/18/2018 01/11/2018  Glucose 70 -  99 mg/dL 92 133(H) 93  BUN 8 - 23 mg/dL '14 14 17  ' Creatinine 0.44 - 1.00 mg/dL 0.73 0.91 0.76  Sodium 135 - 145 mmol/L 145 144 144  Potassium 3.5 - 5.1 mmol/L 3.9 3.7 3.8  Chloride 98 - 111 mmol/L 108 109 108  CO2 22 - 32 mmol/L '29 27 29  ' Calcium 8.9 - 10.3 mg/dL 9.2 9.0 9.0  Total Protein 6.5 - 8.1 g/dL 6.6 6.1(L) 5.9(L)  Total Bilirubin 0.3 - 1.2 mg/dL 0.5 0.5 0.4  Alkaline Phos 38 - 126 U/L 83 75 63  AST 15 - 41 U/L 15 12(L) 12(L)  ALT 0 - 44 U/L '18 18 20    ' Hepatic Function Latest Ref Rng & Units 02/08/2018 01/18/2018 01/11/2018  Total Protein 6.5 - 8.1 g/dL 6.6 6.1(L) 5.9(L)  Albumin 3.5 - 5.0 g/dL 3.7 3.4(L) 3.2(L)  AST 15 - 41 U/L 15 12(L) 12(L)  ALT 0 - 44 U/L '18 18 20  ' Alk Phosphatase 38 - 126 U/L 83 75 63  Total Bilirubin 0.3 - 1.2 mg/dL 0.5 0.5 0.4      Assessment:  Patient is 73 year old Caucasian female who presents with history of melena.  Melena has cleared.  Rectal exam reveals heme positive stool.  It is interesting to note that had a period with frequent heartburn thought to be due to amlodipine.  She is on meloxicam but she does not use it very often.  She possibly has peptic ulcer disease.  I doubt that it has anything to do with Herceptin. Hemoglobin on  02/08/2018 was 11.7.  Plan:  Patient advised not to take meloxicam or other NSAIDs until further notice. She can take Tylenol up to 2 g/day in divided dose on as-needed basis for musculoskeletal pain. Begin Nexium OTC 20 mg p.o. twice daily.  H doses provided to the patient. Diagnostic esophagogastroduodenoscopy on 02/26/2018. She is to have blood work on 03/01/2018.  Therefore will hold off blood work today.

## 2018-02-25 NOTE — Patient Instructions (Signed)
Do not take Mobic/meloxicam for now. Can take Tylenol up to 2 g/day on as-needed basis for joint pain. Esophagogastroduodenoscopy to be scheduled.

## 2018-02-26 ENCOUNTER — Encounter (HOSPITAL_COMMUNITY)
Admission: RE | Disposition: A | Discharge status: Home / Self Care | Payer: Self-pay | Source: Ambulatory Visit | Attending: Internal Medicine

## 2018-02-26 ENCOUNTER — Other Ambulatory Visit: Payer: Self-pay

## 2018-02-26 ENCOUNTER — Ambulatory Visit (HOSPITAL_COMMUNITY)
Admission: RE | Admit: 2018-02-26 | Discharge: 2018-02-26 | Disposition: A | Discharge status: Home / Self Care | Payer: Medicare Other | Source: Ambulatory Visit | Attending: Internal Medicine | Admitting: Internal Medicine

## 2018-02-26 ENCOUNTER — Telehealth (INDEPENDENT_AMBULATORY_CARE_PROVIDER_SITE_OTHER): Payer: Self-pay | Admitting: Internal Medicine

## 2018-02-26 DIAGNOSIS — K254 Chronic or unspecified gastric ulcer with hemorrhage: Secondary | ICD-10-CM | POA: Insufficient documentation

## 2018-02-26 DIAGNOSIS — K921 Melena: Secondary | ICD-10-CM

## 2018-02-26 DIAGNOSIS — D0502 Lobular carcinoma in situ of left breast: Secondary | ICD-10-CM | POA: Insufficient documentation

## 2018-02-26 DIAGNOSIS — K319 Disease of stomach and duodenum, unspecified: Secondary | ICD-10-CM | POA: Diagnosis not present

## 2018-02-26 DIAGNOSIS — Z791 Long term (current) use of non-steroidal anti-inflammatories (NSAID): Secondary | ICD-10-CM | POA: Insufficient documentation

## 2018-02-26 DIAGNOSIS — I10 Essential (primary) hypertension: Secondary | ICD-10-CM | POA: Diagnosis not present

## 2018-02-26 DIAGNOSIS — R921 Mammographic calcification found on diagnostic imaging of breast: Secondary | ICD-10-CM | POA: Diagnosis not present

## 2018-02-26 DIAGNOSIS — Z85820 Personal history of malignant melanoma of skin: Secondary | ICD-10-CM | POA: Insufficient documentation

## 2018-02-26 DIAGNOSIS — K449 Diaphragmatic hernia without obstruction or gangrene: Secondary | ICD-10-CM | POA: Insufficient documentation

## 2018-02-26 DIAGNOSIS — C50412 Malignant neoplasm of upper-outer quadrant of left female breast: Secondary | ICD-10-CM | POA: Diagnosis not present

## 2018-02-26 DIAGNOSIS — Z96653 Presence of artificial knee joint, bilateral: Secondary | ICD-10-CM | POA: Diagnosis not present

## 2018-02-26 DIAGNOSIS — Z79899 Other long term (current) drug therapy: Secondary | ICD-10-CM | POA: Insufficient documentation

## 2018-02-26 DIAGNOSIS — Z170 Estrogen receptor positive status [ER+]: Secondary | ICD-10-CM | POA: Diagnosis not present

## 2018-02-26 DIAGNOSIS — K2970 Gastritis, unspecified, without bleeding: Secondary | ICD-10-CM | POA: Diagnosis not present

## 2018-02-26 HISTORY — PX: BIOPSY: SHX5522

## 2018-02-26 HISTORY — PX: ESOPHAGOGASTRODUODENOSCOPY: SHX5428

## 2018-02-26 LAB — HEMOGLOBIN AND HEMATOCRIT, BLOOD
HCT: 35.8 % — ABNORMAL LOW (ref 36.0–46.0)
Hemoglobin: 11.2 g/dL — ABNORMAL LOW (ref 12.0–15.0)

## 2018-02-26 SURGERY — EGD (ESOPHAGOGASTRODUODENOSCOPY)
Anesthesia: Moderate Sedation

## 2018-02-26 MED ORDER — STERILE WATER FOR IRRIGATION IR SOLN
Status: DC | PRN
  Administered 2018-02-26: 11:00:00

## 2018-02-26 MED ORDER — MEPERIDINE HCL 50 MG/ML IJ SOLN
INTRAMUSCULAR | Status: AC
  Filled 2018-02-26: qty 1

## 2018-02-26 MED ORDER — SODIUM CHLORIDE 0.9 % IV SOLN
INTRAVENOUS | Status: DC
  Administered 2018-02-26: 10:00:00 via INTRAVENOUS

## 2018-02-26 MED ORDER — ESOMEPRAZOLE MAGNESIUM 20 MG PO CPDR: 20.0000 mg | DELAYED_RELEASE_CAPSULE | Freq: Every day | ORAL | 0 refills | Status: DC

## 2018-02-26 MED ORDER — MEPERIDINE HCL 50 MG/ML IJ SOLN
INTRAMUSCULAR | Status: DC | PRN
  Administered 2018-02-26 (×2): 25 mg via INTRAVENOUS

## 2018-02-26 MED ORDER — LIDOCAINE VISCOUS HCL 2 % MT SOLN
OROMUCOSAL | Status: AC
  Filled 2018-02-26: qty 15

## 2018-02-26 MED ORDER — LIDOCAINE VISCOUS HCL 2 % MT SOLN
OROMUCOSAL | Status: DC | PRN
  Administered 2018-02-26: 4 mL via OROMUCOSAL

## 2018-02-26 MED ORDER — MIDAZOLAM HCL 5 MG/5ML IJ SOLN
INTRAMUSCULAR | Status: DC | PRN
  Administered 2018-02-26: 1 mg via INTRAVENOUS
  Administered 2018-02-26 (×2): 2 mg via INTRAVENOUS

## 2018-02-26 MED ORDER — MIDAZOLAM HCL 5 MG/5ML IJ SOLN
INTRAMUSCULAR | Status: AC
  Filled 2018-02-26: qty 10

## 2018-02-26 NOTE — H&P (Signed)
Sylvia Barnett is an 73 y.o. female.   Chief Complaint: Sylvia Barnett is here for EGD. HPI: Sylvia Barnett 73 year old Caucasian female was seen in the office yesterday with history of melena which occurred 2 days ago.  She was noted to have heme positive stool on rectal exam.  Few weeks ago she had daily heartburn for at least 10 days and her blood pressure medication was switched to amlodipine.  She says amlodipine was discontinued and it took her a few more days before her heartburn resolved.  She has not had nausea vomiting or abdominal pain.  She has not been diarrhea.  She has history of lymphocytic colitis.  She uses meloxicam no more than couple of times a month.  There is no history of peptic ulcer disease.  She is undergoing radiation therapy for breast carcinoma. Her hemoglobin 20 half weeks ago was 11.7.  She is to have CBC next week Monday.  Past Medical History:  Diagnosis Date  . Anxiety    Since in her 20's  . Arthritis 2013   Right hip  . Cancer [facility]) 2004    Breast Lumpectomy  . Complication of anesthesia    Has a small throat  . Depression    Started when she was in her 4's  . Hypercholesteremia 2012  . Hypertension 2013  . IBS (irritable bowel syndrome) 2018   per GI note  . Incontinence-urinary 2012  . Lobular carcinoma in situ (LCIS) of left breast 09/08/2008  . Melanoma of back ([place]) 1994   Excision with clear margins  . Microscopic colitis 2015   Resolved 2016    Past Surgical History:  Procedure Laterality Date  . ABDOMINAL HYSTERECTOMY  2002  . APPENDECTOMY  1949  . BIOPSY BREAST  2010   Left  . Bladder Tack  2002  . BREAST LUMPECTOMY  2004   Left  . BREAST LUMPECTOMY  2004   Left  . BREAST LUMPECTOMY  2006   Left  . BREAST LUMPECTOMY  2000   Left  . BREAST LUMPECTOMY WITH RADIOACTIVE SEED AND SENTINEL LYMPH NODE BIOPSY Left 09/11/2017   Procedure: LEFT BREAST LUMPECTOMY X2 WITH RADIOACTIVE SEED AND LEFT AXILLARY SENTINEL LYMPH NODE BIOPSY;  Surgeon:  Excell Seltzer, MD;  Location: [place];  Service: General;  Laterality: Left;  . BUNIONECTOMY  1994   Right  . CATARACT EXTRACTION W/PHACO Left 08/18/2013   Procedure: CATARACT EXTRACTION PHACO AND INTRAOCULAR LENS PLACEMENT (IOC);  Surgeon: Tonny Branch, MD;  [facility];  Service: Ophthalmology;  Laterality: Left;  CDE: 8.62  . COLONOSCOPY N/A 11/10/2013   Procedure: COLONOSCOPY;  Surgeon: Rogene Houston, MD;  [facility];  Service: Endoscopy;  Laterality: N/A;  240  . EYE SURGERY  2011   Catarct Right  . EYE SURGERY  08-2011   Right Yag procedure  . JOINT REPLACEMENT  08-2010   Left total knee  . KNEE ARTHROSCOPY  2010   Right  . KNEE ARTHROSCOPY  2003   Left  . PORTACATH PLACEMENT N/A 09/11/2017   Procedure: INSERTION PORT-A-CATH;  Surgeon: Excell Seltzer, MD;  Location: [place];  Service: General;  Laterality: N/A;  . TOTAL KNEE ARTHROPLASTY  11/03/2011   Procedure: TOTAL KNEE ARTHROPLASTY;  Surgeon: Gearlean Alf, MD;  [facility];  Service: Orthopedics;  Laterality: Right;  . TUBAL LIGATION    . WRIST SURGERY Bilateral 03-2010  . WRIST SURGERY  2008   Right    Family History  Problem  Relation Age of Onset  . Breast cancer Sister 48  . Lymphoma Maternal Aunt    Social History:  reports that she has never smoked. She has never used smokeless tobacco. She reports that she does not drink alcohol or use drugs.  Allergies:  Allergies  Allergen Reactions  . Fiorinal-Codeine #3 [Butalbital-Asa-Caff-Codeine] Rash  . Lansoprazole Swelling  . Latex Swelling, Other (See Comments), Itching and Rash    blisters  . Butalbital-Aspirin-Caffeine Rash  . Sulfa Antibiotics Rash  . Femara [Letrozole] Rash    Rash when tried in 1984  . Sulfasalazine Rash    Medications Prior to Admission  Medication Sig Dispense Refill  . acetaminophen (TYLENOL) 325 MG tablet Take 325 mg by mouth every 6 (six) hours as needed.    Marland Kitchen atorvastatin (LIPITOR) 10 MG tablet Take 10  mg by mouth at bedtime.    . cyclobenzaprine (FLEXERIL) 10 MG tablet Take 10 mg by mouth 3 (three) times daily as needed for muscle spasms.     Marland Kitchen dicyclomine (BENTYL) 10 MG capsule Take 1 capsule (10 mg total) by mouth 3 (three) times daily as needed for spasms. Sylvia Barnett states that she started this 3 weeks ago (Sylvia Barnett taking differently: Take 10 mg by mouth 2 (two) times daily before a meal. ) 270 capsule 1  . escitalopram (LEXAPRO) 5 MG tablet Take 5 mg by mouth at bedtime.     Marland Kitchen esomeprazole (NEXIUM) 20 MG capsule Take 1 capsule (20 mg total) by mouth 2 (two) times daily before a meal. 8 capsule 0  . hydrochlorothiazide (HYDRODIURIL) 25 MG tablet Take 25 mg by mouth daily.    . hydroxypropyl methylcellulose / hypromellose (ISOPTO TEARS / GONIOVISC) 2.5 % ophthalmic solution Place 1 drop into both eyes 3 (three) times daily as needed for dry eyes.    Marland Kitchen loperamide (IMODIUM) 2 MG capsule Take 2 mg by mouth 2 (two) times daily.     . meloxicam (MOBIC) 7.5 MG tablet Take 7.5 mg by mouth 2 (two) times daily as needed for pain.     . Menthol, Topical Analgesic, (ICY HOT) 5 % PADS Apply 1 patch topically daily as needed (pain).     . Trastuzumab (HERCEPTIN IV) Inject into the vein. Sylvia Barnett will need to take until October 2020      No results found for this or any previous visit (from the past 48 hour(s)). No results found.  ROS  Blood pressure (!) 153/75, pulse (!) 55, temperature 98 F (36.7 C), temperature source Oral, resp. rate 11, SpO2 100 %. Physical Exam  Constitutional: She appears well-developed and well-nourished.  HENT:  Mouth/Throat: Oropharynx is clear and moist.  Eyes: Conjunctivae are normal. No scleral icterus.  Neck: No thyromegaly present.  Cardiovascular: Normal rate, regular rhythm and normal heart sounds.  No murmur heard. Respiratory: Effort normal and breath sounds normal.  GI:  Abdomen is full.  It is soft with mild midepigastric tenderness.  No organomegaly or  masses.  Musculoskeletal:     Comments: Nonpitting pretibial edema.  Lymphadenopathy:    She has no cervical adenopathy.  Neurological: She is alert.  Skin: Skin is warm and dry.     Assessment/Plan Melena. Diagnostic EGD.  Hildred Laser, MD 02/26/2018, 10:32 AM

## 2018-02-26 NOTE — Op Note (Signed)
[facility] Patient Name: Sylvia Barnett Procedure Date: 02/26/2018 10:23 AM MRN: [id number] Date of Birth: Feb 13, 1945 Attending MD: Hildred Laser , MD CSN: [id number] Age: 73 Admit Type: Outpatient Procedure:                Upper GI endoscopy Indications:              Melena Providers:                Hildred Laser, MD, Jeanann Lewandowsky. Sharon Seller, RN, Nelma Rothman, Technician Referring MD:             Manon Hilding, MD Medicines:                Lidocaine spray, Meperidine 50 mg IV, Midazolam 5                            mg IV Complications:            No immediate complications. Estimated Blood Loss:     Estimated blood loss was minimal. Procedure:                Pre-Anesthesia Assessment:                           - Prior to the procedure, a History and Physical                            was performed, and patient medications and                            allergies were reviewed. The patient's tolerance of                            previous anesthesia was also reviewed. The risks                            and benefits of the procedure and the sedation                            options and risks were discussed with the patient.                            All questions were answered, and informed consent                            was obtained. Prior Anticoagulants: The patient                            last took previous NSAID medication 14 days prior                            to the procedure. ASA Grade Assessment: II - A  patient with mild systemic disease. After reviewing                            the risks and benefits, the patient was deemed in                            satisfactory condition to undergo the procedure.                           After obtaining informed consent, the endoscope was                            passed under direct vision. Throughout the                            procedure, the patient's blood  pressure, pulse, and                            oxygen saturations were monitored continuously. The                            [id number]-H190 ([id number]) scope was introduced through the                            mouth, and advanced to the second part of duodenum.                            The upper GI endoscopy was accomplished without                            difficulty. The patient tolerated the procedure                            well. Scope In: 10:43:50 AM Scope Out: 10:51:13 AM Total Procedure Duration: 0 hours 7 minutes 23 seconds  Findings:      The examined esophagus was normal.      The Z-line was regular and was found 35 cm from the incisors.      A 3 cm hiatal hernia was present.      Patchy inflammation characterized by erosions and erythema was found in       the gastric antrum. Biopsies were taken with a cold forceps for       histology.      The exam of the stomach was otherwise normal.      The duodenal bulb and second portion of the duodenum were normal. Impression:               - Normal esophagus.                           - Z-line regular, 35 cm from the incisors.                           - 3 cm hiatal hernia.                           -  Gastritis. Biopsied.                           - Normal duodenal bulb and second portion of the                            duodenum. Moderate Sedation:      Moderate (conscious) sedation was administered by the endoscopy nurse       and supervised by the endoscopist. The following parameters were       monitored: oxygen saturation, heart rate, blood pressure, CO2       capnography and response to care. Total physician intraservice time was       13 minutes. Recommendation:           - Patient has a contact number available for                            emergencies. The signs and symptoms of potential                            delayed complications were discussed with the                            patient. Return to normal  activities tomorrow.                            Written discharge instructions were provided to the                            patient.                           - Resume previous diet today.                           - Continue present medications.                           - Check H/H today.                           - No aspirin, ibuprofen, naproxen, or other                            non-steroidal anti-inflammatory drugs.                           - Await pathology results. Procedure Code(s):        --- Professional ---                           [phone], Esophagogastroduodenoscopy, flexible,                            transoral; with biopsy, single or multiple  G0500, Moderate sedation services provided by the                            same physician or other qualified health care                            professional performing a gastrointestinal                            endoscopic service that sedation supports,                            requiring the presence of an independent trained                            observer to assist in the monitoring of the                            patient's level of consciousness and physiological                            status; initial 15 minutes of intra-service time;                            patient age 63 years or older (additional time may                            be reported with [phone], as appropriate) Diagnosis Code(s):        --- Professional ---                           K44.9, Diaphragmatic hernia without obstruction or                            gangrene                           K29.70, Gastritis, unspecified, without bleeding                           K92.1, Melena (includes Hematochezia) CPT copyright 2018 [employer]. All rights reserved. The codes documented in this report are preliminary and upon coder review may  be revised to meet current compliance requirements. Hildred Laser, MD Hildred Laser, MD 02/26/2018 11:06:57 AM This report has been signed electronically. Number of Addenda: 0

## 2018-02-26 NOTE — Telephone Encounter (Signed)
Patient left message stating to let you know she is to have lab work done around 3-9

## 2018-02-26 NOTE — Discharge Instructions (Signed)
Hiatal Hernia  A hiatal hernia occurs when part of the stomach slides above the muscle that separates the abdomen from the chest (diaphragm). A person can be born with a hiatal hernia (congenital), or it may develop over time. In almost all cases of hiatal hernia, only the top part of the stomach pushes through the diaphragm. Many people have a hiatal hernia with no symptoms. The larger the hernia, the more likely it is that you will have symptoms. In some cases, a hiatal hernia allows stomach acid to flow back into the tube that carries food from your mouth to your stomach (esophagus). This may cause heartburn symptoms. Severe heartburn symptoms may mean that you have developed a condition called gastroesophageal reflux disease (GERD). What are the causes? This condition is caused by a weakness in the opening (hiatus) where the esophagus passes through the diaphragm to attach to the upper part of the stomach. A person may be born with a weakness in the hiatus, or a weakness can develop over time. What increases the risk? This condition is more likely to develop in:  Older people. Age is a major risk factor for a hiatal hernia, especially if you are over the age of 32.  Pregnant women.  People who are overweight.  People who have frequent constipation. What are the signs or symptoms? Symptoms of this condition usually develop in the form of GERD symptoms. Symptoms include:  Heartburn.  Belching.  Indigestion.  Trouble swallowing.  Coughing or wheezing.  Sore throat.  Hoarseness.  Chest pain.  Nausea and vomiting. How is this diagnosed? This condition may be diagnosed during testing for GERD. Tests that may be done include:  X-rays of your stomach or chest.  An upper gastrointestinal (GI) series. This is an X-ray exam of your GI tract that is taken after you swallow a chalky liquid that shows up clearly on the X-ray.  Endoscopy. This is a procedure to look into your stomach  using a thin, flexible tube that has a tiny camera and light on the end of it. How is this treated? This condition may be treated by:  Dietary and lifestyle changes to help reduce GERD symptoms.  Medicines. These may include: ? Over-the-counter antacids. ? Medicines that make your stomach empty more quickly. ? Medicines that block the production of stomach acid (H2 blockers). ? Stronger medicines to reduce stomach acid (proton pump inhibitors).  Surgery to repair the hernia, if other treatments are not helping. If you have no symptoms, you may not need treatment. Follow these instructions at home: Lifestyle and activity  Do not use any products that contain nicotine or tobacco, such as cigarettes and e-cigarettes. If you need help quitting, ask your health care provider.  Try to achieve and maintain a healthy body weight.  Avoid putting pressure on your abdomen. Anything that puts pressure on your abdomen increases the amount of acid that may be pushed up into your esophagus. ? Avoid bending over, especially after eating. ? Raise the head of your bed by putting blocks under the legs. This keeps your head and esophagus higher than your stomach. ? Do not wear tight clothing around your chest or stomach. ? Try not to strain when having a bowel movement, when urinating, or when lifting heavy objects. Eating and drinking  Avoid foods that can worsen GERD symptoms. These may include: ? Fatty foods, like fried foods. ? Citrus fruits, like oranges or lemon. ? Other foods and drinks that contain acid, like  orange juice or tomatoes. ? Spicy food. ? Chocolate.  Eat frequent small meals instead of three large meals a day. This helps prevent your stomach from getting too full. ? Eat slowly. ? Do not lie down right after eating. ? Do not eat 1-2 hours before bed.  Do not drink beverages with caffeine. These include cola, coffee, cocoa, and tea.  Do not drink alcohol. General  instructions  Take over-the-counter and prescription medicines only as told by your health care provider.  Keep all follow-up visits as told by your health care provider. This is important. Contact a health care provider if:  Your symptoms are not controlled with medicines or lifestyle changes.  You are having trouble swallowing.  You have coughing or wheezing that will not go away. Get help right away if:  Your pain is getting worse.  Your pain spreads to your arms, neck, jaw, teeth, or back.  You have shortness of breath.  You sweat for no reason.  You feel sick to your stomach (nauseous) or you vomit.  You vomit blood.  You have bright red blood in your stools.  You have black, tarry stools. This information is not intended to replace advice given to you by your health care provider. Make sure you discuss any questions you have with your health care provider. Document Released: 03/22/2003 Document Revised: 08/04/2016 Document Reviewed: 08/04/2016 Elsevier Interactive Patient Education  2019 [facility], Adult, Care After This sheet gives you information about how to care for yourself after your procedure. Your health care provider may also give you more specific instructions. If you have problems or questions, contact your health care provider. What can I expect after the procedure? After the procedure, it is common to have:  A sore throat.  Mild stomach pain or discomfort.  Bloating.  Nausea. Follow these instructions at home:   Follow instructions from your health care provider about what to eat or drink after your procedure.  Return to your normal activities as told by your health care provider. Ask your health care provider what activities are safe for you.  Take over-the-counter and prescription medicines only as told by your health care provider.  Do not drive for 24 hours if you were given a sedative during your procedure.  Keep all  follow-up visits as told by your health care provider. This is important. Contact a health care provider if you have:  A sore throat that lasts longer than one day.  Trouble swallowing. Get help right away if:  You vomit blood or your vomit looks like coffee grounds.  You have: ? A fever. ? Bloody, black, or tarry stools. ? A severe sore throat or you cannot swallow. ? Difficulty breathing. ? Severe pain in your chest or abdomen. Summary  After the procedure, it is common to have a sore throat, mild stomach discomfort, bloating, and nausea.  Do not drive for 24 hours if you were given a sedative during the procedure.  Follow instructions from your health care provider about what to eat or drink after your procedure.  Return to your normal activities as told by your health care provider. This information is not intended to replace advice given to you by your health care provider. Make sure you discuss any questions you have with your health care provider. Document Released: 07/01/2011 Document Revised: 06/01/2017 Document Reviewed: 06/01/2017 Elsevier Interactive Patient Education  2019 [employer] Take Nexium OTC 20 mg by mouth 30 minutes before breakfast daily.  Resume other medications as before. Resume usual diet. No driving for 24 hours. Physician will call with results of biopsy.

## 2018-02-26 NOTE — Telephone Encounter (Signed)
I checked hemoglobin at the time of EGD and it is 11.2.

## 2018-03-01 ENCOUNTER — Inpatient Hospital Stay: Payer: Medicare Other | Attending: Hematology and Oncology

## 2018-03-01 ENCOUNTER — Other Ambulatory Visit: Payer: Medicare Other

## 2018-03-01 VITALS — BP 164/86 | HR 72 | Temp 98.2°F | Resp 16

## 2018-03-01 DIAGNOSIS — Z5112 Encounter for antineoplastic immunotherapy: Secondary | ICD-10-CM | POA: Insufficient documentation

## 2018-03-01 DIAGNOSIS — Z170 Estrogen receptor positive status [ER+]: Principal | ICD-10-CM

## 2018-03-01 DIAGNOSIS — C50412 Malignant neoplasm of upper-outer quadrant of left female breast: Secondary | ICD-10-CM | POA: Insufficient documentation

## 2018-03-01 MED ORDER — SODIUM CHLORIDE 0.9% FLUSH
10.0000 mL | INTRAVENOUS | Status: DC | PRN
  Administered 2018-03-01: 10 mL
  Filled 2018-03-01: qty 10

## 2018-03-01 MED ORDER — DIPHENHYDRAMINE HCL 25 MG PO CAPS
50.0000 mg | ORAL_CAPSULE | Freq: Once | ORAL | Status: AC
  Administered 2018-03-01: 50 mg via ORAL

## 2018-03-01 MED ORDER — DIPHENHYDRAMINE HCL 25 MG PO CAPS
ORAL_CAPSULE | ORAL | Status: AC
  Filled 2018-03-01: qty 2

## 2018-03-01 MED ORDER — ACETAMINOPHEN 325 MG PO TABS: 650.0000 mg | ORAL_TABLET | Freq: Once | ORAL | Status: DC

## 2018-03-01 MED ORDER — TRASTUZUMAB CHEMO 150 MG IV SOLR
6.0000 mg/kg | Freq: Once | INTRAVENOUS | Status: AC
  Administered 2018-03-01: 567 mg via INTRAVENOUS
  Filled 2018-03-01: qty 27

## 2018-03-01 MED ORDER — HEPARIN SOD (PORK) LOCK FLUSH 100 UNIT/ML IV SOLN
500.0000 [IU] | Freq: Once | INTRAVENOUS | Status: AC | PRN
  Administered 2018-03-01: 500 [IU]
  Filled 2018-03-01: qty 5

## 2018-03-01 MED ORDER — SODIUM CHLORIDE 0.9 % IV SOLN
Freq: Once | INTRAVENOUS | Status: AC
  Administered 2018-03-01: 10:00:00 via INTRAVENOUS
  Filled 2018-03-01: qty 250

## 2018-03-01 NOTE — Progress Notes (Signed)
Okay for treatment of Herceptin today with Echo results from 11/2017 per Dr. Lindi Adie.  Orders placed for Echo appointment.

## 2018-03-01 NOTE — Patient Instructions (Signed)
[facility] Discharge Instructions for Patients Receiving Chemotherapy  Today you received the following chemotherapy agents Herceptin  To help prevent nausea and vomiting after your treatment, we encourage you to take your nausea medication as directed   If you develop nausea and vomiting that is not controlled by your nausea medication, call the clinic.   BELOW ARE SYMPTOMS THAT SHOULD BE REPORTED IMMEDIATELY:  *FEVER GREATER THAN 100.5 F  *CHILLS WITH OR WITHOUT FEVER  NAUSEA AND VOMITING THAT IS NOT CONTROLLED WITH YOUR NAUSEA MEDICATION  *UNUSUAL SHORTNESS OF BREATH  *UNUSUAL BRUISING OR BLEEDING  TENDERNESS IN MOUTH AND THROAT WITH OR WITHOUT PRESENCE OF ULCERS  *URINARY PROBLEMS  *BOWEL PROBLEMS  UNUSUAL RASH Items with * indicate a potential emergency and should be followed up as soon as possible.  Feel free to call the clinic should you have any questions or concerns. The clinic phone number is [phone].  Please show the CHEMO ALERT CARD at check-in to the Emergency Department and triage nurse.   

## 2018-03-01 NOTE — Progress Notes (Signed)
Patient made nurse aware of black stools this past week and endoscopy on 02/26/18. Patient does not report any black stools at this time and is asymptomatic and well feeling. Lindi Adie, MD made aware.

## 2018-03-02 ENCOUNTER — Telehealth (INDEPENDENT_AMBULATORY_CARE_PROVIDER_SITE_OTHER): Payer: Self-pay | Admitting: Internal Medicine

## 2018-03-02 DIAGNOSIS — C50412 Malignant neoplasm of upper-outer quadrant of left female breast: Secondary | ICD-10-CM | POA: Diagnosis not present

## 2018-03-02 NOTE — Telephone Encounter (Signed)
Patient left message stating the nexium she is taking is making her ankles swell  - she will stop taking

## 2018-03-03 ENCOUNTER — Encounter (HOSPITAL_COMMUNITY): Payer: Self-pay | Admitting: Internal Medicine

## 2018-03-03 DIAGNOSIS — C50412 Malignant neoplasm of upper-outer quadrant of left female breast: Secondary | ICD-10-CM | POA: Diagnosis not present

## 2018-03-04 DIAGNOSIS — C50412 Malignant neoplasm of upper-outer quadrant of left female breast: Secondary | ICD-10-CM | POA: Diagnosis not present

## 2018-03-05 DIAGNOSIS — C50412 Malignant neoplasm of upper-outer quadrant of left female breast: Secondary | ICD-10-CM | POA: Diagnosis not present

## 2018-03-07 NOTE — Telephone Encounter (Signed)
She can try Pepcid OTC 20 mg twice daily. Please call patient

## 2018-03-08 DIAGNOSIS — C50412 Malignant neoplasm of upper-outer quadrant of left female breast: Secondary | ICD-10-CM | POA: Diagnosis not present

## 2018-03-08 NOTE — Telephone Encounter (Signed)
Patient was called and a message was left on her cell phone , then called her home and gave these recommendations to her husband.

## 2018-03-09 DIAGNOSIS — C50412 Malignant neoplasm of upper-outer quadrant of left female breast: Secondary | ICD-10-CM | POA: Diagnosis not present

## 2018-03-10 DIAGNOSIS — C50412 Malignant neoplasm of upper-outer quadrant of left female breast: Secondary | ICD-10-CM | POA: Diagnosis not present

## 2018-03-11 DIAGNOSIS — C50412 Malignant neoplasm of upper-outer quadrant of left female breast: Secondary | ICD-10-CM | POA: Diagnosis not present

## 2018-03-12 DIAGNOSIS — C50412 Malignant neoplasm of upper-outer quadrant of left female breast: Secondary | ICD-10-CM | POA: Diagnosis not present

## 2018-03-15 DIAGNOSIS — C50412 Malignant neoplasm of upper-outer quadrant of left female breast: Secondary | ICD-10-CM | POA: Diagnosis not present

## 2018-03-16 DIAGNOSIS — C50412 Malignant neoplasm of upper-outer quadrant of left female breast: Secondary | ICD-10-CM | POA: Diagnosis not present

## 2018-03-17 DIAGNOSIS — C50412 Malignant neoplasm of upper-outer quadrant of left female breast: Secondary | ICD-10-CM | POA: Diagnosis not present

## 2018-03-19 ENCOUNTER — Other Ambulatory Visit: Payer: Self-pay

## 2018-03-19 DIAGNOSIS — C50412 Malignant neoplasm of upper-outer quadrant of left female breast: Secondary | ICD-10-CM

## 2018-03-19 DIAGNOSIS — Z170 Estrogen receptor positive status [ER+]: Principal | ICD-10-CM

## 2018-03-22 ENCOUNTER — Inpatient Hospital Stay: Payer: Medicare Other | Attending: Hematology and Oncology

## 2018-03-22 ENCOUNTER — Inpatient Hospital Stay: Payer: Medicare Other

## 2018-03-22 ENCOUNTER — Inpatient Hospital Stay (HOSPITAL_BASED_OUTPATIENT_CLINIC_OR_DEPARTMENT_OTHER): Payer: Medicare Other | Admitting: Hematology and Oncology

## 2018-03-22 VITALS — BP 153/71

## 2018-03-22 DIAGNOSIS — Z79899 Other long term (current) drug therapy: Secondary | ICD-10-CM

## 2018-03-22 DIAGNOSIS — Z170 Estrogen receptor positive status [ER+]: Secondary | ICD-10-CM

## 2018-03-22 DIAGNOSIS — C50412 Malignant neoplasm of upper-outer quadrant of left female breast: Secondary | ICD-10-CM | POA: Insufficient documentation

## 2018-03-22 DIAGNOSIS — G620 Drug-induced polyneuropathy: Secondary | ICD-10-CM | POA: Diagnosis not present

## 2018-03-22 DIAGNOSIS — Z95828 Presence of other vascular implants and grafts: Secondary | ICD-10-CM

## 2018-03-22 DIAGNOSIS — K449 Diaphragmatic hernia without obstruction or gangrene: Secondary | ICD-10-CM

## 2018-03-22 DIAGNOSIS — R413 Other amnesia: Secondary | ICD-10-CM | POA: Diagnosis not present

## 2018-03-22 DIAGNOSIS — D649 Anemia, unspecified: Secondary | ICD-10-CM | POA: Diagnosis not present

## 2018-03-22 DIAGNOSIS — Z5112 Encounter for antineoplastic immunotherapy: Secondary | ICD-10-CM | POA: Diagnosis not present

## 2018-03-22 LAB — COMPREHENSIVE METABOLIC PANEL
ALT: 17 U/L (ref 0–44)
AST: 17 U/L (ref 15–41)
Albumin: 4.1 g/dL (ref 3.5–5.0)
Alkaline Phosphatase: 84 U/L (ref 38–126)
Anion gap: 10 (ref 5–15)
BUN: 20 mg/dL (ref 8–23)
CO2: 29 mmol/L (ref 22–32)
Calcium: 9.2 mg/dL (ref 8.9–10.3)
Chloride: 105 mmol/L (ref 98–111)
Creatinine, Ser: 0.78 mg/dL (ref 0.44–1.00)
GFR calc Af Amer: >60 mL/min (ref >60)
GFR calc non Af Amer: >60 mL/min (ref >60)
Glucose, Bld: 98 mg/dL (ref 70–99)
Potassium: 3.6 mmol/L (ref 3.5–5.1)
SODIUM: 144 mmol/L (ref 135–145)
Total Bilirubin: 0.6 mg/dL (ref 0.3–1.2)
Total Protein: 6.9 g/dL (ref 6.5–8.1)

## 2018-03-22 LAB — CBC WITH DIFFERENTIAL (CANCER CENTER ONLY)
Abs Immature Granulocytes: 0.01 10*3/uL (ref 0.00–0.07)
Basophils Absolute: 0 10*3/uL (ref 0.0–0.1)
Basophils Relative: 0 %
EOS PCT: 4 %
Eosinophils Absolute: 0.2 10*3/uL (ref 0.0–0.5)
HCT: 39.4 % (ref 36.0–46.0)
Hemoglobin: 12.5 g/dL (ref 12.0–15.0)
Immature Granulocytes: 0 %
Lymphocytes Relative: 16 %
Lymphs Abs: 0.8 10*3/uL (ref 0.7–4.0)
MCH: 30.5 pg (ref 26.0–34.0)
MCHC: 31.7 g/dL (ref 30.0–36.0)
MCV: 96.1 fL (ref 80.0–100.0)
MONO ABS: 0.7 10*3/uL (ref 0.1–1.0)
Monocytes Relative: 14 %
NEUTROS ABS: 3.1 10*3/uL (ref 1.7–7.7)
Neutrophils Relative %: 66 %
Platelet Count: 157 10*3/uL (ref 150–400)
RBC: 4.1 MIL/uL (ref 3.87–5.11)
RDW: 12.6 % (ref 11.5–15.5)
WBC Count: 4.8 10*3/uL (ref 4.0–10.5)
nRBC: 0 % (ref 0.0–0.2)

## 2018-03-22 MED ORDER — SODIUM CHLORIDE 0.9% FLUSH
10.0000 mL | Freq: Once | INTRAVENOUS | Status: AC
  Administered 2018-03-22: 10 mL
  Filled 2018-03-22: qty 10

## 2018-03-22 MED ORDER — ACETAMINOPHEN 325 MG PO TABS
650.0000 mg | ORAL_TABLET | Freq: Once | ORAL | Status: AC
  Administered 2018-03-22: 650 mg via ORAL

## 2018-03-22 MED ORDER — ACETAMINOPHEN 325 MG PO TABS
ORAL_TABLET | ORAL | Status: AC
  Filled 2018-03-22: qty 2

## 2018-03-22 MED ORDER — HEPARIN SOD (PORK) LOCK FLUSH 100 UNIT/ML IV SOLN
500.0000 [IU] | Freq: Once | INTRAVENOUS | Status: AC | PRN
  Administered 2018-03-22: 500 [IU]
  Filled 2018-03-22: qty 5

## 2018-03-22 MED ORDER — DIPHENHYDRAMINE HCL 25 MG PO CAPS
50.0000 mg | ORAL_CAPSULE | Freq: Once | ORAL | Status: AC
  Administered 2018-03-22: 50 mg via ORAL

## 2018-03-22 MED ORDER — TRASTUZUMAB CHEMO 150 MG IV SOLR
6.0000 mg/kg | Freq: Once | INTRAVENOUS | Status: AC
  Administered 2018-03-22: 567 mg via INTRAVENOUS
  Filled 2018-03-22: qty 27

## 2018-03-22 MED ORDER — DIPHENHYDRAMINE HCL 25 MG PO CAPS
ORAL_CAPSULE | ORAL | Status: AC
  Filled 2018-03-22: qty 2

## 2018-03-22 MED ORDER — SODIUM CHLORIDE 0.9 % IV SOLN
Freq: Once | INTRAVENOUS | Status: AC
  Administered 2018-03-22: 11:00:00 via INTRAVENOUS
  Filled 2018-03-22: qty 250

## 2018-03-22 MED ORDER — FAMOTIDINE 20 MG PO TABS: 20.0000 mg | ORAL_TABLET | Freq: Two times a day (BID) | ORAL | Status: DC

## 2018-03-22 MED ORDER — SODIUM CHLORIDE 0.9% FLUSH
10.0000 mL | INTRAVENOUS | Status: DC | PRN
  Administered 2018-03-22: 10 mL
  Filled 2018-03-22: qty 10

## 2018-03-22 NOTE — Progress Notes (Signed)
Okay to treat with Echo from 11/18/2017 per Dr. Lindi Adie - Echo scheduled for 03/31/2018

## 2018-03-22 NOTE — Patient Instructions (Signed)
[facility] Discharge Instructions for Patients Receiving Chemotherapy  Today you received the following chemotherapy agents: Trastuzmab (Herceptin)  To help prevent nausea and vomiting after your treatment, we encourage you to take your nausea medication as directed.    If you develop nausea and vomiting that is not controlled by your nausea medication, call the clinic.   BELOW ARE SYMPTOMS THAT SHOULD BE REPORTED IMMEDIATELY:  *FEVER GREATER THAN 100.5 F  *CHILLS WITH OR WITHOUT FEVER  NAUSEA AND VOMITING THAT IS NOT CONTROLLED WITH YOUR NAUSEA MEDICATION  *UNUSUAL SHORTNESS OF BREATH  *UNUSUAL BRUISING OR BLEEDING  TENDERNESS IN MOUTH AND THROAT WITH OR WITHOUT PRESENCE OF ULCERS  *URINARY PROBLEMS  *BOWEL PROBLEMS  UNUSUAL RASH Items with * indicate a potential emergency and should be followed up as soon as possible.  Feel free to call the clinic should you have any questions or concerns. The clinic phone number is [phone].  Please show the [place] at check-in to the Emergency Department and triage nurse.

## 2018-03-22 NOTE — Progress Notes (Signed)
Patient Care Team: Manon Hilding, MD as PCP - General (Unknown Physician Specialty) Excell Seltzer, MD as Consulting Physician (General Surgery) Nicholas Lose, MD as Consulting Physician (Hematology and Oncology) Gery Pray, MD as Consulting Physician (Radiation Oncology)  DIAGNOSIS:  Encounter Diagnosis  Name Primary?  . Malignant neoplasm of upper-outer quadrant of left breast in female, estrogen receptor positive ([place])     SUMMARY OF ONCOLOGIC HISTORY:   Malignant neoplasm of upper-outer quadrant of left breast in female, estrogen receptor positive ([place])   08/05/2017 Initial Diagnosis    Screening detected left breast calcifications UOQ 1.1 cm biopsy revealed invasive lobular cancer with LCIS and CSL, grade 2, ER 90%, PR 0%, Ki-67 1%, HER-2 positive ratio 2.62, T2N0 stage Ia clinical stage AJCC 8    08/12/2017 Cancer Staging    Staging form: Breast, AJCC 8th Edition - Clinical stage from 08/12/2017: Stage IA (cT1c, cN0, cM0, G2, ER+, PR-, HER2+) - Signed by Nicholas Lose, MD on 08/12/2017    09/11/2017 Surgery    Left lumpectomy: Grade 2 invasive lobular cancer, 1.1 cm, LCIS, margins negative, 0/2 lymph nodes negative, ER 90% strong staining, PR negative, HER-2 positive ratio 2.62, Ki-67 1%, T1c N0 stage Ia    09/23/2017 Cancer Staging    Staging form: Breast, AJCC 8th Edition - Pathologic: Stage IA (pT1c, pN0, cM0, G2, ER+, PR-, HER2+) - Signed by Gardenia Phlegm, NP on 09/23/2017    11/02/2017 -  Chemotherapy    Adjuvant therapy with Taxol Herceptin weekly x12 followed by Herceptin maintenance for 1 year     CHIEF COMPLIANT: Follow-up on Herceptin  INTERVAL HISTORY: Sylvia Barnett is a 1-year with above-mentioned history of adjuvant TAC Herceptin treatment.  She is tolerating it extremely well.  She will finish radiation therapy tomorrow.  She had gastroenterology evaluation for blood in the stool and upper endoscopy showed a hiatal hernia.  And she is not  due for a colonoscopy yet and is awaiting the results from today's hemoglobin to decide whether she needs an urgent colonoscopy.  Her anemia appears to be very stable and today it is 12.5.  REVIEW OF SYSTEMS:   Constitutional: Denies fevers, chills or abnormal weight loss Eyes: Denies blurriness of vision Ears, nose, mouth, throat, and face: Denies mucositis or sore throat Respiratory: Denies cough, dyspnea or wheezes Cardiovascular: Denies palpitation, chest discomfort Gastrointestinal: Blood in the stool Skin: Denies abnormal skin rashes Lymphatics: Denies new lymphadenopathy or easy bruising Neurological:Denies numbness, tingling or new weaknesses Behavioral/Psych: Mood is stable, no new changes  Extremities: No lower extremity edema  All other systems were reviewed with the patient and are negative.  I have reviewed the past medical history, past surgical history, social history and family history with the patient and they are unchanged from previous note.  ALLERGIES:  is allergic to fiorinal-codeine #3 [butalbital-asa-caff-codeine]; lansoprazole; latex; butalbital-aspirin-caffeine; sulfa antibiotics; femara [letrozole]; and sulfasalazine.  MEDICATIONS:  Current Outpatient Medications  Medication Sig Dispense Refill  . acetaminophen (TYLENOL) 325 MG tablet Take 325 mg by mouth every 6 (six) hours as needed.    Marland Kitchen atorvastatin (LIPITOR) 10 MG tablet Take 10 mg by mouth at bedtime.    . cyclobenzaprine (FLEXERIL) 10 MG tablet Take 10 mg by mouth 3 (three) times daily as needed for muscle spasms.     Marland Kitchen dicyclomine (BENTYL) 10 MG capsule Take 1 capsule (10 mg total) by mouth 3 (three) times daily as needed for spasms. Patient states that she started this 3 weeks  ago (Patient taking differently: Take 10 mg by mouth 2 (two) times daily before a meal. ) 270 capsule 1  . escitalopram (LEXAPRO) 5 MG tablet Take 5 mg by mouth at bedtime.     Marland Kitchen esomeprazole (NEXIUM) 20 MG capsule Take 1 capsule  (20 mg total) by mouth daily before breakfast. 8 capsule 0  . hydrochlorothiazide (HYDRODIURIL) 25 MG tablet Take 25 mg by mouth daily.    . hydroxypropyl methylcellulose / hypromellose (ISOPTO TEARS / GONIOVISC) 2.5 % ophthalmic solution Place 1 drop into both eyes 3 (three) times daily as needed for dry eyes.    Marland Kitchen loperamide (IMODIUM) 2 MG capsule Take 2 mg by mouth 2 (two) times daily.     . meloxicam (MOBIC) 7.5 MG tablet Take 7.5 mg by mouth 2 (two) times daily as needed for pain.     . Menthol, Topical Analgesic, (ICY HOT) 5 % PADS Apply 1 patch topically daily as needed (pain).     . Trastuzumab (HERCEPTIN IV) Inject into the vein. Patient will need to take until October 2020     No current facility-administered medications for this visit.     PHYSICAL EXAMINATION: ECOG PERFORMANCE STATUS: 1 - Symptomatic but completely ambulatory  Vitals:   03/22/18 0943  BP: (!) 144/108  Pulse: 69  Resp: 17  Temp: 97.7 F (36.5 C)  SpO2: 95%   Filed Weights   03/22/18 0943  Weight: 211 lb 8 oz (95.9 kg)    GENERAL:alert, no distress and comfortable SKIN: skin color, texture, turgor are normal, no rashes or significant lesions EYES: normal, Conjunctiva are pink and non-injected, sclera clear OROPHARYNX:no exudate, no erythema and lips, buccal mucosa, and tongue normal  NECK: supple, thyroid normal size, non-tender, without nodularity LYMPH:  no palpable lymphadenopathy in the cervical, axillary or inguinal LUNGS: clear to auscultation and percussion with normal breathing effort HEART: regular rate & rhythm and no murmurs and no lower extremity edema ABDOMEN:abdomen soft, non-tender and normal bowel sounds MUSCULOSKELETAL:no cyanosis of digits and no clubbing  NEURO: alert & oriented x 3 with fluent speech, no focal motor/sensory deficits EXTREMITIES: No lower extremity edema   LABORATORY DATA:  I have reviewed the data as listed CMP Latest Ref Rng & Units 02/08/2018 01/18/2018  01/11/2018  Glucose 70 - 99 mg/dL 92 133(H) 93  BUN 8 - 23 mg/dL _0 Creatinine 0.44 - 1.00 mg/dL 0.73 0.91 0.76  Sodium 135 - 145 mmol/L 145 144 144  Potassium 3.5 - 5.1 mmol/L 3.9 3.7 3.8  Chloride 98 - 111 mmol/L 108 109 108  CO2 22 - 32 mmol/L _1 Calcium 8.9 - 10.3 mg/dL 9.2 9.0 9.0  Total Protein 6.5 - 8.1 g/dL 6.6 6.1(L) 5.9(L)  Total Bilirubin 0.3 - 1.2 mg/dL 0.5 0.5 0.4  Alkaline Phos 38 - 126 U/L 83 75 63  AST 15 - 41 U/L 15 12(L) 12(L)  ALT 0 - 44 U/L _2 Lab Results  Component Value Date   WBC 4.8 03/22/2018   HGB 12.5 03/22/2018   HCT 39.4 03/22/2018   MCV 96.1 03/22/2018   PLT 157 03/22/2018   NEUTROABS 3.1 03/22/2018    ASSESSMENT & PLAN:  Malignant neoplasm of upper-outer quadrant of left breast in female, estrogen receptor positive ([facility]) 09/11/2017:Left lumpectomy: Grade 2 invasive lobular cancer, 1.1 cm, LCIS, margins negative, 0/2 lymph nodes negative, ER 90% strong staining, PR negative, HER-2 positive ratio 2.62, Ki-67 1%, T1c N0  stage Ia  Recommendation: 1.Adjuvant therapy with Taxol Herceptin weekly x12 followed by Herceptin maintenance for 1 year 2.Adjuvant radiation 3.Followed by adjuvant antiestrogen therapy ------------------------------------------------------------------ Current treatment: Herceptin maintenance therapy every 3 weeks Adjuvant radiation therapy at [facility]  will be completed tomorrow 03/23/2018 Chemo-induced peripheral neuropathy: Chemotherapy-induced memory loss: We talked about remember study.  She may do this once treatment is complete  Recent episodes of blood in the stool: She is following with gastroenterology.  Her hemoglobin today is 12.5.  She had upper endoscopy which showed a hiatal hernia.  Return to clinic every 3 weeks for Herceptin every 6 weeks for follow-up with me with labs.  No orders of the defined types were placed in this encounter.  The patient has a good understanding of the overall  plan. she agrees with it. she will call with any problems that may develop before the next visit here.   Harriette Ohara, MD 03/22/18

## 2018-03-22 NOTE — Assessment & Plan Note (Signed)
09/11/2017:Left lumpectomy: Grade 2 invasive lobular cancer, 1.1 cm, LCIS, margins negative, 0/2 lymph nodes negative, ER 90% strong staining, PR negative, HER-2 positive ratio 2.62, Ki-67 1%, T1c N0 stage Ia  Recommendation: 1.Adjuvant therapy with Taxol Herceptin weekly x12 followed by Herceptin maintenance for 1 year 2.Adjuvant radiation 3.Followed by adjuvant antiestrogen therapy ------------------------------------------------------------------ Current treatment: Herceptin maintenance therapy every 3 weeks Adjuvant radiation therapy at [facility] will start in February Chemo-induced peripheral neuropathy:  Return to clinic every 3 weeks for Herceptin every 6 weeks for follow-up with me with labs.

## 2018-03-23 DIAGNOSIS — C50412 Malignant neoplasm of upper-outer quadrant of left female breast: Secondary | ICD-10-CM | POA: Diagnosis not present

## 2018-03-26 ENCOUNTER — Other Ambulatory Visit (HOSPITAL_COMMUNITY)
Admission: RE | Admit: 2018-03-26 | Discharge: 2018-03-26 | Disposition: A | Discharge status: Home / Self Care | Payer: Medicare Other | Source: Ambulatory Visit | Attending: Urology | Admitting: Urology

## 2018-03-26 ENCOUNTER — Ambulatory Visit (INDEPENDENT_AMBULATORY_CARE_PROVIDER_SITE_OTHER): Payer: Medicare Other | Admitting: Urology

## 2018-03-26 DIAGNOSIS — N135 Crossing vessel and stricture of ureter without hydronephrosis: Secondary | ICD-10-CM

## 2018-03-26 DIAGNOSIS — N281 Cyst of kidney, acquired: Secondary | ICD-10-CM | POA: Diagnosis not present

## 2018-03-26 DIAGNOSIS — N3020 Other chronic cystitis without hematuria: Secondary | ICD-10-CM | POA: Insufficient documentation

## 2018-03-26 DIAGNOSIS — N130 Hydronephrosis with ureteropelvic junction obstruction: Secondary | ICD-10-CM | POA: Diagnosis not present

## 2018-03-26 LAB — URINALYSIS, COMPLETE (UACMP) WITH MICROSCOPIC
Bilirubin Urine: NEGATIVE
Glucose, UA: NEGATIVE mg/dL
Ketones, ur: NEGATIVE mg/dL
Nitrite: POSITIVE — AB
Protein, ur: NEGATIVE mg/dL
Specific Gravity, Urine: 1.018 (ref 1.005–1.030)
WBC, UA: >50 WBC/hpf — ABNORMAL HIGH (ref 0–5)
pH: 5 (ref 5.0–8.0)

## 2018-03-28 LAB — URINE CULTURE: Culture: >=100000 — AB

## 2018-03-30 ENCOUNTER — Other Ambulatory Visit: Payer: Self-pay | Admitting: Urology

## 2018-03-30 ENCOUNTER — Other Ambulatory Visit (HOSPITAL_COMMUNITY): Payer: Self-pay | Admitting: Urology

## 2018-03-30 DIAGNOSIS — N281 Cyst of kidney, acquired: Secondary | ICD-10-CM

## 2018-03-30 DIAGNOSIS — N130 Hydronephrosis with ureteropelvic junction obstruction: Secondary | ICD-10-CM

## 2018-03-31 ENCOUNTER — Ambulatory Visit (HOSPITAL_COMMUNITY)
Admission: RE | Admit: 2018-03-31 | Discharge: 2018-03-31 | Disposition: A | Discharge status: Home / Self Care | Payer: Medicare Other | Source: Ambulatory Visit | Attending: Hematology and Oncology | Admitting: Hematology and Oncology

## 2018-03-31 ENCOUNTER — Other Ambulatory Visit: Payer: Self-pay

## 2018-03-31 DIAGNOSIS — I350 Nonrheumatic aortic (valve) stenosis: Secondary | ICD-10-CM | POA: Diagnosis not present

## 2018-03-31 DIAGNOSIS — Z170 Estrogen receptor positive status [ER+]: Secondary | ICD-10-CM | POA: Diagnosis not present

## 2018-03-31 DIAGNOSIS — E785 Hyperlipidemia, unspecified: Secondary | ICD-10-CM | POA: Insufficient documentation

## 2018-03-31 DIAGNOSIS — I10 Essential (primary) hypertension: Secondary | ICD-10-CM | POA: Diagnosis not present

## 2018-03-31 DIAGNOSIS — C50412 Malignant neoplasm of upper-outer quadrant of left female breast: Secondary | ICD-10-CM | POA: Diagnosis not present

## 2018-03-31 NOTE — Progress Notes (Signed)
  Echocardiogram 2D Echocardiogram has been performed.  Sylvia Barnett M 03/31/2018, 10:24 AM

## 2018-04-08 DIAGNOSIS — R8271 Bacteriuria: Secondary | ICD-10-CM | POA: Diagnosis not present

## 2018-04-08 DIAGNOSIS — N1330 Unspecified hydronephrosis: Secondary | ICD-10-CM | POA: Diagnosis not present

## 2018-04-08 DIAGNOSIS — N130 Hydronephrosis with ureteropelvic junction obstruction: Secondary | ICD-10-CM | POA: Diagnosis not present

## 2018-04-08 DIAGNOSIS — N281 Cyst of kidney, acquired: Secondary | ICD-10-CM | POA: Diagnosis not present

## 2018-04-12 ENCOUNTER — Inpatient Hospital Stay: Payer: Medicare Other

## 2018-04-12 ENCOUNTER — Other Ambulatory Visit: Payer: Self-pay

## 2018-04-12 VITALS — BP 155/63 | HR 61 | Temp 97.9°F | Resp 18

## 2018-04-12 DIAGNOSIS — C50412 Malignant neoplasm of upper-outer quadrant of left female breast: Secondary | ICD-10-CM | POA: Diagnosis not present

## 2018-04-12 DIAGNOSIS — Z170 Estrogen receptor positive status [ER+]: Principal | ICD-10-CM

## 2018-04-12 DIAGNOSIS — Z5112 Encounter for antineoplastic immunotherapy: Secondary | ICD-10-CM | POA: Diagnosis not present

## 2018-04-12 MED ORDER — SODIUM CHLORIDE 0.9 % IV SOLN
Freq: Once | INTRAVENOUS | Status: AC
  Administered 2018-04-12: 11:00:00 via INTRAVENOUS
  Filled 2018-04-12: qty 250

## 2018-04-12 MED ORDER — HEPARIN SOD (PORK) LOCK FLUSH 100 UNIT/ML IV SOLN
500.0000 [IU] | Freq: Once | INTRAVENOUS | Status: AC | PRN
  Administered 2018-04-12: 500 [IU]
  Filled 2018-04-12: qty 5

## 2018-04-12 MED ORDER — ACETAMINOPHEN 325 MG PO TABS
650.0000 mg | ORAL_TABLET | Freq: Once | ORAL | Status: AC
  Administered 2018-04-12: 650 mg via ORAL

## 2018-04-12 MED ORDER — TRASTUZUMAB CHEMO 150 MG IV SOLR
6.0000 mg/kg | Freq: Once | INTRAVENOUS | Status: AC
  Administered 2018-04-12: 567 mg via INTRAVENOUS
  Filled 2018-04-12: qty 27

## 2018-04-12 MED ORDER — ACETAMINOPHEN 325 MG PO TABS
ORAL_TABLET | ORAL | Status: AC
  Filled 2018-04-12: qty 2

## 2018-04-12 MED ORDER — SODIUM CHLORIDE 0.9% FLUSH
10.0000 mL | INTRAVENOUS | Status: DC | PRN
  Administered 2018-04-12: 10 mL
  Filled 2018-04-12: qty 10

## 2018-04-12 MED ORDER — DIPHENHYDRAMINE HCL 25 MG PO CAPS
50.0000 mg | ORAL_CAPSULE | Freq: Once | ORAL | Status: AC
  Administered 2018-04-12: 50 mg via ORAL

## 2018-04-12 MED ORDER — DIPHENHYDRAMINE HCL 25 MG PO CAPS
ORAL_CAPSULE | ORAL | Status: AC
  Filled 2018-04-12: qty 2

## 2018-04-12 NOTE — Patient Instructions (Signed)
Coronavirus (COVID-19) Are you at risk?  Are you at risk for the Coronavirus (COVID-19)?  To be considered HIGH RISK for Coronavirus (COVID-19), you have to meet the following criteria:  . Traveled to [place], [place], [place], [place] or [place]; or in the [place] to [place], [place], [place], or [place]; and have fever, cough, and shortness of breath within the last 2 weeks of travel OR . Been in close contact with a person diagnosed with COVID-19 within the last 2 weeks and have fever, cough, and shortness of breath . IF YOU DO NOT MEET THESE CRITERIA, YOU ARE CONSIDERED LOW RISK FOR COVID-19.  What to do if you are HIGH RISK for COVID-19?  . If you are having a medical emergency, call 911. . Seek medical care right away. Before you go to a doctor's office, urgent care or emergency department, call ahead and tell them about your recent travel, contact with someone diagnosed with COVID-19, and your symptoms. You should receive instructions from your physician's office regarding next steps of care.  . When you arrive at healthcare provider, tell the healthcare staff immediately you have returned from visiting [place], [place], [place], [place] or [place]; or traveled in the [place] to [place], [place], [place], or [place]; in the last two weeks or you have been in close contact with a person diagnosed with COVID-19 in the last 2 weeks.   . Tell the health care staff about your symptoms: fever, cough and shortness of breath. . After you have been seen by a medical provider, you will be either: o Tested for (COVID-19) and discharged home on quarantine except to seek medical care if symptoms worsen, and asked to  - Stay home and avoid contact with others until you get your results (4-5 days)  - Avoid travel on public transportation if possible (such as bus, train, or airplane) or o Sent to the Emergency Department by EMS for evaluation, COVID-19 testing, and possible  admission depending on your condition and test results.  What to do if you are LOW RISK for COVID-19?  Reduce your risk of any infection by using the same precautions used for avoiding the common cold or flu:  . Wash your hands often with soap and warm water for at least 20 seconds.  If soap and water are not readily available, use an alcohol-based hand sanitizer with at least 60% alcohol.  . If coughing or sneezing, cover your mouth and nose by coughing or sneezing into the elbow areas of your shirt or coat, into a tissue or into your sleeve (not your hands). . Avoid shaking hands with others and consider head nods or verbal greetings only. . Avoid touching your eyes, nose, or mouth with unwashed hands.  . Avoid close contact with people who are sick. . Avoid places or events with large numbers of people in one location, like concerts or sporting events. . Carefully consider travel plans you have or are making. . If you are planning any travel outside or inside the [place], visit the CDC's Travelers' Health webpage for the latest health notices. . If you have some symptoms but not all symptoms, continue to monitor at home and seek medical attention if your symptoms worsen. . If you are having a medical emergency, call 911.   ADDITIONAL HEALTHCARE OPTIONS FOR PATIENTS  [facility] / e-Visit: [email or web address]         [facility]: [phone]  On[facility]   Urgent Care: [facility]: [place] Discharge Instructions for Patients Receiving Chemotherapy  Today you received the following chemotherapy agents: Trastuzmab (Herceptin)  To help prevent nausea and vomiting after your treatment, we encourage you to take your nausea medication as directed.    If you develop nausea and vomiting that is not controlled by your nausea medication, call the  clinic.   BELOW ARE SYMPTOMS THAT SHOULD BE REPORTED IMMEDIATELY:  *FEVER GREATER THAN 100.5 F  *CHILLS WITH OR WITHOUT FEVER  NAUSEA AND VOMITING THAT IS NOT CONTROLLED WITH YOUR NAUSEA MEDICATION  *UNUSUAL SHORTNESS OF BREATH  *UNUSUAL BRUISING OR BLEEDING  TENDERNESS IN MOUTH AND THROAT WITH OR WITHOUT PRESENCE OF ULCERS  *URINARY PROBLEMS  *BOWEL PROBLEMS  UNUSUAL RASH Items with * indicate a potential emergency and should be followed up as soon as possible.  Feel free to call the clinic should you have any questions or concerns. The clinic phone number is [phone].  Please show the [place] at check-in to the Emergency Department and triage nurse.

## 2018-04-21 DIAGNOSIS — C50912 Malignant neoplasm of unspecified site of left female breast: Secondary | ICD-10-CM | POA: Diagnosis not present

## 2018-04-21 DIAGNOSIS — Z9012 Acquired absence of left breast and nipple: Secondary | ICD-10-CM | POA: Diagnosis not present

## 2018-04-29 ENCOUNTER — Other Ambulatory Visit: Payer: Self-pay

## 2018-04-29 DIAGNOSIS — C50412 Malignant neoplasm of upper-outer quadrant of left female breast: Secondary | ICD-10-CM

## 2018-04-29 DIAGNOSIS — Z170 Estrogen receptor positive status [ER+]: Principal | ICD-10-CM

## 2018-04-30 NOTE — Progress Notes (Signed)
Patient Care Team: Sasser, Silvestre Moment, MD as PCP - General (Unknown Physician Specialty) Excell Seltzer, MD as Consulting Physician (General Surgery) Nicholas Lose, MD as Consulting Physician (Hematology and Oncology) Gery Pray, MD as Consulting Physician (Radiation Oncology)  DIAGNOSIS:    ICD-10-CM   1. Malignant neoplasm of upper-outer quadrant of left breast in female, estrogen receptor positive ([place]) C50.412    Z17.0     SUMMARY OF ONCOLOGIC HISTORY:   Malignant neoplasm of upper-outer quadrant of left breast in female, estrogen receptor positive ([place])   08/05/2017 Initial Diagnosis    Screening detected left breast calcifications UOQ 1.1 cm biopsy revealed invasive lobular cancer with LCIS and CSL, grade 2, ER 90%, PR 0%, Ki-67 1%, HER-2 positive ratio 2.62, T2N0 stage Ia clinical stage AJCC 8    08/12/2017 Cancer Staging    Staging form: Breast, AJCC 8th Edition - Clinical stage from 08/12/2017: Stage IA (cT1c, cN0, cM0, G2, ER+, PR-, HER2+) - Signed by Nicholas Lose, MD on 08/12/2017    09/11/2017 Surgery    Left lumpectomy: Grade 2 invasive lobular cancer, 1.1 cm, LCIS, margins negative, 0/2 lymph nodes negative, ER 90% strong staining, PR negative, HER-2 positive ratio 2.62, Ki-67 1%, T1c N0 stage Ia    09/23/2017 Cancer Staging    Staging form: Breast, AJCC 8th Edition - Pathologic: Stage IA (pT1c, pN0, cM0, G2, ER+, PR-, HER2+) - Signed by Gardenia Phlegm, NP on 09/23/2017    11/02/2017 -  Chemotherapy    Adjuvant therapy with Taxol Herceptin weekly x12 followed by Herceptin maintenance for 1 year    02/25/2018 - 03/22/2018 Radiation Therapy    Adj RT in [place]     CHIEF COMPLIANT: Follow-up on Herceptin  INTERVAL HISTORY: Sylvia Barnett is a 73 y.o. with above-mentioned history of left breast cancer treated with lumpectomy followed by adjuvant chemotherapy with Taxol and Herceptin and adjuvant radiation. An ECHO from 03/31/18 showed an ejection fraction  >65%. She is a participant in the [employer] clinical trial. She is currently receiving Herceptin maintenance and presents to the clinic today for treatment.  She is also here to discuss the pros and cons of starting antiestrogen therapy.  REVIEW OF SYSTEMS:   Constitutional: Denies fevers, chills or abnormal weight loss Eyes: Denies blurriness of vision Ears, nose, mouth, throat, and face: Denies mucositis or sore throat Respiratory: Denies cough, dyspnea or wheezes Cardiovascular: Denies palpitation, chest discomfort Gastrointestinal: Denies nausea, heartburn or change in bowel habits Skin: Denies abnormal skin rashes Lymphatics: Denies new lymphadenopathy or easy bruising Neurological: Denies numbness, tingling or new weaknesses Behavioral/Psych: Mood is stable, no new changes  Extremities: No lower extremity edema Breast: denies any pain or lumps or nodules in either breasts All other systems were reviewed with the patient and are negative.  I have reviewed the past medical history, past surgical history, social history and family history with the patient and they are unchanged from previous note.  ALLERGIES:  is allergic to fiorinal-codeine #3 [butalbital-asa-caff-codeine]; lansoprazole; latex; butalbital-aspirin-caffeine; sulfa antibiotics; femara [letrozole]; and sulfasalazine.  MEDICATIONS:  Current Outpatient Medications  Medication Sig Dispense Refill  . acetaminophen (TYLENOL) 325 MG tablet Take 325 mg by mouth every 6 (six) hours as needed.    Marland Kitchen atorvastatin (LIPITOR) 10 MG tablet Take 10 mg by mouth at bedtime.    . cyclobenzaprine (FLEXERIL) 10 MG tablet Take 10 mg by mouth 3 (three) times daily as needed for muscle spasms.     Marland Kitchen dicyclomine (BENTYL) 10 MG  capsule Take 1 capsule (10 mg total) by mouth 3 (three) times daily as needed for spasms. Patient states that she started this 3 weeks ago (Patient taking differently: Take 10 mg by mouth 2 (two) times daily before a meal. )  270 capsule 1  . escitalopram (LEXAPRO) 5 MG tablet Take 5 mg by mouth at bedtime.     . famotidine (PEPCID) 20 MG tablet Take 1 tablet (20 mg total) by mouth 2 (two) times daily.    . hydrochlorothiazide (HYDRODIURIL) 25 MG tablet Take 25 mg by mouth daily.    . hydroxypropyl methylcellulose / hypromellose (ISOPTO TEARS / GONIOVISC) 2.5 % ophthalmic solution Place 1 drop into both eyes 3 (three) times daily as needed for dry eyes.    Marland Kitchen loperamide (IMODIUM) 2 MG capsule Take 2 mg by mouth 2 (two) times daily.     . Menthol, Topical Analgesic, (ICY HOT) 5 % PADS Apply 1 patch topically daily as needed (pain).     . Trastuzumab (HERCEPTIN IV) Inject into the vein. Patient will need to take until October 2020     No current facility-administered medications for this visit.     PHYSICAL EXAMINATION: ECOG PERFORMANCE STATUS: 1 - Symptomatic but completely ambulatory  There were no vitals filed for this visit. There were no vitals filed for this visit.  GENERAL: alert, no distress and comfortable SKIN: skin color, texture, turgor are normal, no rashes or significant lesions EYES: normal, Conjunctiva are pink and non-injected, sclera clear OROPHARYNX: no exudate, no erythema and lips, buccal mucosa, and tongue normal  NECK: supple, thyroid normal size, non-tender, without nodularity LYMPH: no palpable lymphadenopathy in the cervical, axillary or inguinal LUNGS: clear to auscultation and percussion with normal breathing effort HEART: regular rate & rhythm and no murmurs and no lower extremity edema ABDOMEN: abdomen soft, non-tender and normal bowel sounds MUSCULOSKELETAL: no cyanosis of digits and no clubbing  NEURO: alert & oriented x 3 with fluent speech, no focal motor/sensory deficits EXTREMITIES: No lower extremity edema  LABORATORY DATA:  I have reviewed the data as listed CMP Latest Ref Rng & Units 03/22/2018 02/08/2018 01/18/2018  Glucose 70 - 99 mg/dL 98 92 133(H)  BUN 8 - 23 mg/dL _0 Creatinine 0.44 - 1.00 mg/dL 0.78 0.73 0.91  Sodium 135 - 145 mmol/L 144 145 144  Potassium 3.5 - 5.1 mmol/L 3.6 3.9 3.7  Chloride 98 - 111 mmol/L 105 108 109  CO2 22 - 32 mmol/L _1 Calcium 8.9 - 10.3 mg/dL 9.2 9.2 9.0  Total Protein 6.5 - 8.1 g/dL 6.9 6.6 6.1(L)  Total Bilirubin 0.3 - 1.2 mg/dL 0.6 0.5 0.5  Alkaline Phos 38 - 126 U/L 84 83 75  AST 15 - 41 U/L 17 15 12(L)  ALT 0 - 44 U/L _2 Lab Results  Component Value Date   WBC 4.8 03/22/2018   HGB 12.5 03/22/2018   HCT 39.4 03/22/2018   MCV 96.1 03/22/2018   PLT 157 03/22/2018   NEUTROABS 3.1 03/22/2018    ASSESSMENT & PLAN:  Malignant neoplasm of upper-outer quadrant of left breast in female, estrogen receptor positive ([facility]) 09/11/2017:Left lumpectomy: Grade 2 invasive lobular cancer, 1.1 cm, LCIS, margins negative, 0/2 lymph nodes negative, ER 90% strong staining, PR negative, HER-2 positive ratio 2.62, Ki-67 1%, T1c N0 stage Ia  Recommendation: 1.Adjuvant therapy with Taxol Herceptin weekly x12 followed by Herceptin maintenance for 1 year 2.Adjuvant radiationcompleted 03/23/2018 3.Followed  by adjuvant antiestrogen therapy ------------------------------------------------------------------ Current treatment:Herceptin maintenance therapy every 3 weeks, starting antiestrogen therapy with anastrozole 1 mg daily 05/03/2018 Chemo-induced peripheral neuropathy: Stable to improving Chemotherapy-induced memory loss: We talked about remember study.  She may do this once treatment is complete  Anastrozole counseling:We discussed the risks and benefits of anti-estrogen therapy with aromatase inhibitors. These include but not limited to insomnia, hot flashes, mood changes, vaginal dryness, bone density loss, and weight gain. We strongly believe that the benefits far outweigh the risks. Patient understands these risks and consented to starting treatment. Planned treatment duration is 7 years.  Return to  clinic every 3 weeks for Herceptin every 6 weeks for follow-up with me with labs.    No orders of the defined types were placed in this encounter.  The patient has a good understanding of the overall plan. she agrees with it. she will call with any problems that may develop before the next visit here.  Nicholas Lose, MD 05/03/2018  Julious Oka Dorshimer am acting as scribe for Dr. Nicholas Lose.  I have reviewed the above documentation for accuracy and completeness, and I agree with the above.

## 2018-05-03 ENCOUNTER — Inpatient Hospital Stay: Payer: Medicare Other | Attending: Hematology and Oncology

## 2018-05-03 ENCOUNTER — Encounter: Payer: Self-pay | Admitting: *Deleted

## 2018-05-03 ENCOUNTER — Other Ambulatory Visit: Payer: Self-pay

## 2018-05-03 ENCOUNTER — Inpatient Hospital Stay: Payer: Medicare Other

## 2018-05-03 ENCOUNTER — Inpatient Hospital Stay (HOSPITAL_BASED_OUTPATIENT_CLINIC_OR_DEPARTMENT_OTHER): Payer: Medicare Other | Admitting: Hematology and Oncology

## 2018-05-03 DIAGNOSIS — G620 Drug-induced polyneuropathy: Secondary | ICD-10-CM

## 2018-05-03 DIAGNOSIS — Z5112 Encounter for antineoplastic immunotherapy: Secondary | ICD-10-CM | POA: Insufficient documentation

## 2018-05-03 DIAGNOSIS — Z923 Personal history of irradiation: Secondary | ICD-10-CM | POA: Diagnosis not present

## 2018-05-03 DIAGNOSIS — Z170 Estrogen receptor positive status [ER+]: Secondary | ICD-10-CM

## 2018-05-03 DIAGNOSIS — C50412 Malignant neoplasm of upper-outer quadrant of left female breast: Secondary | ICD-10-CM

## 2018-05-03 DIAGNOSIS — Z79899 Other long term (current) drug therapy: Secondary | ICD-10-CM

## 2018-05-03 DIAGNOSIS — R413 Other amnesia: Secondary | ICD-10-CM | POA: Diagnosis not present

## 2018-05-03 DIAGNOSIS — Z95828 Presence of other vascular implants and grafts: Secondary | ICD-10-CM

## 2018-05-03 LAB — CMP (CANCER CENTER ONLY)
ALT: 18 U/L (ref 0–44)
AST: 14 U/L — ABNORMAL LOW (ref 15–41)
Albumin: 3.5 g/dL (ref 3.5–5.0)
Alkaline Phosphatase: 88 U/L (ref 38–126)
Anion gap: 11 (ref 5–15)
BUN: 18 mg/dL (ref 8–23)
CO2: 25 mmol/L (ref 22–32)
Calcium: 8.9 mg/dL (ref 8.9–10.3)
Chloride: 107 mmol/L (ref 98–111)
Creatinine: 0.77 mg/dL (ref 0.44–1.00)
GFR, Est AFR Am: >60 mL/min (ref >60)
GFR, Estimated: >60 mL/min (ref >60)
Glucose, Bld: 107 mg/dL — ABNORMAL HIGH (ref 70–99)
Potassium: 3.6 mmol/L (ref 3.5–5.1)
Sodium: 143 mmol/L (ref 135–145)
Total Bilirubin: 0.4 mg/dL (ref 0.3–1.2)
Total Protein: 6.5 g/dL (ref 6.5–8.1)

## 2018-05-03 LAB — CBC WITH DIFFERENTIAL (CANCER CENTER ONLY)
Abs Immature Granulocytes: 0 10*3/uL (ref 0.00–0.07)
Basophils Absolute: 0 10*3/uL (ref 0.0–0.1)
Basophils Relative: 1 %
Eosinophils Absolute: 0.2 10*3/uL (ref 0.0–0.5)
Eosinophils Relative: 3 %
HCT: 39.3 % (ref 36.0–46.0)
Hemoglobin: 12.8 g/dL (ref 12.0–15.0)
Immature Granulocytes: 0 %
Lymphocytes Relative: 15 %
Lymphs Abs: 0.7 10*3/uL (ref 0.7–4.0)
MCH: 30.8 pg (ref 26.0–34.0)
MCHC: 32.6 g/dL (ref 30.0–36.0)
MCV: 94.5 fL (ref 80.0–100.0)
Monocytes Absolute: 0.5 10*3/uL (ref 0.1–1.0)
Monocytes Relative: 11 %
Neutro Abs: 3.1 10*3/uL (ref 1.7–7.7)
Neutrophils Relative %: 70 %
Platelet Count: 152 10*3/uL (ref 150–400)
RBC: 4.16 MIL/uL (ref 3.87–5.11)
RDW: 12.9 % (ref 11.5–15.5)
WBC Count: 4.4 10*3/uL (ref 4.0–10.5)
nRBC: 0 % (ref 0.0–0.2)

## 2018-05-03 MED ORDER — ACETAMINOPHEN 325 MG PO TABS
ORAL_TABLET | ORAL | Status: AC
  Filled 2018-05-03: qty 2

## 2018-05-03 MED ORDER — DIPHENHYDRAMINE HCL 25 MG PO CAPS
50.0000 mg | ORAL_CAPSULE | Freq: Once | ORAL | Status: AC
  Administered 2018-05-03: 50 mg via ORAL

## 2018-05-03 MED ORDER — HEPARIN SOD (PORK) LOCK FLUSH 100 UNIT/ML IV SOLN
500.0000 [IU] | Freq: Once | INTRAVENOUS | Status: AC | PRN
  Administered 2018-05-03: 500 [IU]
  Filled 2018-05-03: qty 5

## 2018-05-03 MED ORDER — TRASTUZUMAB CHEMO 150 MG IV SOLR
6.0000 mg/kg | Freq: Once | INTRAVENOUS | Status: AC
  Administered 2018-05-03: 567 mg via INTRAVENOUS
  Filled 2018-05-03: qty 27

## 2018-05-03 MED ORDER — SODIUM CHLORIDE 0.9% FLUSH
10.0000 mL | INTRAVENOUS | Status: DC | PRN
  Administered 2018-05-03: 10 mL
  Filled 2018-05-03: qty 10

## 2018-05-03 MED ORDER — SODIUM CHLORIDE 0.9 % IV SOLN
Freq: Once | INTRAVENOUS | Status: AC
  Administered 2018-05-03: 10:00:00 via INTRAVENOUS
  Filled 2018-05-03: qty 250

## 2018-05-03 MED ORDER — ACETAMINOPHEN 325 MG PO TABS
650.0000 mg | ORAL_TABLET | Freq: Once | ORAL | Status: AC
  Administered 2018-05-03: 650 mg via ORAL

## 2018-05-03 MED ORDER — DIPHENHYDRAMINE HCL 25 MG PO CAPS
ORAL_CAPSULE | ORAL | Status: AC
  Filled 2018-05-03: qty 2

## 2018-05-03 MED ORDER — SODIUM CHLORIDE 0.9% FLUSH
10.0000 mL | Freq: Once | INTRAVENOUS | Status: AC
  Administered 2018-05-03: 10 mL
  Filled 2018-05-03: qty 10

## 2018-05-03 MED ORDER — ANASTROZOLE 1 MG PO TABS: 1.0000 mg | ORAL_TABLET | Freq: Every day | ORAL | 3 refills | Status: DC

## 2018-05-03 NOTE — Assessment & Plan Note (Signed)
09/11/2017:Left lumpectomy: Grade 2 invasive lobular cancer, 1.1 cm, LCIS, margins negative, 0/2 lymph nodes negative, ER 90% strong staining, PR negative, HER-2 positive ratio 2.62, Ki-67 1%, T1c N0 stage Ia  Recommendation: 1.Adjuvant therapy with Taxol Herceptin weekly x12 followed by Herceptin maintenance for 1 year 2.Adjuvant radiationcompleted 03/23/2018 3.Followed by adjuvant antiestrogen therapy ------------------------------------------------------------------ Current treatment:Herceptin maintenance therapy every 3 weeks, starting antiestrogen therapy with anastrozole 1 mg daily 05/03/2018 Chemo-induced peripheral neuropathy: Stable to improving Chemotherapy-induced memory loss: We talked about remember study.  She may do this once treatment is complete  Anastrozole counseling:We discussed the risks and benefits of anti-estrogen therapy with aromatase inhibitors. These include but not limited to insomnia, hot flashes, mood changes, vaginal dryness, bone density loss, and weight gain. We strongly believe that the benefits far outweigh the risks. Patient understands these risks and consented to starting treatment. Planned treatment duration is 7 years.  Return to clinic every 3 weeks for Herceptin every 6 weeks for follow-up with me with labs.

## 2018-05-03 NOTE — Patient Instructions (Signed)
[facility] Discharge Instructions for Patients Receiving Chemotherapy Today you received the following chemotherapy agents:  Herceptin To help prevent nausea and vomiting after your treatment, we encourage you to take your nausea medication as prescribed.   If you develop nausea and vomiting that is not controlled by your nausea medication, call the clinic.   BELOW ARE SYMPTOMS THAT SHOULD BE REPORTED IMMEDIATELY:  *FEVER GREATER THAN 100.5 F  *CHILLS WITH OR WITHOUT FEVER  NAUSEA AND VOMITING THAT IS NOT CONTROLLED WITH YOUR NAUSEA MEDICATION  *UNUSUAL SHORTNESS OF BREATH  *UNUSUAL BRUISING OR BLEEDING  TENDERNESS IN MOUTH AND THROAT WITH OR WITHOUT PRESENCE OF ULCERS  *URINARY PROBLEMS  *BOWEL PROBLEMS  UNUSUAL RASH Items with * indicate a potential emergency and should be followed up as soon as possible.  Feel free to call the clinic should you have any questions or concerns. The clinic phone number is [phone].  Please show the CHEMO ALERT CARD at check-in to the Emergency Department and triage nurse.   

## 2018-05-05 ENCOUNTER — Encounter: Payer: Self-pay | Admitting: *Deleted

## 2018-05-05 ENCOUNTER — Telehealth: Payer: Self-pay | Admitting: Hematology and Oncology

## 2018-05-05 NOTE — Progress Notes (Unsigned)
Lab results mailed to pt per pt request

## 2018-05-05 NOTE — Telephone Encounter (Signed)
Added appts for 5/11 per sch msg. Scheduled other appts per 4/20 sch msg. Called patient, no answer. Left msg. Mailed printout.

## 2018-05-10 ENCOUNTER — Encounter: Payer: Self-pay | Admitting: *Deleted

## 2018-05-10 NOTE — Progress Notes (Signed)
Per pt request mailed schedule

## 2018-05-24 ENCOUNTER — Encounter: Payer: Self-pay | Admitting: *Deleted

## 2018-05-24 ENCOUNTER — Inpatient Hospital Stay: Payer: Medicare Other | Attending: Hematology and Oncology

## 2018-05-24 ENCOUNTER — Other Ambulatory Visit: Payer: Self-pay

## 2018-05-24 ENCOUNTER — Inpatient Hospital Stay: Payer: Medicare Other

## 2018-05-24 VITALS — BP 155/63 | HR 63 | Temp 98.0°F | Resp 18

## 2018-05-24 DIAGNOSIS — Z170 Estrogen receptor positive status [ER+]: Secondary | ICD-10-CM

## 2018-05-24 DIAGNOSIS — Z5112 Encounter for antineoplastic immunotherapy: Secondary | ICD-10-CM | POA: Insufficient documentation

## 2018-05-24 DIAGNOSIS — C50412 Malignant neoplasm of upper-outer quadrant of left female breast: Secondary | ICD-10-CM

## 2018-05-24 DIAGNOSIS — Z95828 Presence of other vascular implants and grafts: Secondary | ICD-10-CM

## 2018-05-24 MED ORDER — SODIUM CHLORIDE 0.9% FLUSH
10.0000 mL | INTRAVENOUS | Status: DC | PRN
  Administered 2018-05-24: 10 mL
  Filled 2018-05-24: qty 10

## 2018-05-24 MED ORDER — SODIUM CHLORIDE 0.9 % IV SOLN
Freq: Once | INTRAVENOUS | Status: AC
  Administered 2018-05-24: 10:00:00 via INTRAVENOUS
  Filled 2018-05-24: qty 250

## 2018-05-24 MED ORDER — TRASTUZUMAB CHEMO 150 MG IV SOLR
6.0000 mg/kg | Freq: Once | INTRAVENOUS | Status: AC
  Administered 2018-05-24: 567 mg via INTRAVENOUS
  Filled 2018-05-24: qty 27

## 2018-05-24 MED ORDER — HEPARIN SOD (PORK) LOCK FLUSH 100 UNIT/ML IV SOLN
500.0000 [IU] | Freq: Once | INTRAVENOUS | Status: AC | PRN
  Administered 2018-05-24: 500 [IU]
  Filled 2018-05-24: qty 5

## 2018-05-24 MED ORDER — DIPHENHYDRAMINE HCL 25 MG PO CAPS
ORAL_CAPSULE | ORAL | Status: AC
  Filled 2018-05-24: qty 2

## 2018-05-24 MED ORDER — ACETAMINOPHEN 325 MG PO TABS
ORAL_TABLET | ORAL | Status: AC
  Filled 2018-05-24: qty 2

## 2018-05-24 MED ORDER — SODIUM CHLORIDE 0.9% FLUSH
10.0000 mL | Freq: Once | INTRAVENOUS | Status: AC
  Administered 2018-05-24: 10:00:00 10 mL
  Filled 2018-05-24: qty 10

## 2018-05-24 MED ORDER — ACETAMINOPHEN 325 MG PO TABS
650.0000 mg | ORAL_TABLET | Freq: Once | ORAL | Status: AC
  Administered 2018-05-24: 650 mg via ORAL

## 2018-05-24 MED ORDER — DIPHENHYDRAMINE HCL 25 MG PO CAPS
50.0000 mg | ORAL_CAPSULE | Freq: Once | ORAL | Status: AC
  Administered 2018-05-24: 10:00:00 50 mg via ORAL

## 2018-05-24 NOTE — Patient Instructions (Signed)
[facility] Discharge Instructions for Patients Receiving Chemotherapy  Today you received the following chemotherapy agents herceptin   To help prevent nausea and vomiting after your treatment, we encourage you to take your nausea medication as directed   If you develop nausea and vomiting that is not controlled by your nausea medication, call the clinic.   BELOW ARE SYMPTOMS THAT SHOULD BE REPORTED IMMEDIATELY:  *FEVER GREATER THAN 100.5 F  *CHILLS WITH OR WITHOUT FEVER  NAUSEA AND VOMITING THAT IS NOT CONTROLLED WITH YOUR NAUSEA MEDICATION  *UNUSUAL SHORTNESS OF BREATH  *UNUSUAL BRUISING OR BLEEDING  TENDERNESS IN MOUTH AND THROAT WITH OR WITHOUT PRESENCE OF ULCERS  *URINARY PROBLEMS  *BOWEL PROBLEMS  UNUSUAL RASH Items with * indicate a potential emergency and should be followed up as soon as possible.  Feel free to call the clinic you have any questions or concerns. The clinic phone number is [phone].  

## 2018-05-24 NOTE — Research (Signed)
UPBEAT  05/24/18 11:47 AM  This RN met patient Sylvia Barnett following her routine infusion visit.  The patient has completed all visit requirements for her month 3 visit on the UPBEAT trial.  Her cMRI had to be rescheduled from her original date, but this has since been completed.   This RN provided the patient with her tote bag, magnet, and [employer] giftcard.  The patient was thankful for receipt.  She expressed that she was doing well.  This RN thanked the patient for her ongoing participation in the UPBEAT trial.  Doreatha Martin, RN, BSN, [facility] 05/24/2018 11:49 AM

## 2018-06-08 NOTE — Assessment & Plan Note (Signed)
09/11/2017:Left lumpectomy: Grade 2 invasive lobular cancer, 1.1 cm, LCIS, margins negative, 0/2 lymph nodes negative, ER 90% strong staining, PR negative, HER-2 positive ratio 2.62, Ki-67 1%, T1c N0 stage Ia  Recommendation: 1.Adjuvant therapy with Taxol Herceptin weekly x12 followed by Herceptin maintenance for 1 year 2.Adjuvant radiationcompleted 03/23/2018 3.Followed by adjuvant antiestrogen therapy ------------------------------------------------------------------ Current treatment:Herceptin maintenance therapy every 3 weeks to be completed October 2020, antiestrogen therapy with anastrozole 1 mg daily 05/03/2018 Chemo-induced peripheral neuropathy: Stable to improving Chemotherapy-induced memory loss: We talked about remember study. She may do this once treatment is complete  Anastrozole toxicities:  Return to clinic every 3 weeks for Herceptin every 6 weeks for follow-up with me with labs.

## 2018-06-11 ENCOUNTER — Ambulatory Visit (INDEPENDENT_AMBULATORY_CARE_PROVIDER_SITE_OTHER): Payer: Medicare Other | Admitting: Urology

## 2018-06-11 ENCOUNTER — Other Ambulatory Visit (HOSPITAL_COMMUNITY)
Admission: RE | Admit: 2018-06-11 | Discharge: 2018-06-11 | Disposition: A | Discharge status: Home / Self Care | Payer: Medicare Other | Source: Other Acute Inpatient Hospital | Attending: Urology | Admitting: Urology

## 2018-06-11 DIAGNOSIS — N3946 Mixed incontinence: Secondary | ICD-10-CM | POA: Diagnosis not present

## 2018-06-11 DIAGNOSIS — N281 Cyst of kidney, acquired: Secondary | ICD-10-CM | POA: Diagnosis not present

## 2018-06-11 DIAGNOSIS — N130 Hydronephrosis with ureteropelvic junction obstruction: Secondary | ICD-10-CM | POA: Diagnosis not present

## 2018-06-11 DIAGNOSIS — N3020 Other chronic cystitis without hematuria: Secondary | ICD-10-CM

## 2018-06-11 DIAGNOSIS — R911 Solitary pulmonary nodule: Secondary | ICD-10-CM

## 2018-06-11 LAB — URINALYSIS, COMPLETE (UACMP) WITH MICROSCOPIC
Bilirubin Urine: NEGATIVE
Glucose, UA: NEGATIVE mg/dL
Ketones, ur: NEGATIVE mg/dL
Nitrite: POSITIVE — AB
Specific Gravity, Urine: 1.025 (ref 1.005–1.030)
WBC, UA: >50 WBC/hpf (ref 0–5)
pH: 5.5 (ref 5.0–8.0)

## 2018-06-13 LAB — URINE CULTURE: Culture: >=100000 — AB

## 2018-06-13 NOTE — Progress Notes (Signed)
Patient Care Team: Sasser, Silvestre Moment, MD as PCP - General (Unknown Physician Specialty) Excell Seltzer, MD as Consulting Physician (General Surgery) Nicholas Lose, MD as Consulting Physician (Hematology and Oncology) Gery Pray, MD as Consulting Physician (Radiation Oncology)  DIAGNOSIS:    ICD-10-CM   1. Malignant neoplasm of upper-outer quadrant of left breast in female, estrogen receptor positive ([place]) C50.412    Z17.0     SUMMARY OF ONCOLOGIC HISTORY:   Malignant neoplasm of upper-outer quadrant of left breast in female, estrogen receptor positive ([place])   08/05/2017 Initial Diagnosis    Screening detected left breast calcifications UOQ 1.1 cm biopsy revealed invasive lobular cancer with LCIS and CSL, grade 2, ER 90%, PR 0%, Ki-67 1%, HER-2 positive ratio 2.62, T2N0 stage Ia clinical stage AJCC 8    08/12/2017 Cancer Staging    Staging form: Breast, AJCC 8th Edition - Clinical stage from 08/12/2017: Stage IA (cT1c, cN0, cM0, G2, ER+, PR-, HER2+) - Signed by Nicholas Lose, MD on 08/12/2017    09/11/2017 Surgery    Left lumpectomy: Grade 2 invasive lobular cancer, 1.1 cm, LCIS, margins negative, 0/2 lymph nodes negative, ER 90% strong staining, PR negative, HER-2 positive ratio 2.62, Ki-67 1%, T1c N0 stage Ia    09/23/2017 Cancer Staging    Staging form: Breast, AJCC 8th Edition - Pathologic: Stage IA (pT1c, pN0, cM0, G2, ER+, PR-, HER2+) - Signed by Gardenia Phlegm, NP on 09/23/2017    11/02/2017 -  Chemotherapy    Adjuvant therapy with Taxol Herceptin weekly x12 followed by Herceptin maintenance for 1 year    02/25/2018 - 03/22/2018 Radiation Therapy    Adj RT in [place]    05/03/2018 -  Anti-estrogen oral therapy    Anastrozole, 77m daily (plan for 7 years)     CHIEF COMPLIANT: Follow-up on Herceptin maintenance and anastrozole  INTERVAL HISTORY: Sylvia ALIAis a 73y.o. with above-mentioned history of left breast cancer treated with lumpectomy, adjuvant  chemotherapy with Taxol and Herceptin, and adjuvant radiation. She is a participant in the UpBeat clinical trial. She is currently on anti-estrogen therapy with anastrozole and is receiving Herceptin maintenance. She presents to the clinic today for treatment.   REVIEW OF SYSTEMS:   Constitutional: Denies fevers, chills or abnormal weight loss Eyes: Denies blurriness of vision Ears, nose, mouth, throat, and face: Denies mucositis or sore throat Respiratory: Denies cough, dyspnea or wheezes Cardiovascular: Denies palpitation, chest discomfort Gastrointestinal: Denies nausea, heartburn or change in bowel habits Skin: Denies abnormal skin rashes Lymphatics: Denies new lymphadenopathy or easy bruising Neurological: Denies numbness, tingling or new weaknesses Behavioral/Psych: Mood is stable, no new changes  Extremities: No lower extremity edema Breast: denies any pain or lumps or nodules in either breasts All other systems were reviewed with the patient and are negative.  I have reviewed the past medical history, past surgical history, social history and family history with the patient and they are unchanged from previous note.  ALLERGIES:  is allergic to fiorinal-codeine #3 [butalbital-asa-caff-codeine]; lansoprazole; latex; butalbital-aspirin-caffeine; sulfa antibiotics; femara [letrozole]; and sulfasalazine.  MEDICATIONS:  Current Outpatient Medications  Medication Sig Dispense Refill  . acetaminophen (TYLENOL) 325 MG tablet Take 325 mg by mouth every 6 (six) hours as needed.    .Marland Kitchenanastrozole (ARIMIDEX) 1 MG tablet Take 1 tablet (1 mg total) by mouth daily. 90 tablet 3  . atorvastatin (LIPITOR) 10 MG tablet Take 10 mg by mouth at bedtime.    . cyclobenzaprine (FLEXERIL) 10 MG tablet  Take 10 mg by mouth 3 (three) times daily as needed for muscle spasms.     Marland Kitchen dicyclomine (BENTYL) 10 MG capsule Take 1 capsule (10 mg total) by mouth 3 (three) times daily as needed for spasms. Patient states  that she started this 3 weeks ago (Patient taking differently: Take 10 mg by mouth 2 (two) times daily before a meal. ) 270 capsule 1  . escitalopram (LEXAPRO) 5 MG tablet Take 5 mg by mouth at bedtime.     . hydrochlorothiazide (HYDRODIURIL) 25 MG tablet Take 25 mg by mouth daily.    . hydroxypropyl methylcellulose / hypromellose (ISOPTO TEARS / GONIOVISC) 2.5 % ophthalmic solution Place 1 drop into both eyes 3 (three) times daily as needed for dry eyes.    Marland Kitchen loperamide (IMODIUM) 2 MG capsule Take 2 mg by mouth 2 (two) times daily.     . Menthol, Topical Analgesic, (ICY HOT) 5 % PADS Apply 1 patch topically daily as needed (pain).     . Trastuzumab (HERCEPTIN IV) Inject into the vein. Patient will need to take until October 2020     No current facility-administered medications for this visit.     PHYSICAL EXAMINATION: ECOG PERFORMANCE STATUS: 1 - Symptomatic but completely ambulatory  There were no vitals filed for this visit. There were no vitals filed for this visit.  GENERAL: alert, no distress and comfortable SKIN: skin color, texture, turgor are normal, no rashes or significant lesions EYES: normal, Conjunctiva are pink and non-injected, sclera clear OROPHARYNX: no exudate, no erythema and lips, buccal mucosa, and tongue normal  NECK: supple, thyroid normal size, non-tender, without nodularity LYMPH: no palpable lymphadenopathy in the cervical, axillary or inguinal LUNGS: clear to auscultation and percussion with normal breathing effort HEART: regular rate & rhythm and no murmurs and no lower extremity edema ABDOMEN: abdomen soft, non-tender and normal bowel sounds MUSCULOSKELETAL: no cyanosis of digits and no clubbing  NEURO: alert & oriented x 3 with fluent speech, no focal motor/sensory deficits EXTREMITIES: No lower extremity edema  LABORATORY DATA:  I have reviewed the data as listed CMP Latest Ref Rng & Units 05/03/2018 03/22/2018 02/08/2018  Glucose 70 - 99 mg/dL 107(H) 98  92  BUN 8 - 23 mg/dL 18 20 14   Creatinine 0.44 - 1.00 mg/dL 0.77 0.78 0.73  Sodium 135 - 145 mmol/L 143 144 145  Potassium 3.5 - 5.1 mmol/L 3.6 3.6 3.9  Chloride 98 - 111 mmol/L 107 105 108  CO2 22 - 32 mmol/L 25 29 29   Calcium 8.9 - 10.3 mg/dL 8.9 9.2 9.2  Total Protein 6.5 - 8.1 g/dL 6.5 6.9 6.6  Total Bilirubin 0.3 - 1.2 mg/dL 0.4 0.6 0.5  Alkaline Phos 38 - 126 U/L 88 84 83  AST 15 - 41 U/L 14(L) 17 15  ALT 0 - 44 U/L 18 17 18     Lab Results  Component Value Date   WBC 4.9 06/14/2018   HGB 12.2 06/14/2018   HCT 37.1 06/14/2018   MCV 94.2 06/14/2018   PLT 155 06/14/2018   NEUTROABS 3.5 06/14/2018    ASSESSMENT & PLAN:  Malignant neoplasm of upper-outer quadrant of left breast in female, estrogen receptor positive ([facility]) 09/11/2017:Left lumpectomy: Grade 2 invasive lobular cancer, 1.1 cm, LCIS, margins negative, 0/2 lymph nodes negative, ER 90% strong staining, PR negative, HER-2 positive ratio 2.62, Ki-67 1%, T1c N0 stage Ia  Recommendation: 1.Adjuvant therapy with Taxol Herceptin weekly x12 followed by Herceptin maintenance for 1 year 2.Adjuvant radiationcompleted  03/23/2018 3.Followed by adjuvant antiestrogen therapy started 05/03/2018 ------------------------------------------------------------------ Current treatment:Herceptin maintenance therapy every 3 weeks to be completed October 2020, antiestrogen therapy with anastrozole 1 mg daily 05/03/2018 Chemo-induced peripheral neuropathy: Stable to improving Chemotherapy-induced memory loss: We talked about remember study. She may do this once treatment is complete  Anastrozole toxicities: Complains of sweating of the scalp and face and neck areas.  She is contemplating on taking anastrozole every other day.  I discussed with her that the symptoms could get easier over time.  Return to clinic every 3 weeks for Herceptin every 6 weeks for follow-up with me with labs.    No orders of the defined types were placed in  this encounter.  The patient has a good understanding of the overall plan. she agrees with it. she will call with any problems that may develop before the next visit here.  Nicholas Lose, MD 06/14/2018  Julious Oka Dorshimer am acting as scribe for Dr. Nicholas Lose.  I have reviewed the above documentation for accuracy and completeness, and I agree with the above.

## 2018-06-14 ENCOUNTER — Inpatient Hospital Stay: Payer: Medicare Other

## 2018-06-14 ENCOUNTER — Other Ambulatory Visit: Payer: Self-pay

## 2018-06-14 ENCOUNTER — Inpatient Hospital Stay: Payer: Medicare Other | Attending: Hematology and Oncology

## 2018-06-14 ENCOUNTER — Inpatient Hospital Stay (HOSPITAL_BASED_OUTPATIENT_CLINIC_OR_DEPARTMENT_OTHER): Payer: Medicare Other | Admitting: Hematology and Oncology

## 2018-06-14 DIAGNOSIS — R413 Other amnesia: Secondary | ICD-10-CM | POA: Diagnosis not present

## 2018-06-14 DIAGNOSIS — Z79811 Long term (current) use of aromatase inhibitors: Secondary | ICD-10-CM | POA: Insufficient documentation

## 2018-06-14 DIAGNOSIS — G620 Drug-induced polyneuropathy: Secondary | ICD-10-CM

## 2018-06-14 DIAGNOSIS — Z5112 Encounter for antineoplastic immunotherapy: Secondary | ICD-10-CM | POA: Insufficient documentation

## 2018-06-14 DIAGNOSIS — C50412 Malignant neoplasm of upper-outer quadrant of left female breast: Secondary | ICD-10-CM

## 2018-06-14 DIAGNOSIS — Z170 Estrogen receptor positive status [ER+]: Secondary | ICD-10-CM | POA: Insufficient documentation

## 2018-06-14 DIAGNOSIS — Z923 Personal history of irradiation: Secondary | ICD-10-CM

## 2018-06-14 DIAGNOSIS — T451X5A Adverse effect of antineoplastic and immunosuppressive drugs, initial encounter: Secondary | ICD-10-CM | POA: Insufficient documentation

## 2018-06-14 DIAGNOSIS — Z95828 Presence of other vascular implants and grafts: Secondary | ICD-10-CM

## 2018-06-14 LAB — CBC WITH DIFFERENTIAL (CANCER CENTER ONLY)
Abs Immature Granulocytes: 0.01 10*3/uL (ref 0.00–0.07)
Basophils Absolute: 0 10*3/uL (ref 0.0–0.1)
Basophils Relative: 0 %
Eosinophils Absolute: 0.1 10*3/uL (ref 0.0–0.5)
Eosinophils Relative: 2 %
HCT: 37.1 % (ref 36.0–46.0)
Hemoglobin: 12.2 g/dL (ref 12.0–15.0)
Immature Granulocytes: 0 %
Lymphocytes Relative: 15 %
Lymphs Abs: 0.8 10*3/uL (ref 0.7–4.0)
MCH: 31 pg (ref 26.0–34.0)
MCHC: 32.9 g/dL (ref 30.0–36.0)
MCV: 94.2 fL (ref 80.0–100.0)
Monocytes Absolute: 0.6 10*3/uL (ref 0.1–1.0)
Monocytes Relative: 12 %
Neutro Abs: 3.5 10*3/uL (ref 1.7–7.7)
Neutrophils Relative %: 71 %
Platelet Count: 155 10*3/uL (ref 150–400)
RBC: 3.94 MIL/uL (ref 3.87–5.11)
RDW: 13.1 % (ref 11.5–15.5)
WBC Count: 4.9 10*3/uL (ref 4.0–10.5)
nRBC: 0 % (ref 0.0–0.2)

## 2018-06-14 LAB — CMP (CANCER CENTER ONLY)
ALT: 19 U/L (ref 0–44)
AST: 14 U/L — ABNORMAL LOW (ref 15–41)
Albumin: 3.4 g/dL — ABNORMAL LOW (ref 3.5–5.0)
Alkaline Phosphatase: 94 U/L (ref 38–126)
Anion gap: 8 (ref 5–15)
BUN: 21 mg/dL (ref 8–23)
CO2: 28 mmol/L (ref 22–32)
Calcium: 8.9 mg/dL (ref 8.9–10.3)
Chloride: 106 mmol/L (ref 98–111)
Creatinine: 0.79 mg/dL (ref 0.44–1.00)
GFR, Est AFR Am: >60 mL/min
GFR, Estimated: >60 mL/min
Glucose, Bld: 99 mg/dL (ref 70–99)
Potassium: 3.8 mmol/L (ref 3.5–5.1)
Sodium: 142 mmol/L (ref 135–145)
Total Bilirubin: 0.5 mg/dL (ref 0.3–1.2)
Total Protein: 6.4 g/dL — ABNORMAL LOW (ref 6.5–8.1)

## 2018-06-14 MED ORDER — SODIUM CHLORIDE 0.9% FLUSH
10.0000 mL | Freq: Once | INTRAVENOUS | Status: AC
  Administered 2018-06-14: 10 mL
  Filled 2018-06-14: qty 10

## 2018-06-14 MED ORDER — TRASTUZUMAB CHEMO 150 MG IV SOLR
6.0000 mg/kg | Freq: Once | INTRAVENOUS | Status: AC
  Administered 2018-06-14: 567 mg via INTRAVENOUS
  Filled 2018-06-14: qty 27

## 2018-06-14 MED ORDER — HEPARIN SOD (PORK) LOCK FLUSH 100 UNIT/ML IV SOLN
500.0000 [IU] | Freq: Once | INTRAVENOUS | Status: AC | PRN
  Administered 2018-06-14: 500 [IU]
  Filled 2018-06-14: qty 5

## 2018-06-14 MED ORDER — ACETAMINOPHEN 325 MG PO TABS
ORAL_TABLET | ORAL | Status: AC
  Filled 2018-06-14: qty 2

## 2018-06-14 MED ORDER — ACETAMINOPHEN 325 MG PO TABS
650.0000 mg | ORAL_TABLET | Freq: Once | ORAL | Status: AC
  Administered 2018-06-14: 650 mg via ORAL

## 2018-06-14 MED ORDER — DIPHENHYDRAMINE HCL 25 MG PO CAPS
ORAL_CAPSULE | ORAL | Status: AC
  Filled 2018-06-14: qty 2

## 2018-06-14 MED ORDER — SODIUM CHLORIDE 0.9 % IV SOLN
Freq: Once | INTRAVENOUS | Status: AC
  Administered 2018-06-14: 11:00:00 via INTRAVENOUS
  Filled 2018-06-14: qty 250

## 2018-06-14 MED ORDER — SODIUM CHLORIDE 0.9% FLUSH
10.0000 mL | INTRAVENOUS | Status: DC | PRN
  Administered 2018-06-14: 10 mL
  Filled 2018-06-14: qty 10

## 2018-06-14 MED ORDER — DIPHENHYDRAMINE HCL 25 MG PO CAPS
50.0000 mg | ORAL_CAPSULE | Freq: Once | ORAL | Status: AC
  Administered 2018-06-14: 50 mg via ORAL

## 2018-06-14 NOTE — Patient Instructions (Signed)
[facility] Discharge Instructions for Patients Receiving Chemotherapy  Today you received the following chemotherapy agents herceptin   To help prevent nausea and vomiting after your treatment, we encourage you to take your nausea medication as directed   If you develop nausea and vomiting that is not controlled by your nausea medication, call the clinic.   BELOW ARE SYMPTOMS THAT SHOULD BE REPORTED IMMEDIATELY:  *FEVER GREATER THAN 100.5 F  *CHILLS WITH OR WITHOUT FEVER  NAUSEA AND VOMITING THAT IS NOT CONTROLLED WITH YOUR NAUSEA MEDICATION  *UNUSUAL SHORTNESS OF BREATH  *UNUSUAL BRUISING OR BLEEDING  TENDERNESS IN MOUTH AND THROAT WITH OR WITHOUT PRESENCE OF ULCERS  *URINARY PROBLEMS  *BOWEL PROBLEMS  UNUSUAL RASH Items with * indicate a potential emergency and should be followed up as soon as possible.  Feel free to call the clinic you have any questions or concerns. The clinic phone number is [phone].  

## 2018-06-15 ENCOUNTER — Telehealth: Payer: Self-pay | Admitting: Hematology and Oncology

## 2018-06-15 NOTE — Telephone Encounter (Signed)
Left a message about cancel lab and additional appts

## 2018-07-05 ENCOUNTER — Other Ambulatory Visit: Payer: Medicare Other

## 2018-07-05 ENCOUNTER — Inpatient Hospital Stay: Payer: Medicare Other

## 2018-07-05 ENCOUNTER — Other Ambulatory Visit: Payer: Self-pay

## 2018-07-05 VITALS — BP 146/61 | HR 61 | Temp 97.1°F | Resp 20

## 2018-07-05 DIAGNOSIS — G620 Drug-induced polyneuropathy: Secondary | ICD-10-CM | POA: Diagnosis not present

## 2018-07-05 DIAGNOSIS — R413 Other amnesia: Secondary | ICD-10-CM | POA: Diagnosis not present

## 2018-07-05 DIAGNOSIS — Z5112 Encounter for antineoplastic immunotherapy: Secondary | ICD-10-CM | POA: Diagnosis not present

## 2018-07-05 DIAGNOSIS — C50412 Malignant neoplasm of upper-outer quadrant of left female breast: Secondary | ICD-10-CM | POA: Diagnosis not present

## 2018-07-05 DIAGNOSIS — Z170 Estrogen receptor positive status [ER+]: Secondary | ICD-10-CM

## 2018-07-05 DIAGNOSIS — T451X5A Adverse effect of antineoplastic and immunosuppressive drugs, initial encounter: Secondary | ICD-10-CM | POA: Diagnosis not present

## 2018-07-05 MED ORDER — ACETAMINOPHEN 325 MG PO TABS
650.0000 mg | ORAL_TABLET | Freq: Once | ORAL | Status: AC
  Administered 2018-07-05: 10:00:00 650 mg via ORAL

## 2018-07-05 MED ORDER — SODIUM CHLORIDE 0.9% FLUSH
10.0000 mL | INTRAVENOUS | Status: DC | PRN
  Administered 2018-07-05: 10 mL
  Filled 2018-07-05: qty 10

## 2018-07-05 MED ORDER — DIPHENHYDRAMINE HCL 25 MG PO CAPS
50.0000 mg | ORAL_CAPSULE | Freq: Once | ORAL | Status: AC
  Administered 2018-07-05: 50 mg via ORAL

## 2018-07-05 MED ORDER — DIPHENHYDRAMINE HCL 25 MG PO CAPS
ORAL_CAPSULE | ORAL | Status: AC
  Filled 2018-07-05: qty 2

## 2018-07-05 MED ORDER — HEPARIN SOD (PORK) LOCK FLUSH 100 UNIT/ML IV SOLN
500.0000 [IU] | Freq: Once | INTRAVENOUS | Status: AC | PRN
  Administered 2018-07-05: 500 [IU]
  Filled 2018-07-05: qty 5

## 2018-07-05 MED ORDER — ACETAMINOPHEN 325 MG PO TABS
ORAL_TABLET | ORAL | Status: AC
  Filled 2018-07-05: qty 2

## 2018-07-05 MED ORDER — TRASTUZUMAB CHEMO 150 MG IV SOLR
6.0000 mg/kg | Freq: Once | INTRAVENOUS | Status: AC
  Administered 2018-07-05: 567 mg via INTRAVENOUS
  Filled 2018-07-05: qty 27

## 2018-07-05 MED ORDER — SODIUM CHLORIDE 0.9 % IV SOLN
Freq: Once | INTRAVENOUS | Status: AC
  Administered 2018-07-05: 11:00:00 via INTRAVENOUS
  Filled 2018-07-05: qty 250

## 2018-07-05 NOTE — Patient Instructions (Signed)
[facility] Discharge Instructions for Patients Receiving Chemotherapy  Today you received the following chemotherapy agents herceptin   To help prevent nausea and vomiting after your treatment, we encourage you to take your nausea medication as directed   If you develop nausea and vomiting that is not controlled by your nausea medication, call the clinic.   BELOW ARE SYMPTOMS THAT SHOULD BE REPORTED IMMEDIATELY:  *FEVER GREATER THAN 100.5 F  *CHILLS WITH OR WITHOUT FEVER  NAUSEA AND VOMITING THAT IS NOT CONTROLLED WITH YOUR NAUSEA MEDICATION  *UNUSUAL SHORTNESS OF BREATH  *UNUSUAL BRUISING OR BLEEDING  TENDERNESS IN MOUTH AND THROAT WITH OR WITHOUT PRESENCE OF ULCERS  *URINARY PROBLEMS  *BOWEL PROBLEMS  UNUSUAL RASH Items with * indicate a potential emergency and should be followed up as soon as possible.  Feel free to call the clinic you have any questions or concerns. The clinic phone number is [phone].  

## 2018-07-07 ENCOUNTER — Other Ambulatory Visit: Payer: Self-pay | Admitting: *Deleted

## 2018-07-07 MED ORDER — PANTOPRAZOLE SODIUM 40 MG PO TBEC: 40.0000 mg | DELAYED_RELEASE_TABLET | Freq: Every day | ORAL | 0 refills | Status: DC

## 2018-07-07 NOTE — Progress Notes (Signed)
Received call from pt stating she has been experiencing severe indigestion post Herceptin infusions.  Pt states she is experiencing acid reflux and sour stomach.  States she has tried OTC Pepcid with no relief.  Pt stated that Tums help to control some of the acid, but does not completley take it away.  Per Dr. Lindi Adie, pt to take Protonix 40 mg p.o daily.  Prescription called into pt pharmacy.

## 2018-07-20 NOTE — Assessment & Plan Note (Signed)
09/11/2017:Left lumpectomy: Grade 2 invasive lobular cancer, 1.1 cm, LCIS, margins negative, 0/2 lymph nodes negative, ER 90% strong staining, PR negative, HER-2 positive ratio 2.62, Ki-67 1%, T1c N0 stage Ia  Recommendation: 1.Adjuvant therapy with Taxol Herceptin weekly x12 followed by Herceptin maintenance for 1 year 2.Adjuvant radiationcompleted 03/23/2018 3.Followed by adjuvant antiestrogen therapy started 05/03/2018 ------------------------------------------------------------------ Current treatment:Herceptin maintenance therapy every 3 weeks to be completed October 2020, antiestrogen therapy with anastrozole 1 mg daily4/20/2020 Chemo-induced peripheral neuropathy:Stable to improving Chemotherapy-induced memory loss: We talked about remember study. She may do this once treatment is complete  Anastrozole toxicities: Complains of sweating of the scalp and face and neck areas.  She is contemplating on taking anastrozole every other day.  I discussed with her that the symptoms could get easier over time.  Return to clinic every 3 weeks for Herceptin every 6 weeks for follow-up with me with labs.

## 2018-07-24 NOTE — Progress Notes (Signed)
Patient Care Team: Sasser, Silvestre Moment, MD as PCP - General (Unknown Physician Specialty) Excell Seltzer, MD as Consulting Physician (General Surgery) Nicholas Lose, MD as Consulting Physician (Hematology and Oncology) Gery Pray, MD as Consulting Physician (Radiation Oncology)  DIAGNOSIS:    ICD-10-CM   1. Malignant neoplasm of upper-outer quadrant of left breast in female, estrogen receptor positive ([place])  C50.412    Z17.0     SUMMARY OF ONCOLOGIC HISTORY: Oncology History  Malignant neoplasm of upper-outer quadrant of left breast in female, estrogen receptor positive ([place])  08/05/2017 Initial Diagnosis   Screening detected left breast calcifications UOQ 1.1 cm biopsy revealed invasive lobular cancer with LCIS and CSL, grade 2, ER 90%, PR 0%, Ki-67 1%, HER-2 positive ratio 2.62, T2N0 stage Ia clinical stage AJCC 8   08/12/2017 Cancer Staging   Staging form: Breast, AJCC 8th Edition - Clinical stage from 08/12/2017: Stage IA (cT1c, cN0, cM0, G2, ER+, PR-, HER2+) - Signed by Nicholas Lose, MD on 08/12/2017   09/11/2017 Surgery   Left lumpectomy: Grade 2 invasive lobular cancer, 1.1 cm, LCIS, margins negative, 0/2 lymph nodes negative, ER 90% strong staining, PR negative, HER-2 positive ratio 2.62, Ki-67 1%, T1c N0 stage Ia   09/23/2017 Cancer Staging   Staging form: Breast, AJCC 8th Edition - Pathologic: Stage IA (pT1c, pN0, cM0, G2, ER+, PR-, HER2+) - Signed by Gardenia Phlegm, NP on 09/23/2017   11/02/2017 -  Chemotherapy   Adjuvant therapy with Taxol Herceptin weekly x12 followed by Herceptin maintenance for 1 year   02/25/2018 - 03/22/2018 Radiation Therapy   Adj RT in [place]   05/03/2018 -  Anti-estrogen oral therapy   Anastrozole, 70m daily (plan for 7 years)     CHIEF COMPLIANT: Follow-up on Herceptin maintenance and anastrozole  INTERVAL HISTORY: Sylvia RACETTEis a 73y.o. with above-mentioned history of left breast cancer treated with lumpectomy, adjuvant  chemotherapy, and adjuvant radiation. She is a participant in the UpBeat clinical trial. She is currently on anti-estrogen therapy with anastrozole and is receiving Herceptin maintenance. She presents to the clinic today for treatment.  REVIEW OF SYSTEMS:   Constitutional: Denies fevers, chills or abnormal weight loss Eyes: Denies blurriness of vision Ears, nose, mouth, throat, and face: Denies mucositis or sore throat Respiratory: Denies cough, dyspnea or wheezes Cardiovascular: Denies palpitation, chest discomfort Gastrointestinal: Denies nausea, heartburn or change in bowel habits Skin: Denies abnormal skin rashes Lymphatics: Denies new lymphadenopathy or easy bruising Neurological: Denies numbness, tingling or new weaknesses Behavioral/Psych: Mood is stable, no new changes  Extremities: No lower extremity edema Breast: denies any pain or lumps or nodules in either breasts All other systems were reviewed with the patient and are negative.  I have reviewed the past medical history, past surgical history, social history and family history with the patient and they are unchanged from previous note.  ALLERGIES:  is allergic to fiorinal-codeine #3 [butalbital-asa-caff-codeine]; lansoprazole; latex; butalbital-aspirin-caffeine; sulfa antibiotics; femara [letrozole]; and sulfasalazine.  MEDICATIONS:  Current Outpatient Medications  Medication Sig Dispense Refill  . acetaminophen (TYLENOL) 325 MG tablet Take 325 mg by mouth every 6 (six) hours as needed.    .Marland Kitchenanastrozole (ARIMIDEX) 1 MG tablet Take 1 tablet (1 mg total) by mouth daily. 90 tablet 3  . atorvastatin (LIPITOR) 10 MG tablet Take 10 mg by mouth at bedtime.    . cyclobenzaprine (FLEXERIL) 10 MG tablet Take 10 mg by mouth 3 (three) times daily as needed for muscle spasms.     .Marland Kitchen  dicyclomine (BENTYL) 10 MG capsule Take 1 capsule (10 mg total) by mouth 3 (three) times daily as needed for spasms. Patient states that she started this 3  weeks ago (Patient taking differently: Take 10 mg by mouth 2 (two) times daily before a meal. ) 270 capsule 1  . escitalopram (LEXAPRO) 5 MG tablet Take 5 mg by mouth at bedtime.     . hydrochlorothiazide (HYDRODIURIL) 25 MG tablet Take 25 mg by mouth daily.    . hydroxypropyl methylcellulose / hypromellose (ISOPTO TEARS / GONIOVISC) 2.5 % ophthalmic solution Place 1 drop into both eyes 3 (three) times daily as needed for dry eyes.    . Menthol, Topical Analgesic, (ICY HOT) 5 % PADS Apply 1 patch topically daily as needed (pain).     . pantoprazole (PROTONIX) 40 MG tablet Take 1 tablet (40 mg total) by mouth daily. 30 tablet 0  . Trastuzumab (HERCEPTIN IV) Inject into the vein. Patient will need to take until October 2020     No current facility-administered medications for this visit.     PHYSICAL EXAMINATION: ECOG PERFORMANCE STATUS: 1 - Symptomatic but completely ambulatory  There were no vitals filed for this visit. There were no vitals filed for this visit.  GENERAL: alert, no distress and comfortable SKIN: skin color, texture, turgor are normal, no rashes or significant lesions EYES: normal, Conjunctiva are pink and non-injected, sclera clear OROPHARYNX: no exudate, no erythema and lips, buccal mucosa, and tongue normal  NECK: supple, thyroid normal size, non-tender, without nodularity LYMPH: no palpable lymphadenopathy in the cervical, axillary or inguinal LUNGS: clear to auscultation and percussion with normal breathing effort HEART: regular rate & rhythm and no murmurs and no lower extremity edema ABDOMEN: abdomen soft, non-tender and normal bowel sounds MUSCULOSKELETAL: no cyanosis of digits and no clubbing  NEURO: alert & oriented x 3 with fluent speech, no focal motor/sensory deficits EXTREMITIES: No lower extremity edema  LABORATORY DATA:  I have reviewed the data as listed CMP Latest Ref Rng & Units 06/14/2018 05/03/2018 03/22/2018  Glucose 70 - 99 mg/dL 99 107(H) 98  BUN 8  - 23 mg/dL _0 Creatinine 0.44 - 1.00 mg/dL 0.79 0.77 0.78  Sodium 135 - 145 mmol/L 142 143 144  Potassium 3.5 - 5.1 mmol/L 3.8 3.6 3.6  Chloride 98 - 111 mmol/L 106 107 105  CO2 22 - 32 mmol/L _1 Calcium 8.9 - 10.3 mg/dL 8.9 8.9 9.2  Total Protein 6.5 - 8.1 g/dL 6.4(L) 6.5 6.9  Total Bilirubin 0.3 - 1.2 mg/dL 0.5 0.4 0.6  Alkaline Phos 38 - 126 U/L 94 88 84  AST 15 - 41 U/L 14(L) 14(L) 17  ALT 0 - 44 U/L _2 Lab Results  Component Value Date   WBC 4.9 06/14/2018   HGB 12.2 06/14/2018   HCT 37.1 06/14/2018   MCV 94.2 06/14/2018   PLT 155 06/14/2018   NEUTROABS 3.5 06/14/2018    ASSESSMENT & PLAN:  Malignant neoplasm of upper-outer quadrant of left breast in female, estrogen receptor positive ([facility]) 09/11/2017:Left lumpectomy: Grade 2 invasive lobular cancer, 1.1 cm, LCIS, margins negative, 0/2 lymph nodes negative, ER 90% strong staining, PR negative, HER-2 positive ratio 2.62, Ki-67 1%, T1c N0 stage Ia  Recommendation: 1.Adjuvant therapy with Taxol Herceptin weekly x12 followed by Herceptin maintenance for 1 year 2.Adjuvant radiationcompleted 03/23/2018 3.Followed by adjuvant antiestrogen therapy started 05/03/2018 ------------------------------------------------------------------ Current treatment:Herceptin maintenance therapy every 3 weeks to be completed  October 2020, antiestrogen therapy with anastrozole 1 mg daily4/20/2020 Chemo-induced peripheral neuropathy:Stable to improving Chemotherapy-induced memory loss: We talked about remember study. She may do this once treatment is complete  Anastrozole toxicities: Complains of sweating of the scalp and face and neck areas.  She is contemplating on taking anastrozole every other day.  I discussed with her that the symptoms could get easier over time.  Return to clinic every 3 weeks for Herceptin every 6 weeks for follow-up with me with labs.     No orders of the defined types were placed in  this encounter.  The patient has a good understanding of the overall plan. she agrees with it. she will call with any problems that may develop before the next visit here.  Nicholas Lose, MD 07/26/2018  Julious Oka Dorshimer am acting as scribe for Dr. Nicholas Lose.  I have reviewed the above documentation for accuracy and completeness, and I agree with the above.

## 2018-07-26 ENCOUNTER — Other Ambulatory Visit: Payer: Self-pay | Admitting: Hematology and Oncology

## 2018-07-26 ENCOUNTER — Inpatient Hospital Stay: Payer: Medicare Other | Attending: Hematology and Oncology | Admitting: Hematology and Oncology

## 2018-07-26 ENCOUNTER — Inpatient Hospital Stay: Payer: Medicare Other

## 2018-07-26 ENCOUNTER — Other Ambulatory Visit: Payer: Self-pay

## 2018-07-26 ENCOUNTER — Other Ambulatory Visit: Payer: Self-pay | Admitting: *Deleted

## 2018-07-26 ENCOUNTER — Encounter: Payer: Self-pay | Admitting: *Deleted

## 2018-07-26 DIAGNOSIS — C50412 Malignant neoplasm of upper-outer quadrant of left female breast: Secondary | ICD-10-CM | POA: Insufficient documentation

## 2018-07-26 DIAGNOSIS — Z923 Personal history of irradiation: Secondary | ICD-10-CM | POA: Diagnosis not present

## 2018-07-26 DIAGNOSIS — Z170 Estrogen receptor positive status [ER+]: Secondary | ICD-10-CM

## 2018-07-26 DIAGNOSIS — Z79899 Other long term (current) drug therapy: Secondary | ICD-10-CM | POA: Diagnosis not present

## 2018-07-26 DIAGNOSIS — Z5181 Encounter for therapeutic drug level monitoring: Secondary | ICD-10-CM

## 2018-07-26 DIAGNOSIS — Z5112 Encounter for antineoplastic immunotherapy: Secondary | ICD-10-CM | POA: Insufficient documentation

## 2018-07-26 DIAGNOSIS — Z9111 Patient's noncompliance with dietary regimen: Secondary | ICD-10-CM | POA: Diagnosis not present

## 2018-07-26 DIAGNOSIS — Z95828 Presence of other vascular implants and grafts: Secondary | ICD-10-CM

## 2018-07-26 LAB — CMP (CANCER CENTER ONLY)
ALT: 17 U/L (ref 0–44)
AST: 16 U/L (ref 15–41)
Albumin: 3.6 g/dL (ref 3.5–5.0)
Alkaline Phosphatase: 88 U/L (ref 38–126)
Anion gap: 9 (ref 5–15)
BUN: 21 mg/dL (ref 8–23)
CO2: 26 mmol/L (ref 22–32)
Calcium: 8.8 mg/dL — ABNORMAL LOW (ref 8.9–10.3)
Chloride: 110 mmol/L (ref 98–111)
Creatinine: 0.95 mg/dL (ref 0.44–1.00)
GFR, Est AFR Am: >60 mL/min (ref >60)
GFR, Estimated: 59 mL/min — ABNORMAL LOW (ref >60)
Glucose, Bld: 110 mg/dL — ABNORMAL HIGH (ref 70–99)
Potassium: 3.7 mmol/L (ref 3.5–5.1)
Sodium: 145 mmol/L (ref 135–145)
Total Bilirubin: 0.3 mg/dL (ref 0.3–1.2)
Total Protein: 6.6 g/dL (ref 6.5–8.1)

## 2018-07-26 LAB — CBC WITH DIFFERENTIAL (CANCER CENTER ONLY)
Abs Immature Granulocytes: 0.01 10*3/uL (ref 0.00–0.07)
Basophils Absolute: 0 10*3/uL (ref 0.0–0.1)
Basophils Relative: 1 %
Eosinophils Absolute: 0.2 10*3/uL (ref 0.0–0.5)
Eosinophils Relative: 4 %
HCT: 38.8 % (ref 36.0–46.0)
Hemoglobin: 12.8 g/dL (ref 12.0–15.0)
Immature Granulocytes: 0 %
Lymphocytes Relative: 16 %
Lymphs Abs: 1 10*3/uL (ref 0.7–4.0)
MCH: 31.4 pg (ref 26.0–34.0)
MCHC: 33 g/dL (ref 30.0–36.0)
MCV: 95.1 fL (ref 80.0–100.0)
Monocytes Absolute: 0.5 10*3/uL (ref 0.1–1.0)
Monocytes Relative: 8 %
Neutro Abs: 4.5 10*3/uL (ref 1.7–7.7)
Neutrophils Relative %: 71 %
Platelet Count: 166 10*3/uL (ref 150–400)
RBC: 4.08 MIL/uL (ref 3.87–5.11)
RDW: 12.7 % (ref 11.5–15.5)
WBC Count: 6.2 10*3/uL (ref 4.0–10.5)
nRBC: 0 % (ref 0.0–0.2)

## 2018-07-26 MED ORDER — ACETAMINOPHEN 325 MG PO TABS
650.0000 mg | ORAL_TABLET | Freq: Once | ORAL | Status: AC
  Administered 2018-07-26: 650 mg via ORAL

## 2018-07-26 MED ORDER — ACETAMINOPHEN 325 MG PO TABS
ORAL_TABLET | ORAL | Status: AC
  Filled 2018-07-26: qty 2

## 2018-07-26 MED ORDER — HEPARIN SOD (PORK) LOCK FLUSH 100 UNIT/ML IV SOLN
500.0000 [IU] | Freq: Once | INTRAVENOUS | Status: AC | PRN
  Administered 2018-07-26: 500 [IU]
  Filled 2018-07-26: qty 5

## 2018-07-26 MED ORDER — TRASTUZUMAB CHEMO 150 MG IV SOLR
6.0000 mg/kg | Freq: Once | INTRAVENOUS | Status: AC
  Administered 2018-07-26: 567 mg via INTRAVENOUS
  Filled 2018-07-26: qty 27

## 2018-07-26 MED ORDER — SODIUM CHLORIDE 0.9 % IV SOLN
Freq: Once | INTRAVENOUS | Status: AC
  Administered 2018-07-26: 10:00:00 via INTRAVENOUS
  Filled 2018-07-26: qty 250

## 2018-07-26 MED ORDER — SODIUM CHLORIDE 0.9% FLUSH
10.0000 mL | INTRAVENOUS | Status: DC | PRN
  Administered 2018-07-26: 10 mL
  Filled 2018-07-26: qty 10

## 2018-07-26 MED ORDER — SODIUM CHLORIDE 0.9% FLUSH
10.0000 mL | Freq: Once | INTRAVENOUS | Status: AC
  Administered 2018-07-26: 10 mL
  Filled 2018-07-26: qty 10

## 2018-07-26 MED ORDER — DIPHENHYDRAMINE HCL 25 MG PO CAPS
ORAL_CAPSULE | ORAL | Status: AC
  Filled 2018-07-26: qty 2

## 2018-07-26 MED ORDER — DIPHENHYDRAMINE HCL 25 MG PO CAPS
50.0000 mg | ORAL_CAPSULE | Freq: Once | ORAL | Status: AC
  Administered 2018-07-26: 50 mg via ORAL

## 2018-07-26 MED ORDER — PANTOPRAZOLE SODIUM 40 MG PO TBEC: 40.0000 mg | DELAYED_RELEASE_TABLET | Freq: Every day | ORAL | 3 refills | Status: DC

## 2018-07-26 NOTE — Progress Notes (Signed)
Ok to treat with Herceptin today with ECHO from 03/31/18.

## 2018-07-26 NOTE — Patient Instructions (Signed)
[facility] Discharge Instructions for Patients Receiving Chemotherapy  Today you received the following Immunotherapy:  Herceptin  To help prevent nausea and vomiting after your treatment, we encourage you to take your nausea medication as directed by your MD.   If you develop nausea and vomiting that is not controlled by your nausea medication, call the clinic.   BELOW ARE SYMPTOMS THAT SHOULD BE REPORTED IMMEDIATELY:  *FEVER GREATER THAN 100.5 F  *CHILLS WITH OR WITHOUT FEVER  NAUSEA AND VOMITING THAT IS NOT CONTROLLED WITH YOUR NAUSEA MEDICATION  *UNUSUAL SHORTNESS OF BREATH  *UNUSUAL BRUISING OR BLEEDING  TENDERNESS IN MOUTH AND THROAT WITH OR WITHOUT PRESENCE OF ULCERS  *URINARY PROBLEMS  *BOWEL PROBLEMS  UNUSUAL RASH Items with * indicate a potential emergency and should be followed up as soon as possible.  Feel free to call the clinic should you have any questions or concerns. The clinic phone number is [phone].  Please show the CHEMO ALERT CARD at check-in to the Emergency Department and triage nurse.  Coronavirus (COVID-19) Are you at risk?  Are you at risk for the Coronavirus (COVID-19)?  To be considered HIGH RISK for Coronavirus (COVID-19), you have to meet the following criteria:  . Traveled to [place], [place], [place], [place] or [place]; or in the [place] to [place], [place], [place], or [place]; and have fever, cough, and shortness of breath within the last 2 weeks of travel OR . Been in close contact with a person diagnosed with COVID-19 within the last 2 weeks and have fever, cough, and shortness of breath . IF YOU DO NOT MEET THESE CRITERIA, YOU ARE CONSIDERED LOW RISK FOR COVID-19.  What to do if you are HIGH RISK for COVID-19?  . If you are having a medical emergency, call 911. . Seek medical care right away. Before you go to a doctor's office, urgent care or emergency department, call ahead and tell them about  your recent travel, contact with someone diagnosed with COVID-19, and your symptoms. You should receive instructions from your physician's office regarding next steps of care.  . When you arrive at healthcare provider, tell the healthcare staff immediately you have returned from visiting [place], [place], [place], [place] or [place]; or traveled in the [place] to [place], [place], [place], or [place]; in the last two weeks or you have been in close contact with a person diagnosed with COVID-19 in the last 2 weeks.   . Tell the health care staff about your symptoms: fever, cough and shortness of breath. . After you have been seen by a medical provider, you will be either: o Tested for (COVID-19) and discharged home on quarantine except to seek medical care if symptoms worsen, and asked to  - Stay home and avoid contact with others until you get your results (4-5 days)  - Avoid travel on public transportation if possible (such as bus, train, or airplane) or o Sent to the Emergency Department by EMS for evaluation, COVID-19 testing, and possible admission depending on your condition and test results.  What to do if you are LOW RISK for COVID-19?  Reduce your risk of any infection by using the same precautions used for avoiding the common cold or flu:  . Wash your hands often with soap and warm water for at least 20 seconds.  If soap and water are not readily available, use an alcohol-based hand sanitizer with at least 60% alcohol.  . If   coughing or sneezing, cover your mouth and nose by coughing or sneezing into the elbow areas of your shirt or coat, into a tissue or into your sleeve (not your hands). . Avoid shaking hands with others and consider head nods or verbal greetings only. . Avoid touching your eyes, nose, or mouth with unwashed hands.  . Avoid close contact with people who are sick. . Avoid places or events with large numbers of people in one location, like concerts or sporting  events. . Carefully consider travel plans you have or are making. . If you are planning any travel outside or inside the [place], visit the CDC's Travelers' Health webpage for the latest health notices. . If you have some symptoms but not all symptoms, continue to monitor at home and seek medical attention if your symptoms worsen. . If you are having a medical emergency, call 911.   ADDITIONAL HEALTHCARE OPTIONS FOR PATIENTS  [facility] / e-Visit: [email or web address]         [facility]: [phone]  [facility]: [phone]                   [facility]: [phone]    

## 2018-07-28 DIAGNOSIS — Z170 Estrogen receptor positive status [ER+]: Secondary | ICD-10-CM | POA: Diagnosis not present

## 2018-07-28 DIAGNOSIS — C50412 Malignant neoplasm of upper-outer quadrant of left female breast: Secondary | ICD-10-CM | POA: Diagnosis not present

## 2018-08-05 ENCOUNTER — Ambulatory Visit (HOSPITAL_COMMUNITY)
Admission: RE | Admit: 2018-08-05 | Discharge: 2018-08-05 | Disposition: A | Discharge status: Home / Self Care | Payer: Medicare Other | Source: Ambulatory Visit | Attending: Hematology and Oncology | Admitting: Hematology and Oncology

## 2018-08-05 ENCOUNTER — Other Ambulatory Visit: Payer: Self-pay

## 2018-08-05 DIAGNOSIS — Z79899 Other long term (current) drug therapy: Secondary | ICD-10-CM | POA: Insufficient documentation

## 2018-08-05 DIAGNOSIS — I350 Nonrheumatic aortic (valve) stenosis: Secondary | ICD-10-CM | POA: Diagnosis not present

## 2018-08-05 DIAGNOSIS — Z5181 Encounter for therapeutic drug level monitoring: Secondary | ICD-10-CM | POA: Diagnosis not present

## 2018-08-10 ENCOUNTER — Ambulatory Visit (INDEPENDENT_AMBULATORY_CARE_PROVIDER_SITE_OTHER): Payer: Medicare Other | Admitting: Internal Medicine

## 2018-08-10 ENCOUNTER — Other Ambulatory Visit: Payer: Self-pay

## 2018-08-10 ENCOUNTER — Encounter (INDEPENDENT_AMBULATORY_CARE_PROVIDER_SITE_OTHER): Payer: Self-pay | Admitting: Internal Medicine

## 2018-08-10 DIAGNOSIS — K219 Gastro-esophageal reflux disease without esophagitis: Secondary | ICD-10-CM | POA: Diagnosis not present

## 2018-08-10 DIAGNOSIS — K580 Irritable bowel syndrome with diarrhea: Secondary | ICD-10-CM

## 2018-08-10 DIAGNOSIS — K589 Irritable bowel syndrome without diarrhea: Secondary | ICD-10-CM | POA: Insufficient documentation

## 2018-08-10 NOTE — Patient Instructions (Signed)
Continue pantoprazole at current dose until you finish Herceptin treatment.  Thereafter you can try every other day. Can increase dicyclomine to 10 mg 3 times a day or can take Imodium OTC 2 mg twice a day on as-needed basis. Surveillance colonoscopy to be scheduled in November 2020.

## 2018-08-10 NOTE — Progress Notes (Signed)
Presenting complaint;  Follow-up for GERD and diarrhea.   Database and subjective:  Patient is 73 year old Caucasian female who has a history of lymphocytic colitis as well as IBS who was last seen in February this year for melena.  Patient was advised to stop meloxicam.  Her hemoglobin was 11.2.  She underwent esophagogastroduodenoscopy on 02/26/2018.  She had 3 cm sliding hiatal hernia erosive antral gastritis.  Biopsy was negative for H. pylori.  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  Patient was treated with Esomeprazole but she developed ankle edema and was switched to famotidine.  She stopped famotidine because it did not help control heartburn.  She states she did call our office once with there was no response. She saw Dr. Lindi Adie, her oncologist who started her on pantoprazole.  She took a daily for couple of weeks and now using on as-needed basis when she has heartburn.  Patient denies nausea vomiting dysphagia hoarseness sore throat or chronic cough.  She is not having any side effects with pantoprazole.  Patient also has not experienced any more episodes of melena.  She also denies rectal bleeding but she continues have intermittent diarrhea with urgency and she has accidents if she does not get to the bathroom quickly.  Her appetite is normal.  Her weight is down by 3 pounds.  She remains on Herceptin IV infusion every 3 weeks.  She has 3 more infusions to go.  Current Medications: Outpatient Encounter Medications as of 08/10/2018  Medication Sig  . acetaminophen (TYLENOL) 325 MG tablet Take 325 mg by mouth every 6 (six) hours as needed.  Marland Kitchen anastrozole (ARIMIDEX) 1 MG tablet Take 1 tablet (1 mg total) by mouth daily.  Marland Kitchen atorvastatin (LIPITOR) 10 MG tablet Take 10 mg by mouth at bedtime.  . calcium carbonate (TUMS - DOSED IN MG ELEMENTAL CALCIUM) 500 MG chewable tablet Chew 1 tablet by mouth as needed for indigestion or heartburn.  . dicyclomine (BENTYL)  10 MG capsule Take 1 capsule (10 mg total) by mouth 3 (three) times daily as needed for spasms. Patient states that she started this 3 weeks ago (Patient taking differently: Take 10 mg by mouth 2 (two) times daily before a meal. )  . escitalopram (LEXAPRO) 5 MG tablet Take 5 mg by mouth at bedtime.   . hydrochlorothiazide (HYDRODIURIL) 25 MG tablet Take 25 mg by mouth daily.  . hydroxypropyl methylcellulose / hypromellose (ISOPTO TEARS / GONIOVISC) 2.5 % ophthalmic solution Place 1 drop into both eyes 3 (three) times daily as needed for dry eyes.  Marland Kitchen loperamide (IMODIUM) 2 MG capsule Take by mouth daily.  . Menthol, Topical Analgesic, (ICY HOT) 5 % PADS Apply 1 patch topically daily as needed (pain).   . pantoprazole (PROTONIX) 40 MG tablet Take 1 tablet (40 mg total) by mouth daily.  . Trastuzumab (HERCEPTIN IV) Inject into the vein. Patient will need to take until October 2020  . [DISCONTINUED] prochlorperazine (COMPAZINE) 10 MG tablet Take 1 tablet (10 mg total) by mouth every 6 (six) hours as needed (Nausea or vomiting).   No facility-administered encounter medications on file as of 08/10/2018.      Objective: Blood pressure (!) 162/85, pulse 75, temperature 98 F (36.7 C), temperature source Oral, resp. rate 18, height 5' 5"  (1.651 m), weight 208 lb 6.4 oz (94.5 kg). Patient is alert and in no acute distress. Conjunctiva is pink. Sclera is nonicteric Oropharyngeal mucosa is normal. No neck masses or  thyromegaly noted. Cardiac exam with regular rhythm normal S1 and S2. No murmur or gallop noted. Lungs are clear to auscultation. Abdomen is full.  She has appendectomy scar in right lower quadrant.  Bowel sounds are normal.  On palpation abdomen is soft and nontender with organomegaly or masses. No LE edema or clubbing noted.  Labs/studies Results:  CBC Latest Ref Rng & Units 07/26/2018 06/14/2018 05/03/2018  WBC 4.0 - 10.5 K/uL 6.2 4.9 4.4  Hemoglobin 12.0 - 15.0 g/dL 12.8 12.2 12.8   Hematocrit 36.0 - 46.0 % 38.8 37.1 39.3  Platelets 150 - 400 K/uL 166 155 152    CMP Latest Ref Rng & Units 07/26/2018 06/14/2018 05/03/2018  Glucose 70 - 99 mg/dL 110(H) 99 107(H)  BUN 8 - 23 mg/dL 21 21 18   Creatinine 0.44 - 1.00 mg/dL 0.95 0.79 0.77  Sodium 135 - 145 mmol/L 145 142 143  Potassium 3.5 - 5.1 mmol/L 3.7 3.8 3.6  Chloride 98 - 111 mmol/L 110 106 107  CO2 22 - 32 mmol/L 26 28 25   Calcium 8.9 - 10.3 mg/dL 8.8(L) 8.9 8.9  Total Protein 6.5 - 8.1 g/dL 6.6 6.4(L) 6.5  Total Bilirubin 0.3 - 1.2 mg/dL 0.3 0.5 0.4  Alkaline Phos 38 - 126 U/L 88 94 88  AST 15 - 41 U/L 16 14(L) 14(L)  ALT 0 - 44 U/L 17 19 18     Hepatic Function Latest Ref Rng & Units 07/26/2018 06/14/2018 05/03/2018  Total Protein 6.5 - 8.1 g/dL 6.6 6.4(L) 6.5  Albumin 3.5 - 5.0 g/dL 3.6 3.4(L) 3.5  AST 15 - 41 U/L 16 14(L) 14(L)  ALT 0 - 44 U/L 17 19 18   Alk Phosphatase 38 - 126 U/L 88 94 88  Total Bilirubin 0.3 - 1.2 mg/dL 0.3 0.5 0.4     Assessment:  #1.  GERD.  EGD back in February 2020 revealed 3 cm sliding hiatal hernia but she did not have evidence of erosive esophagitis.  She is doing well with pantoprazole.  I would recommend she should take it every day until she finishes Herceptin at which time dose can be changed to every other day.  #2.  Diarrhea and urgency.  She has a history of lymphocytic colitis and she is also felt to have irritable bowel syndrome.  Will continue with symptomatic therapy and her colonoscopy.  If lymphocytic colitis is active she will be treated with short course of budesonide.  #3.  History of melena for which she underwent EGD back in February 2020.  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  Hemoglobin is now normal.  #4.  History of colonic adenomas.  She had 2 small adenomas removed in October 2015.  She will be due for surveillance colonoscopy later this year.   Plan:  Continue pantoprazole 40 mg by mouth 30 minutes before breakfast daily  for another 3 months and thereafter every other day. Patient can take an extra dose of dicyclomine and/or Imodium OTC release on days when she has to leave the house. Surveillance colonoscopy in November 2020 at which time she will also undergo repeat biopsy to determine whether or not lymphocytic colitis is in remission or active. Office visit in 6 months.

## 2018-08-16 ENCOUNTER — Other Ambulatory Visit: Payer: Self-pay

## 2018-08-16 ENCOUNTER — Inpatient Hospital Stay: Payer: Medicare Other | Attending: Hematology and Oncology

## 2018-08-16 VITALS — BP 145/60 | HR 64 | Temp 98.3°F | Resp 16

## 2018-08-16 DIAGNOSIS — C50412 Malignant neoplasm of upper-outer quadrant of left female breast: Secondary | ICD-10-CM | POA: Diagnosis not present

## 2018-08-16 DIAGNOSIS — Z79899 Other long term (current) drug therapy: Secondary | ICD-10-CM | POA: Diagnosis not present

## 2018-08-16 DIAGNOSIS — Z923 Personal history of irradiation: Secondary | ICD-10-CM | POA: Insufficient documentation

## 2018-08-16 DIAGNOSIS — Z170 Estrogen receptor positive status [ER+]: Secondary | ICD-10-CM | POA: Diagnosis not present

## 2018-08-16 DIAGNOSIS — Z5112 Encounter for antineoplastic immunotherapy: Secondary | ICD-10-CM | POA: Diagnosis not present

## 2018-08-16 MED ORDER — SODIUM CHLORIDE 0.9% FLUSH
10.0000 mL | INTRAVENOUS | Status: DC | PRN
  Administered 2018-08-16: 11:00:00 10 mL
  Filled 2018-08-16: qty 10

## 2018-08-16 MED ORDER — ACETAMINOPHEN 325 MG PO TABS
ORAL_TABLET | ORAL | Status: AC
  Filled 2018-08-16: qty 2

## 2018-08-16 MED ORDER — TRASTUZUMAB CHEMO 150 MG IV SOLR
6.0000 mg/kg | Freq: Once | INTRAVENOUS | Status: AC
  Administered 2018-08-16: 567 mg via INTRAVENOUS
  Filled 2018-08-16: qty 27

## 2018-08-16 MED ORDER — ACETAMINOPHEN 325 MG PO TABS
650.0000 mg | ORAL_TABLET | Freq: Once | ORAL | Status: AC
  Administered 2018-08-16: 650 mg via ORAL

## 2018-08-16 MED ORDER — DIPHENHYDRAMINE HCL 25 MG PO CAPS
50.0000 mg | ORAL_CAPSULE | Freq: Once | ORAL | Status: AC
  Administered 2018-08-16: 50 mg via ORAL

## 2018-08-16 MED ORDER — DIPHENHYDRAMINE HCL 25 MG PO CAPS
ORAL_CAPSULE | ORAL | Status: AC
  Filled 2018-08-16: qty 2

## 2018-08-16 MED ORDER — SODIUM CHLORIDE 0.9 % IV SOLN
Freq: Once | INTRAVENOUS | Status: AC
  Administered 2018-08-16: 10:00:00 via INTRAVENOUS
  Filled 2018-08-16: qty 250

## 2018-08-16 MED ORDER — HEPARIN SOD (PORK) LOCK FLUSH 100 UNIT/ML IV SOLN
500.0000 [IU] | Freq: Once | INTRAVENOUS | Status: AC | PRN
  Administered 2018-08-16: 11:00:00 500 [IU]
  Filled 2018-08-16: qty 5

## 2018-08-16 NOTE — Patient Instructions (Signed)
[facility] Discharge Instructions for Patients Receiving Chemotherapy  Today you received the following Immunotherapy: Herceptin.  To help prevent nausea and vomiting after your treatment, we encourage you to take your nausea medication as directed by your MD.   If you develop nausea and vomiting that is not controlled by your nausea medication, call the clinic.   BELOW ARE SYMPTOMS THAT SHOULD BE REPORTED IMMEDIATELY:  *FEVER GREATER THAN 100.5 F  *CHILLS WITH OR WITHOUT FEVER  NAUSEA AND VOMITING THAT IS NOT CONTROLLED WITH YOUR NAUSEA MEDICATION  *UNUSUAL SHORTNESS OF BREATH  *UNUSUAL BRUISING OR BLEEDING  TENDERNESS IN MOUTH AND THROAT WITH OR WITHOUT PRESENCE OF ULCERS  *URINARY PROBLEMS  *BOWEL PROBLEMS  UNUSUAL RASH Items with * indicate a potential emergency and should be followed up as soon as possible.  Feel free to call the clinic should you have any questions or concerns. The clinic phone number is [phone].  Please show the [place] at check-in to the Emergency Department and triage nurse.

## 2018-08-30 NOTE — Assessment & Plan Note (Signed)
09/11/2017:Left lumpectomy: Grade 2 invasive lobular cancer, 1.1 cm, LCIS, margins negative, 0/2 lymph nodes negative, ER 90% strong staining, PR negative, HER-2 positive ratio 2.62, Ki-67 1%, T1c N0 stage Ia  Recommendation: 1.Adjuvant therapy with Taxol Herceptin weekly x12 followed by Herceptin maintenance for 1 year 2.Adjuvant radiationcompleted 03/23/2018 3.Followed by adjuvant antiestrogen therapystarted 05/03/2018 ------------------------------------------------------------------ Current treatment:Herceptin maintenance therapy every 3 weeksto be completed October 2020, antiestrogen therapy with anastrozole 1 mg daily4/20/2020 Chemo-induced peripheral neuropathy:Stable to improving Chemotherapy-induced memory loss: We talked about remember study. She may do this once treatment is complete  Anastrozoletoxicities:Complains of sweating of the scalp and face and neck areas. She is contemplating on taking anastrozole every other day. I discussed with her that the symptoms could get easier over time.  Return to clinic every 3 weeks for Herceptin every 6 weeks for follow-up with me with labs.

## 2018-09-05 NOTE — Progress Notes (Signed)
Patient Care Team: Sasser, Silvestre Moment, MD as PCP - General (Unknown Physician Specialty) Excell Seltzer, MD as Consulting Physician (General Surgery) Nicholas Lose, MD as Consulting Physician (Hematology and Oncology) Gery Pray, MD as Consulting Physician (Radiation Oncology)  DIAGNOSIS:    ICD-10-CM   1. Malignant neoplasm of upper-outer quadrant of left breast in female, estrogen receptor positive ([place])  C50.412    Z17.0     SUMMARY OF ONCOLOGIC HISTORY: Oncology History  Malignant neoplasm of upper-outer quadrant of left breast in female, estrogen receptor positive ([place])  08/05/2017 Initial Diagnosis   Screening detected left breast calcifications UOQ 1.1 cm biopsy revealed invasive lobular cancer with LCIS and CSL, grade 2, ER 90%, PR 0%, Ki-67 1%, HER-2 positive ratio 2.62, T2N0 stage Ia clinical stage AJCC 8   08/12/2017 Cancer Staging   Staging form: Breast, AJCC 8th Edition - Clinical stage from 08/12/2017: Stage IA (cT1c, cN0, cM0, G2, ER+, PR-, HER2+) - Signed by Nicholas Lose, MD on 08/12/2017   09/11/2017 Surgery   Left lumpectomy: Grade 2 invasive lobular cancer, 1.1 cm, LCIS, margins negative, 0/2 lymph nodes negative, ER 90% strong staining, PR negative, HER-2 positive ratio 2.62, Ki-67 1%, T1c N0 stage Ia   09/23/2017 Cancer Staging   Staging form: Breast, AJCC 8th Edition - Pathologic: Stage IA (pT1c, pN0, cM0, G2, ER+, PR-, HER2+) - Signed by Gardenia Phlegm, NP on 09/23/2017   11/02/2017 -  Chemotherapy   Adjuvant therapy with Taxol Herceptin weekly x12 followed by Herceptin maintenance for 1 year   02/25/2018 - 03/22/2018 Radiation Therapy   Adj RT in [place]   05/03/2018 -  Anti-estrogen oral therapy   Anastrozole, 48m daily (plan for 7 years)     CHIEF COMPLIANT: Follow-up on Herceptinmaintenance and anastrozole  INTERVAL HISTORY: PSCARLETH BRAMEis a 73y.o. with above-mentioned history of left breast cancer treated with lumpectomy,adjuvant  chemotherapy,and adjuvant radiation. She is a participant in the [employer] clinical trial. She is currently on anti-estrogen therapy with anastrozole and isreceiving Herceptin maintenance. Echo on 08/05/18 showed an ejection fraction in the range of 60-65%. Shepresents to the clinic today for treatment.  REVIEW OF SYSTEMS:   Constitutional: Denies fevers, chills or abnormal weight loss Eyes: Denies blurriness of vision Ears, nose, mouth, throat, and face: Denies mucositis or sore throat Respiratory: Denies cough, dyspnea or wheezes Cardiovascular: Denies palpitation, chest discomfort Gastrointestinal: Denies nausea, heartburn or change in bowel habits Skin: Denies abnormal skin rashes Lymphatics: Denies new lymphadenopathy or easy bruising Neurological: Denies numbness, tingling or new weaknesses Behavioral/Psych: Mood is stable, no new changes  Extremities: No lower extremity edema Breast: denies any pain or lumps or nodules in either breasts All other systems were reviewed with the patient and are negative.  I have reviewed the past medical history, past surgical history, social history and family history with the patient and they are unchanged from previous note.  ALLERGIES:  is allergic to fiorinal-codeine #3 [butalbital-asa-caff-codeine]; lansoprazole; latex; butalbital-aspirin-caffeine; sulfa antibiotics; femara [letrozole]; and sulfasalazine.  MEDICATIONS:  Current Outpatient Medications  Medication Sig Dispense Refill  . acetaminophen (TYLENOL) 325 MG tablet Take 325 mg by mouth every 6 (six) hours as needed.    .Marland Kitchenanastrozole (ARIMIDEX) 1 MG tablet Take 1 tablet (1 mg total) by mouth daily. 90 tablet 3  . atorvastatin (LIPITOR) 10 MG tablet Take 10 mg by mouth at bedtime.    . calcium carbonate (TUMS - DOSED IN MG ELEMENTAL CALCIUM) 500 MG chewable tablet Chew 1  tablet by mouth as needed for indigestion or heartburn.    . dicyclomine (BENTYL) 10 MG capsule Take 1 capsule (10 mg  total) by mouth 3 (three) times daily as needed for spasms. Patient states that she started this 3 weeks ago (Patient taking differently: Take 10 mg by mouth 2 (two) times daily before a meal. ) 270 capsule 1  . escitalopram (LEXAPRO) 5 MG tablet Take 5 mg by mouth at bedtime.     . hydrochlorothiazide (HYDRODIURIL) 25 MG tablet Take 25 mg by mouth daily.    . hydroxypropyl methylcellulose / hypromellose (ISOPTO TEARS / GONIOVISC) 2.5 % ophthalmic solution Place 1 drop into both eyes 3 (three) times daily as needed for dry eyes.    Marland Kitchen loperamide (IMODIUM) 2 MG capsule Take by mouth daily.    . Menthol, Topical Analgesic, (ICY HOT) 5 % PADS Apply 1 patch topically daily as needed (pain).     . pantoprazole (PROTONIX) 40 MG tablet Take 1 tablet (40 mg total) by mouth daily. 30 tablet 3  . Trastuzumab (HERCEPTIN IV) Inject into the vein. Patient will need to take until October 2020     No current facility-administered medications for this visit.     PHYSICAL EXAMINATION: ECOG PERFORMANCE STATUS: 1 - Symptomatic but completely ambulatory  Vitals:   09/06/18 0841  BP: (!) 150/66  Pulse: 67  Resp: 18  Temp: 97.8 F (36.6 C)  SpO2: 97%   Filed Weights   09/06/18 0841  Weight: 209 lb 8 oz (95 kg)    GENERAL: alert, no distress and comfortable SKIN: skin color, texture, turgor are normal, no rashes or significant lesions EYES: normal, Conjunctiva are pink and non-injected, sclera clear OROPHARYNX: no exudate, no erythema and lips, buccal mucosa, and tongue normal  NECK: supple, thyroid normal size, non-tender, without nodularity LYMPH: no palpable lymphadenopathy in the cervical, axillary or inguinal LUNGS: clear to auscultation and percussion with normal breathing effort HEART: regular rate & rhythm and no murmurs and no lower extremity edema ABDOMEN: abdomen soft, non-tender and normal bowel sounds MUSCULOSKELETAL: no cyanosis of digits and no clubbing  NEURO: alert & oriented x 3 with  fluent speech, no focal motor/sensory deficits EXTREMITIES: No lower extremity edema  LABORATORY DATA:  I have reviewed the data as listed CMP Latest Ref Rng & Units 09/06/2018 07/26/2018 06/14/2018  Glucose 70 - 99 mg/dL 119(H) 110(H) 99  BUN 8 - 23 mg/dL 23 21 21   Creatinine 0.44 - 1.00 mg/dL 0.82 0.95 0.79  Sodium 135 - 145 mmol/L 144 145 142  Potassium 3.5 - 5.1 mmol/L 3.8 3.7 3.8  Chloride 98 - 111 mmol/L 109 110 106  CO2 22 - 32 mmol/L 25 26 28   Calcium 8.9 - 10.3 mg/dL 9.0 8.8(L) 8.9  Total Protein 6.5 - 8.1 g/dL 6.4(L) 6.6 6.4(L)  Total Bilirubin 0.3 - 1.2 mg/dL 0.4 0.3 0.5  Alkaline Phos 38 - 126 U/L 93 88 94  AST 15 - 41 U/L 15 16 14(L)  ALT 0 - 44 U/L 20 17 19     Lab Results  Component Value Date   WBC 4.7 09/06/2018   HGB 12.9 09/06/2018   HCT 39.0 09/06/2018   MCV 94.9 09/06/2018   PLT 167 09/06/2018   NEUTROABS 3.1 09/06/2018    ASSESSMENT & PLAN:  Malignant neoplasm of upper-outer quadrant of left breast in female, estrogen receptor positive ([place]) 09/11/2017:Left lumpectomy: Grade 2 invasive lobular cancer, 1.1 cm, LCIS, margins negative, 0/2 lymph nodes negative, ER  90% strong staining, PR negative, HER-2 positive ratio 2.62, Ki-67 1%, T1c N0 stage Ia  Recommendation: 1.Adjuvant therapy with Taxol Herceptin weekly x12 followed by Herceptin maintenance for 1 year 2.Adjuvant radiationcompleted 03/23/2018 3.Followed by adjuvant antiestrogen therapystarted 05/03/2018 ------------------------------------------------------------------ Current treatment:Herceptin maintenance therapy every 3 weeksto be completed October 2020, antiestrogen therapy with anastrozole 1 mg daily4/20/2020 Chemo-induced peripheral neuropathy:Stable to improving Chemotherapy-induced memory loss: We talked about remember study. She may do this once treatment is complete  Anastrozoletoxicities:Occasional muscle aches and pains.  She has been taking anastrozole on and off.  Return  to clinic every 3 weeks for Herceptin every 6 weeks for follow-up with me with labs. She has 2 more doses of Herceptin and then she will complete her treatment.  I sent a request to get her port removed after October.  I will see her with her last treatment of Herceptin. No orders of the defined types were placed in this encounter.  The patient has a good understanding of the overall plan. she agrees with it. she will call with any problems that may develop before the next visit here.  Nicholas Lose, MD 09/06/2018  Julious Oka Dorshimer am acting as scribe for Dr. Nicholas Lose.  I have reviewed the above documentation for accuracy and completeness, and I agree with the above.

## 2018-09-06 ENCOUNTER — Inpatient Hospital Stay: Payer: Medicare Other

## 2018-09-06 ENCOUNTER — Inpatient Hospital Stay (HOSPITAL_BASED_OUTPATIENT_CLINIC_OR_DEPARTMENT_OTHER): Payer: Medicare Other | Admitting: Hematology and Oncology

## 2018-09-06 ENCOUNTER — Other Ambulatory Visit: Payer: Self-pay

## 2018-09-06 DIAGNOSIS — C50412 Malignant neoplasm of upper-outer quadrant of left female breast: Secondary | ICD-10-CM | POA: Diagnosis not present

## 2018-09-06 DIAGNOSIS — Z79899 Other long term (current) drug therapy: Secondary | ICD-10-CM | POA: Diagnosis not present

## 2018-09-06 DIAGNOSIS — Z95828 Presence of other vascular implants and grafts: Secondary | ICD-10-CM

## 2018-09-06 DIAGNOSIS — Z923 Personal history of irradiation: Secondary | ICD-10-CM | POA: Diagnosis not present

## 2018-09-06 DIAGNOSIS — Z170 Estrogen receptor positive status [ER+]: Secondary | ICD-10-CM

## 2018-09-06 DIAGNOSIS — Z5112 Encounter for antineoplastic immunotherapy: Secondary | ICD-10-CM | POA: Diagnosis not present

## 2018-09-06 LAB — CBC WITH DIFFERENTIAL (CANCER CENTER ONLY)
Abs Immature Granulocytes: 0.01 10*3/uL (ref 0.00–0.07)
Basophils Absolute: 0 10*3/uL (ref 0.0–0.1)
Basophils Relative: 0 %
Eosinophils Absolute: 0.1 10*3/uL (ref 0.0–0.5)
Eosinophils Relative: 3 %
HCT: 39 % (ref 36.0–46.0)
Hemoglobin: 12.9 g/dL (ref 12.0–15.0)
Immature Granulocytes: 0 %
Lymphocytes Relative: 20 %
Lymphs Abs: 0.9 10*3/uL (ref 0.7–4.0)
MCH: 31.4 pg (ref 26.0–34.0)
MCHC: 33.1 g/dL (ref 30.0–36.0)
MCV: 94.9 fL (ref 80.0–100.0)
Monocytes Absolute: 0.5 10*3/uL (ref 0.1–1.0)
Monocytes Relative: 10 %
Neutro Abs: 3.1 10*3/uL (ref 1.7–7.7)
Neutrophils Relative %: 67 %
Platelet Count: 167 10*3/uL (ref 150–400)
RBC: 4.11 MIL/uL (ref 3.87–5.11)
RDW: 12.7 % (ref 11.5–15.5)
WBC Count: 4.7 10*3/uL (ref 4.0–10.5)
nRBC: 0 % (ref 0.0–0.2)

## 2018-09-06 LAB — CMP (CANCER CENTER ONLY)
ALT: 20 U/L (ref 0–44)
AST: 15 U/L (ref 15–41)
Albumin: 3.5 g/dL (ref 3.5–5.0)
Alkaline Phosphatase: 93 U/L (ref 38–126)
Anion gap: 10 (ref 5–15)
BUN: 23 mg/dL (ref 8–23)
CO2: 25 mmol/L (ref 22–32)
Calcium: 9 mg/dL (ref 8.9–10.3)
Chloride: 109 mmol/L (ref 98–111)
Creatinine: 0.82 mg/dL (ref 0.44–1.00)
GFR, Est AFR Am: >60 mL/min (ref >60)
GFR, Estimated: >60 mL/min (ref >60)
Glucose, Bld: 119 mg/dL — ABNORMAL HIGH (ref 70–99)
Potassium: 3.8 mmol/L (ref 3.5–5.1)
Sodium: 144 mmol/L (ref 135–145)
Total Bilirubin: 0.4 mg/dL (ref 0.3–1.2)
Total Protein: 6.4 g/dL — ABNORMAL LOW (ref 6.5–8.1)

## 2018-09-06 MED ORDER — DIPHENHYDRAMINE HCL 25 MG PO CAPS
ORAL_CAPSULE | ORAL | Status: AC
  Filled 2018-09-06: qty 2

## 2018-09-06 MED ORDER — HEPARIN SOD (PORK) LOCK FLUSH 100 UNIT/ML IV SOLN
500.0000 [IU] | Freq: Once | INTRAVENOUS | Status: AC | PRN
  Administered 2018-09-06: 500 [IU]
  Filled 2018-09-06: qty 5

## 2018-09-06 MED ORDER — ACETAMINOPHEN 325 MG PO TABS
650.0000 mg | ORAL_TABLET | Freq: Once | ORAL | Status: AC
  Administered 2018-09-06: 650 mg via ORAL

## 2018-09-06 MED ORDER — SODIUM CHLORIDE 0.9% FLUSH
10.0000 mL | INTRAVENOUS | Status: DC | PRN
  Administered 2018-09-06: 10 mL
  Filled 2018-09-06: qty 10

## 2018-09-06 MED ORDER — TRASTUZUMAB CHEMO 150 MG IV SOLR
6.0000 mg/kg | Freq: Once | INTRAVENOUS | Status: AC
  Administered 2018-09-06: 567 mg via INTRAVENOUS
  Filled 2018-09-06: qty 27

## 2018-09-06 MED ORDER — SODIUM CHLORIDE 0.9% FLUSH
10.0000 mL | Freq: Once | INTRAVENOUS | Status: AC
  Administered 2018-09-06: 10 mL
  Filled 2018-09-06: qty 10

## 2018-09-06 MED ORDER — ACETAMINOPHEN 325 MG PO TABS
ORAL_TABLET | ORAL | Status: AC
  Filled 2018-09-06: qty 2

## 2018-09-06 MED ORDER — DIPHENHYDRAMINE HCL 25 MG PO CAPS
50.0000 mg | ORAL_CAPSULE | Freq: Once | ORAL | Status: AC
  Administered 2018-09-06: 25 mg via ORAL

## 2018-09-06 MED ORDER — SODIUM CHLORIDE 0.9 % IV SOLN
Freq: Once | INTRAVENOUS | Status: AC
  Administered 2018-09-06: 10:00:00 via INTRAVENOUS
  Filled 2018-09-06: qty 250

## 2018-09-06 NOTE — Patient Instructions (Signed)
[facility] Discharge Instructions for Patients Receiving Chemotherapy  Today you received the following chemotherapy agents Herceptin  To help prevent nausea and vomiting after your treatment, we encourage you to take your nausea medication as directed   If you develop nausea and vomiting that is not controlled by your nausea medication, call the clinic.   BELOW ARE SYMPTOMS THAT SHOULD BE REPORTED IMMEDIATELY:  *FEVER GREATER THAN 100.5 F  *CHILLS WITH OR WITHOUT FEVER  NAUSEA AND VOMITING THAT IS NOT CONTROLLED WITH YOUR NAUSEA MEDICATION  *UNUSUAL SHORTNESS OF BREATH  *UNUSUAL BRUISING OR BLEEDING  TENDERNESS IN MOUTH AND THROAT WITH OR WITHOUT PRESENCE OF ULCERS  *URINARY PROBLEMS  *BOWEL PROBLEMS  UNUSUAL RASH Items with * indicate a potential emergency and should be followed up as soon as possible.  Feel free to call the clinic should you have any questions or concerns. The clinic phone number is [phone].  Please show the CHEMO ALERT CARD at check-in to the Emergency Department and triage nurse.   

## 2018-09-27 ENCOUNTER — Inpatient Hospital Stay: Payer: Medicare Other | Attending: Hematology and Oncology

## 2018-09-27 ENCOUNTER — Other Ambulatory Visit: Payer: Self-pay

## 2018-09-27 VITALS — BP 156/63 | HR 71 | Temp 98.0°F | Resp 18

## 2018-09-27 DIAGNOSIS — Z5112 Encounter for antineoplastic immunotherapy: Secondary | ICD-10-CM | POA: Insufficient documentation

## 2018-09-27 DIAGNOSIS — Z170 Estrogen receptor positive status [ER+]: Secondary | ICD-10-CM | POA: Diagnosis not present

## 2018-09-27 DIAGNOSIS — C50412 Malignant neoplasm of upper-outer quadrant of left female breast: Secondary | ICD-10-CM | POA: Insufficient documentation

## 2018-09-27 DIAGNOSIS — Z79899 Other long term (current) drug therapy: Secondary | ICD-10-CM | POA: Insufficient documentation

## 2018-09-27 MED ORDER — DIPHENHYDRAMINE HCL 25 MG PO CAPS
50.0000 mg | ORAL_CAPSULE | Freq: Once | ORAL | Status: AC
  Administered 2018-09-27: 50 mg via ORAL

## 2018-09-27 MED ORDER — SODIUM CHLORIDE 0.9 % IV SOLN
Freq: Once | INTRAVENOUS | Status: AC
  Administered 2018-09-27: 10:00:00 via INTRAVENOUS
  Filled 2018-09-27: qty 250

## 2018-09-27 MED ORDER — TRASTUZUMAB CHEMO 150 MG IV SOLR
6.0000 mg/kg | Freq: Once | INTRAVENOUS | Status: AC
  Administered 2018-09-27: 567 mg via INTRAVENOUS
  Filled 2018-09-27: qty 27

## 2018-09-27 MED ORDER — SODIUM CHLORIDE 0.9% FLUSH
10.0000 mL | INTRAVENOUS | Status: DC | PRN
  Administered 2018-09-27: 10 mL
  Filled 2018-09-27: qty 10

## 2018-09-27 MED ORDER — HEPARIN SOD (PORK) LOCK FLUSH 100 UNIT/ML IV SOLN
500.0000 [IU] | Freq: Once | INTRAVENOUS | Status: AC | PRN
  Administered 2018-09-27: 500 [IU]
  Filled 2018-09-27: qty 5

## 2018-09-27 MED ORDER — ACETAMINOPHEN 325 MG PO TABS
650.0000 mg | ORAL_TABLET | Freq: Once | ORAL | Status: AC
  Administered 2018-09-27: 650 mg via ORAL

## 2018-09-27 MED ORDER — ACETAMINOPHEN 325 MG PO TABS
ORAL_TABLET | ORAL | Status: AC
  Filled 2018-09-27: qty 2

## 2018-09-27 MED ORDER — DIPHENHYDRAMINE HCL 25 MG PO CAPS
ORAL_CAPSULE | ORAL | Status: AC
  Filled 2018-09-27: qty 2

## 2018-09-27 NOTE — Patient Instructions (Signed)
[facility] Discharge Instructions for Patients Receiving Chemotherapy  Today you received the following chemotherapy agents Herceptin  To help prevent nausea and vomiting after your treatment, we encourage you to take your nausea medication as directed   If you develop nausea and vomiting that is not controlled by your nausea medication, call the clinic.   BELOW ARE SYMPTOMS THAT SHOULD BE REPORTED IMMEDIATELY:  *FEVER GREATER THAN 100.5 F  *CHILLS WITH OR WITHOUT FEVER  NAUSEA AND VOMITING THAT IS NOT CONTROLLED WITH YOUR NAUSEA MEDICATION  *UNUSUAL SHORTNESS OF BREATH  *UNUSUAL BRUISING OR BLEEDING  TENDERNESS IN MOUTH AND THROAT WITH OR WITHOUT PRESENCE OF ULCERS  *URINARY PROBLEMS  *BOWEL PROBLEMS  UNUSUAL RASH Items with * indicate a potential emergency and should be followed up as soon as possible.  Feel free to call the clinic should you have any questions or concerns. The clinic phone number is [phone].  Please show the CHEMO ALERT CARD at check-in to the Emergency Department and triage nurse.   

## 2018-10-11 ENCOUNTER — Telehealth: Payer: Self-pay

## 2018-10-11 NOTE — Telephone Encounter (Signed)
LVM asking patient to call me back to schedule her 12 month UPBEAT visit. Johney Maine RN, BSN Clinical Research

## 2018-10-14 ENCOUNTER — Encounter: Payer: Self-pay | Admitting: Hematology and Oncology

## 2018-10-14 ENCOUNTER — Telehealth (INDEPENDENT_AMBULATORY_CARE_PROVIDER_SITE_OTHER): Payer: Self-pay | Admitting: Internal Medicine

## 2018-10-14 ENCOUNTER — Telehealth: Payer: Self-pay

## 2018-10-14 NOTE — Telephone Encounter (Signed)
RN spoke with patient.  Patient was informed that she was around someone that tested positive for Covid.    Pt states the date she was around + contact was 9/25.  Upcoming infusion is scheduled for 10/18/2018.   RN informed patient we would need to push infusion back to allow for 2 weeks quarantine.  Pt voiced understanding.  RN sent scheduling message.

## 2018-10-14 NOTE — Telephone Encounter (Signed)
Patient called stated she is having uncontrollable diarrhea and wanted to ask Dr Laural Golden if there is anything she can take for this - please advise - ph# [phone]

## 2018-10-14 NOTE — Progress Notes (Signed)
Pt is asymptomatic and will notify if any symptoms develop.

## 2018-10-15 ENCOUNTER — Telehealth: Payer: Self-pay | Admitting: Hematology and Oncology

## 2018-10-15 NOTE — Telephone Encounter (Signed)
Forwarded to Dr.Rehman to address the patient's response to his recommendation.

## 2018-10-15 NOTE — Telephone Encounter (Signed)
Scheduled appt per 10/1 sch message - pt is aware of new appt date and time

## 2018-10-15 NOTE — Telephone Encounter (Signed)
Talked with Dr.Rehman. He states that the patient should take Imodium on a schedule.He should take 1 before breakfast and 1 before her lunch . She is also taking Dicyclomine twice a day. If no better by next week she will need to have stool studies.  Patient was called and made aware. She shares that she has no control over her rectum and the bowel movements are explosive. Her Oncologist has put her on Anti Estrogen that she will have to take for 7 years , she has looked this up and it causes Diarrhea. She ask is there anything else that she can take?  Dr.Rehman has been sent a message.

## 2018-10-18 ENCOUNTER — Inpatient Hospital Stay: Payer: Medicare Other

## 2018-10-18 ENCOUNTER — Inpatient Hospital Stay: Payer: Medicare Other | Admitting: Hematology and Oncology

## 2018-10-20 NOTE — Telephone Encounter (Signed)
Patient's call returned. She also has a history of lymphocytic colitis. Patient advised to take bismuth tablets 2 tablets 3 times a day for 1 month.  Patient informed that this will make her stools turn black and she should not be alarmed.  Furthermore she was advised to take other medications 2 hours before or after taking Pepto-Bismol in order to prevent interaction.  Will schedule patient for colonoscopy in November 2020.

## 2018-10-23 NOTE — Progress Notes (Signed)
Patient Care Team: Sasser, Silvestre Moment, MD as PCP - General (Unknown Physician Specialty) Excell Seltzer, MD as Consulting Physician (General Surgery) Nicholas Lose, MD as Consulting Physician (Hematology and Oncology) Gery Pray, MD as Consulting Physician (Radiation Oncology)  DIAGNOSIS:    ICD-10-CM   1. Malignant neoplasm of upper-outer quadrant of left breast in female, estrogen receptor positive ([place])  C50.412    Z17.0     SUMMARY OF ONCOLOGIC HISTORY: Oncology History  Malignant neoplasm of upper-outer quadrant of left breast in female, estrogen receptor positive ([place])  08/05/2017 Initial Diagnosis   Screening detected left breast calcifications UOQ 1.1 cm biopsy revealed invasive lobular cancer with LCIS and CSL, grade 2, ER 90%, PR 0%, Ki-67 1%, HER-2 positive ratio 2.62, T2N0 stage Ia clinical stage AJCC 8   08/12/2017 Cancer Staging   Staging form: Breast, AJCC 8th Edition - Clinical stage from 08/12/2017: Stage IA (cT1c, cN0, cM0, G2, ER+, PR-, HER2+) - Signed by Nicholas Lose, MD on 08/12/2017   09/11/2017 Surgery   Left lumpectomy: Grade 2 invasive lobular cancer, 1.1 cm, LCIS, margins negative, 0/2 lymph nodes negative, ER 90% strong staining, PR negative, HER-2 positive ratio 2.62, Ki-67 1%, T1c N0 stage Ia   09/23/2017 Cancer Staging   Staging form: Breast, AJCC 8th Edition - Pathologic: Stage IA (pT1c, pN0, cM0, G2, ER+, PR-, HER2+) - Signed by Gardenia Phlegm, NP on 09/23/2017   11/02/2017 -  Chemotherapy   Adjuvant therapy with Taxol Herceptin weekly x12 followed by Herceptin maintenance for 1 year   02/25/2018 - 03/22/2018 Radiation Therapy   Adj RT in [place]   05/03/2018 -  Anti-estrogen oral therapy   Anastrozole, 69m daily (plan for 7 years)     CHIEF COMPLIANT: Follow-up on Herceptinmaintenance and anastrozole  INTERVAL HISTORY: Sylvia MATSONis a 73y.o. with above-mentioned history of left breast cancer treated with lumpectomy,adjuvant  chemotherapy,and adjuvant radiation. She is a participant in the UpBeat clinical trial. She is currently on anti-estrogen therapy with anastrozole and isreceiving Herceptin maintenance. Shepresents to the clinic today for treatment. She has been tolerating both Herceptin and anastrozole extremely well.  She gets hot flashes from anastrozole good she is due for mammogram.  REVIEW OF SYSTEMS:   Constitutional: Denies fevers, chills or abnormal weight loss Eyes: Denies blurriness of vision Ears, nose, mouth, throat, and face: Denies mucositis or sore throat Respiratory: Denies cough, dyspnea or wheezes Cardiovascular: Denies palpitation, chest discomfort Gastrointestinal: Denies nausea, heartburn or change in bowel habits Skin: Denies abnormal skin rashes Lymphatics: Denies new lymphadenopathy or easy bruising Neurological: Denies numbness, tingling or new weaknesses Behavioral/Psych: Mood is stable, no new changes  Extremities: No lower extremity edema Breast: denies any pain or lumps or nodules in either breasts All other systems were reviewed with the patient and are negative.  I have reviewed the past medical history, past surgical history, social history and family history with the patient and they are unchanged from previous note.  ALLERGIES:  is allergic to fiorinal-codeine #3 [butalbital-asa-caff-codeine]; lansoprazole; latex; butalbital-aspirin-caffeine; sulfa antibiotics; femara [letrozole]; and sulfasalazine.  MEDICATIONS:  Current Outpatient Medications  Medication Sig Dispense Refill  . acetaminophen (TYLENOL) 325 MG tablet Take 325 mg by mouth every 6 (six) hours as needed.    .Marland Kitchenanastrozole (ARIMIDEX) 1 MG tablet Take 1 tablet (1 mg total) by mouth daily. 90 tablet 3  . atorvastatin (LIPITOR) 10 MG tablet Take 10 mg by mouth at bedtime.    . calcium carbonate (TUMS -  DOSED IN MG ELEMENTAL CALCIUM) 500 MG chewable tablet Chew 1 tablet by mouth as needed for indigestion or  heartburn.    . dicyclomine (BENTYL) 10 MG capsule Take 1 capsule (10 mg total) by mouth 3 (three) times daily as needed for spasms. Patient states that she started this 3 weeks ago (Patient taking differently: Take 10 mg by mouth 2 (two) times daily before a meal. ) 270 capsule 1  . escitalopram (LEXAPRO) 5 MG tablet Take 5 mg by mouth at bedtime.     . hydrochlorothiazide (HYDRODIURIL) 25 MG tablet Take 25 mg by mouth daily.    . hydroxypropyl methylcellulose / hypromellose (ISOPTO TEARS / GONIOVISC) 2.5 % ophthalmic solution Place 1 drop into both eyes 3 (three) times daily as needed for dry eyes.    Marland Kitchen loperamide (IMODIUM) 2 MG capsule Take by mouth daily.    . Menthol, Topical Analgesic, (ICY HOT) 5 % PADS Apply 1 patch topically daily as needed (pain).     . pantoprazole (PROTONIX) 40 MG tablet Take 1 tablet (40 mg total) by mouth daily. 30 tablet 3  . Trastuzumab (HERCEPTIN IV) Inject into the vein. Patient will need to take until October 2020     No current facility-administered medications for this visit.     PHYSICAL EXAMINATION: ECOG PERFORMANCE STATUS: 1 - Symptomatic but completely ambulatory  There were no vitals filed for this visit. There were no vitals filed for this visit.  GENERAL: alert, no distress and comfortable SKIN: skin color, texture, turgor are normal, no rashes or significant lesions EYES: normal, Conjunctiva are pink and non-injected, sclera clear OROPHARYNX: no exudate, no erythema and lips, buccal mucosa, and tongue normal  NECK: supple, thyroid normal size, non-tender, without nodularity LYMPH: no palpable lymphadenopathy in the cervical, axillary or inguinal LUNGS: clear to auscultation and percussion with normal breathing effort HEART: regular rate & rhythm and no murmurs and no lower extremity edema ABDOMEN: abdomen soft, non-tender and normal bowel sounds MUSCULOSKELETAL: no cyanosis of digits and no clubbing  NEURO: alert & oriented x 3 with fluent  speech, no focal motor/sensory deficits EXTREMITIES: No lower extremity edema  LABORATORY DATA:  I have reviewed the data as listed CMP Latest Ref Rng & Units 09/06/2018 07/26/2018 06/14/2018  Glucose 70 - 99 mg/dL 119(H) 110(H) 99  BUN 8 - 23 mg/dL 23 21 21   Creatinine 0.44 - 1.00 mg/dL 0.82 0.95 0.79  Sodium 135 - 145 mmol/L 144 145 142  Potassium 3.5 - 5.1 mmol/L 3.8 3.7 3.8  Chloride 98 - 111 mmol/L 109 110 106  CO2 22 - 32 mmol/L 25 26 28   Calcium 8.9 - 10.3 mg/dL 9.0 8.8(L) 8.9  Total Protein 6.5 - 8.1 g/dL 6.4(L) 6.6 6.4(L)  Total Bilirubin 0.3 - 1.2 mg/dL 0.4 0.3 0.5  Alkaline Phos 38 - 126 U/L 93 88 94  AST 15 - 41 U/L 15 16 14(L)  ALT 0 - 44 U/L 20 17 19     Lab Results  Component Value Date   WBC 5.8 10/25/2018   HGB 13.3 10/25/2018   HCT 39.3 10/25/2018   MCV 92.3 10/25/2018   PLT 155 10/25/2018   NEUTROABS 4.0 10/25/2018    ASSESSMENT & PLAN:  Malignant neoplasm of upper-outer quadrant of left breast in female, estrogen receptor positive ([facility]) 09/11/2017:Left lumpectomy: Grade 2 invasive lobular cancer, 1.1 cm, LCIS, margins negative, 0/2 lymph nodes negative, ER 90% strong staining, PR negative, HER-2 positive ratio 2.62, Ki-67 1%, T1c N0 stage Ia  Recommendation: 1.Adjuvant therapy with Taxol Herceptin weekly x12 followed by Herceptin maintenance for 1 year 2.Adjuvant radiationcompleted 03/23/2018 3.Followed by adjuvant antiestrogen therapystarted 05/03/2018 ------------------------------------------------------------------ Current treatment: 1. Herceptin maintenance therapy every 3 weekscompleted October 2020,  2. antiestrogen therapy with anastrozole 1 mg daily4/20/2020 Chemo-induced peripheral neuropathy:Stable to improving Chemotherapy-induced memory loss: We talked about remember study. She may do this once treatment is complete  Anastrozoletoxicities:Occasional muscle aches and pains.  She has been taking anastrozole on and off.  This  completes Herceptin therapy. Port will be removed.  Breast cancer surveillance: Patient will need to be set up for mammograms.  Patient gets her mammograms at [facility] center in [place] Return to clinic in 6 months for follow-up and after that we can see her once a year.    No orders of the defined types were placed in this encounter.  The patient has a good understanding of the overall plan. she agrees with it. she will call with any problems that may develop before the next visit here.  Nicholas Lose, MD 10/25/2018  Julious Oka Dorshimer am acting as scribe for Dr. Nicholas Lose.  I have reviewed the above documentation for accuracy and completeness, and I agree with the above.

## 2018-10-25 ENCOUNTER — Inpatient Hospital Stay: Payer: Medicare Other | Attending: Hematology and Oncology

## 2018-10-25 ENCOUNTER — Other Ambulatory Visit: Payer: Self-pay

## 2018-10-25 ENCOUNTER — Inpatient Hospital Stay: Payer: Medicare Other

## 2018-10-25 ENCOUNTER — Inpatient Hospital Stay (HOSPITAL_BASED_OUTPATIENT_CLINIC_OR_DEPARTMENT_OTHER): Payer: Medicare Other | Admitting: Hematology and Oncology

## 2018-10-25 ENCOUNTER — Encounter: Payer: Self-pay | Admitting: *Deleted

## 2018-10-25 VITALS — BP 164/70 | HR 74

## 2018-10-25 DIAGNOSIS — C50412 Malignant neoplasm of upper-outer quadrant of left female breast: Secondary | ICD-10-CM

## 2018-10-25 DIAGNOSIS — Z79899 Other long term (current) drug therapy: Secondary | ICD-10-CM | POA: Diagnosis not present

## 2018-10-25 DIAGNOSIS — Z5112 Encounter for antineoplastic immunotherapy: Secondary | ICD-10-CM | POA: Diagnosis not present

## 2018-10-25 DIAGNOSIS — Z95828 Presence of other vascular implants and grafts: Secondary | ICD-10-CM

## 2018-10-25 DIAGNOSIS — Z170 Estrogen receptor positive status [ER+]: Secondary | ICD-10-CM | POA: Diagnosis not present

## 2018-10-25 LAB — RESEARCH LABS

## 2018-10-25 LAB — CMP (CANCER CENTER ONLY)
ALT: 15 U/L (ref 0–44)
AST: 13 U/L — ABNORMAL LOW (ref 15–41)
Albumin: 3.8 g/dL (ref 3.5–5.0)
Alkaline Phosphatase: 92 U/L (ref 38–126)
Anion gap: 12 (ref 5–15)
BUN: 18 mg/dL (ref 8–23)
CO2: 28 mmol/L (ref 22–32)
Calcium: 9.4 mg/dL (ref 8.9–10.3)
Chloride: 105 mmol/L (ref 98–111)
Creatinine: 0.87 mg/dL (ref 0.44–1.00)
GFR, Est AFR Am: >60 mL/min (ref >60)
GFR, Estimated: >60 mL/min (ref >60)
Glucose, Bld: 100 mg/dL — ABNORMAL HIGH (ref 70–99)
Potassium: 3.7 mmol/L (ref 3.5–5.1)
Sodium: 145 mmol/L (ref 135–145)
Total Bilirubin: 0.5 mg/dL (ref 0.3–1.2)
Total Protein: 6.8 g/dL (ref 6.5–8.1)

## 2018-10-25 LAB — CBC WITH DIFFERENTIAL (CANCER CENTER ONLY)
Abs Immature Granulocytes: 0.01 10*3/uL (ref 0.00–0.07)
Basophils Absolute: 0 10*3/uL (ref 0.0–0.1)
Basophils Relative: 0 %
Eosinophils Absolute: 0.1 10*3/uL (ref 0.0–0.5)
Eosinophils Relative: 2 %
HCT: 39.3 % (ref 36.0–46.0)
Hemoglobin: 13.3 g/dL (ref 12.0–15.0)
Immature Granulocytes: 0 %
Lymphocytes Relative: 18 %
Lymphs Abs: 1.1 10*3/uL (ref 0.7–4.0)
MCH: 31.2 pg (ref 26.0–34.0)
MCHC: 33.8 g/dL (ref 30.0–36.0)
MCV: 92.3 fL (ref 80.0–100.0)
Monocytes Absolute: 0.6 10*3/uL (ref 0.1–1.0)
Monocytes Relative: 10 %
Neutro Abs: 4 10*3/uL (ref 1.7–7.7)
Neutrophils Relative %: 70 %
Platelet Count: 155 10*3/uL (ref 150–400)
RBC: 4.26 MIL/uL (ref 3.87–5.11)
RDW: 12.8 % (ref 11.5–15.5)
WBC Count: 5.8 10*3/uL (ref 4.0–10.5)
nRBC: 0 % (ref 0.0–0.2)

## 2018-10-25 MED ORDER — SODIUM CHLORIDE 0.9 % IV SOLN
Freq: Once | INTRAVENOUS | Status: AC
  Administered 2018-10-25: 09:00:00 via INTRAVENOUS
  Filled 2018-10-25: qty 250

## 2018-10-25 MED ORDER — SODIUM CHLORIDE 0.9% FLUSH
10.0000 mL | Freq: Once | INTRAVENOUS | Status: AC
  Administered 2018-10-25: 09:00:00 10 mL
  Filled 2018-10-25: qty 10

## 2018-10-25 MED ORDER — HEPARIN SOD (PORK) LOCK FLUSH 100 UNIT/ML IV SOLN
500.0000 [IU] | Freq: Once | INTRAVENOUS | Status: AC | PRN
  Administered 2018-10-25: 500 [IU]
  Filled 2018-10-25: qty 5

## 2018-10-25 MED ORDER — ACETAMINOPHEN 325 MG PO TABS
650.0000 mg | ORAL_TABLET | Freq: Once | ORAL | Status: AC
  Administered 2018-10-25: 650 mg via ORAL

## 2018-10-25 MED ORDER — DIPHENHYDRAMINE HCL 25 MG PO CAPS
50.0000 mg | ORAL_CAPSULE | Freq: Once | ORAL | Status: AC
  Administered 2018-10-25: 50 mg via ORAL

## 2018-10-25 MED ORDER — SODIUM CHLORIDE 0.9% FLUSH
10.0000 mL | INTRAVENOUS | Status: DC | PRN
  Administered 2018-10-25: 10 mL
  Filled 2018-10-25: qty 10

## 2018-10-25 MED ORDER — TRASTUZUMAB CHEMO 150 MG IV SOLR
6.0000 mg/kg | Freq: Once | INTRAVENOUS | Status: AC
  Administered 2018-10-25: 567 mg via INTRAVENOUS
  Filled 2018-10-25: qty 14.29

## 2018-10-25 MED ORDER — DIPHENHYDRAMINE HCL 25 MG PO CAPS
ORAL_CAPSULE | ORAL | Status: AC
  Filled 2018-10-25: qty 2

## 2018-10-25 MED ORDER — ACETAMINOPHEN 325 MG PO TABS
ORAL_TABLET | ORAL | Status: AC
  Filled 2018-10-25: qty 2

## 2018-10-25 NOTE — Assessment & Plan Note (Signed)
09/11/2017:Left lumpectomy: Grade 2 invasive lobular cancer, 1.1 cm, LCIS, margins negative, 0/2 lymph nodes negative, ER 90% strong staining, PR negative, HER-2 positive ratio 2.62, Ki-67 1%, T1c N0 stage Ia  Recommendation: 1.Adjuvant therapy with Taxol Herceptin weekly x12 followed by Herceptin maintenance for 1 year 2.Adjuvant radiationcompleted 03/23/2018 3.Followed by adjuvant antiestrogen therapystarted 05/03/2018 ------------------------------------------------------------------ Current treatment: 1. Herceptin maintenance therapy every 3 weekscompleted October 2020,  2. antiestrogen therapy with anastrozole 1 mg daily4/20/2020 Chemo-induced peripheral neuropathy:Stable to improving Chemotherapy-induced memory loss: We talked about remember study. She may do this once treatment is complete  Anastrozoletoxicities:Occasional muscle aches and pains.  She has been taking anastrozole on and off.  This completes Herceptin therapy. Port will be removed.  Breast cancer surveillance: Patient will need to be set up for mammograms. Return to clinic in 6 months for follow-up and after that we can see her once a year.

## 2018-10-25 NOTE — Telephone Encounter (Signed)
TCS sch'd 12/30/18, patient aware

## 2018-10-25 NOTE — Patient Instructions (Signed)

## 2018-10-25 NOTE — Patient Instructions (Signed)
[facility] Discharge Instructions for Patients Receiving Chemotherapy  Today you received the following chemotherapy agents Herceptin  To help prevent nausea and vomiting after your treatment, we encourage you to take your nausea medication as directed   If you develop nausea and vomiting that is not controlled by your nausea medication, call the clinic.   BELOW ARE SYMPTOMS THAT SHOULD BE REPORTED IMMEDIATELY:  *FEVER GREATER THAN 100.5 F  *CHILLS WITH OR WITHOUT FEVER  NAUSEA AND VOMITING THAT IS NOT CONTROLLED WITH YOUR NAUSEA MEDICATION  *UNUSUAL SHORTNESS OF BREATH  *UNUSUAL BRUISING OR BLEEDING  TENDERNESS IN MOUTH AND THROAT WITH OR WITHOUT PRESENCE OF ULCERS  *URINARY PROBLEMS  *BOWEL PROBLEMS  UNUSUAL RASH Items with * indicate a potential emergency and should be followed up as soon as possible.  Feel free to call the clinic should you have any questions or concerns. The clinic phone number is [phone].  Please show the CHEMO ALERT CARD at check-in to the Emergency Department and triage nurse.   

## 2018-10-25 NOTE — Progress Notes (Signed)
[id number] UPBEAT 12 month visit  Patient arrived unaccompanied for her 12 month visit.   Labs: Labs were collected per protocol. Patient confirmed she was fasting.  VS: Vital signs were collected per protocol.   PROs: Patient completed questionnaires during her infusion. This RN reviewed them for completion.  Neurocognitive Assessments: Farris Has, research assistant, administered the neurocognitive tests.  Physical Function Testing- The physical function testing was completed by this RN. The patient's waist was measured at 44.5 inches.  The patient was thanked for her ongoing participation in the UPBEAT study.  Johney Maine RN, BSN Clinical Research  10/25/18 11:48 AM

## 2018-10-26 ENCOUNTER — Telehealth: Payer: Self-pay | Admitting: Hematology and Oncology

## 2018-10-26 NOTE — Telephone Encounter (Signed)
I talk with patient regarding schedule  

## 2018-10-28 ENCOUNTER — Other Ambulatory Visit (HOSPITAL_COMMUNITY)
Admission: RE | Admit: 2018-10-28 | Discharge: 2018-10-28 | Disposition: A | Discharge status: Home / Self Care | Payer: Medicare Other | Source: Ambulatory Visit | Attending: General Surgery | Admitting: General Surgery

## 2018-10-28 DIAGNOSIS — Z01812 Encounter for preprocedural laboratory examination: Secondary | ICD-10-CM | POA: Diagnosis not present

## 2018-10-28 DIAGNOSIS — Z20828 Contact with and (suspected) exposure to other viral communicable diseases: Secondary | ICD-10-CM | POA: Insufficient documentation

## 2018-10-29 ENCOUNTER — Other Ambulatory Visit (INDEPENDENT_AMBULATORY_CARE_PROVIDER_SITE_OTHER): Payer: Self-pay | Admitting: *Deleted

## 2018-10-29 DIAGNOSIS — K52832 Lymphocytic colitis: Secondary | ICD-10-CM

## 2018-10-30 LAB — NOVEL CORONAVIRUS, NAA (HOSP ORDER, SEND-OUT TO REF LAB; TAT 18-24 HRS): SARS-CoV-2, NAA: NOT DETECTED

## 2018-11-01 DIAGNOSIS — Z452 Encounter for adjustment and management of vascular access device: Secondary | ICD-10-CM | POA: Diagnosis not present

## 2018-11-01 DIAGNOSIS — Z853 Personal history of malignant neoplasm of breast: Secondary | ICD-10-CM | POA: Diagnosis not present

## 2018-11-16 ENCOUNTER — Encounter (INDEPENDENT_AMBULATORY_CARE_PROVIDER_SITE_OTHER): Payer: Self-pay | Admitting: *Deleted

## 2018-11-16 ENCOUNTER — Other Ambulatory Visit (INDEPENDENT_AMBULATORY_CARE_PROVIDER_SITE_OTHER): Payer: Self-pay | Admitting: *Deleted

## 2018-11-16 ENCOUNTER — Telehealth (INDEPENDENT_AMBULATORY_CARE_PROVIDER_SITE_OTHER): Payer: Self-pay | Admitting: *Deleted

## 2018-11-16 DIAGNOSIS — K52832 Lymphocytic colitis: Secondary | ICD-10-CM

## 2018-11-16 MED ORDER — PEG 3350-KCL-NA BICARB-NACL 420 G PO SOLR: 4000.0000 mL | Freq: Once | ORAL | 0 refills | Status: AC

## 2018-11-16 NOTE — Telephone Encounter (Signed)
Patient needs trilyte TCS sch'd 12/17

## 2018-12-03 ENCOUNTER — Other Ambulatory Visit (HOSPITAL_COMMUNITY)
Admission: AD | Admit: 2018-12-03 | Discharge: 2018-12-03 | Disposition: A | Discharge status: Home / Self Care | Payer: Medicare Other | Source: Other Acute Inpatient Hospital | Attending: Urology | Admitting: Urology

## 2018-12-03 ENCOUNTER — Ambulatory Visit (INDEPENDENT_AMBULATORY_CARE_PROVIDER_SITE_OTHER): Payer: Medicare Other | Admitting: Urology

## 2018-12-03 DIAGNOSIS — N281 Cyst of kidney, acquired: Secondary | ICD-10-CM

## 2018-12-03 DIAGNOSIS — N3020 Other chronic cystitis without hematuria: Secondary | ICD-10-CM | POA: Insufficient documentation

## 2018-12-03 LAB — URINALYSIS, COMPLETE (UACMP) WITH MICROSCOPIC
Bilirubin Urine: NEGATIVE
Glucose, UA: NEGATIVE mg/dL
Ketones, ur: NEGATIVE mg/dL
Nitrite: POSITIVE — AB
Protein, ur: NEGATIVE mg/dL
Specific Gravity, Urine: 1.018 (ref 1.005–1.030)
pH: 5 (ref 5.0–8.0)

## 2018-12-05 LAB — URINE CULTURE: Culture: >=100000 — AB

## 2018-12-07 ENCOUNTER — Encounter (INDEPENDENT_AMBULATORY_CARE_PROVIDER_SITE_OTHER): Payer: Self-pay | Admitting: *Deleted

## 2018-12-10 ENCOUNTER — Telehealth: Payer: Self-pay | Admitting: Adult Health

## 2018-12-10 NOTE — Telephone Encounter (Signed)
Patient called to get more information about her SCP appointment.  I reviewed what the appointment entails, and the details of the appointment.  Patient verbalized understanding and will keep appointment.  Thanked me for information.  Wilber Bihari, NP

## 2018-12-20 ENCOUNTER — Other Ambulatory Visit (INDEPENDENT_AMBULATORY_CARE_PROVIDER_SITE_OTHER): Payer: Self-pay | Admitting: *Deleted

## 2018-12-20 ENCOUNTER — Encounter (INDEPENDENT_AMBULATORY_CARE_PROVIDER_SITE_OTHER): Payer: Self-pay | Admitting: *Deleted

## 2018-12-20 ENCOUNTER — Telehealth (INDEPENDENT_AMBULATORY_CARE_PROVIDER_SITE_OTHER): Payer: Self-pay | Admitting: Internal Medicine

## 2018-12-20 DIAGNOSIS — K580 Irritable bowel syndrome with diarrhea: Secondary | ICD-10-CM

## 2018-12-20 DIAGNOSIS — K52832 Lymphocytic colitis: Secondary | ICD-10-CM

## 2018-12-20 MED ORDER — DICYCLOMINE HCL 10 MG PO CAPS: 10.0000 mg | ORAL_CAPSULE | Freq: Three times a day (TID) | ORAL | 1 refills | Status: DC | PRN

## 2018-12-20 NOTE — Telephone Encounter (Signed)
Patient left voice mail message stating she needs a temporary refill on her dicyclomine - stated she had ordered it in late November and hasn't received it by mail yet - stated she is going out of town this weekend doesn't have any and has been without for about 3 days - she uses [facility] - please advise -

## 2018-12-20 NOTE — Telephone Encounter (Signed)
This has been addressed. A Rx was done but for what the mail order is filling. That is incorrect.  The pharmacy was called and the following was called [id number] 10 mg - patient is to take one by mouth three times daily as needed for spasms. 1 month and no refills.

## 2018-12-28 ENCOUNTER — Other Ambulatory Visit (HOSPITAL_COMMUNITY)
Admission: RE | Admit: 2018-12-28 | Discharge: 2018-12-28 | Disposition: A | Discharge status: Home / Self Care | Payer: Medicare Other | Source: Ambulatory Visit | Attending: Internal Medicine | Admitting: Internal Medicine

## 2018-12-28 DIAGNOSIS — Z20828 Contact with and (suspected) exposure to other viral communicable diseases: Secondary | ICD-10-CM | POA: Diagnosis not present

## 2018-12-28 DIAGNOSIS — Z01812 Encounter for preprocedural laboratory examination: Secondary | ICD-10-CM | POA: Diagnosis present

## 2018-12-29 LAB — SARS CORONAVIRUS 2 (TAT 6-24 HRS): SARS Coronavirus 2: NEGATIVE

## 2018-12-30 ENCOUNTER — Other Ambulatory Visit: Payer: Self-pay

## 2018-12-30 ENCOUNTER — Ambulatory Visit (HOSPITAL_COMMUNITY)
Admission: RE | Admit: 2018-12-30 | Discharge: 2018-12-30 | Disposition: A | Discharge status: Home / Self Care | Payer: Medicare Other | Source: Ambulatory Visit | Attending: Internal Medicine | Admitting: Internal Medicine

## 2018-12-30 ENCOUNTER — Encounter (HOSPITAL_COMMUNITY): Payer: Self-pay | Admitting: Internal Medicine

## 2018-12-30 ENCOUNTER — Encounter (HOSPITAL_COMMUNITY)
Admission: RE | Disposition: A | Discharge status: Home / Self Care | Payer: Self-pay | Source: Ambulatory Visit | Attending: Internal Medicine

## 2018-12-30 DIAGNOSIS — Z882 Allergy status to sulfonamides status: Secondary | ICD-10-CM | POA: Insufficient documentation

## 2018-12-30 DIAGNOSIS — Z91040 Latex allergy status: Secondary | ICD-10-CM | POA: Insufficient documentation

## 2018-12-30 DIAGNOSIS — Z79811 Long term (current) use of aromatase inhibitors: Secondary | ICD-10-CM | POA: Diagnosis not present

## 2018-12-30 DIAGNOSIS — D123 Benign neoplasm of transverse colon: Secondary | ICD-10-CM | POA: Insufficient documentation

## 2018-12-30 DIAGNOSIS — Z85820 Personal history of malignant melanoma of skin: Secondary | ICD-10-CM | POA: Diagnosis not present

## 2018-12-30 DIAGNOSIS — E7800 Pure hypercholesterolemia, unspecified: Secondary | ICD-10-CM | POA: Diagnosis not present

## 2018-12-30 DIAGNOSIS — C50912 Malignant neoplasm of unspecified site of left female breast: Secondary | ICD-10-CM | POA: Insufficient documentation

## 2018-12-30 DIAGNOSIS — Z888 Allergy status to other drugs, medicaments and biological substances status: Secondary | ICD-10-CM | POA: Insufficient documentation

## 2018-12-30 DIAGNOSIS — I10 Essential (primary) hypertension: Secondary | ICD-10-CM | POA: Diagnosis not present

## 2018-12-30 DIAGNOSIS — F329 Major depressive disorder, single episode, unspecified: Secondary | ICD-10-CM | POA: Insufficient documentation

## 2018-12-30 DIAGNOSIS — Z86010 Personal history of colonic polyps: Secondary | ICD-10-CM | POA: Insufficient documentation

## 2018-12-30 DIAGNOSIS — F419 Anxiety disorder, unspecified: Secondary | ICD-10-CM | POA: Diagnosis not present

## 2018-12-30 DIAGNOSIS — Z800 Family history of malignant neoplasm of digestive organs: Secondary | ICD-10-CM | POA: Insufficient documentation

## 2018-12-30 DIAGNOSIS — K589 Irritable bowel syndrome without diarrhea: Secondary | ICD-10-CM | POA: Diagnosis not present

## 2018-12-30 DIAGNOSIS — Z803 Family history of malignant neoplasm of breast: Secondary | ICD-10-CM | POA: Insufficient documentation

## 2018-12-30 DIAGNOSIS — K52832 Lymphocytic colitis: Secondary | ICD-10-CM | POA: Insufficient documentation

## 2018-12-30 DIAGNOSIS — Z1211 Encounter for screening for malignant neoplasm of colon: Secondary | ICD-10-CM | POA: Insufficient documentation

## 2018-12-30 DIAGNOSIS — M1611 Unilateral primary osteoarthritis, right hip: Secondary | ICD-10-CM | POA: Diagnosis not present

## 2018-12-30 DIAGNOSIS — Z09 Encounter for follow-up examination after completed treatment for conditions other than malignant neoplasm: Secondary | ICD-10-CM

## 2018-12-30 DIAGNOSIS — K5730 Diverticulosis of large intestine without perforation or abscess without bleeding: Secondary | ICD-10-CM | POA: Insufficient documentation

## 2018-12-30 DIAGNOSIS — Z79899 Other long term (current) drug therapy: Secondary | ICD-10-CM | POA: Diagnosis not present

## 2018-12-30 DIAGNOSIS — Z886 Allergy status to analgesic agent status: Secondary | ICD-10-CM | POA: Insufficient documentation

## 2018-12-30 DIAGNOSIS — Z885 Allergy status to narcotic agent status: Secondary | ICD-10-CM | POA: Diagnosis not present

## 2018-12-30 HISTORY — PX: POLYPECTOMY: SHX5525

## 2018-12-30 HISTORY — PX: COLONOSCOPY: SHX5424

## 2018-12-30 SURGERY — COLONOSCOPY
Anesthesia: Moderate Sedation

## 2018-12-30 MED ORDER — MEPERIDINE HCL 50 MG/ML IJ SOLN
INTRAMUSCULAR | Status: DC | PRN
  Administered 2018-12-30 (×2): 25 mg via INTRAVENOUS

## 2018-12-30 MED ORDER — SODIUM CHLORIDE 0.9 % IV SOLN
INTRAVENOUS | Status: DC
  Administered 2018-12-30: 1000 mL via INTRAVENOUS

## 2018-12-30 MED ORDER — MEPERIDINE HCL 50 MG/ML IJ SOLN
INTRAMUSCULAR | Status: AC
  Filled 2018-12-30: qty 1

## 2018-12-30 MED ORDER — MIDAZOLAM HCL 5 MG/5ML IJ SOLN
INTRAMUSCULAR | Status: AC
  Filled 2018-12-30: qty 10

## 2018-12-30 MED ORDER — MIDAZOLAM HCL 5 MG/5ML IJ SOLN
INTRAMUSCULAR | Status: DC | PRN
  Administered 2018-12-30 (×2): 2 mg via INTRAVENOUS
  Administered 2018-12-30: 1 mg via INTRAVENOUS

## 2018-12-30 MED ORDER — STERILE WATER FOR IRRIGATION IR SOLN
Status: DC | PRN
  Administered 2018-12-30: 11:00:00 1.5 mL

## 2018-12-30 NOTE — H&P (Signed)
Sylvia STARIHA is an 73 y.o. female.   Chief Complaint: Patient is here for colonoscopy. HPI: Patient is 73 year old Caucasian female who has a history of colonic adenomas and family history of CRC who is here for surveillance colonoscopy.  She has intermittent diarrhea.  Her diarrhea is felt to be due to lymphocytic colitis as well as IBS.  She uses Pepto-Bismol on as-needed basis.  She is also on dicyclomine. She denies abdominal pain or rectal bleeding. Last colonoscopy was in October 2015 removal of 2 small polyps.  One was lost and the other one was a tubular adenoma. Family history is significant for CRC in sister who had APR in her late 60s and died 95 years later at age 15 of lymphoma.  Past Medical History:  Diagnosis Date  . Anxiety    Since in her 20's  . Arthritis 2013   Right hip  . Cancer [facility]) 2004    Breast Lumpectomy  . Complication of anesthesia    Has a small throat  . Depression    Started when she was in her 59's  . Hypercholesteremia 2012  . Hypertension 2013  . IBS (irritable bowel syndrome) 2018   per GI note  . Incontinence-urinary 2012  . Lobular carcinoma in situ (LCIS) of left breast 09/08/2008  . Melanoma of back ([place]) 1994   Excision with clear margins  . Microscopic colitis 2015   Resolved 2016    Past Surgical History:  Procedure Laterality Date  . ABDOMINAL HYSTERECTOMY  2002  . APPENDECTOMY  1949  . BIOPSY  02/26/2018   Procedure: BIOPSY;  Surgeon: Rogene Houston, MD;  Location: [facility];  Service: Endoscopy;;  gastric  . BIOPSY BREAST  2010   Left  . Bladder Tack  2002  . BREAST LUMPECTOMY  2004   Left  . BREAST LUMPECTOMY  2004   Left  . BREAST LUMPECTOMY  2006   Left  . BREAST LUMPECTOMY  2000   Left  . BREAST LUMPECTOMY WITH RADIOACTIVE SEED AND SENTINEL LYMPH NODE BIOPSY Left 09/11/2017   Procedure: LEFT BREAST LUMPECTOMY X2 WITH RADIOACTIVE SEED AND LEFT AXILLARY SENTINEL LYMPH NODE BIOPSY;  Surgeon: Excell Seltzer,  MD;  Location: [facility];  Service: General;  Laterality: Left;  . BUNIONECTOMY  1994   Right  . CATARACT EXTRACTION W/PHACO Left 08/18/2013   Procedure: CATARACT EXTRACTION PHACO AND INTRAOCULAR LENS PLACEMENT (IOC);  Surgeon: Tonny Branch, MD;  Location: [facility];  Service: Ophthalmology;  Laterality: Left;  CDE: 8.62  . COLONOSCOPY N/A 11/10/2013   Procedure: COLONOSCOPY;  Surgeon: Rogene Houston, MD;  [facility];  Service: Endoscopy;  Laterality: N/A;  240  . ESOPHAGOGASTRODUODENOSCOPY N/A 02/26/2018   Procedure: ESOPHAGOGASTRODUODENOSCOPY (EGD);  Surgeon: Rogene Houston, MD;  [facility];  Service: Endoscopy;  Laterality: N/A;  1055  . EYE SURGERY  2011   Catarct Right  . EYE SURGERY  08-2011   Right Yag procedure  . JOINT REPLACEMENT  08-2010   Left total knee  . KNEE ARTHROSCOPY  2010   Right  . KNEE ARTHROSCOPY  2003   Left  . PORTACATH PLACEMENT N/A 09/11/2017   Procedure: INSERTION PORT-A-CATH;  Surgeon: Excell Seltzer, MD;  Location: [facility];  Service: General;  Laterality: N/A;  . TOTAL KNEE ARTHROPLASTY  11/03/2011   Procedure: TOTAL KNEE ARTHROPLASTY;  Surgeon: Gearlean Alf, MD;  Location: [facility];  Service: Orthopedics;  Laterality: Right;  . TUBAL LIGATION    .  WRIST SURGERY Bilateral 03-2010  . WRIST SURGERY  2008   Right    Family History  Problem Relation Age of Onset  . Breast cancer Sister 61  . Lymphoma Maternal Aunt    Social History:  reports that she has never smoked. She has never used smokeless tobacco. She reports that she does not drink alcohol or use drugs.  Allergies:  Allergies  Allergen Reactions  . Fiorinal-Codeine #3 [Butalbital-Asa-Caff-Codeine] Rash  . Lansoprazole Swelling  . Latex Swelling, Other (See Comments), Itching and Rash    blisters  . Butalbital-Aspirin-Caffeine Rash  . Sulfa Antibiotics Rash  . Femara [Letrozole] Rash    Rash when tried in 1984  . Sulfasalazine Rash    Medications Prior to Admission   Medication Sig Dispense Refill  . acetaminophen (TYLENOL) 325 MG tablet Take 325 mg by mouth every 6 (six) hours as needed (pain).     Marland Kitchen anastrozole (ARIMIDEX) 1 MG tablet Take 1 tablet (1 mg total) by mouth daily. 90 tablet 3  . atorvastatin (LIPITOR) 10 MG tablet Take 10 mg by mouth at bedtime.    . bismuth subsalicylate (PEPTO BISMOL) 262 MG/15ML suspension Take 30 mLs by mouth every 6 (six) hours as needed for indigestion.    . dicyclomine (BENTYL) 10 MG capsule Take 1 capsule (10 mg total) by mouth 3 (three) times daily as needed for spasms. Patient states that she started this 3 weeks ago (Patient taking differently: Take 10 mg by mouth 2 (two) times daily. ) 270 capsule 1  . escitalopram (LEXAPRO) 5 MG tablet Take 5 mg by mouth at bedtime.     . hydrochlorothiazide (HYDRODIURIL) 25 MG tablet Take 25 mg by mouth daily.    Marland Kitchen loperamide (IMODIUM) 2 MG capsule Take 2 mg by mouth as needed for diarrhea or loose stools.     . Menthol, Topical Analgesic, (ICY HOT) 5 % PADS Apply 1 patch topically daily as needed (pain).     . hydroxypropyl methylcellulose / hypromellose (ISOPTO TEARS / GONIOVISC) 2.5 % ophthalmic solution Place 1 drop into both eyes 3 (three) times daily as needed for dry eyes.      No results found for this or any previous visit (from the past 48 hour(s)). No results found.  Review of Systems  Blood pressure (!) 157/67, pulse 61, temperature 98 F (36.7 C), temperature source Oral, resp. rate 15, height 5\' 5"  (1.651 m), weight 97.5 kg, SpO2 100 %. Physical Exam  Constitutional: She appears well-developed and well-nourished.  HENT:  Mouth/Throat: Oropharynx is clear and moist.  Eyes: Conjunctivae are normal. No scleral icterus.  Neck: No thyromegaly present.  Cardiovascular: Normal rate, regular rhythm and normal heart sounds.  No murmur heard. Respiratory: Effort normal and breath sounds normal.  GI:  Abdomen is full.  She has appendectomy scar.  She has right  inguinal hernia which is reducible.  Organomegaly or masses.  Musculoskeletal:        General: No edema.  Lymphadenopathy:    She has no cervical adenopathy.  Neurological: She is alert.  Skin: Skin is warm and dry.     Assessment/Plan History of colonic adenomas. Family history of colon carcinoma in sister younger than 84. Surveillance colonoscopy.  Hildred Laser, MD 12/30/2018, 10:52 AM

## 2018-12-30 NOTE — Op Note (Signed)
[facility] Patient Name: Sylvia Barnett Procedure Date: 12/30/2018 10:32 AM MRN: [id number] Date of Birth: [birth year] Attending MD: Hildred Laser , MD CSN: [id number] Age: 73 Admit Type: Outpatient Procedure:                Colonoscopy Indications:              High risk colon cancer surveillance: Personal                            history of colonic polyps, Family history of colon                            cancer in a first-degree relative before age 5                            years Providers:                Hildred Laser, MD, Charlsie Quest. Theda Sers RN, RN, Nelma Rothman, Technician Referring MD:             Manon Hilding, MD Medicines:                Meperidine 50 mg IV, Midazolam 5 mg IV Complications:            No immediate complications. Estimated Blood Loss:     Estimated blood loss was minimal. Procedure:                Pre-Anesthesia Assessment:                           - Prior to the procedure, a History and Physical                            was performed, and patient medications and                            allergies were reviewed. The patient's tolerance of                            previous anesthesia was also reviewed. The risks                            and benefits of the procedure and the sedation                            options and risks were discussed with the patient.                            All questions were answered, and informed consent                            was obtained. Prior Anticoagulants: The patient has  taken no previous anticoagulant or antiplatelet                            agents. ASA Grade Assessment: II - A patient with                            mild systemic disease. After reviewing the risks                            and benefits, the patient was deemed in                            satisfactory condition to undergo the procedure.                           After  obtaining informed consent, the colonoscope                            was passed under direct vision. Throughout the                            procedure, the patient's blood pressure, pulse, and                            oxygen saturations were monitored continuously. The                            [id number]-[id number] scope was introduced through                            the anus and advanced to the the cecum, identified                            by appendiceal orifice and ileocecal valve. The                            colonoscopy was performed without difficulty. The                            patient tolerated the procedure well. The quality                            of the bowel preparation was excellent. The                            ileocecal valve, appendiceal orifice, and rectum                            were photographed. Scope In: 11:05:08 AM Scope Out: 11:22:39 AM Scope Withdrawal Time: 0 hours 11 minutes 17 seconds  Total Procedure Duration: 0 hours 17 minutes 31 seconds  Findings:      The perianal and digital rectal examinations were normal.      Two polyps were found in the transverse colon and hepatic  flexure. The       polyps were 5 to 6 mm in size. These polyps were removed with a cold       snare. Resection and retrieval were complete. The pathology specimen was       placed into Bottle Number 1.      Scattered medium-mouthed diverticula were found in the sigmoid colon.      The retroflexed view of the distal rectum and anal verge was normal and       showed no anal or rectal abnormalities. Impression:               - Two 5 to 6 mm polyps in the transverse colon and                            at the hepatic flexure, removed with a cold snare.                            Resected and retrieved.                           - Diverticulosis in the sigmoid colon. Moderate Sedation:      Moderate (conscious) sedation was administered by the endoscopy nurse        and supervised by the endoscopist. The following parameters were       monitored: oxygen saturation, heart rate, blood pressure, CO2       capnography and response to care. Total physician intraservice time was       22 minutes. Recommendation:           - Patient has a contact number available for                            emergencies. The signs and symptoms of potential                            delayed complications were discussed with the                            patient. Return to normal activities tomorrow.                            Written discharge instructions were provided to the                            patient.                           - High fiber diet today.                           - Continue present medications.                           - No aspirin, ibuprofen, naproxen, or other                            non-steroidal anti-inflammatory drugs for 1 day.                           -  Await pathology results.                           - Repeat colonoscopy is recommended. The                            colonoscopy date will be determined after pathology                            results from today's exam become available for                            review. Procedure Code(s):        --- Professional ---                           [id number], Colonoscopy, flexible; with removal of                            tumor(s), polyp(s), or other lesion(s) by snare                            technique                           G0500, Moderate sedation services provided by the                            same physician or other qualified health care                            professional performing a gastrointestinal                            endoscopic service that sedation supports,                            requiring the presence of an independent trained                            observer to assist in the monitoring of the                            patient's level of  consciousness and physiological                            status; initial 15 minutes of intra-service time;                            patient age 37 years or older (additional time may                            be reported with [phone], as appropriate) Diagnosis Code(s):        --- Professional ---  Z86.010, Personal history of colonic polyps                           K63.5, Polyp of colon                           Z80.0, Family history of malignant neoplasm of                            digestive organs                           K57.30, Diverticulosis of large intestine without                            perforation or abscess without bleeding CPT copyright 2019 [employer]. All rights reserved. The codes documented in this report are preliminary and upon coder review may  be revised to meet current compliance requirements. Hildred Laser, MD Hildred Laser, MD 12/30/2018 11:30:57 AM This report has been signed electronically. Number of Addenda: 0

## 2018-12-30 NOTE — Discharge Instructions (Signed)
No aspirin or NSAIDs for 24 hours. Resume usual medications as before. High-fiber diet. No driving for 24 hours. Physician will call with biopsy results.   PATIENT INSTRUCTIONS POST-ANESTHESIA  IMMEDIATELY FOLLOWING SURGERY:  Do not drive or operate machinery for the first twenty four hours after surgery.  Do not make any important decisions for twenty four hours after surgery or while taking narcotic pain medications or sedatives.  If you develop intractable nausea and vomiting or a severe headache please notify your doctor immediately.  FOLLOW-UP:  Please make an appointment with your surgeon as instructed. You do not need to follow up with anesthesia unless specifically instructed to do so.  WOUND CARE INSTRUCTIONS (if applicable):  Keep a dry clean dressing on the anesthesia/puncture wound site if there is drainage.  Once the wound has quit draining you may leave it open to air.  Generally you should leave the bandage intact for twenty four hours unless there is drainage.  If the epidural site drains for more than 36-48 hours please call the anesthesia department.  QUESTIONS?:  Please feel free to call your physician or the hospital operator if you have any questions, and they will be happy to assist you.         Colonoscopy, Adult, Care After This sheet gives you information about how to care for yourself after your procedure. Your doctor may also give you more specific instructions. If you have problems or questions, call your doctor. What can I expect after the procedure? After the procedure, it is common to have:  A small amount of blood in your poop for 24 hours.  Some gas.  Mild cramping or bloating in your belly. Follow these instructions at home: General instructions  For the first 24 hours after the procedure: ? Do not drive or use machinery. ? Do not sign important documents. ? Do not drink alcohol. ? Do your daily activities more slowly than normal. ? Eat foods  that are soft and easy to digest.  Take over-the-counter or prescription medicines only as told by your doctor. To help cramping and bloating:   Try walking around.  Put heat on your belly (abdomen) as told by your doctor. Use a heat source that your doctor recommends, such as a moist heat pack or a heating pad. ? Put a towel between your skin and the heat source. ? Leave the heat on for 20-30 minutes. ? Remove the heat if your skin turns bright red. This is especially important if you cannot feel pain, heat, or cold. You can get burned. Eating and drinking   Drink enough fluid to keep your pee (urine) clear or pale yellow.  Return to your normal diet as told by your doctor. Avoid heavy or fried foods that are hard to digest.  Avoid drinking alcohol for as long as told by your doctor. Contact a doctor if:  You have blood in your poop (stool) 2-3 days after the procedure. Get help right away if:  You have more than a small amount of blood in your poop.  You see large clumps of tissue (blood clots) in your poop.  Your belly is swollen.  You feel sick to your stomach (nauseous).  You throw up (vomit).  You have a fever.  You have belly pain that gets worse, and medicine does not help your pain. Summary  After the procedure, it is common to have a small amount of blood in your poop. You may also have mild cramping  and bloating in your belly.  For the first 24 hours after the procedure, do not drive or use machinery, do not sign important documents, and do not drink alcohol.  Get help right away if you have a lot of blood in your poop, feel sick to your stomach, have a fever, or have more belly pain.     High-Fiber Diet Fiber, also called dietary fiber, is a type of carbohydrate that is found in fruits, vegetables, whole grains, and beans. A high-fiber diet can have many health benefits. Your health care provider may recommend a high-fiber diet to help:  Prevent  constipation. Fiber can make your bowel movements more regular.  Lower your cholesterol.  Relieve the following conditions: ? Swelling of veins in the anus (hemorrhoids). ? Swelling and irritation (inflammation) of specific areas of the digestive tract (uncomplicated diverticulosis). ? A problem of the large intestine (colon) that sometimes causes pain and diarrhea (irritable bowel syndrome, IBS).  Prevent overeating as part of a weight-loss plan.  Prevent heart disease, type 2 diabetes, and certain cancers. What is my plan? The recommended daily fiber intake in grams (g) includes:  38 g for men age 33 or younger.  30 g for men over age 51.  64 g for women age 25 or younger.  21 g for women over age 54. You can get the recommended daily intake of dietary fiber by:  Eating a variety of fruits, vegetables, grains, and beans.  Taking a fiber supplement, if it is not possible to get enough fiber through your diet. What do I need to know about a high-fiber diet?  It is better to get fiber through food sources rather than from fiber supplements. There is not a lot of research about how effective supplements are.  Always check the fiber content on the nutrition facts label of any prepackaged food. Look for foods that contain 5 g of fiber or more per serving.  Talk with a diet and nutrition specialist (dietitian) if you have questions about specific foods that are recommended or not recommended for your medical condition, especially if those foods are not listed below.  Gradually increase how much fiber you consume. If you increase your intake of dietary fiber too quickly, you may have bloating, cramping, or gas.  Drink plenty of water. Water helps you to digest fiber. What are tips for following this plan?  Eat a wide variety of high-fiber foods.  Make sure that half of the grains that you eat each day are whole grains.  Eat breads and cereals that are made with whole-grain flour  instead of refined flour or white flour.  Eat brown rice, bulgur wheat, or millet instead of white rice.  Start the day with a breakfast that is high in fiber, such as a cereal that contains 5 g of fiber or more per serving.  Use beans in place of meat in soups, salads, and pasta dishes.  Eat high-fiber snacks, such as berries, raw vegetables, nuts, and popcorn.  Choose whole fruits and vegetables instead of processed forms like juice or sauce. What foods can I eat?  Fruits Berries. Pears. Apples. Oranges. Avocado. Prunes and raisins. Dried figs. Vegetables Sweet potatoes. Spinach. Kale. Artichokes. Cabbage. Broccoli. Cauliflower. Green peas. Carrots. Squash. Grains Whole-grain breads. Multigrain cereal. Oats and oatmeal. Brown rice. Barley. Bulgur wheat. [place]. Quinoa. Bran muffins. Popcorn. Rye wafer crackers. Meats and other proteins Navy, kidney, and pinto beans. Soybeans. Split peas. Lentils. Nuts and seeds. Dairy Fiber-fortified yogurt.  Beverages Fiber-fortified soy milk. Fiber-fortified orange juice. Other foods Fiber bars. The items listed above may not be a complete list of recommended foods and beverages. Contact a dietitian for more options. What foods are not recommended? Fruits Fruit juice. Cooked, strained fruit. Vegetables Fried potatoes. Canned vegetables. Well-cooked vegetables. Grains White bread. Pasta made with refined flour. White rice. Meats and other proteins Fatty cuts of meat. Fried chicken or fried fish. Dairy Milk. Yogurt. Cream cheese. Sour cream. Fats and oils Butters. Beverages Soft drinks. Other foods Cakes and pastries. The items listed above may not be a complete list of foods and beverages to avoid. Contact a dietitian for more information. Summary  Fiber is a type of carbohydrate. It is found in fruits, vegetables, whole grains, and beans.  There are many health benefits of eating a high-fiber diet, such as preventing constipation,  lowering blood cholesterol, helping with weight loss, and reducing your risk of heart disease, diabetes, and certain cancers.  Gradually increase your intake of fiber. Increasing too fast can result in cramping, bloating, and gas. Drink plenty of water while you increase your fiber.  The best sources of fiber include whole fruits and vegetables, whole grains, nuts, seeds, and beans. This information is not intended to replace advice given to you by your health care provider. Make sure you discuss any questions you have with your health care provider. Document Released: 12/30/2004 Document Revised: 11/03/2016 Document Reviewed: 11/03/2016 Elsevier Patient Education  [place]. This information is not intended to replace advice given to you by your health care provider. Make sure you discuss any questions you have with your health care provider. Document Released: 02/01/2010 Document Revised: 10/30/2016 Document Reviewed: 09/24/2015 Elsevier Patient Education  2020 [employer].

## 2018-12-31 LAB — SURGICAL PATHOLOGY

## 2019-01-26 ENCOUNTER — Telehealth: Payer: Self-pay

## 2019-01-26 DIAGNOSIS — Z923 Personal history of irradiation: Secondary | ICD-10-CM | POA: Diagnosis not present

## 2019-01-26 DIAGNOSIS — Z79810 Long term (current) use of selective estrogen receptor modulators (SERMs): Secondary | ICD-10-CM | POA: Diagnosis not present

## 2019-01-26 DIAGNOSIS — Z08 Encounter for follow-up examination after completed treatment for malignant neoplasm: Secondary | ICD-10-CM | POA: Diagnosis not present

## 2019-01-26 DIAGNOSIS — Z853 Personal history of malignant neoplasm of breast: Secondary | ICD-10-CM | POA: Diagnosis not present

## 2019-01-26 NOTE — Telephone Encounter (Signed)
Left vm for patient offering patient a phone/virtual visit if she does not want to come to center for appt.

## 2019-01-27 ENCOUNTER — Telehealth: Payer: Self-pay

## 2019-01-27 ENCOUNTER — Inpatient Hospital Stay: Payer: Medicare Other | Attending: Hematology and Oncology | Admitting: Adult Health

## 2019-01-27 DIAGNOSIS — C50412 Malignant neoplasm of upper-outer quadrant of left female breast: Secondary | ICD-10-CM | POA: Diagnosis not present

## 2019-01-27 DIAGNOSIS — Z170 Estrogen receptor positive status [ER+]: Secondary | ICD-10-CM

## 2019-01-27 NOTE — Progress Notes (Signed)
SURVIVORSHIP VIRTUAL VISIT:  I connected with Sylvia Barnett on 01/27/19 at  3:30 PM EST by telephone and verified that I am speaking with the correct person using two identifiers.  I discussed the limitations, risks, security and privacy concerns of performing an evaluation and management service by telephone and the availability of in person appointments. I also discussed with the patient that there may be a patient responsible charge related to this service. The patient expressed understanding and agreed to proceed.   BRIEF ONCOLOGIC HISTORY:  Oncology History  Malignant neoplasm of upper-outer quadrant of left breast in female, estrogen receptor positive (Sylvia Barnett)  08/05/2017 Initial Diagnosis   Screening detected left breast calcifications UOQ 1.1 cm biopsy revealed invasive lobular cancer with LCIS and CSL, grade 2, ER 90%, PR 0%, Ki-67 1%, HER-2 positive ratio 2.62, T2N0 stage Ia clinical stage AJCC 8   08/12/2017 Cancer Staging   Staging form: Breast, AJCC 8th Edition - Clinical stage from 08/12/2017: Stage IA (cT1c, cN0, cM0, G2, ER+, PR-, HER2+) - Signed by Sylvia Lose, MD on 08/12/2017   09/11/2017 Surgery   Left lumpectomy: Grade 2 invasive lobular cancer, 1.1 cm, LCIS, margins negative, 0/2 lymph nodes negative, ER 90% strong staining, PR negative, HER-2 positive ratio 2.62, Ki-67 1%, T1c N0 stage Ia   09/23/2017 Cancer Staging   Staging form: Breast, AJCC 8th Edition - Pathologic: Stage IA (pT1c, pN0, cM0, G2, ER+, PR-, HER2+) - Signed by Sylvia Phlegm, NP on 09/23/2017   11/02/2017 -  Chemotherapy   Adjuvant therapy with Taxol Herceptin weekly x12 followed by Herceptin maintenance for 1 year   02/25/2018 - 03/23/2018 Radiation Therapy   Adjuvant Radiation in Syl[place] Barnett, [place] Sylvia Barnett).  Left Breast 42.56 Gy   05/03/2018 -  Anti-estrogen oral therapy   Anastrozole, 33m daily (plan for 7 years)     INTERVAL HISTORY:  [name] review her survivorship care plan  detailing her treatment course for breast cancer, as well as monitoring long-term side effects of that treatment, education regarding health maintenance, screening, and overall wellness and health promotion.     [name] had some difficulty with hot flashes with Anastrozole.  She has had difficulty with taking the anastrozole due to these hot flashes.  She had some shoulder and back pain that started in December of this year.  She thinks this might be related to a UTI.  She is f/u with her urologist next month.    REVIEW OF SYSTEMS:  Review of Systems  Constitutional: Negative for appetite change, chills, fatigue and unexpected weight change.  HENT:   Negative for hearing loss, lump/mass and sore throat.   Eyes: Negative for eye problems and icterus.  Respiratory: Negative for chest tightness, cough and shortness of breath.   Cardiovascular: Negative for chest pain, leg swelling and palpitations.  Gastrointestinal: Negative for abdominal distention, abdominal pain, constipation, diarrhea, nausea and vomiting.  Endocrine: Positive for hot flashes.  Genitourinary: Negative for difficulty urinating.   Musculoskeletal: Positive for back pain. Negative for arthralgias.  Skin: Negative for itching and rash.  Neurological: Negative for dizziness, extremity weakness, headaches and numbness.  Hematological: Negative for adenopathy. Does not bruise/bleed easily.  Psychiatric/Behavioral: Negative for depression. The patient is not nervous/anxious.    Breast: Denies any new nodularity, masses, tenderness, nipple changes, or nipple discharge.      ONCOLOGY TREATMENT TEAM:  1. Surgeon:  [facility]cell [facility] 2. Medical Oncologist: Dr. GLindi Adie 3. Radiation Oncologist: Dr. KSondra Come  PAST MEDICAL/SURGICAL HISTORY:  Past Medical History:  Diagnosis Date  . Anxiety    Since in her 20's  . Arthritis 2013   Right hip  . Cancer [facility]) 2004    Breast Lumpectomy  .  Complication of anesthesia    Has a small throat  . Depression    Started when she was in her 53's  . Hypercholesteremia 2012  . Hypertension 2013  . IBS (irritable bowel syndrome) 2018   per GI note  . Incontinence-urinary 2012  . Lobular carcinoma in situ (LCIS) of left breast 09/08/2008  . Melanoma of back (Sylvia Barnett) 1994   Excision with clear margins  . Microscopic colitis 2015   Resolved 2016   Past Surgical History:  Procedure Laterality Date  . ABDOMINAL HYSTERECTOMY  2002  . APPENDECTOMY  1949  . BIOPSY  02/26/2018   Procedure: BIOPSY;  Surgeon: Rogene Houston, MD;  [facility];  Service: Endoscopy;;  gastric  . BIOPSY BREAST  2010   Left  . Bladder Tack  2002  . BREAST LUMPECTOMY  2004   Left  . BREAST LUMPECTOMY  2004   Left  . BREAST LUMPECTOMY  2006   Left  . BREAST LUMPECTOMY  2000   Left  . BREAST LUMPECTOMY WITH RADIOACTIVE SEED AND SENTINEL LYMPH NODE BIOPSY Left 09/11/2017   Procedure: LEFT BREAST LUMPECTOMY X2 WITH RADIOACTIVE SEED AND LEFT AXILLARY SENTINEL LYMPH NODE BIOPSY;  Surgeon: Excell Seltzer, MD;  Location: [place];  Service: General;  Laterality: Left;  . BUNIONECTOMY  1994   Right  . CATARACT EXTRACTION W/PHACO Left 08/18/2013   Procedure: CATARACT EXTRACTION PHACO AND INTRAOCULAR LENS PLACEMENT (IOC);  Surgeon: Tonny Branch, MD;  Location: [facility];  Service: Ophthalmology;  Laterality: Left;  CDE: 8.62  . COLONOSCOPY N/A 11/10/2013   Procedure: COLONOSCOPY;  Surgeon: Rogene Houston, MD;  [facility];  Service: Endoscopy;  Laterality: N/A;  240  . COLONOSCOPY N/A 12/30/2018   Procedure: COLONOSCOPY;  Surgeon: Rogene Houston, MD;  [facility];  Service: Endoscopy;  Laterality: N/A;  12  . ESOPHAGOGASTRODUODENOSCOPY N/A 02/26/2018   Procedure: ESOPHAGOGASTRODUODENOSCOPY (EGD);  Surgeon: Rogene Houston, MD;  [facility];  Service: Endoscopy;  Laterality: N/A;  1055  . EYE SURGERY  2011   Catarct Right   . EYE SURGERY  08-2011   Right Yag procedure  . JOINT REPLACEMENT  08-2010   Left total knee  . KNEE ARTHROSCOPY  2010   Right  . KNEE ARTHROSCOPY  2003   Left  . POLYPECTOMY  12/30/2018   Procedure: POLYPECTOMY;  Surgeon: Rogene Houston, MD;  [facility];  Service: Endoscopy;;  hepatic flexure,transverse colon    . PORTACATH PLACEMENT N/A 09/11/2017   Procedure: INSERTION PORT-A-CATH;  Surgeon: Excell Seltzer, MD;  Location: [place];  Service: General;  Laterality: N/A;  . TOTAL KNEE ARTHROPLASTY  11/03/2011   Procedure: TOTAL KNEE ARTHROPLASTY;  Surgeon: Gearlean Alf, MD;  Location: [facility];  Service: Orthopedics;  Laterality: Right;  . TUBAL LIGATION    . WRIST SURGERY Bilateral 03-2010  . WRIST SURGERY  2008   Right     ALLERGIES:  Allergies  Allergen Reactions  . Fiorinal-Codeine #3 [Butalbital-Asa-Caff-Codeine] Rash  . Lansoprazole Swelling  . Latex Swelling, Other (See Comments), Itching and Rash    blisters  . Butalbital-Aspirin-Caffeine Rash  . Sulfa Antibiotics Rash  . Femara [Letrozole] Rash    Rash  when tried in 1984  . Sulfasalazine Rash     CURRENT MEDICATIONS:  Outpatient Encounter Medications as of 01/27/2019  Medication Sig  . acetaminophen (TYLENOL) 325 MG tablet Take 325 mg by mouth every 6 (six) hours as needed (pain).   Marland Kitchen anastrozole (ARIMIDEX) 1 MG tablet Take 1 tablet (1 mg total) by mouth daily.  Marland Kitchen atorvastatin (LIPITOR) 10 MG tablet Take 10 mg by mouth at bedtime.  . bismuth subsalicylate (PEPTO BISMOL) 262 MG/15ML suspension Take 30 mLs by mouth every 6 (six) hours as needed for indigestion.  . dicyclomine (BENTYL) 10 MG capsule Take 1 capsule (10 mg total) by mouth 3 (three) times daily as needed for spasms. Patient states that she started this 3 weeks ago (Patient taking differently: Take 10 mg by mouth 2 (two) times daily. )  . escitalopram (LEXAPRO) 5 MG tablet Take 5 mg by mouth at bedtime.   . hydrochlorothiazide  (HYDRODIURIL) 25 MG tablet Take 25 mg by mouth daily.  . hydroxypropyl methylcellulose / hypromellose (ISOPTO TEARS / GONIOVISC) 2.5 % ophthalmic solution Place 1 drop into both eyes 3 (three) times daily as needed for dry eyes.  Marland Kitchen loperamide (IMODIUM) 2 MG capsule Take 2 mg by mouth as needed for diarrhea or loose stools.   . Menthol, Topical Analgesic, (ICY HOT) 5 % PADS Apply 1 patch topically daily as needed (pain).   . [DISCONTINUED] prochlorperazine (COMPAZINE) 10 MG tablet Take 1 tablet (10 mg total) by mouth every 6 (six) hours as needed (Nausea or vomiting).   No facility-administered encounter medications on file as of 01/27/2019.     ONCOLOGIC FAMILY HISTORY:  Family History  Problem Relation Age of Onset  . Breast cancer Sister 28  . Lymphoma Maternal Aunt      GENETIC COUNSELING/TESTING: Not at this time  SOCIAL HISTORY:  Social History   Socioeconomic History  . Marital status: Married    Spouse name: Not on file  . Number of children: Not on file  . Years of education: Not on file  . Highest education level: Not on file  Occupational History  . Not on file  Tobacco Use  . Smoking status: Never Smoker  . Smokeless tobacco: Never Used  Substance and Sexual Activity  . Alcohol use: No    Alcohol/week: 0.0 standard drinks  . Drug use: No  . Sexual activity: Yes    Birth control/protection: Surgical  Other Topics Concern  . Not on file  Social History Narrative  . Not on file   Social Determinants of Health   Financial Resource Strain:   . Difficulty of Paying Living Expenses: Not on file  Food Insecurity:   . Worried About Charity fundraiser in the Last Year: Not on file  . Ran Out of Food in the Last Year: Not on file  Transportation Needs:   . Lack of Transportation (Medical): Not on file  . Lack of Transportation (Non-Medical): Not on file  Physical Activity:   . Days of Exercise per Week: Not on file  . Minutes of Exercise per Session: Not on  file  Stress:   . Feeling of Stress : Not on file  Social Connections:   . Frequency of Communication with Friends and Family: Not on file  . Frequency of Social Gatherings with Friends and Family: Not on file  . Attends Religious Services: Not on file  . Active Member of Clubs or Organizations: Not on file  . Attends Archivist  Meetings: Not on file  . Marital Status: Not on file  Intimate Partner Violence:   . Fear of Current or Ex-Partner: Not on file  . Emotionally Abused: Not on file  . Physically Abused: Not on file  . Sexually Abused: Not on file     OBSERVATIONS/OBJECTIVE:  Patient sounds well.  In no apparent distress.  Mood and behavior are normal.  Speech is normal.    LABORATORY DATA:  None for this visit.  DIAGNOSTIC IMAGING:  None for this visit.      ASSESSMENT AND PLAN:  Ms.. Jewel is a pleasant 74 y.o. female with Stage IA left breast invasive lobular carcinoma, ER+/PR-/HER2+, diagnosed in 07/2017, treated with lumpectomy, adjuvant chemotherapy, maintenance Herceptin, and adjuvant radiation therapy, and difficulty tolerating anti-estrogen therapy with Anastrozole.  She presents to the Survivorship Clinic for our initial meeting and routine follow-up post-completion of treatment for breast cancer.    1. Stage IA left breast cancer:  Ms. Weddington is continuing to recover from definitive treatment for breast cancer. She will follow-up with her medical oncologist, Dr. Lindi Adie in 04/2019 with history and physical exam per surveillance protocol. We discussed anti estrogen therapy and the risks of not taking it.  We also discussed other options of anti estrogen therapies.    We sent orders to [facility] for her mammogram to be completed later this month. Today, a comprehensive survivorship care plan and treatment summary was reviewed with the patient today detailing her breast cancer diagnosis, treatment course, potential late/long-term effects of treatment,  appropriate follow-up care with recommendations for the future, and patient education resources.  A copy of this summary, along with a letter will be sent to the patient's primary care provider via mail/fax/In Basket message after today's visit.    2. Back pain/? UTI: I recommended she have this evaluated prior to her urology appt.    3. Bone health:  Given Ms. Shinault age/history of breast cancer and treatment including anti-estrogen therapy with Anastrozole, she is at risk for bone demineralization.  She has bone density testing with Dr. Quintin Alto and will continue to f/u with him about this.  She was given education on specific activities to promote bone health.  4. Cancer screening:  Due to Ms. Mcquitty history and her age, she should receive screening for skin cancers, colon cancer, and gynecologic cancers.  The information and recommendations are listed on the patient's comprehensive care plan/treatment summary and were reviewed in detail with the patient.    5. Health maintenance and wellness promotion: Ms. Weld was encouraged to consume 5-7 servings of fruits and vegetables per day. We reviewed the "Nutrition Rainbow" handout, as well as the handout "Take Control of Your Health and Reduce Your Cancer Risk" from the [place].  She was also encouraged to engage in moderate to vigorous exercise for 30 minutes per day most days of the week. We discussed the [employer] fitness program, which is designed for cancer survivors to help them become more physically fit after cancer treatments.  She was instructed to limit her alcohol consumption and continue to abstain from tobacco use.     6. Support services/counseling: It is not uncommon for this period of the patient's cancer care trajectory to be one of many emotions and stressors.  We discussed how this can be increasingly difficult during the times of quarantine and social distancing due to the COVID-19 pandemic.   She was given  information regarding our available services and encouraged to  contact me with any questions or for help enrolling in any of our support group/programs.    Follow up instructions:    -Return to cancer center 04/2019  -Mammogram due in 01/2019 -She is welcome to return back to the Survivorship Clinic at any time; no additional follow-up needed at this time.  -Consider referral back to survivorship as a long-term survivor for continued surveillance  The patient was provided an opportunity to ask questions and all were answered. The patient agreed with the plan and demonstrated an understanding of the instructions.   The patient was advised to call back or seek an in-person evaluation if the symptoms worsen or if the condition fails to improve as anticipated.   I provided 25 minutes of non face-to-face telephone visit time during this encounter, and > 50% was spent counseling as documented under my assessment & plan.  Scot Dock, NP

## 2019-01-27 NOTE — Telephone Encounter (Signed)
Left vm for patient to inform of Diagnostic mammogram to be done at [facility], [facility] on Wed. 02/09/19 at 10 am.  Also sent a message to her My Chart.  Center number was left on vm if she needs to call us back for more information.

## 2019-01-31 ENCOUNTER — Encounter (INDEPENDENT_AMBULATORY_CARE_PROVIDER_SITE_OTHER): Payer: Self-pay

## 2019-02-01 ENCOUNTER — Telehealth: Payer: Self-pay | Admitting: Urology

## 2019-02-01 NOTE — Telephone Encounter (Signed)
Called. Went straight to voicemail left a message to return.

## 2019-02-01 NOTE — Telephone Encounter (Signed)
Pt called and complains of " back pain" she states when she has back pain she normally has a UTI. Not scheduled to see you until February and your schedule is full as of now. Can we do a urine drop off for ua/culture?

## 2019-02-01 NOTE — Telephone Encounter (Signed)
Pt called and requests a sooner appointment because she is having issues. She states Dr Jeffie Pollock told her to call if she had any issues.

## 2019-02-02 ENCOUNTER — Other Ambulatory Visit: Payer: Self-pay

## 2019-02-02 ENCOUNTER — Ambulatory Visit (INDEPENDENT_AMBULATORY_CARE_PROVIDER_SITE_OTHER): Payer: Medicare Other

## 2019-02-02 ENCOUNTER — Other Ambulatory Visit (HOSPITAL_COMMUNITY)
Admission: RE | Admit: 2019-02-02 | Discharge: 2019-02-02 | Disposition: A | Discharge status: Home / Self Care | Payer: Medicare Other | Source: Other Acute Inpatient Hospital | Attending: Urology | Admitting: Urology

## 2019-02-02 DIAGNOSIS — N390 Urinary tract infection, site not specified: Secondary | ICD-10-CM

## 2019-02-02 NOTE — Telephone Encounter (Signed)
Yes.  She can do a drop off UA/Cx.

## 2019-02-02 NOTE — Telephone Encounter (Signed)
I talked to patient and scheduled her for a nurse visit for urine drop off today.

## 2019-02-02 NOTE — Progress Notes (Signed)
Pt brought urine sample to be dropped off for ua and culture. Sent to lab.

## 2019-02-02 NOTE — Telephone Encounter (Signed)
Returned call to inform. Pt left a message to return call

## 2019-02-04 ENCOUNTER — Telehealth: Payer: Self-pay | Admitting: Urology

## 2019-02-04 ENCOUNTER — Other Ambulatory Visit: Payer: Self-pay

## 2019-02-04 LAB — URINE CULTURE: Culture: >=100000 — AB

## 2019-02-04 MED ORDER — CEPHALEXIN 500 MG PO CAPS: 500.0000 mg | ORAL_CAPSULE | Freq: Three times a day (TID) | ORAL | 0 refills | Status: DC

## 2019-02-04 NOTE — Telephone Encounter (Signed)
Culture report sent to Dr. Jeffie Pollock to review

## 2019-02-04 NOTE — Telephone Encounter (Signed)
-----   Message from Irine Seal, MD sent at 02/04/2019 12:00 PM EST ----- She needs keflex 500mg  po tid #21 ----- Message ----- From: Dorisann Frames, RN Sent: 02/04/2019  10:21 AM EST To: Irine Seal, MD  Urine culture result from urine drop off

## 2019-02-04 NOTE — Telephone Encounter (Signed)
Pt called and requests results of the urine that she dropped off.

## 2019-02-04 NOTE — Telephone Encounter (Signed)
Pt notified of culture results and prescription sent in.

## 2019-02-08 ENCOUNTER — Ambulatory Visit (INDEPENDENT_AMBULATORY_CARE_PROVIDER_SITE_OTHER): Payer: Medicare Other | Admitting: Internal Medicine

## 2019-02-08 ENCOUNTER — Other Ambulatory Visit: Payer: Self-pay

## 2019-02-08 ENCOUNTER — Encounter (INDEPENDENT_AMBULATORY_CARE_PROVIDER_SITE_OTHER): Payer: Self-pay | Admitting: Internal Medicine

## 2019-02-08 VITALS — BP 151/79 | HR 91 | Temp 96.2°F | Ht 65.0 in | Wt 207.2 lb

## 2019-02-08 DIAGNOSIS — K52832 Lymphocytic colitis: Secondary | ICD-10-CM

## 2019-02-08 DIAGNOSIS — K580 Irritable bowel syndrome with diarrhea: Secondary | ICD-10-CM | POA: Diagnosis not present

## 2019-02-08 NOTE — Progress Notes (Signed)
Presenting complaint;  Follow-up for diarrhea.  Patient with a history of lymphocytic colitis.  Database and subjective:  Patient is 74 year old Caucasian female who has history of lymphocytic colitis but also found to have IBS who is here for scheduled visit.  She was last seen in July 2020. She states she did not get a refill on her dicyclomine and therefore did not take it.  Now she has prescription refilled but has not started yet.  She is having intermittent diarrhea with urgency.  She has been able to control her diarrhea with Imodium and on Pepto-Bismol.  She is using them on as-needed basis.  She has had few accidents along the way.  She denies melena or rectal bleeding.  Her appetite is good.  Her weight has been stable. She is not taking PPI anymore.  She was able to come off PPI when she finished Herceptin. She is presently on cephalexin for urinary tract infection. She states she was found to have a right inguinal hernia by her urologist.  Patient has history of colonic adenomas.  Her last colonoscopy was in December 2020.  She had 2 small tubular adenomas removed.  She was not having any diarrhea issues at that time and therefore I did obtain biopsy to check if lymphocytic colitis was active.   Current Medications: Outpatient Encounter Medications as of 02/08/2019  Medication Sig  . acetaminophen (TYLENOL) 325 MG tablet Take 325 mg by mouth every 6 (six) hours as needed (pain).   Marland Kitchen anastrozole (ARIMIDEX) 1 MG tablet Take 1 tablet (1 mg total) by mouth daily.  Marland Kitchen atorvastatin (LIPITOR) 10 MG tablet Take 10 mg by mouth at bedtime.  . cephALEXin (KEFLEX) 500 MG capsule Take 1 capsule (500 mg total) by mouth 3 (three) times daily.  Marland Kitchen escitalopram (LEXAPRO) 5 MG tablet Take 5 mg by mouth at bedtime.   . hydrochlorothiazide (HYDRODIURIL) 25 MG tablet Take 25 mg by mouth daily.  . hydroxypropyl methylcellulose / hypromellose (ISOPTO TEARS / GONIOVISC) 2.5 % ophthalmic solution Place 1  drop into both eyes 3 (three) times daily as needed for dry eyes.  Marland Kitchen loperamide (IMODIUM) 2 MG capsule Take 2 mg by mouth as needed for diarrhea or loose stools.   . Menthol, Topical Analgesic, (ICY HOT) 5 % PADS Apply 1 patch topically daily as needed (pain).   . bismuth subsalicylate (PEPTO BISMOL) 262 MG/15ML suspension Take 30 mLs by mouth every 6 (six) hours as needed for indigestion.  . dicyclomine (BENTYL) 10 MG capsule Take 1 capsule (10 mg total) by mouth 3 (three) times daily as needed for spasms. Patient states that she started this 3 weeks ago (Patient not taking: Reported on 02/08/2019)  . [DISCONTINUED] prochlorperazine (COMPAZINE) 10 MG tablet Take 1 tablet (10 mg total) by mouth every 6 (six) hours as needed (Nausea or vomiting).   No facility-administered encounter medications on file as of 02/08/2019.     Objective: Blood pressure (!) 151/79, pulse 91, temperature (!) 96.2 F (35.7 C), temperature source Temporal, height 5' 5"  (1.651 m), weight 207 lb 3.2 oz (94 kg). Patient is alert and in no acute distress. She is wearing a facial mask. Conjunctiva is pink. Sclera is nonicteric Oropharyngeal mucosa is normal. No neck masses or thyromegaly noted. Cardiac exam with regular rhythm normal S1 and S2. No murmur or gallop noted. Lungs are clear to auscultation. Abdomen is full.  She has wide appendectomy scar.  She has right inguinal hernia which is completely reducible and supine  position.  No organomegaly or masses. No LE edema or clubbing noted.  Labs/studies Results:   CBC Latest Ref Rng & Units 10/25/2018 09/06/2018 07/26/2018  WBC 4.0 - 10.5 K/uL 5.8 4.7 6.2  Hemoglobin 12.0 - 15.0 g/dL 13.3 12.9 12.8  Hematocrit 36.0 - 46.0 % 39.3 39.0 38.8  Platelets 150 - 400 K/uL 155 167 166    CMP Latest Ref Rng & Units 10/25/2018 09/06/2018 07/26/2018  Glucose 70 - 99 mg/dL 100(H) 119(H) 110(H)  BUN 8 - 23 mg/dL 18 23 21   Creatinine 0.44 - 1.00 mg/dL 0.87 0.82 0.95  Sodium 135  - 145 mmol/L 145 144 145  Potassium 3.5 - 5.1 mmol/L 3.7 3.8 3.7  Chloride 98 - 111 mmol/L 105 109 110  CO2 22 - 32 mmol/L 28 25 26   Calcium 8.9 - 10.3 mg/dL 9.4 9.0 8.8(L)  Total Protein 6.5 - 8.1 g/dL 6.8 6.4(L) 6.6  Total Bilirubin 0.3 - 1.2 mg/dL 0.5 0.4 0.3  Alkaline Phos 38 - 126 U/L 92 93 88  AST 15 - 41 U/L 13(L) 15 16  ALT 0 - 44 U/L 15 20 17     Hepatic Function Latest Ref Rng & Units 10/25/2018 09/06/2018 07/26/2018  Total Protein 6.5 - 8.1 g/dL 6.8 6.4(L) 6.6  Albumin 3.5 - 5.0 g/dL 3.8 3.5 3.6  AST 15 - 41 U/L 13(L) 15 16  ALT 0 - 44 U/L 15 20 17   Alk Phosphatase 38 - 126 U/L 92 93 88  Total Bilirubin 0.3 - 1.2 mg/dL 0.5 0.4 0.3      Assessment:  #1.  History of lymphocytic colitis.  She had colonoscopy last month and I felt she was in remission and therefore did not proceed with biopsy.  In retrospect it would have been a good idea.  She is using Pepto-Bismol on as-needed basis which should help.  #2.  Irritable bowel syndrome with diarrhea.  I believe most of her symptoms are due to IBS.  Since she is not have any side effects she can go back to taking dicyclomine maybe 10 mg a day along with Imodium and see how she does.  If these medications do not provide remedy to her diarrhea would consider treating her for lymphocytic colitis.  #3.  Right inguinal hernia.  I agree she should get it fixed before it becomes an emergency.  She will contact Dr. Constance Haw office for an appointment.     Plan:  Patient can continue Imodium 2 mg once or twice daily and dicyclomine 10 mg once or twice daily. She can use Pepto-Bismol on as-needed basis. If she remains with diarrhea she will call office otherwise return for office visit in 1 year.

## 2019-02-08 NOTE — Patient Instructions (Signed)
Please call office if stool frequency worsen and not controlled with Imodium Pepto-Bismol and dicyclomine

## 2019-02-09 DIAGNOSIS — R928 Other abnormal and inconclusive findings on diagnostic imaging of breast: Secondary | ICD-10-CM | POA: Diagnosis not present

## 2019-02-09 DIAGNOSIS — C50412 Malignant neoplasm of upper-outer quadrant of left female breast: Secondary | ICD-10-CM | POA: Diagnosis not present

## 2019-02-15 DIAGNOSIS — M8588 Other specified disorders of bone density and structure, other site: Secondary | ICD-10-CM | POA: Diagnosis not present

## 2019-02-15 DIAGNOSIS — M85851 Other specified disorders of bone density and structure, right thigh: Secondary | ICD-10-CM | POA: Diagnosis not present

## 2019-02-15 DIAGNOSIS — M810 Age-related osteoporosis without current pathological fracture: Secondary | ICD-10-CM | POA: Diagnosis not present

## 2019-02-23 DIAGNOSIS — N3000 Acute cystitis without hematuria: Secondary | ICD-10-CM | POA: Diagnosis not present

## 2019-02-23 DIAGNOSIS — I10 Essential (primary) hypertension: Secondary | ICD-10-CM | POA: Diagnosis not present

## 2019-02-23 DIAGNOSIS — M545 Low back pain: Secondary | ICD-10-CM | POA: Diagnosis not present

## 2019-02-23 DIAGNOSIS — M8580 Other specified disorders of bone density and structure, unspecified site: Secondary | ICD-10-CM | POA: Diagnosis not present

## 2019-02-23 DIAGNOSIS — Z6835 Body mass index (BMI) 35.0-35.9, adult: Secondary | ICD-10-CM | POA: Diagnosis not present

## 2019-02-23 DIAGNOSIS — F411 Generalized anxiety disorder: Secondary | ICD-10-CM | POA: Diagnosis not present

## 2019-02-23 DIAGNOSIS — F331 Major depressive disorder, recurrent, moderate: Secondary | ICD-10-CM | POA: Diagnosis not present

## 2019-02-23 DIAGNOSIS — E7800 Pure hypercholesterolemia, unspecified: Secondary | ICD-10-CM | POA: Diagnosis not present

## 2019-02-23 DIAGNOSIS — D0500 Lobular carcinoma in situ of unspecified breast: Secondary | ICD-10-CM | POA: Diagnosis not present

## 2019-02-24 ENCOUNTER — Encounter: Payer: Self-pay | Admitting: *Deleted

## 2019-02-24 ENCOUNTER — Telehealth: Payer: Self-pay | Admitting: *Deleted

## 2019-02-24 NOTE — Telephone Encounter (Signed)
Received call from pt stating she is currently taking her anastrozole every other day and is experiencing diffused joint and bone pain/aches.  Pt states she recently had a bone density scan done at her primary care providers office, Dr. Consuello Masse, which showed osteopenia.  Pt states her PCP recommended her to contact our office and switch her anastrozole to raloxifene.  Per Dr. Lissa Morales, pt to stop taking anastrozole x2 weeks to see if symptoms improve first. Pt verbalizes understanding and states she will call the office in 2 weeks with an update on symptoms.  RN will place a call to Dr. Edythe Lynn office and request bone density report to have on file.

## 2019-02-24 NOTE — Progress Notes (Signed)
Received Bone Density report from [facility] showing T score -1.4.  Pt states she is currently taking Vitamin D 2,000 mg and Calcium 1200 mg daily.  Will send report to be scanned into epic.

## 2019-02-25 DIAGNOSIS — H524 Presbyopia: Secondary | ICD-10-CM | POA: Diagnosis not present

## 2019-02-25 DIAGNOSIS — Z961 Presence of intraocular lens: Secondary | ICD-10-CM | POA: Diagnosis not present

## 2019-03-03 NOTE — Progress Notes (Signed)
Subjective:  1. Recurrent urinary tract infection   2. Urge incontinence      CC: Low back pain.   Ione returns today in f/u. She had e. coli on a culture on 1/20/21and was given Keflex.  She was put back on Keflex by her PCP for another positive culture.   She is also on amoxicillin for a tooth tha  Ht needs to be extracted.  She has some UUI if she is not careful.   She has some frequency.   She was found to have right inguinal hernia is going to get set up with general surgery.   She completed chemo and radiation for left breast cancer on 10/22/18 and was initially treated with lumpectomy and node dissection.   She had a CT prior to her visit in May 2020 for f/u of a left renal cyst and she was found to have a stable 6.3cm LUP cyst and bilateral peripelvic cysts. The right pelvis is dilated but not clearly obstructed. She has frequency q2hrs with urgency with UUI. It didn't improve with the antibiotic. She had a negative cystoscopy for the UTI's in 3/17 by Dr. Risa Grill.   GU Hx: Kayona is a 74 yo WF who returns with concern about a left renal cyst. He had a recent US that showed a 7.3cm LUP cyst. It was 6.5cm on an [place] with Dr. Risa Grill in 2017. She has a history of a right ureteral stricture with hydronephrosis. No hydro was reported on the left kidney but on a review of a CT from late last year, there did appear to be dilation on the left as well. She has not had a complete abdominal study. She has no flank pain but has some low back pain that can be on the right. She has urgency and frequency with UUI but no SUI. She has nocturia q2hrs. She had a sling in 2002 but had recurrent incontience of an urge nature in a year. She wears pads. She has had no hematuria. She has a history of recurrent UTI's but no dysuria. She hasn't had a UTI since 2016.      ROS:  ROS:  A complete review of systems was performed.  All systems are negative except for pertinent findings as noted.   Review of  Systems  Genitourinary: Positive for frequency and urgency.  All other systems reviewed and are negative.   Allergies  Allergen Reactions  . Fiorinal-Codeine #3 [Butalbital-Asa-Caff-Codeine] Rash  . Lansoprazole Swelling  . Latex Swelling, Other (See Comments), Itching and Rash    blisters  . Butalbital-Aspirin-Caffeine Rash  . Sulfa Antibiotics Rash  . Femara [Letrozole] Rash    Rash when tried in 1984  . Sulfasalazine Rash    Outpatient Encounter Medications as of 03/04/2019  Medication Sig Note  . acetaminophen (TYLENOL) 325 MG tablet Take 325 mg by mouth every 6 (six) hours as needed (pain).    Marland Kitchen amoxicillin (AMOXIL) 500 MG capsule TAKE 2 CAPSULES BY MOUTH NOW THEN TAKE 1 CAPSULE EVERY 6 HOURS UNTIL GONE   . atorvastatin (LIPITOR) 10 MG tablet Take 10 mg by mouth at bedtime.   . bismuth subsalicylate (PEPTO BISMOL) 262 MG/15ML suspension Take 30 mLs by mouth every 6 (six) hours as needed for indigestion.   . calcium-vitamin D (OSCAL WITH D) 500-200 MG-UNIT TABS tablet Take by mouth.   . cephALEXin (KEFLEX) 500 MG capsule Take 1 capsule (500 mg total) by mouth 3 (three) times daily.   Marland Kitchen escitalopram (LEXAPRO)  5 MG tablet Take 5 mg by mouth at bedtime.  02/08/2019: Patient does not take every day.  . hydrochlorothiazide (MICROZIDE) 12.5 MG capsule Take 12.5 mg by mouth daily.   . hydroxypropyl methylcellulose / hypromellose (ISOPTO TEARS / GONIOVISC) 2.5 % ophthalmic solution Place 1 drop into both eyes 3 (three) times daily as needed for dry eyes.   Marland Kitchen loperamide (IMODIUM) 2 MG capsule Take 2 mg by mouth as needed for diarrhea or loose stools.    . Menthol, Topical Analgesic, (ICY HOT) 5 % PADS Apply 1 patch topically daily as needed (pain).    . psyllium (REGULOID) 0.52 g capsule Take 0.52 g by mouth at bedtime.   Marland Kitchen SHINGRIX injection    . anastrozole (ARIMIDEX) 1 MG tablet Take 1 tablet (1 mg total) by mouth daily. (Patient not taking: Reported on 03/04/2019) 02/08/2019: Patient  states that she takes 2-3 times a week.  . cephALEXin (KEFLEX) 250 MG capsule Take 1 capsule (250 mg total) by mouth at bedtime.   . dicyclomine (BENTYL) 10 MG capsule Take 1 capsule (10 mg total) by mouth 3 (three) times daily as needed for spasms. Patient states that she started this 3 weeks ago (Patient not taking: Reported on 02/08/2019)   . hydrochlorothiazide (HYDRODIURIL) 25 MG tablet Take 25 mg by mouth daily.   . [DISCONTINUED] prochlorperazine (COMPAZINE) 10 MG tablet Take 1 tablet (10 mg total) by mouth every 6 (six) hours as needed (Nausea or vomiting).    No facility-administered encounter medications on file as of 03/04/2019.    Past Medical History:  Diagnosis Date  . Anxiety    Since in her 20's  . Arthritis 2013   Right hip  . Cancer [facility]) 2004    Breast Lumpectomy  . Complication of anesthesia    Has a small throat  . Depression    Started when she was in her 66's  . Hypercholesteremia 2012  . Hypertension 2013  . IBS (irritable bowel syndrome) 2018   per GI note  . Incontinence-urinary 2012  . Lobular carcinoma in situ (LCIS) of left breast 09/08/2008  . Melanoma of back ([place]) 1994   Excision with clear margins  . Microscopic colitis 2015   Resolved 2016    Past Surgical History:  Procedure Laterality Date  . ABDOMINAL HYSTERECTOMY  2002  . APPENDECTOMY  1949  . BIOPSY  02/26/2018   Procedure: BIOPSY;  Surgeon: Rogene Houston, MD;  [facility];  Service: Endoscopy;;  gastric  . BIOPSY BREAST  2010   Left  . Bladder Tack  2002  . BREAST LUMPECTOMY  2004   Left  . BREAST LUMPECTOMY  2004   Left  . BREAST LUMPECTOMY  2006   Left  . BREAST LUMPECTOMY  2000   Left  . BREAST LUMPECTOMY WITH RADIOACTIVE SEED AND SENTINEL LYMPH NODE BIOPSY Left 09/11/2017   Procedure: LEFT BREAST LUMPECTOMY X2 WITH RADIOACTIVE SEED AND LEFT AXILLARY SENTINEL LYMPH NODE BIOPSY;  Surgeon: Excell Seltzer, MD;  Location: [place];  Service: General;  Laterality:  Left;  . BUNIONECTOMY  1994   Right  . CATARACT EXTRACTION W/PHACO Left 08/18/2013   Procedure: CATARACT EXTRACTION PHACO AND INTRAOCULAR LENS PLACEMENT (IOC);  Surgeon: Tonny Branch, MD;  Location: [facility];  Service: Ophthalmology;  Laterality: Left;  CDE: 8.62  . COLONOSCOPY N/A 11/10/2013   Procedure: COLONOSCOPY;  Surgeon: Rogene Houston, MD;  [facility];  Service: Endoscopy;  Laterality: N/A;  240  .  COLONOSCOPY N/A 12/30/2018   Procedure: COLONOSCOPY;  Surgeon: Rogene Houston, MD;  [facility];  Service: Endoscopy;  Laterality: N/A;  12  . ESOPHAGOGASTRODUODENOSCOPY N/A 02/26/2018   Procedure: ESOPHAGOGASTRODUODENOSCOPY (EGD);  Surgeon: Rogene Houston, MD;  [facility];  Service: Endoscopy;  Laterality: N/A;  1055  . EYE SURGERY  2011   Catarct Right  . EYE SURGERY  08-2011   Right Yag procedure  . JOINT REPLACEMENT  08-2010   Left total knee  . KNEE ARTHROSCOPY  2010   Right  . KNEE ARTHROSCOPY  2003   Left  . POLYPECTOMY  12/30/2018   Procedure: POLYPECTOMY;  Surgeon: Rogene Houston, MD;  [facility];  Service: Endoscopy;;  hepatic flexure,transverse colon    . PORTACATH PLACEMENT N/A 09/11/2017   Procedure: INSERTION PORT-A-CATH;  Surgeon: Excell Seltzer, MD;  Location: [place];  Service: General;  Laterality: N/A;  . TOTAL KNEE ARTHROPLASTY  11/03/2011   Procedure: TOTAL KNEE ARTHROPLASTY;  Surgeon: Gearlean Alf, MD;  Location: [facility];  Service: Orthopedics;  Laterality: Right;  . TUBAL LIGATION    . WRIST SURGERY Bilateral 03-2010  . WRIST SURGERY  2008   Right    Social History   Socioeconomic History  . Marital status: Married    Spouse name: Not on file  . Number of children: Not on file  . Years of education: Not on file  . Highest education level: Not on file  Occupational History  . Not on file  Tobacco Use  . Smoking status: Never Smoker  . Smokeless tobacco: Never Used  Substance and Sexual Activity   . Alcohol use: No    Alcohol/week: 0.0 standard drinks  . Drug use: No  . Sexual activity: Yes    Birth control/protection: Surgical  Other Topics Concern  . Not on file  Social History Narrative  . Not on file   Social Determinants of Health   Financial Resource Strain:   . Difficulty of Paying Living Expenses: Not on file  Food Insecurity:   . Worried About Charity fundraiser in the Last Year: Not on file  . Ran Out of Food in the Last Year: Not on file  Transportation Needs:   . Lack of Transportation (Medical): Not on file  . Lack of Transportation (Non-Medical): Not on file  Physical Activity:   . Days of Exercise per Week: Not on file  . Minutes of Exercise per Session: Not on file  Stress:   . Feeling of Stress : Not on file  Social Connections:   . Frequency of Communication with Friends and Family: Not on file  . Frequency of Social Gatherings with Friends and Family: Not on file  . Attends Religious Services: Not on file  . Active Member of Clubs or Organizations: Not on file  . Attends Archivist Meetings: Not on file  . Marital Status: Not on file  Intimate Partner Violence:   . Fear of Current or Ex-Partner: Not on file  . Emotionally Abused: Not on file  . Physically Abused: Not on file  . Sexually Abused: Not on file    Family History  Problem Relation Age of Onset  . Breast cancer Sister 46  . Lymphoma Maternal Aunt        Objective: Vitals:   03/04/19 1039  BP: 134/83  Pulse: 90  Temp: (!) 96.2 F (35.7 C)     Physical Exam  Lab  Results:  Results for orders placed or performed in visit on 03/04/19 (from the past 24 hour(s))  POCT urinalysis dipstick     Status: None   Collection Time: 03/04/19 10:46 AM  Result Value Ref Range   Color, UA yellow    Clarity, UA     Glucose, UA Negative Negative   Bilirubin, UA neg    Ketones, UA neg    Spec Grav, UA 1.020 1.010 - 1.025   Blood, UA neg    pH, UA 5.0 5.0 - 8.0    Protein, UA Negative Negative   Urobilinogen, UA 0.2 0.2 or 1.0 E.U./dL   Nitrite, UA neg    Leukocytes, UA Negative Negative   Appearance clear    Odor      BMET No results for input(s): NA, K, CL, CO2, GLUCOSE, BUN, CREATININE, CALCIUM in the last 72 hours. PSA No results found for: PSA No results found for: TESTOSTERONE    Studies/Results: No results found.    Assessment & Plan: Recurrent UTI's.   Her UA is clear on Keflex but she has had she has had multiple e. Coli infections over the past year.   I am going to start her on suppression with keflex 250mg  at bedtime.  Frequency with urgency and UUI.  She has to drink water to avoid irritation.    Meds ordered this encounter  Medications  . cephALEXin (KEFLEX) 250 MG capsule    Sig: Take 1 capsule (250 mg total) by mouth at bedtime.    Dispense:  30 capsule    Refill:  5     Orders Placed This Encounter  Procedures  . POCT urinalysis dipstick      Return in about 3 months (around 06/01/2019) for For UTI's. .   CC: Sasser, Silvestre Moment, MD      Sylvia Barnett 03/04/2019

## 2019-03-04 ENCOUNTER — Other Ambulatory Visit: Payer: Self-pay

## 2019-03-04 ENCOUNTER — Encounter: Payer: Self-pay | Admitting: Urology

## 2019-03-04 ENCOUNTER — Ambulatory Visit (INDEPENDENT_AMBULATORY_CARE_PROVIDER_SITE_OTHER): Payer: Medicare Other | Admitting: Urology

## 2019-03-04 VITALS — BP 134/83 | HR 90 | Temp 96.2°F | Ht 63.0 in | Wt 212.0 lb

## 2019-03-04 DIAGNOSIS — N390 Urinary tract infection, site not specified: Secondary | ICD-10-CM | POA: Diagnosis not present

## 2019-03-04 DIAGNOSIS — N3941 Urge incontinence: Secondary | ICD-10-CM

## 2019-03-04 LAB — POCT URINALYSIS DIPSTICK
Bilirubin, UA: NEGATIVE
Blood, UA: NEGATIVE
Glucose, UA: NEGATIVE
Ketones, UA: NEGATIVE
Leukocytes, UA: NEGATIVE
Nitrite, UA: NEGATIVE
Protein, UA: NEGATIVE
Spec Grav, UA: 1.02 (ref 1.010–1.025)
Urobilinogen, UA: 0.2 E.U./dL
pH, UA: 5 (ref 5.0–8.0)

## 2019-03-04 MED ORDER — CEPHALEXIN 250 MG PO CAPS: 250.0000 mg | ORAL_CAPSULE | Freq: Every day | ORAL | 5 refills | Status: DC

## 2019-04-21 DIAGNOSIS — E876 Hypokalemia: Secondary | ICD-10-CM | POA: Diagnosis not present

## 2019-04-21 DIAGNOSIS — R079 Chest pain, unspecified: Secondary | ICD-10-CM | POA: Diagnosis not present

## 2019-04-21 DIAGNOSIS — Z888 Allergy status to other drugs, medicaments and biological substances status: Secondary | ICD-10-CM | POA: Diagnosis not present

## 2019-04-21 DIAGNOSIS — M1990 Unspecified osteoarthritis, unspecified site: Secondary | ICD-10-CM | POA: Diagnosis not present

## 2019-04-21 DIAGNOSIS — Z20822 Contact with and (suspected) exposure to covid-19: Secondary | ICD-10-CM | POA: Diagnosis not present

## 2019-04-21 DIAGNOSIS — Z923 Personal history of irradiation: Secondary | ICD-10-CM | POA: Diagnosis not present

## 2019-04-21 DIAGNOSIS — Z90710 Acquired absence of both cervix and uterus: Secondary | ICD-10-CM | POA: Diagnosis not present

## 2019-04-21 DIAGNOSIS — Z79899 Other long term (current) drug therapy: Secondary | ICD-10-CM | POA: Diagnosis not present

## 2019-04-21 DIAGNOSIS — R072 Precordial pain: Secondary | ICD-10-CM | POA: Diagnosis not present

## 2019-04-21 DIAGNOSIS — Z9221 Personal history of antineoplastic chemotherapy: Secondary | ICD-10-CM | POA: Diagnosis not present

## 2019-04-21 DIAGNOSIS — Z9049 Acquired absence of other specified parts of digestive tract: Secondary | ICD-10-CM | POA: Diagnosis not present

## 2019-04-21 DIAGNOSIS — Z96653 Presence of artificial knee joint, bilateral: Secondary | ICD-10-CM | POA: Diagnosis not present

## 2019-04-21 DIAGNOSIS — Z882 Allergy status to sulfonamides status: Secondary | ICD-10-CM | POA: Diagnosis not present

## 2019-04-21 DIAGNOSIS — I10 Essential (primary) hypertension: Secondary | ICD-10-CM | POA: Diagnosis not present

## 2019-04-21 DIAGNOSIS — E785 Hyperlipidemia, unspecified: Secondary | ICD-10-CM | POA: Diagnosis not present

## 2019-04-21 DIAGNOSIS — Z853 Personal history of malignant neoplasm of breast: Secondary | ICD-10-CM | POA: Diagnosis not present

## 2019-04-22 DIAGNOSIS — R079 Chest pain, unspecified: Secondary | ICD-10-CM | POA: Diagnosis not present

## 2019-04-25 ENCOUNTER — Inpatient Hospital Stay: Payer: Medicare Other | Admitting: Hematology and Oncology

## 2019-04-25 NOTE — Assessment & Plan Note (Deleted)
09/11/2017:Left lumpectomy: Grade 2 invasive lobular cancer, 1.1 cm, LCIS, margins negative, 0/2 lymph nodes negative, ER 90% strong staining, PR negative, HER-2 positive ratio 2.62, Ki-67 1%, T1c N0 stage Ia  Recommendation: 1.Adjuvant therapy with Taxol Herceptin weekly x12 followed by Herceptin maintenance for 1 year 2.Adjuvant radiationcompleted 03/23/2018 3.Followed by adjuvant antiestrogen therapystarted 05/03/2018 ------------------------------------------------------------------ Current treatment: 1. Herceptin maintenance therapy every 3 weekscompleted October 2020,  2. antiestrogen therapy with anastrozole 1 mg daily4/20/2020 Chemo-induced peripheral neuropathy:Stable to improving Chemotherapy-induced memory loss: We talked about remember study. She may do this once treatment is complete  Anastrozoletoxicities:Occasional muscle aches and pains. She has been taking anastrozole on and off.  Breast cancer surveillance:  mammograms at [facility] center in [place] Breast exam 04/25/2019: Benign  Return to clinic in 1 year for follow-up

## 2019-04-26 NOTE — Progress Notes (Signed)
Patient Care Team: Sasser, Silvestre Moment, MD as PCP - General (Unknown Physician Specialty) Excell Seltzer, MD (Inactive) as Consulting Physician (General Surgery) Nicholas Lose, MD as Consulting Physician (Hematology and Oncology) Gery Pray, MD as Consulting Physician (Radiation Oncology)  DIAGNOSIS:    ICD-10-CM   1. Malignant neoplasm of upper-outer quadrant of left breast in female, estrogen receptor positive ([place])  C50.412    Z17.0     SUMMARY OF ONCOLOGIC HISTORY: Oncology History  Malignant neoplasm of upper-outer quadrant of left breast in female, estrogen receptor positive ([place])  08/05/2017 Initial Diagnosis   Screening detected left breast calcifications UOQ 1.1 cm biopsy revealed invasive lobular cancer with LCIS and CSL, grade 2, ER 90%, PR 0%, Ki-67 1%, HER-2 positive ratio 2.62, T2N0 stage Ia clinical stage AJCC 8   08/12/2017 Cancer Staging   Staging form: Breast, AJCC 8th Edition - Clinical stage from 08/12/2017: Stage IA (cT1c, cN0, cM0, G2, ER+, PR-, HER2+) - Signed by Nicholas Lose, MD on 08/12/2017   09/11/2017 Surgery   Left lumpectomy: Grade 2 invasive lobular cancer, 1.1 cm, LCIS, margins negative, 0/2 lymph nodes negative, ER 90% strong staining, PR negative, HER-2 positive ratio 2.62, Ki-67 1%, T1c N0 stage Ia   09/23/2017 Cancer Staging   Staging form: Breast, AJCC 8th Edition - Pathologic: Stage IA (pT1c, pN0, cM0, G2, ER+, PR-, HER2+) - Signed by Gardenia Phlegm, NP on 09/23/2017   11/02/2017 -  Chemotherapy   Adjuvant therapy with Taxol Herceptin weekly x12 followed by Herceptin maintenance for 1 year   02/25/2018 - 03/23/2018 Radiation Therapy   Adjuvant Radiation in [place], [place] [facility]).  Left Breast 42.56 Gy   05/03/2018 -  Anti-estrogen oral therapy   Anastrozole, '1mg'$  daily (plan for 7 years), discontinued 02/24/19 due to joint aches and pains     CHIEF COMPLIANT: Follow-up of left breast cancer on anastrozole  INTERVAL HISTORY: Sylvia Barn[place] is a 74 y.o. with above-mentioned history of left breast cancer treated with lumpectomy,adjuvant chemotherapy,radiation, Herceptin maintenance, and who is currently on anti-estrogen therapy with anastrozole, which was discontinued on 02/24/19 due to diffuse joint aches and pains. She is a participant in the [employer] clinical trial. Mammogram on 02/08/18 showed no evidence of malignancy bilaterally. Bone density scan on 02/15/19 showed osteopenia with a T-score of -1.4 at the right femur. Shepresents to the clinic today for follow-up to discuss alternate antiestrogen therapy.   ALLERGIES:  is allergic to fiorinal-codeine #3 [butalbital-asa-caff-codeine]; lansoprazole; latex; butalbital-aspirin-caffeine; sulfa antibiotics; femara [letrozole]; and sulfasalazine.  MEDICATIONS:  Current Outpatient Medications  Medication Sig Dispense Refill  . acetaminophen (TYLENOL) 325 MG tablet Take 325 mg by mouth every 6 (six) hours as needed (pain).     Marland Kitchen amoxicillin (AMOXIL) 500 MG capsule TAKE 2 CAPSULES BY MOUTH NOW THEN TAKE 1 CAPSULE EVERY 6 HOURS UNTIL GONE    . anastrozole (ARIMIDEX) 1 MG tablet Take 1 tablet (1 mg total) by mouth daily. (Patient not taking: Reported on 03/04/2019) 90 tablet 3  . atorvastatin (LIPITOR) 10 MG tablet Take 10 mg by mouth at bedtime.    . bismuth subsalicylate (PEPTO BISMOL) 262 MG/15ML suspension Take 30 mLs by mouth every 6 (six) hours as needed for indigestion.    . calcium-vitamin D (OSCAL WITH D) 500-200 MG-UNIT TABS tablet Take by mouth.    . cephALEXin (KEFLEX) 250 MG capsule Take 1 capsule (250 mg total) by mouth at bedtime. 30 capsule 5  . cephALEXin (KEFLEX) 500 MG capsule Take 1  capsule (500 mg total) by mouth 3 (three) times daily. 21 capsule 0  . dicyclomine (BENTYL) 10 MG capsule Take 1 capsule (10 mg total) by mouth 3 (three) times daily as needed for spasms. Patient states that she started this 3 weeks ago (Patient not taking: Reported on 02/08/2019) 270  capsule 1  . escitalopram (LEXAPRO) 5 MG tablet Take 5 mg by mouth at bedtime.     . hydrochlorothiazide (HYDRODIURIL) 25 MG tablet Take 25 mg by mouth daily.    . hydrochlorothiazide (MICROZIDE) 12.5 MG capsule Take 12.5 mg by mouth daily.    . hydroxypropyl methylcellulose / hypromellose (ISOPTO TEARS / GONIOVISC) 2.5 % ophthalmic solution Place 1 drop into both eyes 3 (three) times daily as needed for dry eyes.    Marland Kitchen loperamide (IMODIUM) 2 MG capsule Take 2 mg by mouth as needed for diarrhea or loose stools.     . Menthol, Topical Analgesic, (ICY HOT) 5 % PADS Apply 1 patch topically daily as needed (pain).     . psyllium (REGULOID) 0.52 g capsule Take 0.52 g by mouth at bedtime.    Marland Kitchen SHINGRIX injection      No current facility-administered medications for this visit.    PHYSICAL EXAMINATION: ECOG PERFORMANCE STATUS: 1 - Symptomatic but completely ambulatory  There were no vitals filed for this visit. There were no vitals filed for this visit.  BREAST: No palpable masses or nodules in either right or left breasts. No palpable axillary supraclavicular or infraclavicular adenopathy no breast tenderness or nipple discharge. (exam performed in the presence of a chaperone)  LABORATORY DATA:  I have reviewed the data as listed CMP Latest Ref Rng & Units 10/25/2018 09/06/2018 07/26/2018  Glucose 70 - 99 mg/dL 100(H) 119(H) 110(H)  BUN 8 - 23 mg/dL _0 Creatinine 0.44 - 1.00 mg/dL 0.87 0.82 0.95  Sodium 135 - 145 mmol/L 145 144 145  Potassium 3.5 - 5.1 mmol/L 3.7 3.8 3.7  Chloride 98 - 111 mmol/L 105 109 110  CO2 22 - 32 mmol/L _1 Calcium 8.9 - 10.3 mg/dL 9.4 9.0 8.8(L)  Total Protein 6.5 - 8.1 g/dL 6.8 6.4(L) 6.6  Total Bilirubin 0.3 - 1.2 mg/dL 0.5 0.4 0.3  Alkaline Phos 38 - 126 U/L 92 93 88  AST 15 - 41 U/L 13(L) 15 16  ALT 0 - 44 U/L _2 Lab Results  Component Value Date   WBC 5.8 10/25/2018   HGB 13.3 10/25/2018   HCT 39.3 10/25/2018   MCV 92.3  10/25/2018   PLT 155 10/25/2018   NEUTROABS 4.0 10/25/2018    ASSESSMENT & PLAN:  Malignant neoplasm of upper-outer quadrant of left breast in female, estrogen receptor positive ([facility]) 09/11/2017:Left lumpectomy: Grade 2 invasive lobular cancer, 1.1 cm, LCIS, margins negative, 0/2 lymph nodes negative, ER 90% strong staining, PR negative, HER-2 positive ratio 2.62, Ki-67 1%, T1c N0 stage Ia  Recommendation: 1.Adjuvant therapy with Taxol Herceptin weekly x12 followed by Herceptin maintenance for 1 year completed October 2020 2.Adjuvant radiationcompleted 03/23/2018 3.Followed by adjuvant antiestrogen therapystarted 05/03/2018 ------------------------------------------------------------------ Current treatment:antiestrogen therapy with anastrozole 1 mg daily4/20/2020 Chemo-induced peripheral neuropathy:Stable to improving Chemotherapy-induced memory loss: We talked about remember study. She may do this once treatment is complete  Anastrozoletoxicities:Muscle aches and pains because of which she discontinued anastrozole therapy. I discussed the pros and cons of trying letrozole.  She is willing to give that a try.  We will start her at  half a tablet daily.  Diffuse musculoskeletal pains across the chest: Recent hospitalization at [facility].  Cardiac stress test was performed which was negative.  I would like to get a bone scan to evaluate for symptoms.  Breast Cancer Surveillance: 1. Breast Exam: 04/27/2019: Benign 2. Mammogram: 02/09/19: Benign Density Cat B [facility]) 3. Bone Density: 02/16/19: T score -1.4 Osteopenia: On Ca and Vit D  RTC in 3 months for MyChart virtual visit to assess tolerability to letrozole. I will call her with results of the bone scan.  No orders of the defined types were placed in this encounter.  The patient has a good understanding of the overall plan. she agrees with it. she will call with any problems that may develop before the next  visit here.  Total time spent: 30 mins including face to face time and time spent for planning, charting and coordination of care  Nicholas Lose, MD 04/27/2019  I, Cloyde Reams Dorshimer, am acting as scribe for Dr. Nicholas Lose.  I have reviewed the above documentation for accuracy and completeness, and I agree with the above.

## 2019-04-27 ENCOUNTER — Inpatient Hospital Stay: Payer: Medicare Other | Attending: Hematology and Oncology | Admitting: Hematology and Oncology

## 2019-04-27 ENCOUNTER — Other Ambulatory Visit: Payer: Self-pay

## 2019-04-27 DIAGNOSIS — G620 Drug-induced polyneuropathy: Secondary | ICD-10-CM | POA: Insufficient documentation

## 2019-04-27 DIAGNOSIS — Z170 Estrogen receptor positive status [ER+]: Secondary | ICD-10-CM | POA: Diagnosis not present

## 2019-04-27 DIAGNOSIS — C50412 Malignant neoplasm of upper-outer quadrant of left female breast: Secondary | ICD-10-CM | POA: Insufficient documentation

## 2019-04-27 DIAGNOSIS — Z9221 Personal history of antineoplastic chemotherapy: Secondary | ICD-10-CM | POA: Insufficient documentation

## 2019-04-27 DIAGNOSIS — Z923 Personal history of irradiation: Secondary | ICD-10-CM | POA: Insufficient documentation

## 2019-04-27 DIAGNOSIS — M8580 Other specified disorders of bone density and structure, unspecified site: Secondary | ICD-10-CM | POA: Diagnosis not present

## 2019-04-27 DIAGNOSIS — Z79811 Long term (current) use of aromatase inhibitors: Secondary | ICD-10-CM | POA: Diagnosis not present

## 2019-04-27 DIAGNOSIS — Z79899 Other long term (current) drug therapy: Secondary | ICD-10-CM | POA: Insufficient documentation

## 2019-04-27 MED ORDER — METHOCARBAMOL 500 MG PO TABS: 500.0000 mg | ORAL_TABLET | Freq: Four times a day (QID) | ORAL | Status: DC

## 2019-04-27 MED ORDER — IBUPROFEN 800 MG PO TABS: 800.0000 mg | ORAL_TABLET | Freq: Three times a day (TID) | ORAL | 0 refills | Status: DC | PRN

## 2019-04-27 MED ORDER — LETROZOLE 2.5 MG PO TABS: 1.2500 mg | ORAL_TABLET | Freq: Every day | ORAL | 5 refills | Status: DC

## 2019-04-27 NOTE — Assessment & Plan Note (Signed)
09/11/2017:Left lumpectomy: Grade 2 invasive lobular cancer, 1.1 cm, LCIS, margins negative, 0/2 lymph nodes negative, ER 90% strong staining, PR negative, HER-2 positive ratio 2.62, Ki-67 1%, T1c N0 stage Ia  Recommendation: 1.Adjuvant therapy with Taxol Herceptin weekly x12 followed by Herceptin maintenance for 1 year completed October 2020 2.Adjuvant radiationcompleted 03/23/2018 3.Followed by adjuvant antiestrogen therapystarted 05/03/2018 ------------------------------------------------------------------ Current treatment:antiestrogen therapy with anastrozole 1 mg daily4/20/2020 Chemo-induced peripheral neuropathy:Stable to improving Chemotherapy-induced memory loss: We talked about remember study. She may do this once treatment is complete  Anastrozoletoxicities:Occasional muscle aches and pains. She has been taking anastrozole on and off.  Breast Cancer Surveillance: 1. Breast Exam: 04/27/2019: Benign 2. Mammogram: 02/09/19: Benign Density Cat B [facility]) 3. Bone Density: 02/16/19: T score -1.4 Osteopenia: On Ca and Vit D  RTC in 1 year

## 2019-05-03 ENCOUNTER — Telehealth: Payer: Self-pay | Admitting: Hematology and Oncology

## 2019-05-03 NOTE — Telephone Encounter (Signed)
Scheduled per los, patient has been called and notified. 

## 2019-05-11 ENCOUNTER — Telehealth: Payer: Self-pay

## 2019-05-11 NOTE — Telephone Encounter (Signed)
RN returned call, voicemail left for return call.  

## 2019-05-12 ENCOUNTER — Telehealth: Payer: Self-pay

## 2019-05-12 NOTE — Telephone Encounter (Signed)
RN spoke with patient regarding concerns with medication, letrozole.  Pt reports since starting this medication she has been experiencing extreme fatigue, and dry mouth.  Pt verbalized she has been on other AI medications, that she was unable to tolerate.  Pt wishes to discontinue medication, and is comfortable without being on a different AI.    Pt verbalized she will keep upcoming scan appointments and follow up with MD.  RN will notify MD of patient's decision.

## 2019-05-13 ENCOUNTER — Ambulatory Visit (HOSPITAL_COMMUNITY)
Admission: RE | Admit: 2019-05-13 | Discharge: 2019-05-13 | Disposition: A | Discharge status: Home / Self Care | Payer: Medicare Other | Source: Ambulatory Visit | Attending: Hematology and Oncology | Admitting: Hematology and Oncology

## 2019-05-13 ENCOUNTER — Other Ambulatory Visit: Payer: Self-pay

## 2019-05-13 DIAGNOSIS — Z170 Estrogen receptor positive status [ER+]: Secondary | ICD-10-CM | POA: Diagnosis not present

## 2019-05-13 DIAGNOSIS — M545 Low back pain: Secondary | ICD-10-CM | POA: Diagnosis not present

## 2019-05-13 DIAGNOSIS — C50412 Malignant neoplasm of upper-outer quadrant of left female breast: Secondary | ICD-10-CM | POA: Diagnosis not present

## 2019-05-13 MED ORDER — TECHNETIUM TC 99M MEDRONATE IV KIT
20.0000 | PACK | Freq: Once | INTRAVENOUS | Status: AC
  Administered 2019-05-13: 09:00:00 20 via INTRAVENOUS

## 2019-05-22 DIAGNOSIS — R079 Chest pain, unspecified: Secondary | ICD-10-CM | POA: Diagnosis not present

## 2019-06-02 DIAGNOSIS — L820 Inflamed seborrheic keratosis: Secondary | ICD-10-CM | POA: Diagnosis not present

## 2019-06-02 DIAGNOSIS — Z1283 Encounter for screening for malignant neoplasm of skin: Secondary | ICD-10-CM | POA: Diagnosis not present

## 2019-06-02 DIAGNOSIS — Z08 Encounter for follow-up examination after completed treatment for malignant neoplasm: Secondary | ICD-10-CM | POA: Diagnosis not present

## 2019-06-02 DIAGNOSIS — L570 Actinic keratosis: Secondary | ICD-10-CM | POA: Diagnosis not present

## 2019-06-02 DIAGNOSIS — Z85820 Personal history of malignant melanoma of skin: Secondary | ICD-10-CM | POA: Diagnosis not present

## 2019-06-02 DIAGNOSIS — L304 Erythema intertrigo: Secondary | ICD-10-CM | POA: Diagnosis not present

## 2019-06-02 DIAGNOSIS — X32XXXA Exposure to sunlight, initial encounter: Secondary | ICD-10-CM | POA: Diagnosis not present

## 2019-06-03 ENCOUNTER — Encounter: Payer: Self-pay | Admitting: Urology

## 2019-06-03 ENCOUNTER — Other Ambulatory Visit: Payer: Self-pay

## 2019-06-03 ENCOUNTER — Ambulatory Visit (INDEPENDENT_AMBULATORY_CARE_PROVIDER_SITE_OTHER): Payer: Medicare Other | Admitting: Urology

## 2019-06-03 VITALS — BP 121/78 | HR 72 | Temp 97.0°F | Ht 63.0 in | Wt 210.0 lb

## 2019-06-03 DIAGNOSIS — N3281 Overactive bladder: Secondary | ICD-10-CM

## 2019-06-03 DIAGNOSIS — N390 Urinary tract infection, site not specified: Secondary | ICD-10-CM | POA: Diagnosis not present

## 2019-06-03 LAB — POCT URINALYSIS DIPSTICK
Glucose, UA: NEGATIVE
Ketones, UA: NEGATIVE
Nitrite, UA: NEGATIVE
Protein, UA: NEGATIVE
Spec Grav, UA: >=1.03 — AB (ref 1.010–1.025)
Urobilinogen, UA: NEGATIVE E.U./dL — AB
pH, UA: 5 (ref 5.0–8.0)

## 2019-06-03 MED ORDER — MIRABEGRON ER 25 MG PO TB24: 25.0000 mg | ORAL_TABLET | Freq: Every day | ORAL | 0 refills | Status: DC

## 2019-06-03 MED ORDER — CEPHALEXIN 250 MG PO CAPS: 250.0000 mg | ORAL_CAPSULE | Freq: Every day | ORAL | 3 refills | Status: DC

## 2019-06-03 MED ORDER — FLUCONAZOLE 150 MG PO TABS: 150.0000 mg | ORAL_TABLET | Freq: Every day | ORAL | 3 refills | Status: DC

## 2019-06-03 NOTE — Progress Notes (Signed)
06/03/2019 10:46 AM   Sylvia Barnett [birth year] [id number]  Referring provider: Manon Hilding, MD [place],  [place] [address]  Recurrent UTI and urinary urgency  HPI: Sylvia Barnett is a 74yo here for followup for urinary urgency and recurrent. Last visit she was stated on keflex 250 prophylaxis and has not had a UTI since last visit. NO dysuria or hematuria. She has urgency and occasional UUI. She had urinary frequency every 2-2.5 hours. No nocturia.     PMH: Past Medical History:  Diagnosis Date  . Anxiety    Since in her 20's  . Arthritis 2013   Right hip  . Cancer [facility]) 2004    Breast Lumpectomy  . Complication of anesthesia    Has a small throat  . Depression    Started when she was in her 41's  . Hypercholesteremia 2012  . Hypertension 2013  . IBS (irritable bowel syndrome) 2018   per GI note  . Incontinence-urinary 2012  . Lobular carcinoma in situ (LCIS) of left breast 09/08/2008  . Melanoma of back ([place]) 1994   Excision with clear margins  . Microscopic colitis 2015   Resolved 2016    Surgical History: Past Surgical History:  Procedure Laterality Date  . ABDOMINAL HYSTERECTOMY  2002  . APPENDECTOMY  1949  . BIOPSY  02/26/2018   Procedure: BIOPSY;  Surgeon: Rogene Houston, MD;  [facility];  Service: Endoscopy;;  gastric  . BIOPSY BREAST  2010   Left  . Bladder Tack  2002  . BREAST LUMPECTOMY  2004   Left  . BREAST LUMPECTOMY  2004   Left  . BREAST LUMPECTOMY  2006   Left  . BREAST LUMPECTOMY  2000   Left  . BREAST LUMPECTOMY WITH RADIOACTIVE SEED AND SENTINEL LYMPH NODE BIOPSY Left 09/11/2017   Procedure: LEFT BREAST LUMPECTOMY X2 WITH RADIOACTIVE SEED AND LEFT AXILLARY SENTINEL LYMPH NODE BIOPSY;  Surgeon: Excell Seltzer, MD;  Location: [place];  Service: General;  Laterality: Left;  . BUNIONECTOMY  1994   Right  . CATARACT EXTRACTION W/PHACO Left 08/18/2013   Procedure: CATARACT EXTRACTION PHACO AND INTRAOCULAR LENS  PLACEMENT (IOC);  Surgeon: Tonny Branch, MD;  Location: [facility];  Service: Ophthalmology;  Laterality: Left;  CDE: 8.62  . COLONOSCOPY N/A 11/10/2013   Procedure: COLONOSCOPY;  Surgeon: Rogene Houston, MD;  [facility];  Service: Endoscopy;  Laterality: N/A;  240  . COLONOSCOPY N/A 12/30/2018   Procedure: COLONOSCOPY;  Surgeon: Rogene Houston, MD;  [facility];  Service: Endoscopy;  Laterality: N/A;  12  . ESOPHAGOGASTRODUODENOSCOPY N/A 02/26/2018   Procedure: ESOPHAGOGASTRODUODENOSCOPY (EGD);  Surgeon: Rogene Houston, MD;  [facility];  Service: Endoscopy;  Laterality: N/A;  1055  . EYE SURGERY  2011   Catarct Right  . EYE SURGERY  08-2011   Right Yag procedure  . JOINT REPLACEMENT  08-2010   Left total knee  . KNEE ARTHROSCOPY  2010   Right  . KNEE ARTHROSCOPY  2003   Left  . POLYPECTOMY  12/30/2018   Procedure: POLYPECTOMY;  Surgeon: Rogene Houston, MD;  [facility];  Service: Endoscopy;;  hepatic flexure,transverse colon    . PORTACATH PLACEMENT N/A 09/11/2017   Procedure: INSERTION PORT-A-CATH;  Surgeon: Excell Seltzer, MD;  Location: [place];  Service: General;  Laterality: N/A;  . TOTAL KNEE ARTHROPLASTY  11/03/2011   Procedure: TOTAL KNEE ARTHROPLASTY;  Surgeon:  Gearlean Alf, MD;  Location[facility];  Service: Orthopedics;  Laterality: Right;  . TUBAL LIGATION    . WRIST SURGERY Bilateral 03-2010  . WRIST SURGERY  2008   Right    Home Medications:  Allergies as of 06/03/2019      Reactions   Fiorinal-codeine #3 [butalbital-asa-caff-codeine] Rash   Lansoprazole Swelling   Latex Swelling, Other (See Comments), Itching, Rash   blisters   Butalbital-aspirin-caffeine Rash   Sulfa Antibiotics Rash   Femara [letrozole] Rash   Rash when tried in 1984   Sulfasalazine Rash      Medication List       Accurate as of Jun 03, 2019 10:46 AM. If you have any questions, ask your nurse or doctor.        acetaminophen 325 MG  tablet Commonly known as: TYLENOL Take 325 mg by mouth every 6 (six) hours as needed (pain).   atorvastatin 10 MG tablet Commonly known as: LIPITOR Take 10 mg by mouth at bedtime.   bismuth subsalicylate [id number] suspension Commonly known as: PEPTO BISMOL Take 30 mLs by mouth every 6 (six) hours as needed for indigestion.   calcium-vitamin D 500-200 MG-UNIT Tabs tablet Commonly known as: OSCAL WITH D Take by mouth.   cephALEXin 500 MG capsule Commonly known as: Keflex Take 1 capsule (500 mg total) by mouth 3 (three) times daily.   cephALEXin 250 MG capsule Commonly known as: KEFLEX Take 250 mg by mouth at bedtime.   dicyclomine 10 MG capsule Commonly known as: BENTYL Take 1 capsule (10 mg total) by mouth 3 (three) times daily as needed for spasms. Patient states that she started this 3 weeks ago   escitalopram 5 MG tablet Commonly known as: LEXAPRO Take 5 mg by mouth at bedtime.   hydrochlorothiazide 25 MG tablet Commonly known as: HYDRODIURIL Take 25 mg by mouth daily.   hydrochlorothiazide 12.5 MG capsule Commonly known as: MICROZIDE Take 12.5 mg by mouth daily.   hydroxypropyl methylcellulose / hypromellose 2.5 % ophthalmic solution Commonly known as: ISOPTO TEARS / GONIOVISC Place 1 drop into both eyes 3 (three) times daily as needed for dry eyes.   ibuprofen 800 MG tablet Commonly known as: ADVIL Take 1 tablet (800 mg total) by mouth every 8 (eight) hours as needed.   Icy Hot 5 % Pads Generic drug: Menthol (Topical Analgesic) Apply 1 patch topically daily as needed (pain).   ketoconazole 2 % cream Commonly known as: NIZORAL   loperamide 2 MG capsule Commonly known as: IMODIUM Take 2 mg by mouth as needed for diarrhea or loose stools.   meloxicam 15 MG tablet Commonly known as: MOBIC Take 15 mg by mouth daily.   methocarbamol 500 MG tablet Commonly known as: Robaxin Take 1 tablet (500 mg total) by mouth 4 (four) times daily.        Allergies:  Allergies  Allergen Reactions  . Fiorinal-Codeine #3 [Butalbital-Asa-Caff-Codeine] Rash  . Lansoprazole Swelling  . Latex Swelling, Other (See Comments), Itching and Rash    blisters  . Butalbital-Aspirin-Caffeine Rash  . Sulfa Antibiotics Rash  . Femara [Letrozole] Rash    Rash when tried in 1984  . Sulfasalazine Rash    Family History: Family History  Problem Relation Age of Onset  . Breast cancer Sister 36  . Lymphoma Maternal Aunt     Social History:  reports that she has never smoked. She has never used smokeless tobacco. She reports that she does not drink alcohol or  use drugs.  ROS: All other review of systems were reviewed and are negative except what is noted above in HPI  Physical Exam: BP 121/78   Pulse 72   Temp (!) 97 F (36.1 C)   Ht 5\' 3"  (1.6 m)   Wt 210 lb (95.3 kg)   BMI 37.20 kg/m   Constitutional:  Alert and oriented, No acute distress. HEENT: [place] AT, moist mucus membranes.  Trachea midline, no masses. Cardiovascular: No clubbing, cyanosis, or edema. Respiratory: Normal respiratory effort, no increased work of breathing. GI: Abdomen is soft, nontender, nondistended, no abdominal masses GU: No CVA tenderness.  Lymph: No cervical or inguinal lymphadenopathy. Skin: No rashes, bruises or suspicious lesions. Neurologic: Grossly intact, no focal deficits, moving all 4 extremities. Psychiatric: Normal mood and affect.  Laboratory Data: Lab Results  Component Value Date   WBC 5.8 10/25/2018   HGB 13.3 10/25/2018   HCT 39.3 10/25/2018   MCV 92.3 10/25/2018   PLT 155 10/25/2018    Lab Results  Component Value Date   CREATININE 0.87 10/25/2018    No results found for: PSA  No results found for: TESTOSTERONE  No results found for: HGBA1C  Urinalysis    Component Value Date/Time   COLORURINE YELLOW 12/03/2018 1731   APPEARANCEUR HAZY (A) 12/03/2018 1731   LABSPEC 1.018 12/03/2018 1731   PHURINE 5.0 12/03/2018 1731    GLUCOSEU NEGATIVE 12/03/2018 1731   HGBUR SMALL (A) 12/03/2018 1731   BILIRUBINUR neg 03/04/2019 1046   [place] 12/03/2018 1731   PROTEINUR Negative 03/04/2019 1046   PROTEINUR NEGATIVE 12/03/2018 1731   UROBILINOGEN 0.2 03/04/2019 1046   UROBILINOGEN 0.2 10/28/2011 1315   NITRITE neg 03/04/2019 1046   NITRITE POSITIVE (A) 12/03/2018 1731   LEUKOCYTESUR Negative 03/04/2019 1046   LEUKOCYTESUR MODERATE (A) 12/03/2018 1731    Lab Results  Component Value Date   BACTERIA MANY (A) 12/03/2018    Pertinent Imaging:  No results found for this or any previous visit. No results found for this or any previous visit. No results found for this or any previous visit. No results found for this or any previous visit. Results for orders placed during the hospital encounter of 12/14/17  US RENAL   Narrative CLINICAL DATA:  Possible cyst in the right ovary  EXAM: RENAL / URINARY TRACT ULTRASOUND COMPLETE  COMPARISON:  Limited views of the kidneys from a cardiac research MRI dated October 15, 2017.  FINDINGS: Right Kidney:  Renal measurements: 10.7 x 5.8 x 5 cm = volume: 162 mL. The renal cortical echotexture remains lower than that of the liver. There is moderate hydronephrosis.  Left Kidney:  Renal measurements: 12.3 x 5.9 x 5.4 cm = volume: 204 mL. There is no left-sided hydronephrosis. There is a large upper pole cyst measuring 7 x 6.1 x 7.3 cm. No internal echoes are observed. The cortical echotexture is similar to that on the right.  Bladder:  The partially distended urinary bladder is normal. There is a moderate-sized postvoid residual volume. No ureteral jets were observed.  IMPRESSION: Moderate right-sided hydronephrosis.  No left-sided hydronephrosis.  Large simple appearing exophytic left upper pole cyst measuring 7.3 cm in greatest dimension.  Moderate-sized postvoid residual urinary bladder volume.   Electronically Signed   By: David  Martinique M.D.    On: 12/15/2017 11:36    No results found for this or any previous visit. No results found for this or any previous visit. No results found for this or any previous  visit.  Assessment & Plan:    1. Urinary tract infection without hematuria, site unspecified -continue keflex 250mg  qhs -diflucan prn for candidiasis - POCT urinalysis dipstick  2. OAB (overactive bladder) Mirabegron 25mg  daily    No follow-ups on file.  Nicolette Bang, MD  [facility] [place]

## 2019-06-03 NOTE — Progress Notes (Signed)
Urological Symptom Review  Patient is experiencing the following symptoms: Hard to postpone urination Get up at night to urinate Urinary tract infection   Review of Systems  Gastrointestinal (upper)  : Negative for upper GI symptoms  Gastrointestinal (lower) : Diarrhea  Constitutional : Night Sweats  Skin: Skin rash/lesion Itching  Eyes: Negative for eye symptoms  Ear/Nose/Throat : Negative for Ear/Nose/Throat symptoms  Hematologic/Lymphatic: None  Cardiovascular : Negative for cardiovascular symptoms  Respiratory : Negative for respiratory symptoms  Endocrine: Excessive thirst  Musculoskeletal: Back pain  Neurological: Negative for neurological symptoms  Psychologic: Negative for psychiatric symptoms

## 2019-06-03 NOTE — Patient Instructions (Signed)

## 2019-06-16 DIAGNOSIS — D225 Melanocytic nevi of trunk: Secondary | ICD-10-CM | POA: Diagnosis not present

## 2019-06-16 DIAGNOSIS — D485 Neoplasm of uncertain behavior of skin: Secondary | ICD-10-CM | POA: Diagnosis not present

## 2019-06-16 DIAGNOSIS — D1801 Hemangioma of skin and subcutaneous tissue: Secondary | ICD-10-CM | POA: Diagnosis not present

## 2019-06-16 DIAGNOSIS — L820 Inflamed seborrheic keratosis: Secondary | ICD-10-CM | POA: Diagnosis not present

## 2019-07-27 ENCOUNTER — Telehealth: Payer: Self-pay | Admitting: Hematology and Oncology

## 2019-07-27 NOTE — Progress Notes (Signed)
HEMATOLOGY-ONCOLOGY [facility] VIDEO VISIT PROGRESS NOTE  I connected with Sylvia Barnett on 07/28/2019 at 11:30 AM EDT by MyChart video conference and verified that I am speaking with the correct person using two identifiers.  I discussed the limitations, risks, security and privacy concerns of performing an evaluation and management service by MyChart and the availability of in person appointments.  I also discussed with the patient that there may be a patient responsible charge related to this service. The patient expressed understanding and agreed to proceed.  Patient's Location: Home Physician Location: Clinic  CHIEF COMPLIANT: Follow-up of left breast cancer on letrozole  INTERVAL HISTORY: Sylvia Barnett is a 74 y.o. female with above-mentioned history of left breast cancer treated with lumpectomy,adjuvant chemotherapy,radiation, Herceptin maintenance, and who is currently on anti-estrogen therapy with letrozole, after she could not tolerate anastrozole. She is a participant in the [employer] clinical trial. Bone scan on 05/13/19 showed no evidence of osseous metastases. Shepresents over [place] clinic today for a toxicity check. She was in [place] for muscle spasms. (axilalry discomfort).   Oncology History  Malignant neoplasm of upper-outer quadrant of left breast in female, estrogen receptor positive ([place])  08/05/2017 Initial Diagnosis   Screening detected left breast calcifications UOQ 1.1 cm biopsy revealed invasive lobular cancer with LCIS and CSL, grade 2, ER 90%, PR 0%, Ki-67 1%, HER-2 positive ratio 2.62, T2N0 stage Ia clinical stage AJCC 8   08/12/2017 Cancer Staging   Staging form: Breast, AJCC 8th Edition - Clinical stage from 08/12/2017: Stage IA (cT1c, cN0, cM0, G2, ER+, PR-, HER2+) - Signed by Nicholas Lose, MD on 08/12/2017   09/11/2017 Surgery   Left lumpectomy: Grade 2 invasive lobular cancer, 1.1 cm, LCIS, margins negative, 0/2 lymph nodes negative, ER 90% strong staining,  PR negative, HER-2 positive ratio 2.62, Ki-67 1%, T1c N0 stage Ia   09/23/2017 Cancer Staging   Staging form: Breast, AJCC 8th Edition - Pathologic: Stage IA (pT1c, pN0, cM0, G2, ER+, PR-, HER2+) - Signed by Gardenia Phlegm, NP on 09/23/2017   11/02/2017 -  Chemotherapy   Adjuvant therapy with Taxol Herceptin weekly x12 followed by Herceptin maintenance for 1 year   02/25/2018 - 03/23/2018 Radiation Therapy   Adjuvant Radiation in [place], [place] [facility]).  Left Breast 42.56 Gy   05/03/2018 -  Anti-estrogen oral therapy   Anastrozole, 71m daily (plan for 7 years), discontinued 02/24/19 due to joint aches and pains    Observations/Objective:  There were no vitals filed for this visit. There is no height or weight on file to calculate BMI.  I have reviewed the data as listed CMP Latest Ref Rng & Units 10/25/2018 09/06/2018 07/26/2018  Glucose 70 - 99 mg/dL 100(H) 119(H) 110(H)  BUN 8 - 23 mg/dL _0 Creatinine 0.44 - 1.00 mg/dL 0.87 0.82 0.95  Sodium 135 - 145 mmol/L 145 144 145  Potassium 3.5 - 5.1 mmol/L 3.7 3.8 3.7  Chloride 98 - 111 mmol/L 105 109 110  CO2 22 - 32 mmol/L _1 Calcium 8.9 - 10.3 mg/dL 9.4 9.0 8.8(L)  Total Protein 6.5 - 8.1 g/dL 6.8 6.4(L) 6.6  Total Bilirubin 0.3 - 1.2 mg/dL 0.5 0.4 0.3  Alkaline Phos 38 - 126 U/L 92 93 88  AST 15 - 41 U/L 13(L) 15 16  ALT 0 - 44 U/L _2 Lab Results  Component Value Date   WBC 5.8 10/25/2018   HGB 13.3 10/25/2018   HCT  39.3 10/25/2018   MCV 92.3 10/25/2018   PLT 155 10/25/2018   NEUTROABS 4.0 10/25/2018      Assessment Plan:  Malignant neoplasm of upper-outer quadrant of left breast in female, estrogen receptor positive ([place]) 09/11/2017:Left lumpectomy: Grade 2 invasive lobular cancer, 1.1 cm, LCIS, margins negative, 0/2 lymph nodes negative, ER 90% strong staining, PR negative, HER-2 positive ratio 2.62, Ki-67 1%, T1c N0 stage Ia  Recommendation: 1.Adjuvant therapy with Taxol Herceptin weekly  x12 followed by Herceptin maintenance for 1 year completed October 2020 2.Adjuvant radiationcompleted 03/23/2018 3.Followed by adjuvant antiestrogen therapystarted 05/03/2018 ------------------------------------------------------------------ Current treatment:antiestrogen therapy with anastrozole 1 mg daily4/20/2020 tried letrozole but couldn't tolerate it, Recommended Exemestane (patient will let us know if she wants to try it) Chemo-induced peripheral neuropathy:Stable to improving  Diffuse musculoskeletal pains across the chest: Recent hospitalization at [facility].  Cardiac stress test was performed which was negative.  Bone scan negative for metastasis 05/13/2019.  Breast Cancer Surveillance: 1. Breast Exam: 04/27/2019: Benign 2. Mammogram: 02/09/19: Benign Density Cat B [facility]) 3. Bone Density: 02/16/19: T score -1.4 Osteopenia: On Ca and Vit D 4.  Bone scan 05/13/2019: No evidence of bone metastases  3 month f/u if she takes exemestane if not we can see her in 1 year After reviewing the drug information on exemestane, patient decided that she does not want to take it. At this time she will not be on any further antiestrogens.    I discussed the assessment and treatment plan with the patient. The patient was provided an opportunity to ask questions and all were answered. The patient agreed with the plan and demonstrated an understanding of the instructions. The patient was advised to call back or seek an in-person evaluation if the symptoms worsen or if the condition fails to improve as anticipated.   I provided 30 minutes of face-to-face MyChart video visit time during this encounter.    Rulon Eisenmenger, MD 07/28/2019   I, Molly Dorshimer, am acting as scribe for Nicholas Lose, MD.  I have reviewed the above documentation for accuracy and completeness, and I agree with the above.

## 2019-07-28 ENCOUNTER — Telehealth: Payer: Self-pay | Admitting: Hematology and Oncology

## 2019-07-28 ENCOUNTER — Inpatient Hospital Stay: Payer: Medicare Other | Attending: Hematology and Oncology | Admitting: Hematology and Oncology

## 2019-07-28 ENCOUNTER — Telehealth: Payer: Self-pay | Admitting: *Deleted

## 2019-07-28 DIAGNOSIS — C50412 Malignant neoplasm of upper-outer quadrant of left female breast: Secondary | ICD-10-CM

## 2019-07-28 DIAGNOSIS — Z170 Estrogen receptor positive status [ER+]: Secondary | ICD-10-CM | POA: Diagnosis not present

## 2019-07-28 NOTE — Assessment & Plan Note (Signed)
09/11/2017:Left lumpectomy: Grade 2 invasive lobular cancer, 1.1 cm, LCIS, margins negative, 0/2 lymph nodes negative, ER 90% strong staining, PR negative, HER-2 positive ratio 2.62, Ki-67 1%, T1c N0 stage Ia  Recommendation: 1.Adjuvant therapy with Taxol Herceptin weekly x12 followed by Herceptin maintenance for 1 year completed October 2020 2.Adjuvant radiationcompleted 03/23/2018 3.Followed by adjuvant antiestrogen therapystarted 05/03/2018 ------------------------------------------------------------------ Current treatment:antiestrogen therapy with anastrozole 1 mg daily4/20/2020 Chemo-induced peripheral neuropathy:Stable to improving Chemotherapy-induced memory loss: We talked about remember study. She may do this once treatment is complete  Anastrozoletoxicities:Muscle aches and pains because of which she discontinued anastrozole therapy. I discussed the pros and cons of trying letrozole.  She is willing to give that a try.  We will start her at half a tablet daily.  Diffuse musculoskeletal pains across the chest: Recent hospitalization at [facility].  Cardiac stress test was performed which was negative.  Bone scan negative for metastasis 05/13/2019.  Breast Cancer Surveillance: 1. Breast Exam: 04/27/2019: Benign 2. Mammogram: 02/09/19: Benign Density Cat B ([facility]) 3. Bone Density: 02/16/19: T score -1.4 Osteopenia: On Ca and Vit D 4.  Bone scan 05/13/2019: No evidence of bone metastases  Letrozole toxicities:  Return to clinic in 1 year for follow-up 

## 2019-07-28 NOTE — Telephone Encounter (Signed)
Scheduled per 7/15 los, called and spoke with pt, confirmed 7/15 appt

## 2019-07-28 NOTE — Telephone Encounter (Signed)
Received call from pt stating after reviewing side effects of exemestane, she does not want to proceed with taking it. RN alerted MD.  Per MD, okay for pt to be off antiestrogen therapy due to pt not being able to tolerate side effects.  States pt will need to f/u in 1 year.  Scheduling message sent.

## 2019-08-02 ENCOUNTER — Encounter: Payer: Self-pay | Admitting: General Surgery

## 2019-08-02 ENCOUNTER — Other Ambulatory Visit: Payer: Self-pay

## 2019-08-02 ENCOUNTER — Ambulatory Visit (INDEPENDENT_AMBULATORY_CARE_PROVIDER_SITE_OTHER): Payer: Medicare Other | Admitting: General Surgery

## 2019-08-02 VITALS — BP 141/76 | HR 76 | Temp 98.1°F | Resp 16 | Ht 63.0 in | Wt 214.0 lb

## 2019-08-02 DIAGNOSIS — K4090 Unilateral inguinal hernia, without obstruction or gangrene, not specified as recurrent: Secondary | ICD-10-CM | POA: Insufficient documentation

## 2019-08-02 NOTE — Progress Notes (Signed)
[facility] History and Physical  Reason for Referral: Right inguinal hernia  Referring Physician:  Dr. Laural Golden  Chief Complaint    New Patient (Initial Visit); Hernia      Sylvia Barnett is a 74 y.o. female.  HPI: Sylvia Barnett who was recently seen by Dr. Laural Golden and had noticed a bulge in the shower over her right groin for the past few months. She denies any discomfort or issues with the bulge and denies any pain. She notices the bulge and says that it is larger at times. She was seeing Dr. Laural Golden in following up given her history of colitis and she noted the hernia and he was able to appreciate it on exam.   She says since seeing him that nothing has changed, and she still just notices the bulge. She has never had obstructive symptoms. She is here to discuss her options.   Past Medical History:  Diagnosis Date  . Anxiety    Since in her 20's  . Arthritis 2013   Right hip  . Cancer [facility]) 2004    Breast Lumpectomy  . Complication of anesthesia    Has a small throat  . Depression    Started when she was in her 57's  . Hypercholesteremia 2012  . Hypertension 2013  . IBS (irritable bowel syndrome) 2018   per GI note  . Incontinence-urinary 2012  . Lobular carcinoma in situ (LCIS) of left breast 09/08/2008  . Melanoma of back ([place]) 1994   Excision with clear margins  . Microscopic colitis 2015   Resolved 2016    Past Surgical History:  Procedure Laterality Date  . ABDOMINAL HYSTERECTOMY  2002  . APPENDECTOMY  1949  . BIOPSY  02/26/2018   Procedure: BIOPSY;  Surgeon: Rogene Houston, MD;  [facility];  Service: Endoscopy;;  gastric  . BIOPSY BREAST  2010   Left  . Bladder Tack  2002  . BREAST LUMPECTOMY  2004   Left  . BREAST LUMPECTOMY  2004   Left  . BREAST LUMPECTOMY  2006   Left  . BREAST LUMPECTOMY  2000   Left  . BREAST LUMPECTOMY WITH RADIOACTIVE SEED AND SENTINEL LYMPH NODE BIOPSY Left 09/11/2017   Procedure: LEFT BREAST  LUMPECTOMY X2 WITH RADIOACTIVE SEED AND LEFT AXILLARY SENTINEL LYMPH NODE BIOPSY;  Surgeon: Excell Seltzer, MD;  Location: [place];  Service: General;  Laterality: Left;  . BUNIONECTOMY  1994   Right  . CATARACT EXTRACTION W/PHACO Left 08/18/2013   Procedure: CATARACT EXTRACTION PHACO AND INTRAOCULAR LENS PLACEMENT (IOC);  Surgeon: Tonny Branch, MD;  Location: [facility];  Service: Ophthalmology;  Laterality: Left;  CDE: 8.62  . COLONOSCOPY N/A 11/10/2013   Procedure: COLONOSCOPY;  Surgeon: Rogene Houston, MD;  [facility];  Service: Endoscopy;  Laterality: N/A;  240  . COLONOSCOPY N/A 12/30/2018   Procedure: COLONOSCOPY;  Surgeon: Rogene Houston, MD;  [facility];  Service: Endoscopy;  Laterality: N/A;  12  . ESOPHAGOGASTRODUODENOSCOPY N/A 02/26/2018   Procedure: ESOPHAGOGASTRODUODENOSCOPY (EGD);  Surgeon: Rogene Houston, MD;  [facility];  Service: Endoscopy;  Laterality: N/A;  1055  . EYE SURGERY  2011   Catarct Right  . EYE SURGERY  08-2011   Right Yag procedure  . JOINT REPLACEMENT  08-2010   Left total knee  . KNEE ARTHROSCOPY  2010   Right  . KNEE ARTHROSCOPY  2003   Left  . POLYPECTOMY  12/30/2018  Procedure: POLYPECTOMY;  Surgeon: Rogene Houston, MD;  [facility];  Service: Endoscopy;;  hepatic flexure,transverse colon    . PORTACATH PLACEMENT N/A 09/11/2017   Procedure: INSERTION PORT-A-CATH;  Surgeon: Excell Seltzer, MD;  Location: [place];  Service: General;  Laterality: N/A;  . TOTAL KNEE ARTHROPLASTY  11/03/2011   Procedure: TOTAL KNEE ARTHROPLASTY;  Surgeon: Gearlean Alf, MD;  Location: [facility];  Service: Orthopedics;  Laterality: Right;  . TUBAL LIGATION    . WRIST SURGERY Bilateral 03-2010  . WRIST SURGERY  2008   Right    Family History  Problem Relation Age of Onset  . Breast cancer Sister 60  . Lymphoma Maternal Aunt     Social History   Tobacco Use  . Smoking status: Never Smoker  . Smokeless tobacco:  Never Used  Vaping Use  . Vaping Use: Never assessed  Substance Use Topics  . Alcohol use: No    Alcohol/week: 0.0 standard drinks  . Drug use: No    Medications: I have reviewed the patient's current medications. Allergies as of 08/02/2019      Reactions   Fiorinal-codeine #3 [butalbital-asa-caff-codeine] Rash   Lansoprazole Swelling   Latex Swelling, Other (See Comments), Itching, Rash   blisters   Butalbital-aspirin-caffeine Rash   Sulfa Antibiotics Rash   Femara [letrozole] Rash   Rash when tried in 1984   Sulfasalazine Rash      Medication List       Accurate as of August 02, 2019 10:03 AM. If you have any questions, ask your nurse or doctor.        STOP taking these medications   acetaminophen 325 MG tablet Commonly known as: TYLENOL Stopped by: Virl Cagey, MD   dicyclomine 10 MG capsule Commonly known as: BENTYL Stopped by: Virl Cagey, MD   fluconazole 150 MG tablet Commonly known as: DIFLUCAN Stopped by: Virl Cagey, MD   meloxicam 15 MG tablet Commonly known as: MOBIC Stopped by: Virl Cagey, MD   methocarbamol 500 MG tablet Commonly known as: Robaxin Stopped by: Virl Cagey, MD   mirabegron ER 25 MG Tb24 tablet Commonly known as: MYRBETRIQ Stopped by: Virl Cagey, MD     TAKE these medications   atorvastatin 10 MG tablet Commonly known as: LIPITOR Take 10 mg by mouth at bedtime.   bismuth subsalicylate 814 GY/18HU suspension Commonly known as: PEPTO BISMOL Take 30 mLs by mouth every 6 (six) hours as needed for indigestion.   calcium-vitamin D 500-200 MG-UNIT Tabs tablet Commonly known as: OSCAL WITH D Take by mouth.   cephALEXin 250 MG capsule Commonly known as: KEFLEX Take 1 capsule (250 mg total) by mouth at bedtime. What changed: Another medication with the same name was removed. Continue taking this medication, and follow the directions you see here. Changed by: Virl Cagey, MD     escitalopram 5 MG tablet Commonly known as: LEXAPRO Take 5 mg by mouth at bedtime.   hydrochlorothiazide 12.5 MG capsule Commonly known as: MICROZIDE Take 12.5 mg by mouth daily. What changed: Another medication with the same name was removed. Continue taking this medication, and follow the directions you see here. Changed by: Virl Cagey, MD   hydroxypropyl methylcellulose / hypromellose 2.5 % ophthalmic solution Commonly known as: ISOPTO TEARS / GONIOVISC Place 1 drop into both eyes 3 (three) times daily as needed for dry eyes.   ibuprofen 800 MG tablet Commonly known as: ADVIL Take 1  tablet (800 mg total) by mouth every 8 (eight) hours as needed.   Icy Hot 5 % Pads Generic drug: Menthol (Topical Analgesic) Apply 1 patch topically daily as needed (pain).   ketoconazole 2 % cream Commonly known as: NIZORAL   loperamide 2 MG capsule Commonly known as: IMODIUM Take 2 mg by mouth as needed for diarrhea or loose stools.        ROS:  A comprehensive review of systems was negative except for: Gastrointestinal: positive for right groin hernia Musculoskeletal: positive for back pain, neck pain and joint pain Endocrine: positive for tired/ sluggish  Blood pressure (!) 141/76, pulse 76, temperature 98.1 F (36.7 C), temperature source Oral, resp. rate 16, height 5\' 3"  (1.6 m), weight 214 lb (97.1 kg), SpO2 94 %. Physical Exam Vitals reviewed.  Constitutional:      Appearance: Normal appearance.  HENT:     Head: Normocephalic and atraumatic.     Mouth/Throat:     Mouth: Mucous membranes are moist.  Eyes:     Extraocular Movements: Extraocular movements intact.     Pupils: Pupils are equal, round, and reactive to light.  Cardiovascular:     Rate and Rhythm: Normal rate and regular rhythm.  Pulmonary:     Effort: Pulmonary effort is normal.     Breath sounds: Normal breath sounds.  Abdominal:     General: There is no distension.     Palpations: Abdomen is soft.      Tenderness: There is no abdominal tenderness.     Hernia: A hernia is present. Hernia is present in the right inguinal area.     Comments: Reducible hernia, non tender   Musculoskeletal:        General: No swelling. Normal range of motion.     Cervical back: Normal range of motion.  Skin:    General: Skin is warm.  Neurological:     Mental Status: She is alert and oriented to person, place, and time.  Psychiatric:        Mood and Affect: Mood normal.        Behavior: Behavior normal.        Thought Content: Thought content normal.     Results: None   Assessment & Plan:  Sylvia Barnett is a 74 y.o. female with a asymptomatic right inguinal hernia. We discussed the pathophysiology and reasons for repair. We discussed the risk of incarceration, strangulation, and reasons to go to the ED.   Discussed the risk and benefits including, bleeding, infection, use of mesh, risk of recurrence, risk of nerve damage causing numbness or changes in sensation, risk of damage to the cord structures. The patient understands the risk and benefits of repair with mesh, and has decided to proceed.  We also discussed open versus laparoscopic surgery and the use of mesh. We discussed that I do open repairs with mesh, and that this is considered equivalent to laparoscopic surgery. We discussed reasons for opting for laparoscopic surgery including if a bilateral repair is needed or if a patient has a recurrence after an open repair.  She is going to think about her options but is going to hold on repair right now.      Virl Cagey 08/02/2019, 10:03 AM

## 2019-08-02 NOTE — Patient Instructions (Signed)
Inguinal Hernia, Adult An inguinal hernia is when fat or your intestines push through a weak spot in a muscle where your leg meets your lower belly (groin). This causes a rounded lump (bulge). This kind of hernia could also be:  In your scrotum, if you are female.  In folds of skin around your vagina, if you are female. There are three types of inguinal hernias. These include:  Hernias that can be pushed back into the belly (are reducible). This type rarely causes pain.  Hernias that cannot be pushed back into the belly (are incarcerated).  Hernias that cannot be pushed back into the belly and lose their blood supply (are strangulated). This type needs emergency surgery. If you do not have symptoms, you may not need treatment. If you have symptoms or a large hernia, you may need surgery. Follow these instructions at home: Lifestyle  Do these things if told by your doctor so you do not have trouble pooping (constipation): ? Drink enough fluid to keep your pee (urine) pale yellow. ? Eat foods that have a lot of fiber. These include fresh fruits and vegetables, whole grains, and beans. ? Limit foods that are high in fat and processed sugars. These include foods that are fried or sweet. ? Take medicine for trouble pooping.  Avoid lifting heavy objects.  Avoid standing for long amounts of time.  Do not use any products that contain nicotine or tobacco. These include cigarettes and e-cigarettes. If you need help quitting, ask your doctor.  Stay at a healthy weight. General instructions  You may try to push your hernia in by very gently pressing on it when you are lying down. Do not try to force the bulge back in if it will not push in easily.  Watch your hernia for any changes in shape, size, or color. Tell your doctor if you see any changes.  Take over-the-counter and prescription medicines only as told by your doctor.  Keep all follow-up visits as told by your doctor. This is  important. Contact a doctor if:  You have a fever.  You have new symptoms.  Your symptoms get worse. Get help right away if:  The area where your leg meets your lower belly has: ? Pain that gets worse suddenly. ? A bulge that gets bigger suddenly, and it does not get smaller after that. ? A bulge that turns red or purple. ? A bulge that is painful when you touch it.  You are a man, and your scrotum: ? Suddenly feels painful. ? Suddenly changes in size.  You cannot push the hernia in by very gently pressing on it when you are lying down. Do not try to force the bulge back in if it will not push in easily.  You feel sick to your stomach (nauseous), and that feeling does not go away.  You throw up (vomit), and that keeps happening.  You have a fast heartbeat.  You cannot poop (have a bowel movement) or pass gas. These symptoms may be an emergency. Do not wait to see if the symptoms will go away. Get medical help right away. Call your local emergency services (911 in the [place] Summary  An inguinal hernia is when fat or your intestines push through a weak spot in a muscle where your leg meets your lower belly (groin). This causes a rounded lump (bulge).  If you do not have symptoms, you may not need treatment. If you have symptoms or a large hernia, you   may need surgery.  Avoid lifting heavy objects. Also avoid standing for long amounts of time.  Do not try to force the bulge back in if it will not push in easily. This information is not intended to replace advice given to you by your health care provider. Make sure you discuss any questions you have with your health care provider. Document Revised: 01/31/2017 Document Reviewed: 10/01/2016 Elsevier Patient Education  [place] Repair, Adult  Open hernia repair is a surgical procedure to fix a hernia. A hernia occurs when an internal organ or tissue pushes out through a weak spot in the abdominal wall  muscles. Hernias commonly occur in the groin and around the navel. Most hernias tend to get worse over time. Often, surgery is done to prevent the hernia from becoming bigger, uncomfortable, or an emergency. Emergency surgery may be needed if abdominal contents get stuck in the opening (incarcerated hernia) or the blood supply gets cut off (strangulated hernia). In an open repair, an incision is made in the abdomen to perform the surgery. Tell a health care provider about:  Any allergies you have.  All medicines you are taking, including vitamins, herbs, eye drops, creams, and over-the-counter medicines.  Any problems you or family members have had with anesthetic medicines.  Any blood or bone disorders you have.  Any surgeries you have had.  Any medical conditions you have, including any recent cold or flu symptoms.  Whether you are pregnant or may be pregnant. What are the risks? Generally, this is a safe procedure. However, problems may occur, including:  Long-lasting (chronic) pain.  Bleeding.  Infection.  Damage to the testicle. This can cause shrinking or swelling.  Damage to the bladder, blood vessels, intestine, or nerves near the hernia.  Trouble passing urine.  Allergic reactions to medicines.  Return of the hernia. What happens before the procedure? Medicines  Ask your health care provider about: ? Changing or stopping your regular medicines. This is especially important if you are taking diabetes medicines or blood thinners. ? Taking medicines such as aspirin and ibuprofen. These medicines can thin your blood. Do not take these medicines before your procedure if your health care provider instructs you not to.  You may be given antibiotic medicine to help prevent infection. General instructions  You may have blood tests or imaging studies.  Ask your health care provider how your surgical site will be marked or identified.  If you smoke, do not smoke for at  least 2 weeks before your procedure or for as long as told by your health care provider.  Let your health care provider know if you develop a cold or any infection before your surgery.  Plan to have someone take you home from the hospital or clinic.  If you will be going home right after the procedure, plan to have someone with you for 24 hours. What happens during the procedure?  To reduce your risk of infection: ? Your health care team will wash or sanitize their hands. ? Your skin will be washed with soap. ? Hair may be removed from the surgical area.  An IV tube will be inserted into one of your veins.  You will be given one or more of the following: ? A medicine to help you relax (sedative). ? A medicine to numb the area (local anesthetic). ? A medicine to make you fall asleep (general anesthetic).  Your surgeon will make an incision over the hernia.  The tissues of the hernia will be moved back into place.  The edges of the hernia may be stitched together.  The opening in the abdominal muscles will be closed with stitches (sutures). Or, your surgeon will place a mesh patch made of manmade (synthetic) material over the opening.  The incision will be closed.  A bandage (dressing) may be placed over the incision. The procedure may vary among health care providers and hospitals. What happens after the procedure?  Your blood pressure, heart rate, breathing rate, and blood oxygen level will be monitored until the medicines you were given have worn off.  You may be given medicine for pain.  Do not drive for 24 hours if you received a sedative. This information is not intended to replace advice given to you by your health care provider. Make sure you discuss any questions you have with your health care provider. Document Revised: 12/12/2016 Document Reviewed: 06/13/2015 Elsevier Patient Education  2020 [employer].

## 2019-08-16 ENCOUNTER — Telehealth: Payer: Self-pay

## 2019-08-16 DIAGNOSIS — E7800 Pure hypercholesterolemia, unspecified: Secondary | ICD-10-CM | POA: Diagnosis not present

## 2019-08-16 DIAGNOSIS — R739 Hyperglycemia, unspecified: Secondary | ICD-10-CM | POA: Diagnosis not present

## 2019-08-16 DIAGNOSIS — I10 Essential (primary) hypertension: Secondary | ICD-10-CM | POA: Diagnosis not present

## 2019-08-17 NOTE — Telephone Encounter (Signed)
UPBEAT [id number] - UNDERSTANDING and PREDICTING BREAST CANCER EVENTS AFTER TREATMENT  08/17/2019 9:38AM  TELEPHONE CALL: Fraser Din returns my call from yesterday so we can discuss her upcoming 58-month UPBEAT visit. I introduce myself as a member of the Clinical Research team and confirm that I am speaking with the correct person. Per UPBEAT study protocol, Fraser Din can be seen any time between 09/03/2019 - 01/03/2020. She already has a visit scheduled with Dr. Jeffie Pollock in [place] on Friday, 12/02/2019; since [place] lives in [place], [place], it would be more convenient for her to travel to [place] from [place] rather than having to make a second trip. After some discussion, the plan is for Fraser Din to have breakfast before 9am on Friday, 12/02/2019, prior to her visit with Dr. Jeffie Pollock. I will schedule a lab appointment for study labs around noon that same day (will have been fasting for three hours by noon). Following lab collection, the remaining UPBEAT study requirements will be completed, based on availability of cardiac MRI (to be scheduled). Pat verbalizes agreement with this plan. I will email appointment times once confirmed (per request) and schedule a reminder call prior to our visit. Fraser Din reports she also uses MyChart and will have access to any appointments placed on her schedule. Fraser Din is thanked for her time and continued participation in the UPBEAT study. My direct call back number is provided and she is encouraged to call me with any questions or if there is a change in plan: she verbalizes understanding.  Dionne Bucy. Sharlett Iles, BSN, RN, CIC 08/17/2019 4:13 PM

## 2019-08-18 ENCOUNTER — Other Ambulatory Visit: Payer: Self-pay

## 2019-08-18 ENCOUNTER — Other Ambulatory Visit (HOSPITAL_COMMUNITY): Payer: Self-pay | Admitting: Hematology and Oncology

## 2019-08-18 DIAGNOSIS — Z006 Encounter for examination for normal comparison and control in clinical research program: Secondary | ICD-10-CM

## 2019-08-18 DIAGNOSIS — Z170 Estrogen receptor positive status [ER+]: Secondary | ICD-10-CM

## 2019-08-23 DIAGNOSIS — W1839XA Other fall on same level, initial encounter: Secondary | ICD-10-CM | POA: Diagnosis not present

## 2019-08-23 DIAGNOSIS — E785 Hyperlipidemia, unspecified: Secondary | ICD-10-CM | POA: Diagnosis not present

## 2019-08-23 DIAGNOSIS — F329 Major depressive disorder, single episode, unspecified: Secondary | ICD-10-CM | POA: Diagnosis not present

## 2019-08-23 DIAGNOSIS — M25531 Pain in right wrist: Secondary | ICD-10-CM | POA: Diagnosis not present

## 2019-08-23 DIAGNOSIS — F419 Anxiety disorder, unspecified: Secondary | ICD-10-CM | POA: Diagnosis not present

## 2019-08-23 DIAGNOSIS — S52611A Displaced fracture of right ulna styloid process, initial encounter for closed fracture: Secondary | ICD-10-CM | POA: Diagnosis not present

## 2019-08-23 DIAGNOSIS — S52571A Other intraarticular fracture of lower end of right radius, initial encounter for closed fracture: Secondary | ICD-10-CM | POA: Diagnosis not present

## 2019-08-23 DIAGNOSIS — S62101A Fracture of unspecified carpal bone, right wrist, initial encounter for closed fracture: Secondary | ICD-10-CM | POA: Diagnosis not present

## 2019-08-23 DIAGNOSIS — S6291XA Unspecified fracture of right wrist and hand, initial encounter for closed fracture: Secondary | ICD-10-CM | POA: Diagnosis not present

## 2019-08-24 DIAGNOSIS — S6291XA Unspecified fracture of right wrist and hand, initial encounter for closed fracture: Secondary | ICD-10-CM | POA: Diagnosis not present

## 2019-08-25 DIAGNOSIS — X58XXXA Exposure to other specified factors, initial encounter: Secondary | ICD-10-CM | POA: Diagnosis not present

## 2019-08-25 DIAGNOSIS — G8918 Other acute postprocedural pain: Secondary | ICD-10-CM | POA: Diagnosis not present

## 2019-08-25 DIAGNOSIS — S52571A Other intraarticular fracture of lower end of right radius, initial encounter for closed fracture: Secondary | ICD-10-CM | POA: Diagnosis not present

## 2019-08-25 DIAGNOSIS — W19XXXA Unspecified fall, initial encounter: Secondary | ICD-10-CM | POA: Diagnosis not present

## 2019-08-28 IMAGING — MG MM PLC BREAST LOC DEV 1ST LESION INC MAMMO GUIDE*L*
8 of 10 series · 8 of 10 positions shown · non-contrast
Comparison: Previous exam(s).

CLINICAL DATA: Localization of an upper outer anterior clip
correlating with the site of known cancer. Localization of a
superior medial posterior clip at the site of biopsied
calcifications.

EXAM:
MAMMOGRAPHIC GUIDED RADIOACTIVE SEED LOCALIZATION OF THE LEFT BREAST

[L CC (1 of 2)]
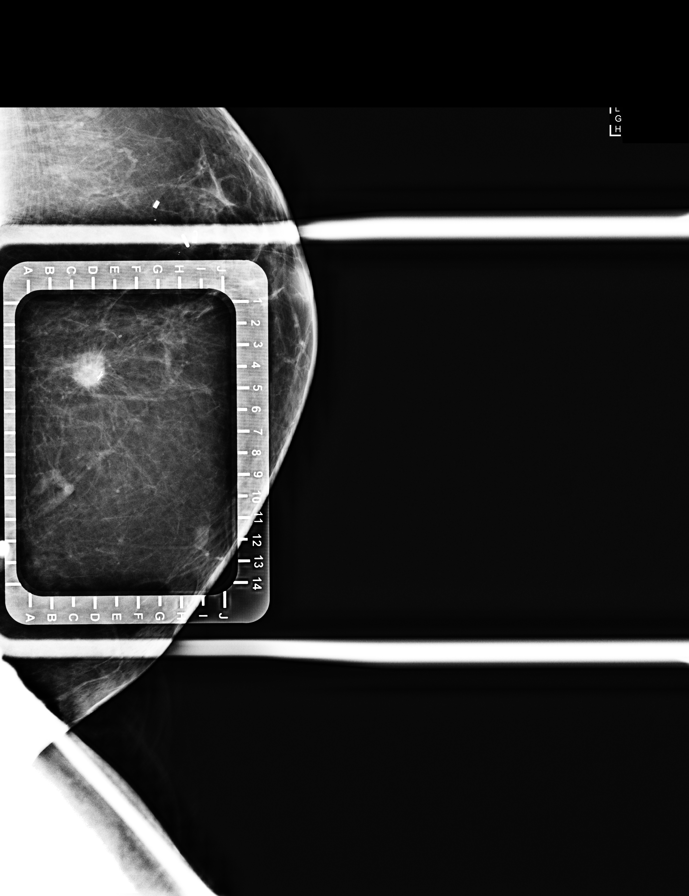

[L ML (1 of 6)]
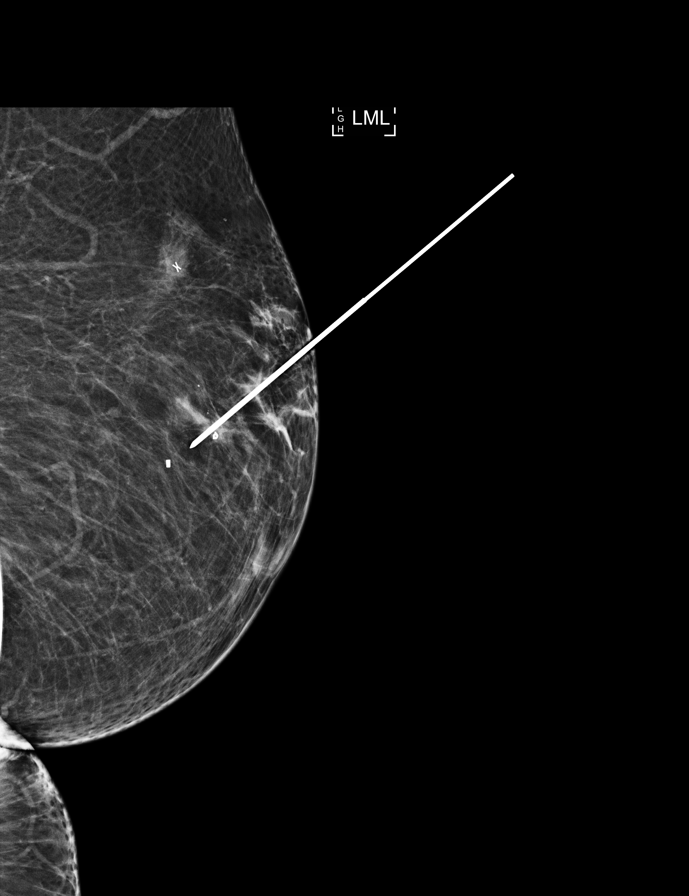

[L ML (2 of 6)]
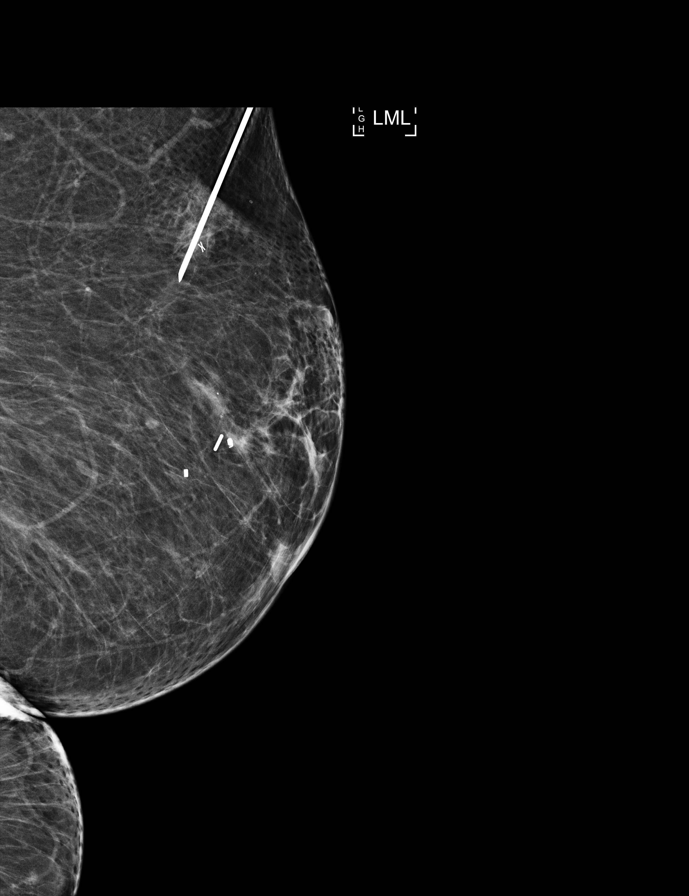

[L CC (2 of 2)]
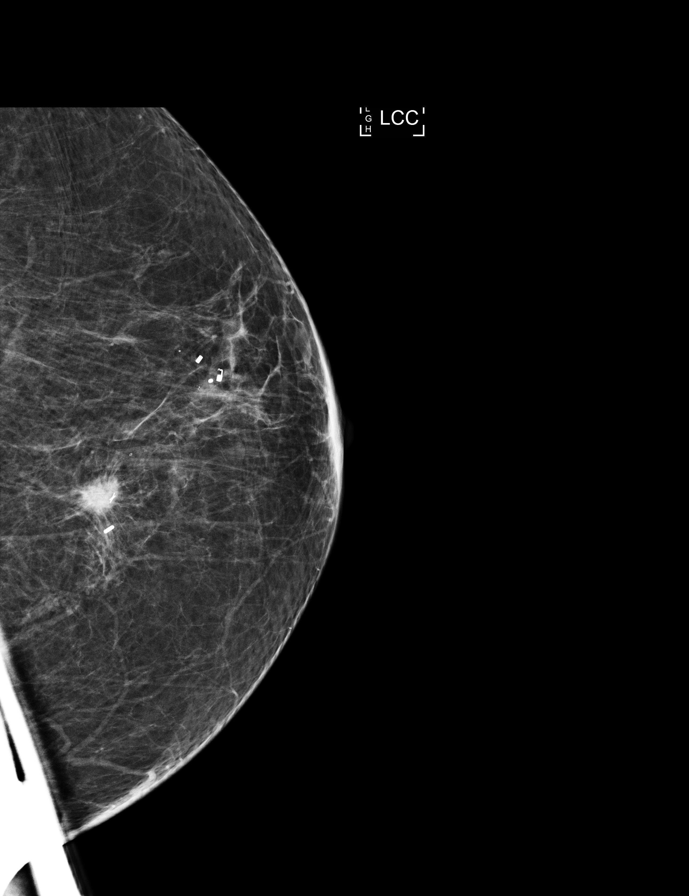

[L ML (3 of 6)]
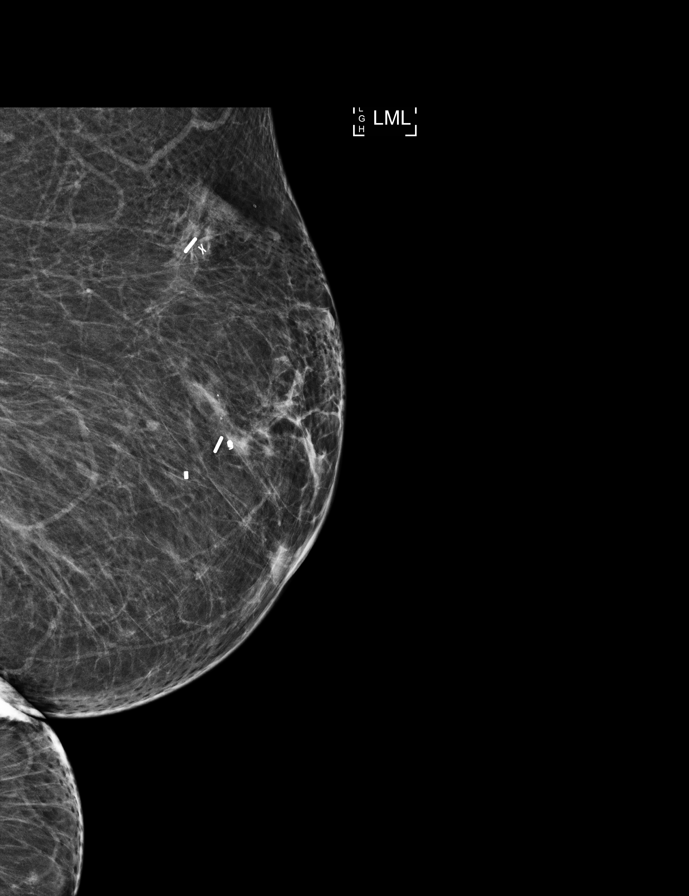

[L ML (4 of 6)]
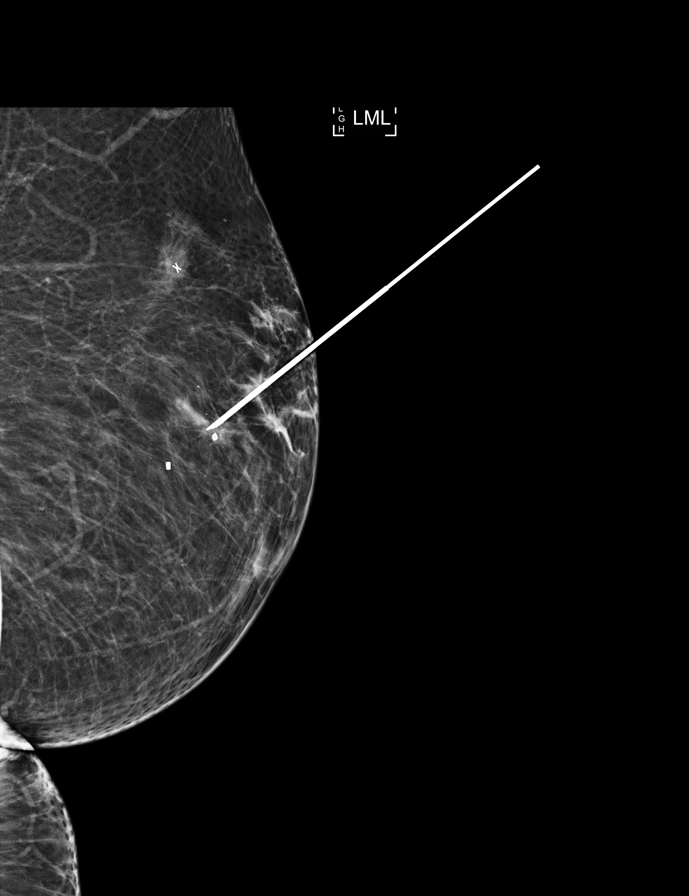

[L ML (5 of 6)]
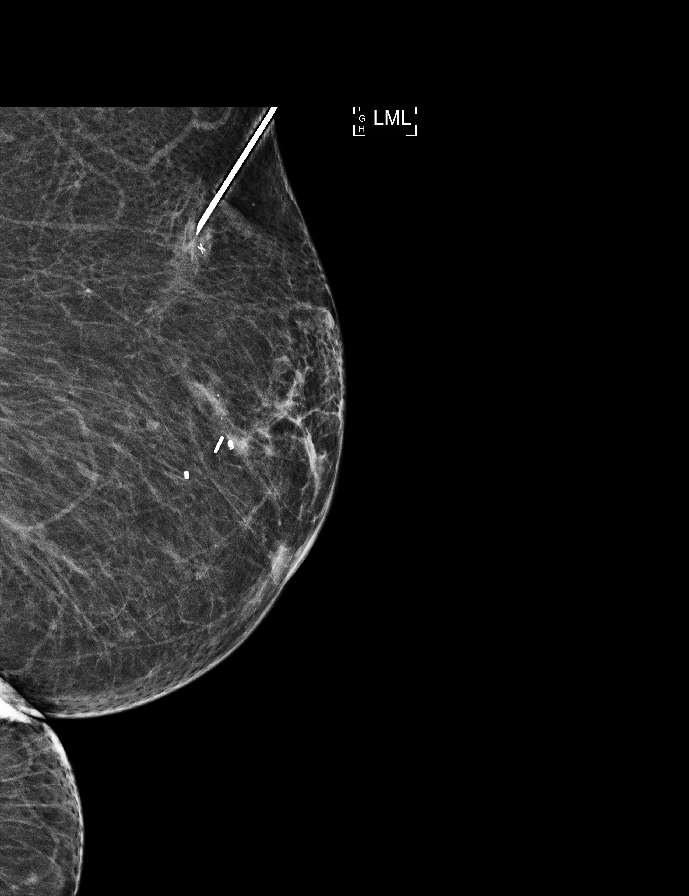

[L ML (6 of 6)]
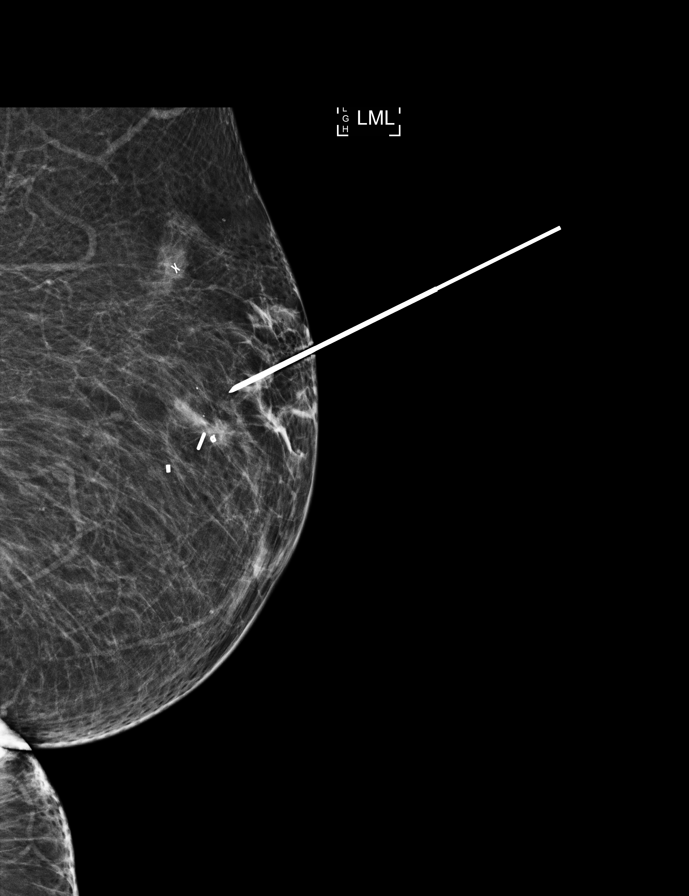

[8 of 10 positions shown; findings below may reference images not displayed]



The usual time-out protocol was performed immediately prior to the
procedure.

Using mammographic guidance, sterile technique, 1% lidocaine and an
D-46X radioactive seed, the known cancer was localized using a
superior approach. The follow-up mammogram images confirm the seed
in the expected location and were marked for the surgeon.

Follow-up survey of the patient confirms presence of the radioactive
seed.

Order number of [id number] seed:  [id number].

Total activity:  0.252 millicuries reference Date: August 28, 2017

Patient presents for radioactive seed localization prior to surgery.
I met with the patient and we discussed the procedure of seed
localization including benefits and alternatives. We discussed the
high likelihood of a successful procedure. We discussed the risks of
the procedure including infection, bleeding, tissue injury and
further surgery. We discussed the low dose of radioactivity involved
in the procedure. Informed, written consent was given.

The usual time-out protocol was performed immediately prior to the
procedure.

Using mammographic guidance, sterile technique, 1% lidocaine and an
D-46X radioactive seed, the X shaped clip was localized using a
superior approach. The follow-up mammogram images confirm the seed
in the expected location and were marked for the surgeon.

Follow-up survey of the patient confirms presence of the radioactive
seed.

Order number of [id number] seed:  [id number].

Total activity:  0.258 millicuries reference Date: July 30, 2017

The patient tolerated the procedure well and was released from the
[REDACTED]. She was given instructions regarding seed removal.
IMPRESSION: Radioactive seed localization left breast. No apparent
complications.

## 2019-08-29 DIAGNOSIS — M542 Cervicalgia: Secondary | ICD-10-CM | POA: Diagnosis not present

## 2019-08-29 DIAGNOSIS — S62109A Fracture of unspecified carpal bone, unspecified wrist, initial encounter for closed fracture: Secondary | ICD-10-CM | POA: Diagnosis not present

## 2019-08-29 DIAGNOSIS — I10 Essential (primary) hypertension: Secondary | ICD-10-CM | POA: Diagnosis not present

## 2019-08-29 DIAGNOSIS — F411 Generalized anxiety disorder: Secondary | ICD-10-CM | POA: Diagnosis not present

## 2019-08-29 DIAGNOSIS — M8580 Other specified disorders of bone density and structure, unspecified site: Secondary | ICD-10-CM | POA: Diagnosis not present

## 2019-08-29 DIAGNOSIS — Z6835 Body mass index (BMI) 35.0-35.9, adult: Secondary | ICD-10-CM | POA: Diagnosis not present

## 2019-08-29 DIAGNOSIS — F331 Major depressive disorder, recurrent, moderate: Secondary | ICD-10-CM | POA: Diagnosis not present

## 2019-08-29 DIAGNOSIS — D0500 Lobular carcinoma in situ of unspecified breast: Secondary | ICD-10-CM | POA: Diagnosis not present

## 2019-08-29 IMAGING — MG BREAST SURGICAL SPECIMEN
2 series · 2 of 2 positions shown · non-contrast
Comparison: Previous exam(s).

CLINICAL DATA: Biopsy proven benign breast tissue in the
upper-outer quadrant of the left breast felt to be discordant.
Excision recommended.

EXAM:
SPECIMEN RADIOGRAPH OF THE LEFT BREAST

[L (1 of 2)]
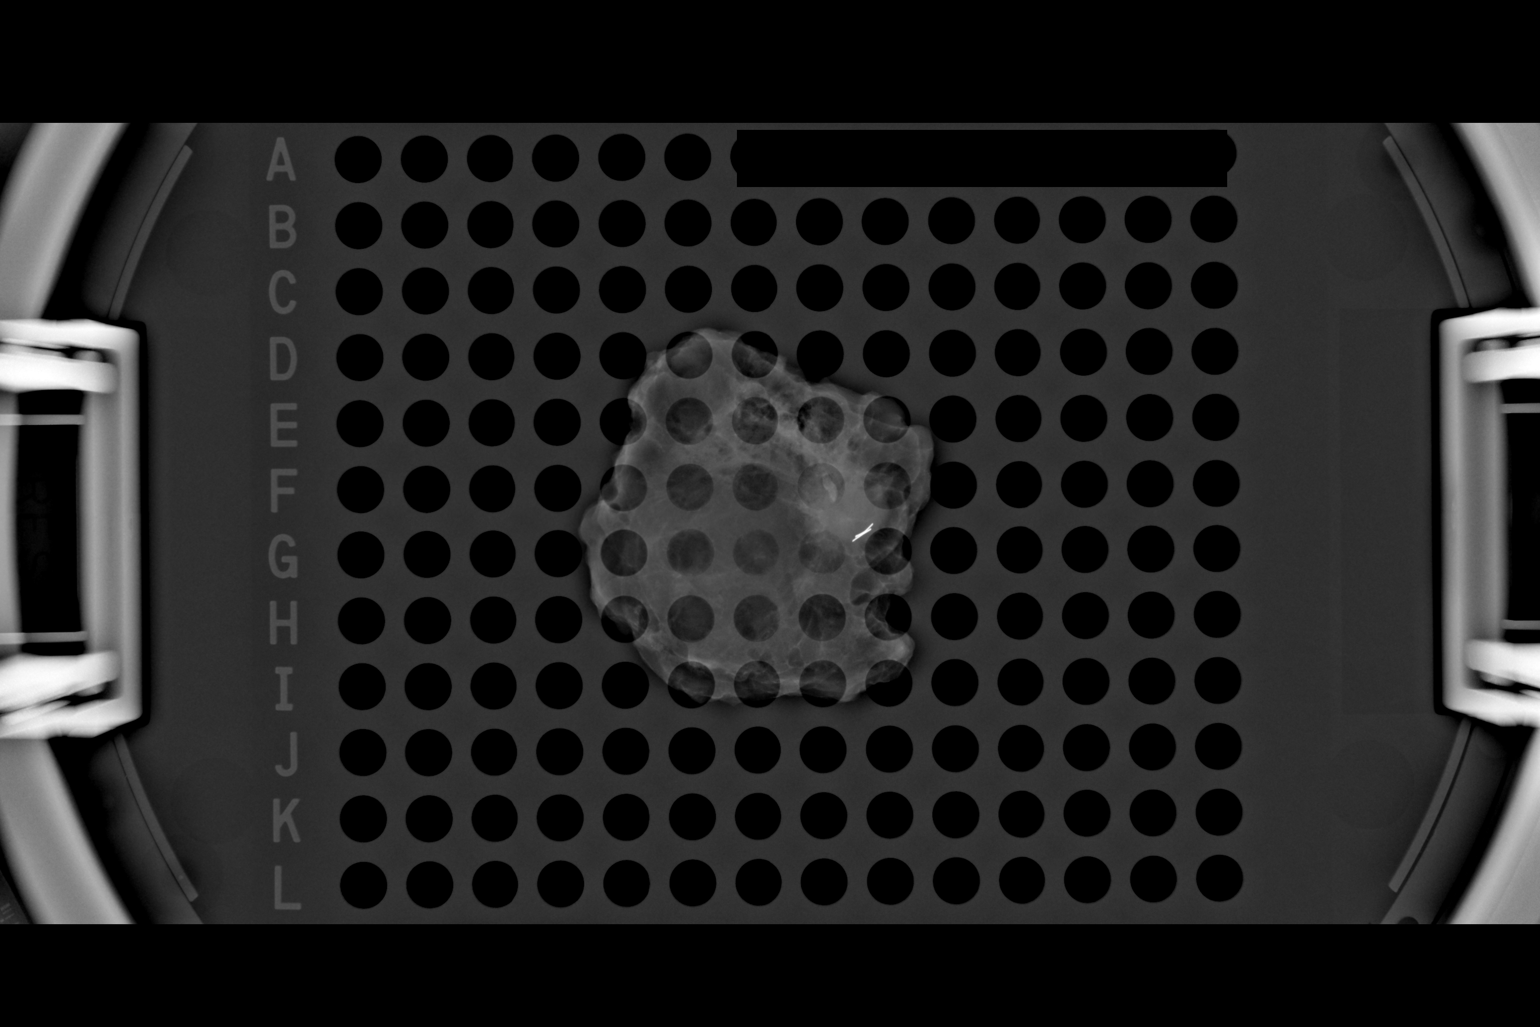

[L (2 of 2)]
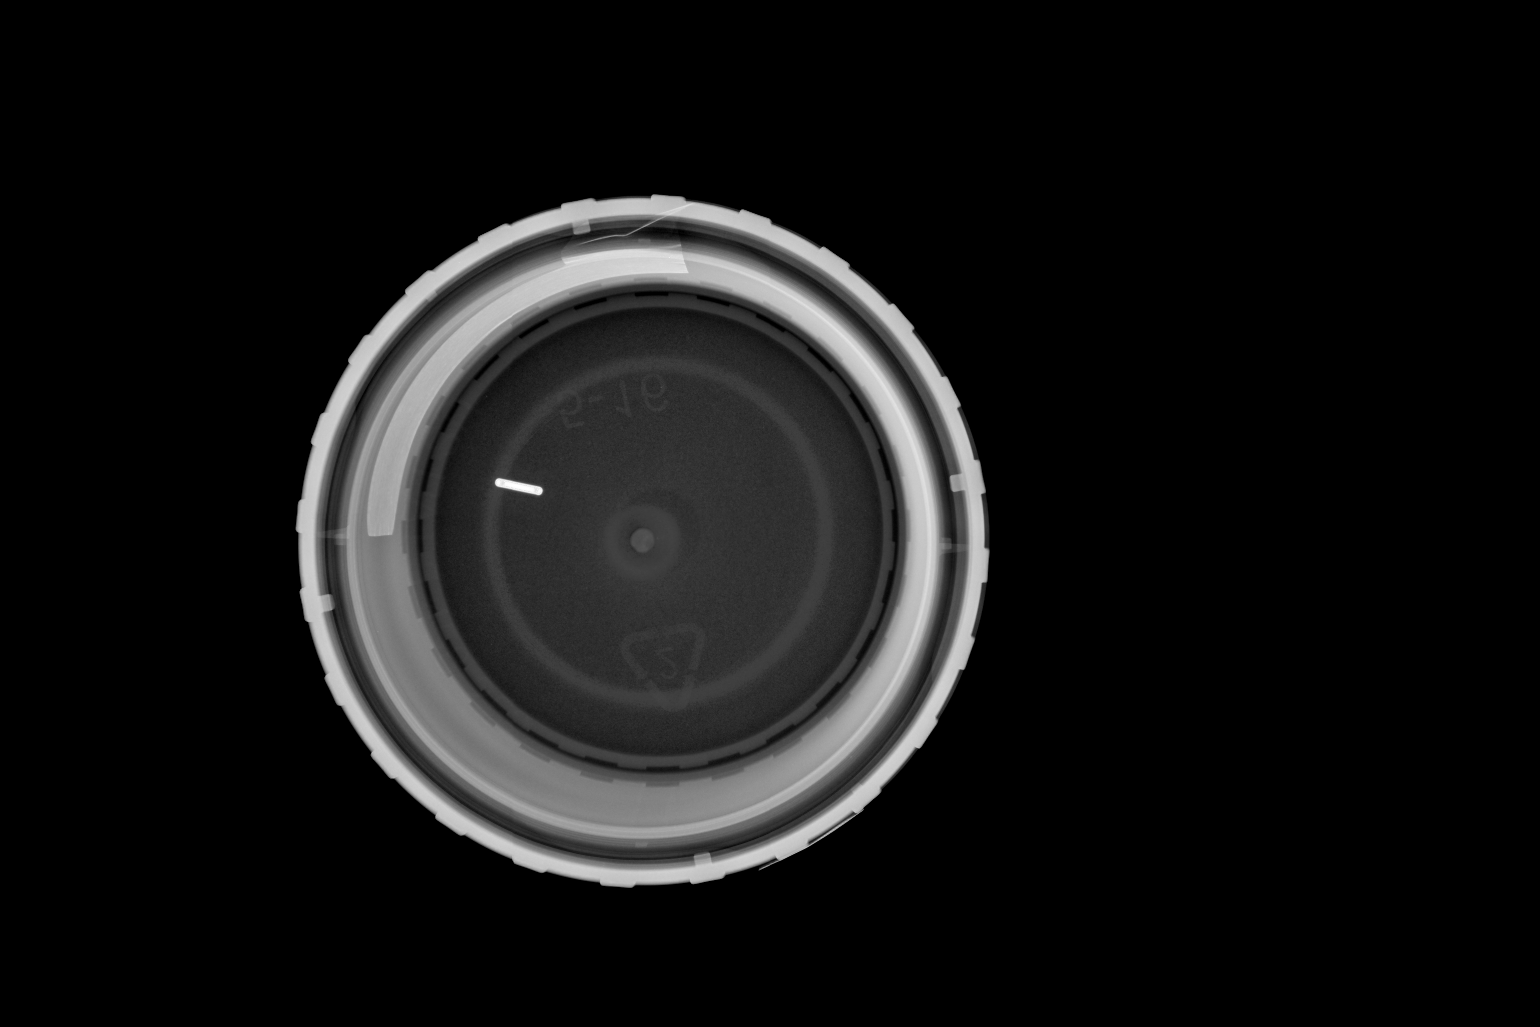

[2 of 2 positions shown; findings below may reference images not displayed]

FINDINGS: Status post excision of the left breast. The radioactive seed and
biopsy marker clip are present, completely intact, and were marked
for pathology.
IMPRESSION: Specimen radiograph of the left breast.

## 2019-08-29 IMAGING — MG BREAST SURGICAL SPECIMEN
1 series · 1 of 1 positions shown · non-contrast
Comparison: Previous exam(s).

CLINICAL DATA: Biopsy proven grade 2 invasive mammary carcinoma,
mammary carcinoma in-situ with calcifications and a complex
sclerosing lesion in the upper outer quadrant the left breast.

EXAM:
SPECIMEN RADIOGRAPH OF THE LEFT BREAST

[L]
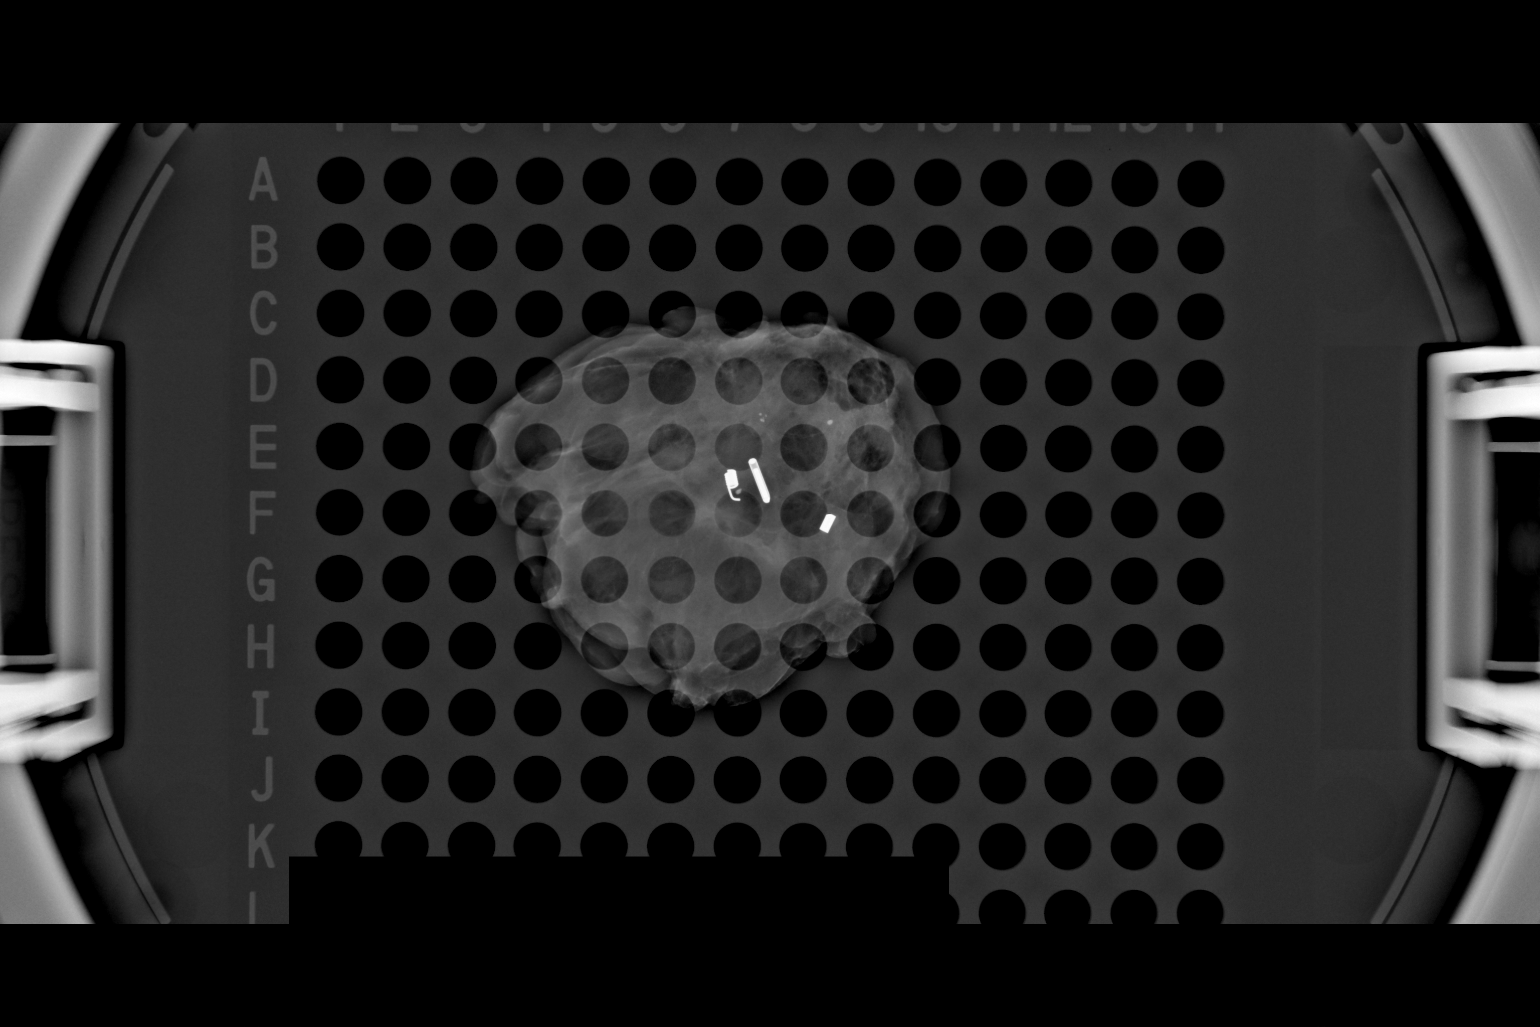

[1 of 1 positions shown; findings below may reference images not displayed]

FINDINGS: Status post excision of the left breast. The radioactive seed and
biopsy marker clip are present, completely intact, and were marked
for pathology.
IMPRESSION: Specimen radiograph of the left breast.

## 2019-08-30 DIAGNOSIS — M542 Cervicalgia: Secondary | ICD-10-CM | POA: Diagnosis not present

## 2019-09-06 DIAGNOSIS — M25631 Stiffness of right wrist, not elsewhere classified: Secondary | ICD-10-CM | POA: Diagnosis not present

## 2019-09-06 DIAGNOSIS — S6291XA Unspecified fracture of right wrist and hand, initial encounter for closed fracture: Secondary | ICD-10-CM | POA: Diagnosis not present

## 2019-09-14 DIAGNOSIS — M5030 Other cervical disc degeneration, unspecified cervical region: Secondary | ICD-10-CM | POA: Diagnosis not present

## 2019-09-14 DIAGNOSIS — M25512 Pain in left shoulder: Secondary | ICD-10-CM | POA: Diagnosis not present

## 2019-09-26 DIAGNOSIS — M25631 Stiffness of right wrist, not elsewhere classified: Secondary | ICD-10-CM | POA: Diagnosis not present

## 2019-09-29 DIAGNOSIS — M25512 Pain in left shoulder: Secondary | ICD-10-CM | POA: Diagnosis not present

## 2019-09-29 DIAGNOSIS — M542 Cervicalgia: Secondary | ICD-10-CM | POA: Diagnosis not present

## 2019-10-03 DIAGNOSIS — M25631 Stiffness of right wrist, not elsewhere classified: Secondary | ICD-10-CM | POA: Diagnosis not present

## 2019-10-05 DIAGNOSIS — Z4789 Encounter for other orthopedic aftercare: Secondary | ICD-10-CM | POA: Diagnosis not present

## 2019-10-05 DIAGNOSIS — S6291XA Unspecified fracture of right wrist and hand, initial encounter for closed fracture: Secondary | ICD-10-CM | POA: Diagnosis not present

## 2019-10-06 DIAGNOSIS — M542 Cervicalgia: Secondary | ICD-10-CM | POA: Diagnosis not present

## 2019-10-06 DIAGNOSIS — M25512 Pain in left shoulder: Secondary | ICD-10-CM | POA: Diagnosis not present

## 2019-10-10 DIAGNOSIS — M25631 Stiffness of right wrist, not elsewhere classified: Secondary | ICD-10-CM | POA: Diagnosis not present

## 2019-10-13 DIAGNOSIS — M25512 Pain in left shoulder: Secondary | ICD-10-CM | POA: Diagnosis not present

## 2019-10-13 DIAGNOSIS — M542 Cervicalgia: Secondary | ICD-10-CM | POA: Diagnosis not present

## 2019-10-19 DIAGNOSIS — M542 Cervicalgia: Secondary | ICD-10-CM | POA: Diagnosis not present

## 2019-10-19 DIAGNOSIS — M25512 Pain in left shoulder: Secondary | ICD-10-CM | POA: Diagnosis not present

## 2019-10-24 DIAGNOSIS — M25631 Stiffness of right wrist, not elsewhere classified: Secondary | ICD-10-CM | POA: Diagnosis not present

## 2019-10-26 DIAGNOSIS — M25512 Pain in left shoulder: Secondary | ICD-10-CM | POA: Diagnosis not present

## 2019-10-26 DIAGNOSIS — M542 Cervicalgia: Secondary | ICD-10-CM | POA: Diagnosis not present

## 2019-11-01 DIAGNOSIS — Z807 Family history of other malignant neoplasms of lymphoid, hematopoietic and related tissues: Secondary | ICD-10-CM | POA: Diagnosis not present

## 2019-11-01 DIAGNOSIS — Z1379 Encounter for other screening for genetic and chromosomal anomalies: Secondary | ICD-10-CM | POA: Diagnosis not present

## 2019-11-01 DIAGNOSIS — Z803 Family history of malignant neoplasm of breast: Secondary | ICD-10-CM | POA: Diagnosis not present

## 2019-11-01 DIAGNOSIS — Z853 Personal history of malignant neoplasm of breast: Secondary | ICD-10-CM | POA: Diagnosis not present

## 2019-11-16 DIAGNOSIS — S52571D Other intraarticular fracture of lower end of right radius, subsequent encounter for closed fracture with routine healing: Secondary | ICD-10-CM | POA: Diagnosis not present

## 2019-12-01 ENCOUNTER — Telehealth: Payer: Self-pay | Admitting: *Deleted

## 2019-12-01 NOTE — Telephone Encounter (Addendum)
Called patient and LVM reminding her of scheduled appointments for 12/02/19. Reminded patient that she could have breakfast  before 9:00am, bring a snack to eat after labs and to wear comfortable clothes and walking shoes for the UpBeat assessments.

## 2019-12-01 NOTE — Progress Notes (Signed)
12/02/2019 10:51 AM   Sylvia Barnett 11-Jan-1946 [id number]  Referring provider: Manon Hilding, MD [place],  [place] [id number]  Recurrent UTI and urinary urgency  HPI: Sylvia Barnett is a 74yo here for followup for urinary urgency and recurrent. Last visit she was stated on keflex 250 prophylaxis but she had a breakthrough about 2 months ago and was treated with amoxicillin for a dental issue and that cleared the voiding symptoms.  No dysuria or hematuria. She has urgency and occasional UUI.  She got Myrbetriq samples and they helped but it was too expensive.  She has failed detrol and vesicare in the past.  She had PTNS for 2 years but that didn't work as well as the [employer].  She had urinary frequency every 2-2.5 hours. She has nocturia x 2.  She has had some right back pain and she thinks that is from a UTI but her UA is clear today.   PMH: Past Medical History:  Diagnosis Date  . Anxiety    Since in her 20's  . Arthritis 2013   Right hip  . Cancer [facility]) 2004    Breast Lumpectomy  . Complication of anesthesia    Has a small throat  . Depression    Started when she was in her 21's  . Hypercholesteremia 2012  . Hypertension 2013  . IBS (irritable bowel syndrome) 2018   per GI note  . Incontinence-urinary 2012  . Lobular carcinoma in situ (LCIS) of left breast 09/08/2008  . Melanoma of back ([place]) 1994   Excision with clear margins  . Microscopic colitis 2015   Resolved 2016    Surgical History: Past Surgical History:  Procedure Laterality Date  . ABDOMINAL HYSTERECTOMY  2002  . APPENDECTOMY  1949  . BIOPSY  02/26/2018   Procedure: BIOPSY;  Surgeon: Rogene Houston, MD;  [facility];  Service: Endoscopy;;  gastric  . BIOPSY BREAST  2010   Left  . Bladder Tack  2002  . BREAST LUMPECTOMY  2004   Left  . BREAST LUMPECTOMY  2004   Left  . BREAST LUMPECTOMY  2006   Left  . BREAST LUMPECTOMY  2000   Left  . BREAST LUMPECTOMY WITH RADIOACTIVE  SEED AND SENTINEL LYMPH NODE BIOPSY Left 09/11/2017   Procedure: LEFT BREAST LUMPECTOMY X2 WITH RADIOACTIVE SEED AND LEFT AXILLARY SENTINEL LYMPH NODE BIOPSY;  Surgeon: Excell Seltzer, MD;  Location: [facility];  Service: General;  Laterality: Left;  . BUNIONECTOMY  1994   Right  . CATARACT EXTRACTION W/PHACO Left 08/18/2013   Procedure: CATARACT EXTRACTION PHACO AND INTRAOCULAR LENS PLACEMENT (IOC);  Surgeon: Tonny Branch, MD;  Location: [facility];  Service: Ophthalmology;  Laterality: Left;  CDE: 8.62  . COLONOSCOPY N/A 11/10/2013   Procedure: COLONOSCOPY;  Surgeon: Rogene Houston, MD;  [facility];  Service: Endoscopy;  Laterality: N/A;  240  . COLONOSCOPY N/A 12/30/2018   Procedure: COLONOSCOPY;  Surgeon: Rogene Houston, MD;  [facility];  Service: Endoscopy;  Laterality: N/A;  12  . ESOPHAGOGASTRODUODENOSCOPY N/A 02/26/2018   Procedure: ESOPHAGOGASTRODUODENOSCOPY (EGD);  Surgeon: Rogene Houston, MD;  [facility];  Service: Endoscopy;  Laterality: N/A;  1055  . EYE SURGERY  2011   Catarct Right  . EYE SURGERY  08-2011   Right Yag procedure  . JOINT REPLACEMENT  08-2010   Left total knee  . KNEE ARTHROSCOPY  2010   Right  .  KNEE ARTHROSCOPY  2003   Left  . POLYPECTOMY  12/30/2018   Procedure: POLYPECTOMY;  Surgeon: Rogene Houston, MD;  [facility];  Service: Endoscopy;;  hepatic flexure,transverse colon    . PORTACATH PLACEMENT N/A 09/11/2017   Procedure: INSERTION PORT-A-CATH;  Surgeon: Excell Seltzer, MD;  Location: [place];  Service: General;  Laterality: N/A;  . TOTAL KNEE ARTHROPLASTY  11/03/2011   Procedure: TOTAL KNEE ARTHROPLASTY;  Surgeon: Gearlean Alf, MD;  Location: [facility];  Service: Orthopedics;  Laterality: Right;  . TUBAL LIGATION    . WRIST SURGERY Bilateral 03-2010  . WRIST SURGERY  2008   Right    Home Medications:  Allergies as of 12/02/2019      Reactions   Fiorinal-codeine #3 [butalbital-asa-caff-codeine] Rash    Lansoprazole Swelling   Latex Swelling, Other (See Comments), Itching, Rash   blisters   Butalbital-aspirin-caffeine Rash   Sulfa Antibiotics Rash   Femara [letrozole] Rash   Rash when tried in 1984   Sulfasalazine Rash      Medication List       Accurate as of December 02, 2019 10:51 AM. If you have any questions, ask your nurse or doctor.        atorvastatin 10 MG tablet Commonly known as: LIPITOR Take 10 mg by mouth at bedtime.   bismuth subsalicylate 268 TM/19QQ suspension Commonly known as: PEPTO BISMOL Take 30 mLs by mouth every 6 (six) hours as needed for indigestion.   calcium-vitamin D 500-200 MG-UNIT Tabs tablet Commonly known as: OSCAL WITH D Take by mouth.   cephALEXin 250 MG capsule Commonly known as: KEFLEX Take 1 capsule (250 mg total) by mouth at bedtime.   escitalopram 5 MG tablet Commonly known as: LEXAPRO Take 5 mg by mouth at bedtime.   hydrochlorothiazide 12.5 MG capsule Commonly known as: MICROZIDE Take 12.5 mg by mouth daily.   hydroxypropyl methylcellulose / hypromellose 2.5 % ophthalmic solution Commonly known as: ISOPTO TEARS / GONIOVISC Place 1 drop into both eyes 3 (three) times daily as needed for dry eyes.   ibuprofen 800 MG tablet Commonly known as: ADVIL Take 1 tablet (800 mg total) by mouth every 8 (eight) hours as needed.   Icy Hot 5 % Pads Generic drug: Menthol (Topical Analgesic) Apply 1 patch topically daily as needed (pain).   ketoconazole 2 % cream Commonly known as: NIZORAL   loperamide 2 MG capsule Commonly known as: IMODIUM Take 2 mg by mouth as needed for diarrhea or loose stools.   mirabegron ER 25 MG Tb24 tablet Commonly known as: MYRBETRIQ Take 1 tablet (25 mg total) by mouth daily. Started by: Irine Seal, MD       Allergies:  Allergies  Allergen Reactions  . Fiorinal-Codeine #3 [Butalbital-Asa-Caff-Codeine] Rash  . Lansoprazole Swelling  . Latex Swelling, Other (See Comments), Itching and Rash      blisters  . Butalbital-Aspirin-Caffeine Rash  . Sulfa Antibiotics Rash  . Femara [Letrozole] Rash    Rash when tried in 1984  . Sulfasalazine Rash    Family History: Family History  Problem Relation Age of Onset  . Breast cancer Sister 1  . Lymphoma Maternal Aunt     Social History:  reports that she has never smoked. She has never used smokeless tobacco. She reports that she does not drink alcohol and does not use drugs.  ROS: All other review of systems were reviewed and are negative except what is noted above in HPI  Physical Exam:  BP (!) 143/85   Pulse 73   Temp 98.4 F (36.9 C)   Ht 5\' 3"  (1.6 m)   Wt 215 lb (97.5 kg)   BMI 38.09 kg/m     Laboratory Data: Recent Results (from the past 2160 hour(s))  Urinalysis, Routine w reflex microscopic     Status: Abnormal   Collection Time: 12/02/19 10:14 AM  Result Value Ref Range   Specific Gravity, UA 1.025 1.005 - 1.030   pH, UA 5.0 5.0 - 7.5   Color, UA Yellow Yellow   Appearance Ur Clear Clear   Leukocytes,UA 1+ (A) Negative   Protein,UA Trace (A) Negative/Trace   Glucose, UA Negative Negative   Ketones, UA Negative Negative   RBC, UA Negative Negative   Bilirubin, UA Negative Negative   Urobilinogen, Ur 0.2 0.2 - 1.0 mg/dL   Nitrite, UA Negative Negative   Microscopic Examination Comment     Comment: Microscopic follows if indicated.     Pertinent Imaging:  No results found for this or any previous visit. No results found for this or any previous visit. No results found for this or any previous visit. No results found for this or any previous visit. Results for orders placed during the hospital encounter of 12/14/17  US RENAL   Narrative CLINICAL DATA:  Possible cyst in the right ovary  EXAM: RENAL / URINARY TRACT ULTRASOUND COMPLETE  COMPARISON:  Limited views of the kidneys from a cardiac research MRI dated October 15, 2017.  FINDINGS: Right Kidney:  Renal measurements: 10.7 x 5.8 x 5 cm =  volume: 162 mL. The renal cortical echotexture remains lower than that of the liver. There is moderate hydronephrosis.  Left Kidney:  Renal measurements: 12.3 x 5.9 x 5.4 cm = volume: 204 mL. There is no left-sided hydronephrosis. There is a large upper pole cyst measuring 7 x 6.1 x 7.3 cm. No internal echoes are observed. The cortical echotexture is similar to that on the right.  Bladder:  The partially distended urinary bladder is normal. There is a moderate-sized postvoid residual volume. No ureteral jets were observed.  IMPRESSION: Moderate right-sided hydronephrosis.  No left-sided hydronephrosis.  Large simple appearing exophytic left upper pole cyst measuring 7.3 cm in greatest dimension.  Moderate-sized postvoid residual urinary bladder volume.   Electronically Signed   By: David  Martinique M.D.   On: 12/15/2017 11:36    No results found for this or any previous visit. No results found for this or any previous visit. No results found for this or any previous visit.  Assessment & Plan:    1. Urinary tract infection without hematuria, site unspecified -continue keflex 250mg  qhs - POCT urinalysis dipstick  2. OAB (overactive bladder) -Mirabegron 25mg  daily helped and she took it every other day.  I have given her additional samples.    Return in about 6 months (around 05/31/2020).  Irine Seal, MD  [facility] [place]

## 2019-12-02 ENCOUNTER — Inpatient Hospital Stay: Payer: Medicare Other | Admitting: Radiology

## 2019-12-02 ENCOUNTER — Other Ambulatory Visit: Payer: Self-pay

## 2019-12-02 ENCOUNTER — Ambulatory Visit (HOSPITAL_COMMUNITY): Admission: RE | Admit: 2019-12-02 | Payer: Medicare Other | Source: Ambulatory Visit

## 2019-12-02 ENCOUNTER — Encounter: Payer: Self-pay | Admitting: Urology

## 2019-12-02 ENCOUNTER — Ambulatory Visit (INDEPENDENT_AMBULATORY_CARE_PROVIDER_SITE_OTHER): Payer: Medicare Other | Admitting: Urology

## 2019-12-02 ENCOUNTER — Inpatient Hospital Stay: Payer: Medicare Other | Attending: Hematology and Oncology

## 2019-12-02 VITALS — BP 143/85 | HR 73 | Temp 98.4°F | Ht 63.0 in | Wt 215.0 lb

## 2019-12-02 VITALS — BP 154/78 | HR 72 | Ht 64.0 in | Wt 212.1 lb

## 2019-12-02 DIAGNOSIS — N3020 Other chronic cystitis without hematuria: Secondary | ICD-10-CM | POA: Diagnosis not present

## 2019-12-02 DIAGNOSIS — N3941 Urge incontinence: Secondary | ICD-10-CM | POA: Diagnosis not present

## 2019-12-02 DIAGNOSIS — Z170 Estrogen receptor positive status [ER+]: Secondary | ICD-10-CM

## 2019-12-02 DIAGNOSIS — C50412 Malignant neoplasm of upper-outer quadrant of left female breast: Secondary | ICD-10-CM | POA: Insufficient documentation

## 2019-12-02 DIAGNOSIS — R350 Frequency of micturition: Secondary | ICD-10-CM | POA: Diagnosis not present

## 2019-12-02 DIAGNOSIS — N390 Urinary tract infection, site not specified: Secondary | ICD-10-CM

## 2019-12-02 LAB — URINALYSIS, ROUTINE W REFLEX MICROSCOPIC
Bilirubin, UA: NEGATIVE
Glucose, UA: NEGATIVE
Ketones, UA: NEGATIVE
Nitrite, UA: NEGATIVE
RBC, UA: NEGATIVE
Specific Gravity, UA: 1.025 (ref 1.005–1.030)
Urobilinogen, Ur: 0.2 mg/dL (ref 0.2–1.0)
pH, UA: 5 (ref 5.0–7.5)

## 2019-12-02 LAB — RESEARCH LABS

## 2019-12-02 MED ORDER — MIRABEGRON ER 25 MG PO TB24: 25.0000 mg | ORAL_TABLET | Freq: Every day | ORAL | 0 refills | Status: DC

## 2019-12-02 NOTE — Research (Addendum)
[id number] Month 24  12/02/19 [name] presented alone today for her 24 month UPBEAT study visit.  Addendum Consent: [name] consented to receive questionnaires via e-mail and to receive the social determinants of health questionnaire.  Labs -  The patient had her research labs drawn by phlebotomy.  Cardiac MRI -   Unfortunately, due to timing, she was unable to have the MRI performed today.  Her MRI was rescheduled to 12/15/2019 at 11:00am.  She was informed of this change.  Both the patient and the MRI department were instructed to inform the clinical research department of any changes to this appointment.  Vitals - Vital signs including height, weight, blood pressure and pulse were obtained.  Vital signs were measured after five minutes of rest in a seated position and heart rate and blood pressure were repeated after one minute.    Questionnaires - Patient was provided with study required questionnaires including the social determinants of health questionnaire.  This Research officer, political party reviewed for completeness.    Physical Function Testing - The physical functions testing was completed during this visit.  The patient's waist was also measured, and measured at 46".  Neurocognitive Testing - Neurocognitive tests were performed today this clinical research coordinator.  Concomitant Medications - The patient reviewed her medication list and confirmed that it is correct.  The patient was thanked for her ongoing participation in the UPBEAT study.     Clabe Seal Clinical Research Coordinator 12/02/19 5:01 PM

## 2019-12-02 NOTE — Progress Notes (Signed)
Urological Symptom Review  Patient is experiencing the following symptoms: Frequent urination Hard to postpone urination Get up at night to urinate   Review of Systems  Gastrointestinal (upper)  : Negative for upper GI symptoms  Gastrointestinal (lower) : Diarrhea  Constitutional : Negative for symptoms  Skin: Negative for skin symptoms  Eyes: Negative for eye symptoms  Ear/Nose/Throat : Negative for Ear/Nose/Throat symptoms  Hematologic/Lymphatic: Negative for Hematologic/Lymphatic symptoms  Cardiovascular : Leg swelling  Respiratory : Negative for respiratory symptoms  Endocrine: Excessive thirst  Musculoskeletal: Back pain  Neurological: Negative for neurological symptoms  Psychologic: Negative for psychiatric symptoms

## 2019-12-05 ENCOUNTER — Telehealth: Payer: Self-pay | Admitting: Emergency Medicine

## 2019-12-05 NOTE — Telephone Encounter (Signed)
Called to notify patient of her research lab results.  She did not answer.  This research coordinator left a voicemail instructing her to return the call.  Clabe Seal Clinical Research Coordinator 12/05/19  5:00 PM

## 2019-12-07 NOTE — Telephone Encounter (Signed)
12/07/19  3:51pm: Called and left voicemail.  3:54pm: The patient returned my call.  She requested for her test results to be mailed to her.  She is aware of her cardiac MRI appointment scheduled for 12/15/19 at 11:00am.  Clabe Seal Clinical Research Coordinator I 12/07/19  3:59 PM

## 2019-12-15 ENCOUNTER — Ambulatory Visit (HOSPITAL_COMMUNITY)
Admission: RE | Admit: 2019-12-15 | Discharge: 2019-12-15 | Disposition: A | Discharge status: Home / Self Care | Payer: Self-pay | Source: Ambulatory Visit | Attending: Hematology and Oncology | Admitting: Hematology and Oncology

## 2019-12-15 ENCOUNTER — Other Ambulatory Visit: Payer: Self-pay

## 2019-12-15 ENCOUNTER — Encounter: Payer: Self-pay | Admitting: Emergency Medicine

## 2019-12-15 DIAGNOSIS — C50412 Malignant neoplasm of upper-outer quadrant of left female breast: Secondary | ICD-10-CM

## 2019-12-15 DIAGNOSIS — Z170 Estrogen receptor positive status [ER+]: Secondary | ICD-10-CM

## 2019-12-15 DIAGNOSIS — Z006 Encounter for examination for normal comparison and control in clinical research program: Secondary | ICD-10-CM | POA: Insufficient documentation

## 2019-12-15 NOTE — Research (Signed)
[id number]  Understanding and Predicting Breast Cancer Events after Treatment (UPBEAT)  Month 24 Cardiac MRI visit   12/15/19 Sylvia [facility] presented to the [facility] Radiology department alone today for her cardiac MRI appointment.  Cardiac MRI -  A cardiac MRI was performed today at the [facility] Radiology department according to the UPBEAT protocol for her 24 month assessment.  This completes the requirements for her 24 month UPBEAT visit as other assessments and questionnaires were completed on 12/02/19.  Gift Card - Sylvia [facility] was given a $25 [employer] Gift card today for her participation in the study.  The patient was thanked for her ongoing participation in the UPBEAT study.    Clabe Seal Clinical Research Coordinator 12/15/19 10:54 AM

## 2019-12-23 ENCOUNTER — Telehealth: Payer: Self-pay | Admitting: Emergency Medicine

## 2019-12-23 NOTE — Telephone Encounter (Signed)
[id number]  Understanding and Predicting Breast Cancer Events after Treatment (UPBEAT)  12/23/19   1:15pm:  Called to inform patient of her MRI results.  Patient did not answer.  Left voicemail instructing her to call back.   Clabe Seal Clinical Research Coordinator I 12/23/19  1:16 PM

## 2019-12-23 NOTE — Telephone Encounter (Signed)
[id number] and Predicting Breast Cancer Events after Treatment (UPBEAT)  12/23/19   2:45pm: Patient returned my phone call.  Informed patient of results of Cardiac MRI performed on 12/15/2019 which showed: "Suspect increased LV mass.  Consider transthoracic echocardiogram to confirm.  Cannot exclude hypertrophic cardiomyopathy."  Informed her that Dr. Lindi Adie recommended consult to Dr. Haroldine Laws in Cardiology  Patient informed me that she had an echocardiogram in April 2021 performed at [facility] in [place], [place].  She states the results of this were normal.  Clabe Seal Clinical Research Coordinator I  12/23/19 2:49 PM

## 2019-12-28 ENCOUNTER — Other Ambulatory Visit: Payer: Self-pay

## 2019-12-28 DIAGNOSIS — R931 Abnormal findings on diagnostic imaging of heart and coronary circulation: Secondary | ICD-10-CM

## 2019-12-28 NOTE — Progress Notes (Signed)
If patient is being referred sue to an abnormal research study, we will need to see the research study imaging to further evaluate. thanks

## 2019-12-28 NOTE — Progress Notes (Signed)
MD recommendations for Cardiovascular referral to Dr. Haroldine Laws due to suspected increased LV mass findings during research study.    RN placed referral.

## 2019-12-30 NOTE — Progress Notes (Signed)
Thank you :)

## 2019-12-30 NOTE — Progress Notes (Signed)
I will fax report over to your office today, and will have our medical records department scan into Epic as well.  Thanks!

## 2020-01-03 ENCOUNTER — Telehealth (HOSPITAL_COMMUNITY): Payer: Self-pay | Admitting: Cardiology

## 2020-01-03 DIAGNOSIS — Z5181 Encounter for therapeutic drug level monitoring: Secondary | ICD-10-CM

## 2020-01-03 DIAGNOSIS — Z170 Estrogen receptor positive status [ER+]: Secondary | ICD-10-CM

## 2020-01-03 NOTE — Telephone Encounter (Signed)
Pt called to f/u with referral sent in by Dr Lindi Adie

## 2020-01-09 NOTE — Telephone Encounter (Signed)
Per Dr Haroldine Laws pt needs echo and appt. Order placed, mess to schedulers to arrange

## 2020-01-30 NOTE — Progress Notes (Signed)
CARDIO-ONCOLOGY CLINIC CONSULT NOTE  Referring Physician: Lindi Adie Primary Care: Manon Hilding, MD   HPI:  Sylvia [facility] is 75 y.o. female with left breast cancer referred by Dr. Lindi Adie for enrollment into the Cardio-Oncology program due to elevated LV mass.   She has a h/o HTN, HL, melanoma. Diagnosed with L breast CA in 7/19. Cancer history as follows:  Oncology History  Malignant neoplasm of upper-outer quadrant of left breast in female, estrogen receptor positive ([place])  08/05/2017 Initial Diagnosis   Screening detected left breast calcifications UOQ 1.1 cm biopsy revealed invasive lobular cancer with LCIS and CSL, grade 2, ER 90%, PR 0%, Ki-67 1%, HER-2 positive ratio 2.62, T2N0 stage Ia clinical stage AJCC 8   08/12/2017 Cancer Staging   Staging form: Breast, AJCC 8th Edition - Clinical stage from 08/12/2017: Stage IA (cT1c, cN0, cM0, G2, ER+, PR-, HER2+) - Signed by Nicholas Lose, MD on 08/12/2017   09/11/2017 Surgery   Left lumpectomy: Grade 2 invasive lobular cancer, 1.1 cm, LCIS, margins negative, 0/2 lymph nodes negative, ER 90% strong staining, PR negative, HER-2 positive ratio 2.62, Ki-67 1%, T1c N0 stage Ia   09/23/2017 Cancer Staging   Staging form: Breast, AJCC 8th Edition - Pathologic: Stage IA (pT1c, pN0, cM0, G2, ER+, PR-, HER2+) - Signed by Gardenia Phlegm, NP on 09/23/2017   11/02/2017 -  Chemotherapy   Adjuvant therapy with Taxol Herceptin weekly x12 followed by Herceptin maintenance for 1 year   02/25/2018 - 03/23/2018 Radiation Therapy   Adjuvant Radiation in [place], [place] [facility]).  Left Breast 42.56 Gy   05/03/2018 -  Anti-estrogen oral therapy   Anastrozole, 38m daily (plan for 7 years), discontinued 02/24/19 due to joint aches and pains    Denies h/o known CAD. Had admission to [facility] in 2021. Myoview normal. Echo 7/20: EF 60-65% moderate LV basal hypertrophy. Grade1DD. GLS -24.2  On 12/15/19 Had cMRI as part of Ubeat trial and found to have  elevated LV mass so referred here. EF and wall motion were normal.   Here with her husband. Says her BP is typically well controlled SBP 125-135. Will go up if she is in pain. Denies current CP. Occasional SOB. No edema, orthopnea or PND. Not overly active but does ADLs and goes up 2 flights of steps without problem.  Snores very loudly. Very tired during the day.   ECHO today (01/31/20): EF 60-65% Grade 1DD mild to moderate basilar septal hypertrophy. LVIDd 4.7 LVPW 0.90cm LVSW 0.90 RV normal. Personally reviewed.   Review of Systems: [y] = yes, [ ]  = no   General: Weight gain [ ] ; Weight loss [ ] ; Anorexia [ ] ; Fatigue [ ] ; Fever [ ] ; Chills [ ] ; Weakness [ ]   Cardiac: Chest pain/pressure [ ] ; Resting SOB [ ] ; Exertional SOB [ y]; Orthopnea [ ] ; Pedal Edema [Blue.Reese]; Palpitations [ ] ; Syncope [ ] ; Presyncope [ ] ; Paroxysmal nocturnal dyspnea[ ]   Pulmonary: Cough [ ] ; Wheezing[ ] ; Hemoptysis[ ] ; Sputum [ ] ; Snoring [ y]  GI: Vomiting[ ] ; Dysphagia[ ] ; Melena[ ] ; Hematochezia [ ] ; Heartburn[ ] ; Abdominal pain [ ] ; Constipation [ ] ; Diarrhea [ ] ; BRBPR [ ]   GU: Hematuria[ ] ; Dysuria [ ] ; Nocturia[ ]   Vascular: Pain in legs with walking [ ] ; Pain in feet with lying flat [ ] ; Non-healing sores [ ] ; Stroke [ ] ; TIA [ ] ; Slurred speech [ ] ;  Neuro: Headaches[ ] ; Vertigo[ ] ; Seizures[ ] ; Paresthesias[ ] ;Blurred vision [ ] ; Diplopia [ ] ;  Vision changes [ ]   Ortho/Skin: Arthritis Blue.Reese ]; Joint pain Blue.Reese ]; Muscle pain [ ] ; Joint swelling [ ] ; Back Pain Blue.Reese ]; Rash [ ]   Psych: Depression[ ] ; Anxiety[ y]  Heme: Bleeding problems [ ] ; Clotting disorders [ ] ; Anemia [ ]   Endocrine: Diabetes [ ] ; Thyroid dysfunction[ ]    Past Medical History:  Diagnosis Date  . Anxiety    Since in her 20's  . Arthritis 2013   Right hip  . Cancer [facility]) 2004    Breast Lumpectomy  . Complication of anesthesia    Has a small throat  . Depression    Started when she was in her 61's  . Hypercholesteremia 2012  . Hypertension  2013  . IBS (irritable bowel syndrome) 2018   per GI note  . Incontinence-urinary 2012  . Lobular carcinoma in situ (LCIS) of left breast 09/08/2008  . Melanoma of back ([place]) 1994   Excision with clear margins  . Microscopic colitis 2015   Resolved 2016    Current Outpatient Medications  Medication Sig Dispense Refill  . atorvastatin (LIPITOR) 10 MG tablet Take 10 mg by mouth at bedtime.    . bismuth subsalicylate (PEPTO BISMOL) 262 MG/15ML suspension Take 30 mLs by mouth every 6 (six) hours as needed for indigestion.    . calcium-vitamin D (OSCAL WITH D) 500-200 MG-UNIT TABS tablet Take by mouth.    . cephALEXin (KEFLEX) 250 MG capsule Take 1 capsule (250 mg total) by mouth at bedtime. 90 capsule 3  . escitalopram (LEXAPRO) 5 MG tablet Take 5 mg by mouth at bedtime.     . hydrochlorothiazide (MICROZIDE) 12.5 MG capsule Take 12.5 mg by mouth daily.    . hydroxypropyl methylcellulose / hypromellose (ISOPTO TEARS / GONIOVISC) 2.5 % ophthalmic solution Place 1 drop into both eyes 3 (three) times daily as needed for dry eyes.    Marland Kitchen ibuprofen (ADVIL) 800 MG tablet Take 1 tablet (800 mg total) by mouth every 8 (eight) hours as needed. 30 tablet 0  . ketoconazole (NIZORAL) 2 % cream     . Loperamide HCl (IMODIUM PO) Take 2 mg by mouth daily.    . Menthol, Topical Analgesic, 5 % PADS Apply 1 patch topically daily as needed (pain).     No current facility-administered medications for this encounter.    Allergies  Allergen Reactions  . Fiorinal-Codeine #3 [Butalbital-Asa-Caff-Codeine] Rash  . Lansoprazole Swelling  . Latex Swelling, Other (See Comments), Itching and Rash    blisters  . Butalbital-Aspirin-Caffeine Rash  . Sulfa Antibiotics Rash  . Femara [Letrozole] Rash    Rash when tried in 1984  . Sulfasalazine Rash      Social History   Socioeconomic History  . Marital status: Married    Spouse name: Not on file  . Number of children: Not on file  . Years of education: Not on  file  . Highest education level: Not on file  Occupational History  . Not on file  Tobacco Use  . Smoking status: Never Smoker  . Smokeless tobacco: Never Used  Vaping Use  . Vaping Use: Not on file  Substance and Sexual Activity  . Alcohol use: No    Alcohol/week: 0.0 standard drinks  . Drug use: No  . Sexual activity: Yes    Birth control/protection: Surgical  Other Topics Concern  . Not on file  Social History Narrative  . Not on file   Social Determinants of Health   Financial Resource Strain:  Not on file  Food Insecurity: Not on file  Transportation Needs: Not on file  Physical Activity: Not on file  Stress: Not on file  Social Connections: Not on file  Intimate Partner Violence: Not on file      Family History  Problem Relation Age of Onset  . Breast cancer Sister 77  . Lymphoma Maternal Aunt     Vitals:   01/31/20 1048  BP: (!) 160/90  Pulse: 62  SpO2: 97%  Weight: 95.3 kg (210 lb)    PHYSICAL EXAM: General:  Well appearing. No respiratory difficulty HEENT: normal Neck: supple. no JVD. Carotids 2+ bilat; no bruits. No lymphadenopathy or thryomegaly appreciated. Cor: PMI nondisplaced. Regular rate & rhythm. No rubs, gallops or murmurs. Lungs: clear Abdomen: obese soft, nontender, nondistended. No hepatosplenomegaly. No bruits or masses. Good bowel sounds. Extremities: no cyanosis, clubbing, rash, edema Neuro: alert & oriented x 3, cranial nerves grossly intact. moves all 4 extremities w/o difficulty. Affect pleasant.  ECG: Sinus brady 59 with LVH. PAC with compensatory pause. Personally reviewed   ASSESSMENT & PLAN:  1. Left Breast Cancer - s/p left lumpectomy  - treated with adjuvant therapy with Taxol Herceptin weekly x12 followed by Herceptin maintenance for 1 year. Finished 10/20   2. Abnormal cardiac MRI - Echo 7/20: EF 60-65% moderate LV basal hypertrophy. Grade1DD. GLS -24.2 - cMRI as part of Ubeat trial 12/15/19 found to have "elevated  LV mass" - Echo today 1/18/22EF 60-65% Grade 1DD mild to moderate basilar septal hypertrophy. LVIDd 4.7 LVPW 0.90cm LVSW 0.90 RV normal. Personally reviewed. - Suspect she has early hypertensive heart disease with with mild thickening and Grade I DD. Stressed need for tight BP control (see below). Can increase diuretics as needed. Low threshold to add spironolactone for improved BP and volume control.   3. HTN  - BP high here but controlled at home by her report. (I suspect not well controlled) Urged her to follow her BP at home and keep SBP < 140 - Has home BP cuff. Will check 2x/week    4. Snoring and daytime fatigue - high suspicion for OSA - will check home sleep study  Glori Bickers, MD  11:24 AM

## 2020-01-31 ENCOUNTER — Ambulatory Visit (HOSPITAL_COMMUNITY)
Admission: RE | Admit: 2020-01-31 | Discharge: 2020-01-31 | Disposition: A | Discharge status: Home / Self Care | Payer: Medicare Other | Source: Ambulatory Visit | Attending: Internal Medicine | Admitting: Internal Medicine

## 2020-01-31 ENCOUNTER — Ambulatory Visit (HOSPITAL_BASED_OUTPATIENT_CLINIC_OR_DEPARTMENT_OTHER)
Admission: RE | Admit: 2020-01-31 | Discharge: 2020-01-31 | Disposition: A | Discharge status: Home / Self Care | Payer: Medicare Other | Source: Ambulatory Visit | Attending: Internal Medicine | Admitting: Internal Medicine

## 2020-01-31 ENCOUNTER — Other Ambulatory Visit: Payer: Self-pay

## 2020-01-31 ENCOUNTER — Encounter (HOSPITAL_COMMUNITY): Payer: Self-pay | Admitting: Internal Medicine

## 2020-01-31 VITALS — BP 160/90 | HR 62 | Wt 210.0 lb

## 2020-01-31 DIAGNOSIS — Z170 Estrogen receptor positive status [ER+]: Secondary | ICD-10-CM | POA: Insufficient documentation

## 2020-01-31 DIAGNOSIS — R0683 Snoring: Secondary | ICD-10-CM | POA: Diagnosis not present

## 2020-01-31 DIAGNOSIS — E785 Hyperlipidemia, unspecified: Secondary | ICD-10-CM | POA: Diagnosis not present

## 2020-01-31 DIAGNOSIS — Z79899 Other long term (current) drug therapy: Secondary | ICD-10-CM | POA: Insufficient documentation

## 2020-01-31 DIAGNOSIS — I10 Essential (primary) hypertension: Secondary | ICD-10-CM | POA: Diagnosis not present

## 2020-01-31 DIAGNOSIS — Z0189 Encounter for other specified special examinations: Secondary | ICD-10-CM

## 2020-01-31 DIAGNOSIS — I517 Cardiomegaly: Secondary | ICD-10-CM

## 2020-01-31 DIAGNOSIS — Z01810 Encounter for preprocedural cardiovascular examination: Secondary | ICD-10-CM | POA: Diagnosis not present

## 2020-01-31 DIAGNOSIS — Z5181 Encounter for therapeutic drug level monitoring: Secondary | ICD-10-CM | POA: Insufficient documentation

## 2020-01-31 DIAGNOSIS — C50412 Malignant neoplasm of upper-outer quadrant of left female breast: Secondary | ICD-10-CM | POA: Diagnosis not present

## 2020-01-31 DIAGNOSIS — I119 Hypertensive heart disease without heart failure: Secondary | ICD-10-CM | POA: Insufficient documentation

## 2020-01-31 DIAGNOSIS — I340 Nonrheumatic mitral (valve) insufficiency: Secondary | ICD-10-CM | POA: Insufficient documentation

## 2020-01-31 LAB — ECHOCARDIOGRAM COMPLETE
Area-P 1/2: 2.82 cm2
Calc EF: 63.3 %
S' Lateral: 3.5 cm
Single Plane A2C EF: 64 %
Single Plane A4C EF: 62.1 %

## 2020-01-31 NOTE — Patient Instructions (Signed)
Your provider has recommended that you have a home sleep study.  We have provided you with the equipment in our office today. Please download the app and follow the instructions. YOUR PIN NUMBER IS: [id number]. Once you have completed the test you just dispose of the equipment, the information is automatically uploaded to [place] via blue-tooth technology. If your test is positive for sleep apnea and you need a home CPAP machine you will be contacted by Dr Theodosia Blender office [facility]) to set this up.  Follow up as needed

## 2020-01-31 NOTE — Progress Notes (Signed)
Height: 5'4"    Weight: 210 lbs BMI: 36.05  Today's Date: 01/31/2020  STOP BANG RISK ASSESSMENT S (snore) Have you been told that you snore?     YES   T (tired) Are you often tired, fatigued, or sleepy during the day?   YES  O (obstruction) Do you stop breathing, choke, or gasp during sleep? NO   P (pressure) Do you have or are you being treated for high blood pressure? YES   B (BMI) Is your body index greater than 35 kg/m? YES   A (age) Are you 77 years old or older? YES   N (neck) Do you have a neck circumference greater than 16 inches?      G (gender) Are you a female? NO   TOTAL STOP/BANG "YES" ANSWERS 5                                                                       For Office Use Only              Procedure Order Form    YES to 3+ Stop Bang questions OR two clinical symptoms - patient qualifies for WatchPAT (CPT 95800)      Clinical Notes: Will consult Sleep Specialist and refer for management of therapy due to patient increased risk of Sleep Apnea. Ordering a sleep study due to the following two clinical symptoms: Excessive daytime sleepiness G47.10 / Loud snoring R06.83

## 2020-01-31 NOTE — Progress Notes (Signed)
  Echocardiogram 2D Echocardiogram has been performed.  Sylvia Barnett 01/31/2020, 10:08 AM

## 2020-02-07 ENCOUNTER — Ambulatory Visit (INDEPENDENT_AMBULATORY_CARE_PROVIDER_SITE_OTHER): Payer: Medicare Other | Admitting: Internal Medicine

## 2020-02-08 DIAGNOSIS — R922 Inconclusive mammogram: Secondary | ICD-10-CM | POA: Diagnosis not present

## 2020-02-08 DIAGNOSIS — Z853 Personal history of malignant neoplasm of breast: Secondary | ICD-10-CM | POA: Diagnosis not present

## 2020-02-08 DIAGNOSIS — Z9889 Other specified postprocedural states: Secondary | ICD-10-CM | POA: Diagnosis not present

## 2020-02-08 DIAGNOSIS — D0502 Lobular carcinoma in situ of left breast: Secondary | ICD-10-CM | POA: Diagnosis not present

## 2020-02-11 DIAGNOSIS — I10 Essential (primary) hypertension: Secondary | ICD-10-CM | POA: Diagnosis not present

## 2020-02-11 DIAGNOSIS — F419 Anxiety disorder, unspecified: Secondary | ICD-10-CM | POA: Diagnosis not present

## 2020-02-11 DIAGNOSIS — E785 Hyperlipidemia, unspecified: Secondary | ICD-10-CM | POA: Diagnosis not present

## 2020-02-11 DIAGNOSIS — Z79899 Other long term (current) drug therapy: Secondary | ICD-10-CM | POA: Diagnosis not present

## 2020-02-16 DIAGNOSIS — M545 Low back pain, unspecified: Secondary | ICD-10-CM | POA: Diagnosis not present

## 2020-02-16 DIAGNOSIS — I10 Essential (primary) hypertension: Secondary | ICD-10-CM | POA: Diagnosis not present

## 2020-02-16 DIAGNOSIS — R300 Dysuria: Secondary | ICD-10-CM | POA: Diagnosis not present

## 2020-02-16 DIAGNOSIS — Z6834 Body mass index (BMI) 34.0-34.9, adult: Secondary | ICD-10-CM | POA: Diagnosis not present

## 2020-02-17 ENCOUNTER — Telehealth (HOSPITAL_COMMUNITY): Payer: Self-pay | Admitting: Internal Medicine

## 2020-02-17 NOTE — Telephone Encounter (Signed)
Patient wanted to speak with a Nurse to make sure the home sleep study was sent properly.

## 2020-02-21 DIAGNOSIS — I10 Essential (primary) hypertension: Secondary | ICD-10-CM | POA: Diagnosis not present

## 2020-02-21 DIAGNOSIS — R739 Hyperglycemia, unspecified: Secondary | ICD-10-CM | POA: Diagnosis not present

## 2020-02-21 DIAGNOSIS — E7801 Familial hypercholesterolemia: Secondary | ICD-10-CM | POA: Diagnosis not present

## 2020-02-21 DIAGNOSIS — E7800 Pure hypercholesterolemia, unspecified: Secondary | ICD-10-CM | POA: Diagnosis not present

## 2020-02-28 DIAGNOSIS — M8580 Other specified disorders of bone density and structure, unspecified site: Secondary | ICD-10-CM | POA: Diagnosis not present

## 2020-02-28 DIAGNOSIS — Z1389 Encounter for screening for other disorder: Secondary | ICD-10-CM | POA: Diagnosis not present

## 2020-02-28 DIAGNOSIS — D0500 Lobular carcinoma in situ of unspecified breast: Secondary | ICD-10-CM | POA: Diagnosis not present

## 2020-02-28 DIAGNOSIS — I10 Essential (primary) hypertension: Secondary | ICD-10-CM | POA: Diagnosis not present

## 2020-02-28 DIAGNOSIS — F411 Generalized anxiety disorder: Secondary | ICD-10-CM | POA: Diagnosis not present

## 2020-02-28 DIAGNOSIS — M545 Low back pain, unspecified: Secondary | ICD-10-CM | POA: Diagnosis not present

## 2020-02-28 DIAGNOSIS — F331 Major depressive disorder, recurrent, moderate: Secondary | ICD-10-CM | POA: Diagnosis not present

## 2020-02-28 DIAGNOSIS — Z1331 Encounter for screening for depression: Secondary | ICD-10-CM | POA: Diagnosis not present

## 2020-03-12 ENCOUNTER — Telehealth (HOSPITAL_COMMUNITY): Payer: Self-pay | Admitting: Surgery

## 2020-03-12 NOTE — Telephone Encounter (Signed)
I attempted to call patient in reference to picking up and completing another home sleep study.  I left a message for a return call.

## 2020-03-13 ENCOUNTER — Telehealth (HOSPITAL_COMMUNITY): Payer: Self-pay | Admitting: Surgery

## 2020-03-13 NOTE — Telephone Encounter (Signed)
I called Sylvia Barnett in reference to home sleep study and the need to repeat secondary to insufficient data recording during her first test.  I have requested that she call me back so that I can fully explain the need to repeat test.

## 2020-03-14 ENCOUNTER — Telehealth (HOSPITAL_COMMUNITY): Payer: Self-pay | Admitting: Surgery

## 2020-03-14 NOTE — Telephone Encounter (Signed)
I spoke with Sylvia Barnett regarding home sleep study and the need to repeat secondary to insufficient data in the first test.  She tells me that she does not want to repeat the test, that she slept all night with the device and is unsure what may have happened.  She also is awaiting a phone call back from [place].

## 2020-04-26 DIAGNOSIS — D8489 Other immunodeficiencies: Secondary | ICD-10-CM | POA: Diagnosis not present

## 2020-05-31 ENCOUNTER — Ambulatory Visit: Payer: Medicare Other | Admitting: Urology

## 2020-06-21 DIAGNOSIS — M7550 Bursitis of unspecified shoulder: Secondary | ICD-10-CM | POA: Diagnosis not present

## 2020-06-21 DIAGNOSIS — I10 Essential (primary) hypertension: Secondary | ICD-10-CM | POA: Diagnosis not present

## 2020-06-21 DIAGNOSIS — Z6834 Body mass index (BMI) 34.0-34.9, adult: Secondary | ICD-10-CM | POA: Diagnosis not present

## 2020-07-19 DIAGNOSIS — I10 Essential (primary) hypertension: Secondary | ICD-10-CM | POA: Diagnosis not present

## 2020-07-19 DIAGNOSIS — F331 Major depressive disorder, recurrent, moderate: Secondary | ICD-10-CM | POA: Diagnosis not present

## 2020-07-19 DIAGNOSIS — E7849 Other hyperlipidemia: Secondary | ICD-10-CM | POA: Diagnosis not present

## 2020-07-19 DIAGNOSIS — F419 Anxiety disorder, unspecified: Secondary | ICD-10-CM | POA: Diagnosis not present

## 2020-07-19 DIAGNOSIS — Z6832 Body mass index (BMI) 32.0-32.9, adult: Secondary | ICD-10-CM | POA: Diagnosis not present

## 2020-07-26 DIAGNOSIS — Z6832 Body mass index (BMI) 32.0-32.9, adult: Secondary | ICD-10-CM | POA: Diagnosis not present

## 2020-07-26 DIAGNOSIS — G8929 Other chronic pain: Secondary | ICD-10-CM | POA: Diagnosis not present

## 2020-07-26 DIAGNOSIS — M542 Cervicalgia: Secondary | ICD-10-CM | POA: Diagnosis not present

## 2020-07-26 DIAGNOSIS — I10 Essential (primary) hypertension: Secondary | ICD-10-CM | POA: Diagnosis not present

## 2020-07-27 ENCOUNTER — Inpatient Hospital Stay: Payer: Medicare Other | Attending: Hematology and Oncology | Admitting: Hematology and Oncology

## 2020-07-27 NOTE — Assessment & Plan Note (Deleted)
09/11/2017:Left lumpectomy: Grade 2 invasive lobular cancer, 1.1 cm, LCIS, margins negative, 0/2 lymph nodes negative, ER 90% strong staining, PR negative, HER-2 positive ratio 2.62, Ki-67 1%, T1c N0 stage Ia  Recommendation: 1.Adjuvant therapy with Taxol Herceptin weekly x12 followed by Herceptin maintenance for 1 year completed October 2020 2.Adjuvant radiationcompleted 03/23/2018 3.Followed by adjuvant antiestrogen therapystarted 05/03/2018 ------------------------------------------------------------------ Current treatment:antiestrogen therapy with anastrozole 1 mg daily4/20/2020 tried letrozole but couldn't tolerate it, Recommended Exemestane patient decided not to try it) Chemo-induced peripheral neuropathy:Stable to improving  Diffuse musculoskeletal pains across the chest: Recent hospitalization at [facility]. Cardiac stress test was performed which was negative.  Bone scan negative for metastasis 05/13/2019.  Breast Cancer Surveillance: 1. Breast Exam: 07/27/2020 benign 2. Mammogram: 02/08/2020 benign Density Cat B [facility]) 3. Bone Density: 02/16/19: T score -1.4 Osteopenia: On Ca and Vit D 4.  Bone scan 05/13/2019: No evidence of bone metastases  Return to clinic in 1 year for follow-up

## 2020-07-30 DIAGNOSIS — Z20822 Contact with and (suspected) exposure to covid-19: Secondary | ICD-10-CM | POA: Diagnosis not present

## 2020-07-31 ENCOUNTER — Telehealth: Payer: Self-pay | Admitting: Hematology and Oncology

## 2020-07-31 NOTE — Telephone Encounter (Signed)
R/s appts per 7/19 sch msg. Pt aware.

## 2020-08-13 DIAGNOSIS — Z20822 Contact with and (suspected) exposure to covid-19: Secondary | ICD-10-CM | POA: Diagnosis not present

## 2020-08-15 NOTE — Progress Notes (Signed)
Patient Care Team: Manon Hilding, MD as PCP - General (Unknown Physician Specialty) Excell Seltzer, MD (Inactive) as Consulting Physician (General Surgery) Nicholas Lose, MD as Consulting Physician (Hematology and Oncology) Gery Pray, MD as Consulting Physician (Radiation Oncology) Gwyndolyn Kaufman, RN (Inactive) as Registered Nurse  DIAGNOSIS:    ICD-10-CM   1. Malignant neoplasm of upper-outer quadrant of left breast in female, estrogen receptor positive ([place])  C50.412    Z17.0       SUMMARY OF ONCOLOGIC HISTORY: Oncology History  Malignant neoplasm of upper-outer quadrant of left breast in female, estrogen receptor positive ([place])  08/05/2017 Initial Diagnosis   Screening detected left breast calcifications UOQ 1.1 cm biopsy revealed invasive lobular cancer with LCIS and CSL, grade 2, ER 90%, PR 0%, Ki-67 1%, HER-2 positive ratio 2.62, T2N0 stage Ia clinical stage AJCC 8   08/12/2017 Cancer Staging   Staging form: Breast, AJCC 8th Edition - Clinical stage from 08/12/2017: Stage IA (cT1c, cN0, cM0, G2, ER+, PR-, HER2+) - Signed by Nicholas Lose, MD on 08/12/2017    09/11/2017 Surgery   Left lumpectomy: Grade 2 invasive lobular cancer, 1.1 cm, LCIS, margins negative, 0/2 lymph nodes negative, ER 90% strong staining, PR negative, HER-2 positive ratio 2.62, Ki-67 1%, T1c N0 stage Ia    09/23/2017 Cancer Staging   Staging form: Breast, AJCC 8th Edition - Pathologic: Stage IA (pT1c, pN0, cM0, G2, ER+, PR-, HER2+) - Signed by Gardenia Phlegm, NP on 09/23/2017    11/02/2017 -  Chemotherapy   Adjuvant therapy with Taxol Herceptin weekly x12 followed by Herceptin maintenance for 1 year    02/25/2018 - 03/23/2018 Radiation Therapy   Adjuvant Radiation in [place], [place] [facility]).  Left Breast 42.56 Gy   05/03/2018 -  Anti-estrogen oral therapy   Anastrozole, 52m daily (plan for 7 years), discontinued 02/24/19 due to joint aches and pains     CHIEF COMPLIANT: Follow-up  of left breast cancer on letrozole  INTERVAL HISTORY: Sylvia MEDLINis a 75y.o. with above-mentioned history of left breast cancer treated with lumpectomy, adjuvant chemotherapy, radiation, Herceptin maintenance, and who is currently on anti-estrogen therapy with letrozole, after she could not tolerate anastrozole. She is a participant in the UpBeat clinical trial. She presents to the clinic today for follow-up.  She is tolerating letrozole extremely well without any problems or concerns.  ALLERGIES:  is allergic to fiorinal-codeine #3 [butalbital-asa-caff-codeine], lansoprazole, latex, butalbital-aspirin-caffeine, sulfa antibiotics, femara [letrozole], and sulfasalazine.  MEDICATIONS:  Current Outpatient Medications  Medication Sig Dispense Refill   atorvastatin (LIPITOR) 10 MG tablet Take 10 mg by mouth at bedtime.     bismuth subsalicylate (PEPTO BISMOL) 262 MG/15ML suspension Take 30 mLs by mouth every 6 (six) hours as needed for indigestion.     calcium-vitamin D (OSCAL WITH D) 500-200 MG-UNIT TABS tablet Take by mouth.     cephALEXin (KEFLEX) 250 MG capsule Take 1 capsule (250 mg total) by mouth at bedtime. 90 capsule 3   escitalopram (LEXAPRO) 5 MG tablet Take 5 mg by mouth at bedtime.      hydrochlorothiazide (MICROZIDE) 12.5 MG capsule Take 12.5 mg by mouth daily.     hydroxypropyl methylcellulose / hypromellose (ISOPTO TEARS / GONIOVISC) 2.5 % ophthalmic solution Place 1 drop into both eyes 3 (three) times daily as needed for dry eyes.     ibuprofen (ADVIL) 800 MG tablet Take 1 tablet (800 mg total) by mouth every 8 (eight) hours as needed. 30 tablet 0  ketoconazole (NIZORAL) 2 % cream      Loperamide HCl (IMODIUM PO) Take 2 mg by mouth daily.     Menthol, Topical Analgesic, 5 % PADS Apply 1 patch topically daily as needed (pain).     No current facility-administered medications for this visit.    PHYSICAL EXAMINATION: ECOG PERFORMANCE STATUS: 1 - Symptomatic but completely  ambulatory  Vitals:   08/16/20 1402  BP: (!) 161/84  Pulse: 73  Resp: 18  Temp: (!) 97.3 F (36.3 C)  SpO2: 95%   Filed Weights   08/16/20 1402  Weight: 198 lb 6.4 oz (90 kg)    BREAST: No palpable masses or nodules in either right or left breasts. No palpable axillary supraclavicular or infraclavicular adenopathy no breast tenderness or nipple discharge. (exam performed in the presence of a chaperone)  LABORATORY DATA:  I have reviewed the data as listed CMP Latest Ref Rng & Units 10/25/2018 09/06/2018 07/26/2018  Glucose 70 - 99 mg/dL 100(H) 119(H) 110(H)  BUN 8 - 23 mg/dL _0 Creatinine 0.44 - 1.00 mg/dL 0.87 0.82 0.95  Sodium 135 - 145 mmol/L 145 144 145  Potassium 3.5 - 5.1 mmol/L 3.7 3.8 3.7  Chloride 98 - 111 mmol/L 105 109 110  CO2 22 - 32 mmol/L _1 Calcium 8.9 - 10.3 mg/dL 9.4 9.0 8.8(L)  Total Protein 6.5 - 8.1 g/dL 6.8 6.4(L) 6.6  Total Bilirubin 0.3 - 1.2 mg/dL 0.5 0.4 0.3  Alkaline Phos 38 - 126 U/L 92 93 88  AST 15 - 41 U/L 13(L) 15 16  ALT 0 - 44 U/L _2 Lab Results  Component Value Date   WBC 5.8 10/25/2018   HGB 13.3 10/25/2018   HCT 39.3 10/25/2018   MCV 92.3 10/25/2018   PLT 155 10/25/2018   NEUTROABS 4.0 10/25/2018    ASSESSMENT & PLAN:  Malignant neoplasm of upper-outer quadrant of left breast in female, estrogen receptor positive (HCC) 09/11/2017:Left lumpectomy: Grade 2 invasive lobular cancer, 1.1 cm, LCIS, margins negative, 0/2 lymph nodes negative, ER 90% strong staining, PR negative, HER-2 positive ratio 2.62, Ki-67 1%, T1c N0 stage Ia   Recommendation: 1. Adjuvant therapy with Taxol Herceptin weekly x12 followed by Herceptin maintenance for 1 year completed October 2020 2.  Adjuvant radiation completed 03/23/2018 3.  Followed by adjuvant antiestrogen therapy started 05/03/2018 ------------------------------------------------------------------ Current treatment: antiestrogen therapy with anastrozole 1 mg daily 05/03/2018  tried letrozole but couldn't tolerate it, Recommended Exemestane (patient did not want to try it) Chemo-induced peripheral neuropathy: Stable to improving   Breast Cancer Surveillance: 1. Breast Exam: 08/16/2020: Benign 2. Mammogram: 02/08/2020: Benign Density Cat B [facility]) 3. Bone Density: 03/09/2019: T score -1.4 Osteopenia: On Ca and Vit D 4.  Bone scan 05/13/2019: No evidence of bone metastases   Return to clinic in 1 year for follow-up    No orders of the defined types were placed in this encounter.  The patient has a good understanding of the overall plan. she agrees with it. she will call with any problems that may develop before the next visit here.  Total time spent: 20 mins including face to face time and time spent for planning, charting and coordination of care  Rulon Eisenmenger, MD, MPH 08/16/2020  I, Thana Ates, am acting as scribe for Dr. Nicholas Lose.  I have reviewed the above documentation for accuracy and completeness, and I agree with the above.

## 2020-08-16 ENCOUNTER — Inpatient Hospital Stay: Payer: Medicare Other | Attending: Hematology and Oncology | Admitting: Hematology and Oncology

## 2020-08-16 ENCOUNTER — Other Ambulatory Visit: Payer: Self-pay

## 2020-08-16 DIAGNOSIS — Z923 Personal history of irradiation: Secondary | ICD-10-CM | POA: Insufficient documentation

## 2020-08-16 DIAGNOSIS — M8580 Other specified disorders of bone density and structure, unspecified site: Secondary | ICD-10-CM | POA: Insufficient documentation

## 2020-08-16 DIAGNOSIS — C50412 Malignant neoplasm of upper-outer quadrant of left female breast: Secondary | ICD-10-CM | POA: Diagnosis not present

## 2020-08-16 DIAGNOSIS — Z170 Estrogen receptor positive status [ER+]: Secondary | ICD-10-CM | POA: Diagnosis not present

## 2020-08-16 DIAGNOSIS — Z79811 Long term (current) use of aromatase inhibitors: Secondary | ICD-10-CM | POA: Diagnosis not present

## 2020-08-16 MED ORDER — HYDROCHLOROTHIAZIDE 12.5 MG PO CAPS: 25.0000 mg | ORAL_CAPSULE | Freq: Every day | ORAL | Status: DC

## 2020-08-16 NOTE — Assessment & Plan Note (Signed)
09/11/2017:Left lumpectomy: Grade 2 invasive lobular cancer, 1.1 cm, LCIS, margins negative, 0/2 lymph nodes negative, ER 90% strong staining, PR negative, HER-2 positive ratio 2.62, Ki-67 1%, T1c N0 stage Ia  Recommendation: 1.Adjuvant therapy with Taxol Herceptin weekly x12 followed by Herceptin maintenance for 1 year completed October 2020 2.Adjuvant radiationcompleted 03/23/2018 3.Followed by adjuvant antiestrogen therapystarted 05/03/2018 ------------------------------------------------------------------ Current treatment:antiestrogen therapy with anastrozole 1 mg daily4/20/2020 tried letrozole but couldn't tolerate it, Recommended Exemestane (patient did not want to try it) Chemo-induced peripheral neuropathy:Stable to improving  Diffuse musculoskeletal pains across the chest: Recent hospitalization at [facility]. Cardiac stress test was performed which was negative.  Bone scan negative for metastasis 05/13/2019.  Breast Cancer Surveillance: 1. Breast Exam: 08/16/2020: Benign 2. Mammogram: 02/08/2020: Benign Density Cat B [facility]) 3. Bone Density: 03/09/2019: T score -1.4 Osteopenia: On Ca and Vit D 4.  Bone scan 05/13/2019: No evidence of bone metastases  Return to clinic in 1 year for follow-up

## 2020-08-25 DIAGNOSIS — E7849 Other hyperlipidemia: Secondary | ICD-10-CM | POA: Diagnosis not present

## 2020-08-25 DIAGNOSIS — N183 Chronic kidney disease, stage 3 unspecified: Secondary | ICD-10-CM | POA: Diagnosis not present

## 2020-08-25 DIAGNOSIS — E7800 Pure hypercholesterolemia, unspecified: Secondary | ICD-10-CM | POA: Diagnosis not present

## 2020-08-25 DIAGNOSIS — I10 Essential (primary) hypertension: Secondary | ICD-10-CM | POA: Diagnosis not present

## 2020-08-25 DIAGNOSIS — R5383 Other fatigue: Secondary | ICD-10-CM | POA: Diagnosis not present

## 2020-08-25 DIAGNOSIS — R739 Hyperglycemia, unspecified: Secondary | ICD-10-CM | POA: Diagnosis not present

## 2020-08-28 DIAGNOSIS — D0500 Lobular carcinoma in situ of unspecified breast: Secondary | ICD-10-CM | POA: Diagnosis not present

## 2020-08-28 DIAGNOSIS — N183 Chronic kidney disease, stage 3 unspecified: Secondary | ICD-10-CM | POA: Diagnosis not present

## 2020-08-28 DIAGNOSIS — E7849 Other hyperlipidemia: Secondary | ICD-10-CM | POA: Diagnosis not present

## 2020-08-28 DIAGNOSIS — M8580 Other specified disorders of bone density and structure, unspecified site: Secondary | ICD-10-CM | POA: Diagnosis not present

## 2020-08-28 DIAGNOSIS — M545 Low back pain, unspecified: Secondary | ICD-10-CM | POA: Diagnosis not present

## 2020-08-28 DIAGNOSIS — F411 Generalized anxiety disorder: Secondary | ICD-10-CM | POA: Diagnosis not present

## 2020-08-28 DIAGNOSIS — M542 Cervicalgia: Secondary | ICD-10-CM | POA: Diagnosis not present

## 2020-08-28 DIAGNOSIS — I10 Essential (primary) hypertension: Secondary | ICD-10-CM | POA: Diagnosis not present

## 2020-09-11 DIAGNOSIS — R300 Dysuria: Secondary | ICD-10-CM | POA: Diagnosis not present

## 2020-09-13 DIAGNOSIS — R300 Dysuria: Secondary | ICD-10-CM | POA: Diagnosis not present

## 2020-09-26 DIAGNOSIS — Z20822 Contact with and (suspected) exposure to covid-19: Secondary | ICD-10-CM | POA: Diagnosis not present

## 2020-10-02 DIAGNOSIS — Z20822 Contact with and (suspected) exposure to covid-19: Secondary | ICD-10-CM | POA: Diagnosis not present

## 2020-10-03 DIAGNOSIS — H35373 Puckering of macula, bilateral: Secondary | ICD-10-CM | POA: Diagnosis not present

## 2020-10-03 DIAGNOSIS — H524 Presbyopia: Secondary | ICD-10-CM | POA: Diagnosis not present

## 2020-10-03 DIAGNOSIS — Z961 Presence of intraocular lens: Secondary | ICD-10-CM | POA: Diagnosis not present

## 2020-10-05 DIAGNOSIS — R300 Dysuria: Secondary | ICD-10-CM | POA: Diagnosis not present

## 2020-10-05 DIAGNOSIS — N12 Tubulo-interstitial nephritis, not specified as acute or chronic: Secondary | ICD-10-CM | POA: Diagnosis not present

## 2020-10-15 DIAGNOSIS — Z96653 Presence of artificial knee joint, bilateral: Secondary | ICD-10-CM | POA: Diagnosis not present

## 2020-10-15 DIAGNOSIS — M25552 Pain in left hip: Secondary | ICD-10-CM | POA: Diagnosis not present

## 2020-11-02 ENCOUNTER — Telehealth: Payer: Self-pay | Admitting: Oncology

## 2020-11-02 NOTE — Telephone Encounter (Signed)
11/02/20 - Upbeat Study - Year 3 follow-up phone call.  I called and spoke to the patient over the phone.  I informed her this was her 3 year follow-up for the Upbeat Study.  Patient stated she is doing good.  She has not been hospitalized or had any cardiovascular events in the last year.  I informed her that the questionnaire would be sent to her e-mail that she provided [place].  I thanked her for her continued support of this study and she would receive the next call in a year. Remer Macho 11/02/20 - 4:26 pm

## 2020-11-05 DIAGNOSIS — Z20822 Contact with and (suspected) exposure to covid-19: Secondary | ICD-10-CM | POA: Diagnosis not present

## 2020-11-07 ENCOUNTER — Other Ambulatory Visit: Payer: Self-pay

## 2020-11-07 ENCOUNTER — Encounter: Payer: Self-pay | Admitting: Dermatology

## 2020-11-07 ENCOUNTER — Ambulatory Visit (INDEPENDENT_AMBULATORY_CARE_PROVIDER_SITE_OTHER): Payer: Medicare Other | Admitting: Dermatology

## 2020-11-07 DIAGNOSIS — L820 Inflamed seborrheic keratosis: Secondary | ICD-10-CM

## 2020-11-07 DIAGNOSIS — S00511A Abrasion of lip, initial encounter: Secondary | ICD-10-CM

## 2020-11-07 DIAGNOSIS — Z85820 Personal history of malignant melanoma of skin: Secondary | ICD-10-CM | POA: Diagnosis not present

## 2020-11-07 DIAGNOSIS — T148XXA Other injury of unspecified body region, initial encounter: Secondary | ICD-10-CM

## 2020-11-07 DIAGNOSIS — Z1283 Encounter for screening for malignant neoplasm of skin: Secondary | ICD-10-CM

## 2020-11-07 DIAGNOSIS — D485 Neoplasm of uncertain behavior of skin: Secondary | ICD-10-CM

## 2020-11-07 DIAGNOSIS — B353 Tinea pedis: Secondary | ICD-10-CM | POA: Diagnosis not present

## 2020-11-07 NOTE — Patient Instructions (Addendum)
OVER THE COUNTER HYDROCORTISONE OINTMENT FOR THE LIPS  ANY WART FREEZE TREATMENT FOR THE FINGER X6 TREATMENTS   ANY OVER THE COUNTER TREATMENT FOR ATHLETES FOOT       Biopsy, Surgery (Curettage) & Surgery (Excision) Aftercare Instructions  1. Okay to remove bandage in 24 hours  2. Wash area with soap and water  3. Apply Vaseline to area twice daily until healed (Not Neosporin)  4. Okay to cover with a Band-Aid to decrease the chance of infection or prevent irritation from clothing; also it's okay to uncover lesion at home.  5. Suture instructions: return to our office in 7-10 or 10-14 days for a nurse visit for suture removal. Variable healing with sutures, if pain or itching occurs call our office. It's okay to shower or bathe 24 hours after sutures are given.  6. The following risks may occur after a biopsy, curettage or excision: bleeding, scarring, discoloration, recurrence, infection (redness, yellow drainage, pain or swelling).  7. For questions, concerns and results call our office at [place] before 4pm & Friday before 3pm. Biopsy results will be available in 1 week.

## 2020-11-23 ENCOUNTER — Encounter: Payer: Self-pay | Admitting: Dermatology

## 2020-11-23 NOTE — Progress Notes (Signed)
   Follow-Up Visit   Subjective  Sylvia Barnett is a 75 y.o. female who presents for the following: Annual Exam (Yearly skin check, history of mm on the back ).  History of melanoma, changing spot on abdomen, check feet. Location:  Duration:  Quality:  Associated Signs/Symptoms: Modifying Factors:  Severity:  Timing: Context:   Objective  Well appearing patient in no apparent distress; mood and affect are within normal limits. Right Flank 1 cm by chromic inflamed crust; dermoscopy favors I SK.  Patient requests removal because of discomfort.     Mid Lower [place] Lip Two millimeter dry hemorrhage which patient says is just a day old.  Does have a history of dry lips.    A full examination was performed including scalp, head, eyes, ears, nose, lips, neck, chest, axillae, abdomen, back, buttocks, bilateral upper extremities, bilateral lower extremities, hands, feet, fingers, toes, fingernails, and toenails. All findings within normal limits unless otherwise noted below.  Areas beneath undergarments not fully examined.   Assessment & Plan    Neoplasm of uncertain behavior of skin Right Flank  Skin / nail biopsy Type of biopsy: tangential   Informed consent: discussed and consent obtained   Timeout: patient name, date of birth, surgical site, and procedure verified   Anesthesia: the lesion was anesthetized in a standard fashion   Anesthetic:  1% lidocaine w/ epinephrine 1-100,000 local infiltration Instrument used: flexible razor blade   Hemostasis achieved with: aluminum chloride and electrodesiccation   Outcome: patient tolerated procedure well   Post-procedure details: wound care instructions given    Specimen 1 - Surgical pathology Differential Diagnosis: isk  Check Margins: No  Abrasion Mid Lower [place] Lip  Over the counter hydrocortisone ointment nightly for 1 week.  Return if area does not heal.  Tinea pedis of left foot Left Foot - Anterior  Over  the counter athlete foot treatment       I, Lavonna Monarch, MD, have reviewed all documentation for this visit.  The documentation on 11/23/20 for the exam, diagnosis, procedures, and orders are all accurate and complete.

## 2020-11-23 NOTE — Addendum Note (Signed)
Addended by: Lavonna Monarch on: 11/23/2020 06:20 PM   Modules accepted: Level of Service

## 2020-12-25 DIAGNOSIS — N183 Chronic kidney disease, stage 3 unspecified: Secondary | ICD-10-CM | POA: Diagnosis not present

## 2020-12-25 DIAGNOSIS — E782 Mixed hyperlipidemia: Secondary | ICD-10-CM | POA: Diagnosis not present

## 2020-12-25 DIAGNOSIS — E7801 Familial hypercholesterolemia: Secondary | ICD-10-CM | POA: Diagnosis not present

## 2020-12-25 DIAGNOSIS — E7800 Pure hypercholesterolemia, unspecified: Secondary | ICD-10-CM | POA: Diagnosis not present

## 2020-12-25 DIAGNOSIS — I10 Essential (primary) hypertension: Secondary | ICD-10-CM | POA: Diagnosis not present

## 2020-12-25 DIAGNOSIS — E7849 Other hyperlipidemia: Secondary | ICD-10-CM | POA: Diagnosis not present

## 2020-12-25 DIAGNOSIS — R739 Hyperglycemia, unspecified: Secondary | ICD-10-CM | POA: Diagnosis not present

## 2021-01-01 DIAGNOSIS — M8580 Other specified disorders of bone density and structure, unspecified site: Secondary | ICD-10-CM | POA: Diagnosis not present

## 2021-01-01 DIAGNOSIS — E7849 Other hyperlipidemia: Secondary | ICD-10-CM | POA: Diagnosis not present

## 2021-01-01 DIAGNOSIS — E782 Mixed hyperlipidemia: Secondary | ICD-10-CM | POA: Diagnosis not present

## 2021-01-01 DIAGNOSIS — I10 Essential (primary) hypertension: Secondary | ICD-10-CM | POA: Diagnosis not present

## 2021-01-01 DIAGNOSIS — M545 Low back pain, unspecified: Secondary | ICD-10-CM | POA: Diagnosis not present

## 2021-01-01 DIAGNOSIS — F331 Major depressive disorder, recurrent, moderate: Secondary | ICD-10-CM | POA: Diagnosis not present

## 2021-01-01 DIAGNOSIS — D0500 Lobular carcinoma in situ of unspecified breast: Secondary | ICD-10-CM | POA: Diagnosis not present

## 2021-01-01 DIAGNOSIS — M542 Cervicalgia: Secondary | ICD-10-CM | POA: Diagnosis not present

## 2021-01-01 DIAGNOSIS — N3090 Cystitis, unspecified without hematuria: Secondary | ICD-10-CM | POA: Diagnosis not present

## 2021-01-01 DIAGNOSIS — E876 Hypokalemia: Secondary | ICD-10-CM | POA: Diagnosis not present

## 2021-01-01 DIAGNOSIS — N183 Chronic kidney disease, stage 3 unspecified: Secondary | ICD-10-CM | POA: Diagnosis not present

## 2021-01-01 DIAGNOSIS — F411 Generalized anxiety disorder: Secondary | ICD-10-CM | POA: Diagnosis not present

## 2021-01-01 DIAGNOSIS — Z6832 Body mass index (BMI) 32.0-32.9, adult: Secondary | ICD-10-CM | POA: Diagnosis not present

## 2021-01-01 DIAGNOSIS — E870 Hyperosmolality and hypernatremia: Secondary | ICD-10-CM | POA: Diagnosis not present

## 2021-01-03 ENCOUNTER — Ambulatory Visit: Payer: Medicare Other | Admitting: Dermatology

## 2021-01-15 DIAGNOSIS — R300 Dysuria: Secondary | ICD-10-CM | POA: Diagnosis not present

## 2021-01-15 DIAGNOSIS — Z6833 Body mass index (BMI) 33.0-33.9, adult: Secondary | ICD-10-CM | POA: Diagnosis not present

## 2021-01-15 DIAGNOSIS — M545 Low back pain, unspecified: Secondary | ICD-10-CM | POA: Diagnosis not present

## 2021-01-21 DIAGNOSIS — M545 Low back pain, unspecified: Secondary | ICD-10-CM | POA: Diagnosis not present

## 2021-01-22 DIAGNOSIS — H524 Presbyopia: Secondary | ICD-10-CM | POA: Diagnosis not present

## 2021-02-21 DIAGNOSIS — M545 Low back pain, unspecified: Secondary | ICD-10-CM | POA: Diagnosis not present

## 2021-02-21 DIAGNOSIS — M48061 Spinal stenosis, lumbar region without neurogenic claudication: Secondary | ICD-10-CM | POA: Diagnosis not present

## 2021-02-27 DIAGNOSIS — C50412 Malignant neoplasm of upper-outer quadrant of left female breast: Secondary | ICD-10-CM | POA: Diagnosis not present

## 2021-02-27 DIAGNOSIS — Z9889 Other specified postprocedural states: Secondary | ICD-10-CM | POA: Diagnosis not present

## 2021-02-27 DIAGNOSIS — R922 Inconclusive mammogram: Secondary | ICD-10-CM | POA: Diagnosis not present

## 2021-02-28 DIAGNOSIS — M545 Low back pain, unspecified: Secondary | ICD-10-CM | POA: Diagnosis not present

## 2021-03-05 DIAGNOSIS — M47816 Spondylosis without myelopathy or radiculopathy, lumbar region: Secondary | ICD-10-CM | POA: Diagnosis not present

## 2021-03-05 DIAGNOSIS — M5136 Other intervertebral disc degeneration, lumbar region: Secondary | ICD-10-CM | POA: Diagnosis not present

## 2021-03-11 DIAGNOSIS — M4306 Spondylolysis, lumbar region: Secondary | ICD-10-CM | POA: Diagnosis not present

## 2021-03-14 DIAGNOSIS — M4306 Spondylolysis, lumbar region: Secondary | ICD-10-CM | POA: Diagnosis not present

## 2021-03-18 DIAGNOSIS — M4306 Spondylolysis, lumbar region: Secondary | ICD-10-CM | POA: Diagnosis not present

## 2021-03-21 DIAGNOSIS — M4306 Spondylolysis, lumbar region: Secondary | ICD-10-CM | POA: Diagnosis not present

## 2021-03-25 DIAGNOSIS — M4306 Spondylolysis, lumbar region: Secondary | ICD-10-CM | POA: Diagnosis not present

## 2021-03-28 DIAGNOSIS — M4306 Spondylolysis, lumbar region: Secondary | ICD-10-CM | POA: Diagnosis not present

## 2021-04-01 DIAGNOSIS — M4306 Spondylolysis, lumbar region: Secondary | ICD-10-CM | POA: Diagnosis not present

## 2021-04-04 DIAGNOSIS — M4306 Spondylolysis, lumbar region: Secondary | ICD-10-CM | POA: Diagnosis not present

## 2021-04-08 DIAGNOSIS — M4306 Spondylolysis, lumbar region: Secondary | ICD-10-CM | POA: Diagnosis not present

## 2021-04-11 DIAGNOSIS — M4306 Spondylolysis, lumbar region: Secondary | ICD-10-CM | POA: Diagnosis not present

## 2021-04-15 DIAGNOSIS — M4306 Spondylolysis, lumbar region: Secondary | ICD-10-CM | POA: Diagnosis not present

## 2021-04-18 DIAGNOSIS — E7849 Other hyperlipidemia: Secondary | ICD-10-CM | POA: Diagnosis not present

## 2021-04-18 DIAGNOSIS — E782 Mixed hyperlipidemia: Secondary | ICD-10-CM | POA: Diagnosis not present

## 2021-04-18 DIAGNOSIS — E876 Hypokalemia: Secondary | ICD-10-CM | POA: Diagnosis not present

## 2021-04-18 DIAGNOSIS — N3090 Cystitis, unspecified without hematuria: Secondary | ICD-10-CM | POA: Diagnosis not present

## 2021-04-18 DIAGNOSIS — E870 Hyperosmolality and hypernatremia: Secondary | ICD-10-CM | POA: Diagnosis not present

## 2021-05-10 DIAGNOSIS — E7849 Other hyperlipidemia: Secondary | ICD-10-CM | POA: Diagnosis not present

## 2021-05-10 DIAGNOSIS — D0500 Lobular carcinoma in situ of unspecified breast: Secondary | ICD-10-CM | POA: Diagnosis not present

## 2021-05-10 DIAGNOSIS — N183 Chronic kidney disease, stage 3 unspecified: Secondary | ICD-10-CM | POA: Diagnosis not present

## 2021-05-10 DIAGNOSIS — M545 Low back pain, unspecified: Secondary | ICD-10-CM | POA: Diagnosis not present

## 2021-05-10 DIAGNOSIS — F411 Generalized anxiety disorder: Secondary | ICD-10-CM | POA: Diagnosis not present

## 2021-05-10 DIAGNOSIS — F331 Major depressive disorder, recurrent, moderate: Secondary | ICD-10-CM | POA: Diagnosis not present

## 2021-05-10 DIAGNOSIS — I10 Essential (primary) hypertension: Secondary | ICD-10-CM | POA: Diagnosis not present

## 2021-05-10 DIAGNOSIS — M542 Cervicalgia: Secondary | ICD-10-CM | POA: Diagnosis not present

## 2021-05-16 DIAGNOSIS — M47816 Spondylosis without myelopathy or radiculopathy, lumbar region: Secondary | ICD-10-CM | POA: Diagnosis not present

## 2021-05-23 DIAGNOSIS — M47816 Spondylosis without myelopathy or radiculopathy, lumbar region: Secondary | ICD-10-CM | POA: Diagnosis not present

## 2021-06-06 DIAGNOSIS — M47816 Spondylosis without myelopathy or radiculopathy, lumbar region: Secondary | ICD-10-CM | POA: Diagnosis not present

## 2021-07-13 ENCOUNTER — Other Ambulatory Visit: Payer: Self-pay | Admitting: Nurse Practitioner

## 2021-08-02 DIAGNOSIS — N390 Urinary tract infection, site not specified: Secondary | ICD-10-CM | POA: Diagnosis not present

## 2021-08-02 DIAGNOSIS — Z6827 Body mass index (BMI) 27.0-27.9, adult: Secondary | ICD-10-CM | POA: Diagnosis not present

## 2021-08-11 NOTE — Progress Notes (Signed)
Patient Care Team: Manon Hilding, MD as PCP - General (Unknown Physician Specialty) Excell Seltzer, MD (Inactive) as Consulting Physician (General Surgery) Nicholas Lose, MD as Consulting Physician (Hematology and Oncology) Gery Pray, MD as Consulting Physician (Radiation Oncology) Gwyndolyn Kaufman, RN (Inactive) as Registered Nurse  DIAGNOSIS:  Encounter Diagnosis  Name Primary?   Malignant neoplasm of upper-outer quadrant of left breast in female, estrogen receptor positive ([place])     SUMMARY OF ONCOLOGIC HISTORY: Oncology History  Malignant neoplasm of upper-outer quadrant of left breast in female, estrogen receptor positive ([place])  08/05/2017 Initial Diagnosis   Screening detected left breast calcifications UOQ 1.1 cm biopsy revealed invasive lobular cancer with LCIS and CSL, grade 2, ER 90%, PR 0%, Ki-67 1%, HER-2 positive ratio 2.62, T2N0 stage Ia clinical stage AJCC 8   08/12/2017 Cancer Staging   Staging form: Breast, AJCC 8th Edition - Clinical stage from 08/12/2017: Stage IA (cT1c, cN0, cM0, G2, ER+, PR-, HER2+) - Signed by Nicholas Lose, MD on 08/12/2017   09/11/2017 Surgery   Left lumpectomy: Grade 2 invasive lobular cancer, 1.1 cm, LCIS, margins negative, 0/2 lymph nodes negative, ER 90% strong staining, PR negative, HER-2 positive ratio 2.62, Ki-67 1%, T1c N0 stage Ia   09/23/2017 Cancer Staging   Staging form: Breast, AJCC 8th Edition - Pathologic: Stage IA (pT1c, pN0, cM0, G2, ER+, PR-, HER2+) - Signed by Gardenia Phlegm, NP on 09/23/2017   11/02/2017 -  Chemotherapy   Adjuvant therapy with Taxol Herceptin weekly x12 followed by Herceptin maintenance for 1 year   02/25/2018 - 03/23/2018 Radiation Therapy   Adjuvant Radiation in [place], [place] [facility]).  Left Breast 42.56 Gy   05/03/2018 -  Anti-estrogen oral therapy   Anastrozole, 68m daily (plan for 7 years), discontinued 02/24/19 due to joint aches and pains     CHIEF COMPLIANT: Follow-up breast  cancer surveillance  INTERVAL HISTORY: Sylvia MAUSOLFis a 76y.o female with the above mention. She presents to the clinic today for a follow-up. Denies pain and discomfort in breast. Overall she is doing good.  She has fractured her wrist and her elbow with the different sports related injuries playing with her grandkids.  Otherwise she is doing quite well.  She denies any lumps or nodules in the breast.   ALLERGIES:  is allergic to fiorinal-codeine #3 [butalbital-asa-caff-codeine], lansoprazole, latex, butalbital-aspirin-caffeine, sulfa antibiotics, oxycodone-acetaminophen, femara [letrozole], and sulfasalazine.  MEDICATIONS:  Current Outpatient Medications  Medication Sig Dispense Refill   Ascorbic Acid (VITAMIN C) 1000 MG tablet Take 1 tablet (1,000 mg total) by mouth daily.     atorvastatin (LIPITOR) 10 MG tablet Take 10 mg by mouth at bedtime.     calcium-vitamin D (OSCAL WITH D) 500-200 MG-UNIT TABS tablet Take by mouth.     escitalopram (LEXAPRO) 5 MG tablet Take 5 mg by mouth at bedtime.      hydroxypropyl methylcellulose / hypromellose (ISOPTO TEARS / GONIOVISC) 2.5 % ophthalmic solution Place 1 drop into both eyes 3 (three) times daily as needed for dry eyes.     ketoconazole (NIZORAL) 2 % cream  (Patient not taking: Reported on 11/07/2020)     Loperamide HCl (IMODIUM PO) Take 2 mg by mouth daily.     Menthol, Topical Analgesic, 5 % PADS Apply 1 patch topically daily as needed (pain).     No current facility-administered medications for this visit.    PHYSICAL EXAMINATION: ECOG PERFORMANCE STATUS: 1 - Symptomatic but completely ambulatory  Vitals:  08/19/21 1407  BP: 132/72  Pulse: 85  Resp: 18  Temp: 97.8 F (36.6 C)  SpO2: 99%   Filed Weights   08/19/21 1407  Weight: 198 lb (89.8 kg)    BREAST: No palpable masses or nodules in either right or left breasts. No palpable axillary supraclavicular or infraclavicular adenopathy no breast tenderness or nipple  discharge. (exam performed in the presence of a chaperone)  LABORATORY DATA:  I have reviewed the data as listed    Latest Ref Rng & Units 10/25/2018    8:55 AM 09/06/2018    8:26 AM 07/26/2018    9:10 AM  CMP  Glucose 70 - 99 mg/dL 100  119  110   BUN 8 - 23 mg/dL '18  23  21   ' Creatinine 0.44 - 1.00 mg/dL 0.87  0.82  0.95   Sodium 135 - 145 mmol/L 145  144  145   Potassium 3.5 - 5.1 mmol/L 3.7  3.8  3.7   Chloride 98 - 111 mmol/L 105  109  110   CO2 22 - 32 mmol/L '28  25  26   ' Calcium 8.9 - 10.3 mg/dL 9.4  9.0  8.8   Total Protein 6.5 - 8.1 g/dL 6.8  6.4  6.6   Total Bilirubin 0.3 - 1.2 mg/dL 0.5  0.4  0.3   Alkaline Phos 38 - 126 U/L 92  93  88   AST 15 - 41 U/L '13  15  16   ' ALT 0 - 44 U/L '15  20  17     ' Lab Results  Component Value Date   WBC 5.8 10/25/2018   HGB 13.3 10/25/2018   HCT 39.3 10/25/2018   MCV 92.3 10/25/2018   PLT 155 10/25/2018   NEUTROABS 4.0 10/25/2018    ASSESSMENT & PLAN:  Malignant neoplasm of upper-outer quadrant of left breast in female, estrogen receptor positive ([facility]CC) 09/11/2017:Left lumpectomy: Grade 2 invasive lobular cancer, 1.1 cm, LCIS, margins negative, 0/2 lymph nodes negative, ER 90% strong staining, PR negative, HER-2 positive ratio 2.62, Ki-67 1%, T1c N0 stage Ia   Recommendation: 1. Adjuvant therapy with Taxol Herceptin weekly x12 followed by Herceptin maintenance for 1 year completed October 2020 2.  Adjuvant radiation completed 03/23/2018 3.  Followed by adjuvant antiestrogen therapy started 05/03/2018 ------------------------------------------------------------------ Current treatment: antiestrogen therapy with anastrozole 1 mg daily 05/03/2018 tried letrozole but couldn't tolerate it, Recommended Exemestane (patient did not want to try it) Chemo-induced peripheral neuropathy: Stable to improving   Breast Cancer Surveillance: 1. Breast Exam: : Benign 2. Mammogram:  02/27/2021: Benign Density Cat B [facility]) 3. Bone Density:  03/09/2019: T score -1.4 Osteopenia: On Ca and Vit D 4.  Bone scan 05/13/2019: No evidence of bone metastases   Return to clinic in 1 year for follow-up and after that we can see her on an as-needed basis.    No orders of the defined types were placed in this encounter.  The patient has a good understanding of the overall plan. she agrees with it. she will call with any problems that may develop before the next visit here. Total time spent: 30 mins including face to face time and time spent for planning, charting and co-ordination of care   Harriette Ohara, MD 08/19/21    I Gardiner Coins am scribing for Dr. Lindi Adie  I have reviewed the above documentation for accuracy and completeness, and I agree with the above.

## 2021-08-19 ENCOUNTER — Other Ambulatory Visit: Payer: Self-pay

## 2021-08-19 ENCOUNTER — Inpatient Hospital Stay: Payer: Medicare HMO | Attending: Hematology and Oncology | Admitting: Hematology and Oncology

## 2021-08-19 DIAGNOSIS — Z79811 Long term (current) use of aromatase inhibitors: Secondary | ICD-10-CM | POA: Diagnosis not present

## 2021-08-19 DIAGNOSIS — Z170 Estrogen receptor positive status [ER+]: Secondary | ICD-10-CM

## 2021-08-19 DIAGNOSIS — C50412 Malignant neoplasm of upper-outer quadrant of left female breast: Secondary | ICD-10-CM | POA: Diagnosis not present

## 2021-08-19 MED ORDER — VITAMIN C 1000 MG PO TABS: 1000.0000 mg | ORAL_TABLET | Freq: Every day | ORAL | Status: DC

## 2021-08-19 NOTE — Assessment & Plan Note (Signed)
09/11/2017:Left lumpectomy: Grade 2 invasive lobular cancer, 1.1 cm, LCIS, margins negative, 0/2 lymph nodes negative, ER 90% strong staining, PR negative, HER-2 positive ratio 2.62, Ki-67 1%, T1c N0 stage Ia  Recommendation: 1.Adjuvant therapy with Taxol Herceptin weekly x12 followed by Herceptin maintenance for 1 year completed October 2020 2.Adjuvant radiationcompleted 03/23/2018 3.Followed by adjuvant antiestrogen therapystarted 05/03/2018 ------------------------------------------------------------------ Current treatment:antiestrogen therapy with anastrozole 1 mg daily4/20/2020tried letrozole but couldn't tolerate it, Recommended Exemestane (patient did not want to try it) Chemo-induced peripheral neuropathy:Stable to improving  Breast Cancer Surveillance: 1. Breast Exam: : Benign 2. Mammogram:  02/27/2021: Benign Density Cat B [facility]) 3. Bone Density: 03/09/2019: T score -1.4 Osteopenia: On Ca and Vit D 4.Bone scan 05/13/2019: No evidence of bone metastases  Return to clinic in 1 year for follow-up

## 2021-10-18 DIAGNOSIS — N183 Chronic kidney disease, stage 3 unspecified: Secondary | ICD-10-CM | POA: Diagnosis not present

## 2021-10-18 DIAGNOSIS — I10 Essential (primary) hypertension: Secondary | ICD-10-CM | POA: Diagnosis not present

## 2021-10-18 DIAGNOSIS — E7801 Familial hypercholesterolemia: Secondary | ICD-10-CM | POA: Diagnosis not present

## 2021-10-18 DIAGNOSIS — E7849 Other hyperlipidemia: Secondary | ICD-10-CM | POA: Diagnosis not present

## 2021-10-28 DIAGNOSIS — D0500 Lobular carcinoma in situ of unspecified breast: Secondary | ICD-10-CM | POA: Diagnosis not present

## 2021-10-28 DIAGNOSIS — E7849 Other hyperlipidemia: Secondary | ICD-10-CM | POA: Diagnosis not present

## 2021-10-28 DIAGNOSIS — Z0001 Encounter for general adult medical examination with abnormal findings: Secondary | ICD-10-CM | POA: Diagnosis not present

## 2021-10-28 DIAGNOSIS — M542 Cervicalgia: Secondary | ICD-10-CM | POA: Diagnosis not present

## 2021-10-28 DIAGNOSIS — M545 Low back pain, unspecified: Secondary | ICD-10-CM | POA: Diagnosis not present

## 2021-10-28 DIAGNOSIS — M8580 Other specified disorders of bone density and structure, unspecified site: Secondary | ICD-10-CM | POA: Diagnosis not present

## 2021-10-28 DIAGNOSIS — F411 Generalized anxiety disorder: Secondary | ICD-10-CM | POA: Diagnosis not present

## 2021-10-28 DIAGNOSIS — N183 Chronic kidney disease, stage 3 unspecified: Secondary | ICD-10-CM | POA: Diagnosis not present

## 2021-10-28 DIAGNOSIS — F331 Major depressive disorder, recurrent, moderate: Secondary | ICD-10-CM | POA: Diagnosis not present

## 2021-11-01 ENCOUNTER — Telehealth: Payer: Self-pay | Admitting: Oncology

## 2021-11-01 NOTE — Telephone Encounter (Signed)
11/01/21 - Upbeat Study - Year 4 phone call visit.  Called and spoke to the patient over the phone.  I informed her this was her year 4 follow-up visit.  Patient stated she had not been in the hospital or had any cardiovascular events in the last year.  I asked if she wanted the questionnaires by e-mail or do them over the phone.  She wanted them sent by e-mail.  I confirmed her e-mail address was correct.  I thanked the patient for her continued support in this clinical trial and that she would receive a phone call again next year. Remer Macho 11/01/21 - 12:55 pm

## 2021-11-11 ENCOUNTER — Ambulatory Visit: Payer: Medicare Other | Admitting: Dermatology

## 2021-11-11 DIAGNOSIS — M810 Age-related osteoporosis without current pathological fracture: Secondary | ICD-10-CM | POA: Diagnosis not present

## 2021-11-11 DIAGNOSIS — M8589 Other specified disorders of bone density and structure, multiple sites: Secondary | ICD-10-CM | POA: Diagnosis not present

## 2021-11-19 DIAGNOSIS — M47816 Spondylosis without myelopathy or radiculopathy, lumbar region: Secondary | ICD-10-CM | POA: Diagnosis not present

## 2021-11-27 ENCOUNTER — Telehealth: Payer: Self-pay | Admitting: Oncology

## 2021-11-27 NOTE — Telephone Encounter (Signed)
11/27/21 - Upbeat Study - Patient called and told me she has not received the questionnaire from [facility] for the study yet.  I offered to have them resend it or she could do them over the phone. She completed the questionnaire over the phone.  I thanked the patient for her continued support in this clinical trail. Remer Macho 11/27/21 - 11:20 am

## 2021-12-01 ENCOUNTER — Encounter (INDEPENDENT_AMBULATORY_CARE_PROVIDER_SITE_OTHER): Payer: Self-pay | Admitting: Gastroenterology

## 2021-12-03 ENCOUNTER — Ambulatory Visit (INDEPENDENT_AMBULATORY_CARE_PROVIDER_SITE_OTHER): Payer: Medicare HMO | Admitting: Physician Assistant

## 2021-12-03 VITALS — BP 129/82 | HR 72

## 2021-12-03 DIAGNOSIS — R300 Dysuria: Secondary | ICD-10-CM | POA: Diagnosis not present

## 2021-12-03 DIAGNOSIS — N3281 Overactive bladder: Secondary | ICD-10-CM | POA: Diagnosis not present

## 2021-12-03 DIAGNOSIS — N3000 Acute cystitis without hematuria: Secondary | ICD-10-CM | POA: Diagnosis not present

## 2021-12-03 DIAGNOSIS — Z87440 Personal history of urinary (tract) infections: Secondary | ICD-10-CM | POA: Diagnosis not present

## 2021-12-03 MED ORDER — DOXYCYCLINE HYCLATE 100 MG PO CAPS: 100.0000 mg | ORAL_CAPSULE | Freq: Two times a day (BID) | ORAL | 0 refills | Status: DC

## 2021-12-03 NOTE — Progress Notes (Unsigned)
Pt here today for bladder scan. Bladder was scanned and 18 was visualized.    Performed by Marisue Brooklyn, CMA  Additional follow up Follow up as scheduled   MDX [phone] Tracking# [id number]

## 2021-12-03 NOTE — Progress Notes (Signed)
Assessment: 1. Acute cystitis without hematuria - Urinalysis, Routine w reflex microscopic - BLADDER SCAN AMB NON-IMAGING - doxycycline (VIBRAMYCIN) 100 MG capsule; Take 1 capsule (100 mg total) by mouth every 12 (twelve) hours.  Dispense: 14 capsule; Refill: 0 - Microscopic Examination  2. OAB (overactive bladder) - mirabegron ER (MYRBETRIQ) 25 MG TB24 tablet; Take 1 tablet (25 mg total) by mouth daily.  Dispense: 28 tablet; Refill: 0  3. History of recurrent UTIs    Plan: MDX culture ordered and the patient is started on doxycycline.  Samples of Myrbetriq 25 mg given. Discussed antibiotic stewardship and creation of resistance by taking and prescribed antibiotics as she has been doing.  Recommended that she only take antibiotic medications when prescribed at the time of her diagnosis.  She will follow-up in 3 to 4 weeks for recheck urinalysis.  Medical record request from her primary care provider for UA and culture results over the past 6 months.  The patient requests follow-up with Dr. Alyson Ingles specifically.  Meds ordered this encounter  Medications   doxycycline (VIBRAMYCIN) 100 MG capsule    Sig: Take 1 capsule (100 mg total) by mouth every 12 (twelve) hours.    Dispense:  14 capsule    Refill:  0     Chief Complaint: No chief complaint on file.   HPI: Sylvia Barnett is a 76 y.o. female who presents for acute work in appointment for sxs of UTI including urinary frequency, urgency, burning, occasional urge incontinence.  Since the patient was last seen more than 2 years ago, she reports to episodes of UTI symptoms both treated with antibiotics, first Cipro, then Macrodantin.  The patient is unsure of whether cultures were obtained through her primary care provider where she was treated.  Since worsening of symptoms over the past 2 days, she has taken 1 or 2 cephalexin that she had from an old prescription.  She brings multiple antibiotics with her and states she will just  start one of them when she feels infected.  No current fever, nausea vomiting, abdominal pain.  No gross hematuria.  Patient reports her OAB symptoms did improve in the past on Myrbetriq, but her insurance would not cover it at the time.  UA= granular casts with greater than 30 WBCs noted.  Few bacteria.  Nitrite negative PVR=6m  11/22/19 Ms JLandgrenis a [id number] for followup for urinary urgency and recurrent. Last visit she was stated on keflex 250 prophylaxis but she had a breakthrough about 2 months ago and was treated with amoxicillin for a dental issue and that cleared the voiding symptoms.  No dysuria or hematuria. She has urgency and occasional UUI.  She got Myrbetriq samples and they helped but it was too expensive.  She has failed detrol and vesicare in the past.  She had PTNS for 2 years but that didn't work as well as the [employer]  She had urinary frequency every 2-2.5 hours. She has nocturia x 2.  She has had some right back pain and she thinks that is from a UTI but her UA is clear today.   Portions of the above documentation were copied from a prior visit for review purposes only.  Allergies: Allergies  Allergen Reactions   Fiorinal-Codeine #3 [Butalbital-Asa-Caff-Codeine] Rash   Lansoprazole Swelling   Latex Swelling, Other (See Comments), Itching and Rash    blisters   Butalbital-Aspirin-Caffeine Rash   Sulfa Antibiotics Rash   Oxycodone-Acetaminophen    Femara [Letrozole] Rash  Rash when tried in 1984   Sulfasalazine Rash    PMH: Past Medical History:  Diagnosis Date   Anxiety    Since in her 20's   Arthritis 2013   Right hip   Cancer [facility]) 2004    Breast Lumpectomy   Complication of anesthesia    Has a small throat   Depression    Started when she was in her 20's   Hypercholesteremia 2012   Hypertension 2013   IBS (irritable bowel syndrome) 2018   per GI note   Incontinence-urinary 2012   Lobular carcinoma in situ (LCIS) of left breast 09/08/2008    Melanoma of back [facility]) 1994   Excision with clear margins   Microscopic colitis 2015   Resolved 2016    PSH: Past Surgical History:  Procedure Laterality Date   ABDOMINAL HYSTERECTOMY  2002   APPENDECTOMY  1949   BIOPSY  02/26/2018   Procedure: BIOPSY;  Surgeon: Rogene Houston, MD;  [facility];  Service: Endoscopy;;  gastric   BIOPSY BREAST  2010   Left   Bladder Tack  2002   BREAST LUMPECTOMY  2004   Left   BREAST LUMPECTOMY  2004   Left   BREAST LUMPECTOMY  2006   Left   BREAST LUMPECTOMY  2000   Left   BREAST LUMPECTOMY WITH RADIOACTIVE SEED AND SENTINEL LYMPH NODE BIOPSY Left 09/11/2017   Procedure: LEFT BREAST LUMPECTOMY X2 WITH RADIOACTIVE SEED AND LEFT AXILLARY SENTINEL LYMPH NODE BIOPSY;  Surgeon: Excell Seltzer, MD;  Location: [place];  Service: General;  Laterality: Left;   BUNIONECTOMY  1994   Right   CATARACT EXTRACTION W/PHACO Left 08/18/2013   Procedure: CATARACT EXTRACTION PHACO AND INTRAOCULAR LENS PLACEMENT ([place]);  Surgeon: Tonny Branch, MD;  [facility];  Service: Ophthalmology;  Laterality: Left;  CDE: 8.62   COLONOSCOPY N/A 11/10/2013   Procedure: COLONOSCOPY;  Surgeon: Rogene Houston, MD;  [facility];  Service: Endoscopy;  Laterality: N/A;  240   COLONOSCOPY N/A 12/30/2018   Procedure: COLONOSCOPY;  Surgeon: Rogene Houston, MD;  [facility];  Service: Endoscopy;  Laterality: N/A;  12   ESOPHAGOGASTRODUODENOSCOPY N/A 02/26/2018   Procedure: ESOPHAGOGASTRODUODENOSCOPY (EGD);  Surgeon: Rogene Houston, MD;  [facility];  Service: Endoscopy;  Laterality: N/A;  [place]   Catarct Right   EYE SURGERY  08-2011   Right Yag procedure   JOINT REPLACEMENT  08-2010   Left total knee   KNEE ARTHROSCOPY  2010   Right   KNEE ARTHROSCOPY  2003   Left   POLYPECTOMY  12/30/2018   Procedure: POLYPECTOMY;  Surgeon: Rogene Houston, MD;  [facility];  Service: Endoscopy;;  hepatic  flexure,transverse colon     PORTACATH PLACEMENT N/A 09/11/2017   Procedure: INSERTION PORT-A-CATH;  Surgeon: Excell Seltzer, MD;  Location: [place];  Service: General;  Laterality: N/A;   TOTAL KNEE ARTHROPLASTY  11/03/2011   Procedure: TOTAL KNEE ARTHROPLASTY;  Surgeon: Gearlean Alf, MD;  [facility];  Service: Orthopedics;  Laterality: Right;   TUBAL LIGATION     WRIST SURGERY Bilateral 03-2010   WRIST SURGERY  2008   Right    SH: Social History   Tobacco Use   Smoking status: Never   Smokeless tobacco: Never  Vaping Use   Vaping Use: Never used  Substance Use Topics   Alcohol use: No  Alcohol/week: 0.0 standard drinks of alcohol   Drug use: No    ROS: All other review of systems were reviewed and are negative except what is noted above in HPI  PE: BP 129/82   Pulse 72  GENERAL APPEARANCE:  Well appearing, well developed, well nourished, NAD HEENT:  Atraumatic, normocephalic NECK:  Supple. Trachea midline ABDOMEN:  Soft, non-tender, no masses EXTREMITIES:  Moves all extremities well, without clubbing, cyanosis, or edema NEUROLOGIC:  Alert and oriented x 3, normal gait, CN II-XII grossly intact MENTAL STATUS:  appropriate BACK:  Non-tender to palpation, No CVAT SKIN:  Warm, dry, and intact   Results: Laboratory Data: Lab Results  Component Value Date   WBC 5.8 10/25/2018   HGB 13.3 10/25/2018   HCT 39.3 10/25/2018   MCV 92.3 10/25/2018   PLT 155 10/25/2018    Lab Results  Component Value Date   CREATININE 0.87 10/25/2018    No results found for: "PSA"  No results found for: "TESTOSTERONE"  No results found for: "HGBA1C"  Urinalysis    Component Value Date/Time   COLORURINE YELLOW 12/03/2018 1731   APPEARANCEUR Clear 12/02/2019 1014   LABSPEC 1.018 12/03/2018 1731   PHURINE 5.0 12/03/2018 1731   GLUCOSEU Negative 12/02/2019 1014   HGBUR SMALL (A) 12/03/2018 1731   BILIRUBINUR Negative 12/02/2019 1014   KETONESUR NEGATIVE  12/03/2018 1731   PROTEINUR Trace (A) 12/02/2019 1014   PROTEINUR NEGATIVE 12/03/2018 1731   UROBILINOGEN negative (A) 06/03/2019 1106   UROBILINOGEN 0.2 10/28/2011 1315   NITRITE Negative 12/02/2019 1014   NITRITE POSITIVE (A) 12/03/2018 1731   LEUKOCYTESUR 1+ (A) 12/02/2019 1014   LEUKOCYTESUR MODERATE (A) 12/03/2018 1731    Lab Results  Component Value Date   LABMICR Comment 12/02/2019   BACTERIA MANY (A) 12/03/2018    Pertinent Imaging: No results found for this or any previous visit.  No results found for this or any previous visit.  No results found for this or any previous visit.  No results found for this or any previous visit.  Results for orders placed during the hospital encounter of 12/14/17  US RENAL  Narrative CLINICAL DATA:  Possible cyst in the right ovary  EXAM: RENAL / URINARY TRACT ULTRASOUND COMPLETE  COMPARISON:  Limited views of the kidneys from a cardiac research MRI dated October 15, 2017.  FINDINGS: Right Kidney:  Renal measurements: 10.7 x 5.8 x 5 cm = volume: 162 mL. The renal cortical echotexture remains lower than that of the liver. There is moderate hydronephrosis.  Left Kidney:  Renal measurements: 12.3 x 5.9 x 5.4 cm = volume: 204 mL. There is no left-sided hydronephrosis. There is a large upper pole cyst measuring 7 x 6.1 x 7.3 cm. No internal echoes are observed. The cortical echotexture is similar to that on the right.  Bladder:  The partially distended urinary bladder is normal. There is a moderate-sized postvoid residual volume. No ureteral jets were observed.  IMPRESSION: Moderate right-sided hydronephrosis.  No left-sided hydronephrosis.  Large simple appearing exophytic left upper pole cyst measuring 7.3 cm in greatest dimension.  Moderate-sized postvoid residual urinary bladder volume.   Electronically Signed By: David  Martinique M.D. On: 12/15/2017 11:36  No valid procedures specified. No results found for  this or any previous visit.  No results found for this or any previous visit.  No results found for this or any previous visit (from the past 24 hour(s)).

## 2021-12-04 LAB — URINALYSIS, ROUTINE W REFLEX MICROSCOPIC
Bilirubin, UA: NEGATIVE
Glucose, UA: NEGATIVE
Ketones, UA: NEGATIVE
Nitrite, UA: NEGATIVE
Protein,UA: NEGATIVE
Specific Gravity, UA: 1.015 (ref 1.005–1.030)
Urobilinogen, Ur: 0.2 mg/dL (ref 0.2–1.0)
pH, UA: 5 (ref 5.0–7.5)

## 2021-12-04 LAB — MICROSCOPIC EXAMINATION: WBC, UA: >30 /hpf — AB (ref 0–5)

## 2021-12-04 MED ORDER — MIRABEGRON ER 25 MG PO TB24: 25.0000 mg | ORAL_TABLET | Freq: Every day | ORAL | 0 refills | Status: DC

## 2021-12-09 ENCOUNTER — Telehealth: Payer: Self-pay

## 2021-12-09 NOTE — Telephone Encounter (Signed)
Patient took prescribed Rx for UTI.  Gave severe diarrhea.  Stopped taking antibiotic.  Wants to know what else she can take.  She has some antibiotics at home she can take.  Please advise.  Call back asap to [phone]   Thanks, Helene Kelp

## 2021-12-09 NOTE — Telephone Encounter (Signed)
MDX culture pending

## 2021-12-10 ENCOUNTER — Encounter: Payer: Self-pay | Admitting: Physician Assistant

## 2021-12-11 ENCOUNTER — Telehealth: Payer: Self-pay

## 2021-12-11 ENCOUNTER — Other Ambulatory Visit: Payer: Self-pay | Admitting: Physician Assistant

## 2021-12-11 MED ORDER — NITROFURANTOIN MONOHYD MACRO 100 MG PO CAPS: 100.0000 mg | ORAL_CAPSULE | Freq: Two times a day (BID) | ORAL | 0 refills | Status: AC

## 2021-12-11 NOTE — Telephone Encounter (Signed)
Made patient aware that her cx grew 3 different types of bacteria and she needs to start Rudy which covers all three. Macrobid was sent to her pharmacy now. Patient voiced understanding

## 2021-12-11 NOTE — Telephone Encounter (Signed)
-----   Message from Reynaldo Minium, [place] sent at 12/11/2021  4:45 PM EST ----- Please let pt know her cx grew 3 different types of bacteria and she needs to start [place] which covers all three. I will send to her pharmacy now. ----- Message ----- From: Jennette Bill Sent: 12/10/2021   9:28 AM EST To: Reynaldo Minium, PA-C

## 2021-12-27 ENCOUNTER — Ambulatory Visit: Payer: Medicare HMO | Admitting: Urology

## 2021-12-27 VITALS — BP 166/88 | HR 70 | Ht 64.0 in | Wt 198.0 lb

## 2021-12-27 DIAGNOSIS — Z87440 Personal history of urinary (tract) infections: Secondary | ICD-10-CM

## 2021-12-27 DIAGNOSIS — N819 Female genital prolapse, unspecified: Secondary | ICD-10-CM | POA: Diagnosis not present

## 2021-12-27 DIAGNOSIS — N3281 Overactive bladder: Secondary | ICD-10-CM

## 2021-12-27 LAB — URINALYSIS, ROUTINE W REFLEX MICROSCOPIC
Bilirubin, UA: NEGATIVE
Ketones, UA: NEGATIVE
Nitrite, UA: POSITIVE — AB
Specific Gravity, UA: 1.025 (ref 1.005–1.030)
Urobilinogen, Ur: 0.2 mg/dL (ref 0.2–1.0)
pH, UA: 5 (ref 5.0–7.5)

## 2021-12-27 LAB — MICROSCOPIC EXAMINATION: RBC, Urine: NONE SEEN /hpf (ref 0–2)

## 2021-12-27 NOTE — Progress Notes (Signed)
12/27/2021 10:23 AM   Sylvia Barnett May 14, 1945 [id number]  Referring provider: Manon Hilding, MD [place],  [name] [address]  Followup recurrent UTI and OAB   HPI: Sylvia Barnett is a 75yo here for followup for recurrent UTI and OAB. She has gotten 6 UTIs in the past year. UA today is concerning for infection. She was previously on Mirabegron '25mg'$  but stopped the medication due to cost. She then tried gemtesa this week which improved her urinary urgency and frequency. It has also improved her urinary incontinence. She has an issue with fecal incontinence. She uses 3 pads per day which are soaked.  She is taking macrobid '100mg'$  qhs. She has Glucose in her urine   PMH: Past Medical History:  Diagnosis Date   Anxiety    Since in her 20's   Arthritis 2013   Right hip   Cancer [facility]) 2004    Breast Lumpectomy   Complication of anesthesia    Has a small throat   Depression    Started when she was in her 20's   Hypercholesteremia 2012   Hypertension 2013   IBS (irritable bowel syndrome) 2018   per GI note   Incontinence-urinary 2012   Lobular carcinoma in situ (LCIS) of left breast 09/08/2008   Melanoma of back [facility]) 1994   Excision with clear margins   Microscopic colitis 2015   Resolved 2016    Surgical History: Past Surgical History:  Procedure Laterality Date   ABDOMINAL HYSTERECTOMY  2002   APPENDECTOMY  1949   BIOPSY  02/26/2018   Procedure: BIOPSY;  Surgeon: Rogene Houston, MD;  [facility];  Service: Endoscopy;;  gastric   BIOPSY BREAST  2010   Left   Bladder Tack  2002   BREAST LUMPECTOMY  2004   Left   BREAST LUMPECTOMY  2004   Left   BREAST LUMPECTOMY  2006   Left   BREAST LUMPECTOMY  2000   Left   BREAST LUMPECTOMY WITH RADIOACTIVE SEED AND SENTINEL LYMPH NODE BIOPSY Left 09/11/2017   Procedure: LEFT BREAST LUMPECTOMY X2 WITH RADIOACTIVE SEED AND LEFT AXILLARY SENTINEL LYMPH NODE BIOPSY;  Surgeon: Excell Seltzer, MD;  Location:  [place];  Service: General;  Laterality: Left;   BUNIONECTOMY  1994   Right   CATARACT EXTRACTION W/PHACO Left 08/18/2013   Procedure: CATARACT EXTRACTION PHACO AND INTRAOCULAR LENS PLACEMENT ([place]);  Surgeon: Tonny Branch, MD;  [facility];  Service: Ophthalmology;  Laterality: Left;  CDE: 8.62   COLONOSCOPY N/A 11/10/2013   Procedure: COLONOSCOPY;  Surgeon: Rogene Houston, MD;  [facility];  Service: Endoscopy;  Laterality: N/A;  240   COLONOSCOPY N/A 12/30/2018   Procedure: COLONOSCOPY;  Surgeon: Rogene Houston, MD;  [facility];  Service: Endoscopy;  Laterality: N/A;  12   ESOPHAGOGASTRODUODENOSCOPY N/A 02/26/2018   Procedure: ESOPHAGOGASTRODUODENOSCOPY (EGD);  Surgeon: Rogene Houston, MD;  [facility];  Service: Endoscopy;  Laterality: N/A;  [place]  2011   Catarct Right   EYE SURGERY  08-2011   Right Yag procedure   JOINT REPLACEMENT  08-2010   Left total knee   KNEE ARTHROSCOPY  2010   Right   KNEE ARTHROSCOPY  2003   Left   POLYPECTOMY  12/30/2018   Procedure: POLYPECTOMY;  Surgeon: Rogene Houston, MD;  [facility];  Service: Endoscopy;;  hepatic flexure,transverse colon  PORTACATH PLACEMENT N/A 09/11/2017   Procedure: INSERTION PORT-A-CATH;  Surgeon: Excell Seltzer, MD;  Location: [place];  Service: General;  Laterality: N/A;   TOTAL KNEE ARTHROPLASTY  11/03/2011   Procedure: TOTAL KNEE ARTHROPLASTY;  Surgeon: Gearlean Alf, MD;  [facility];  Service: Orthopedics;  Laterality: Right;   TUBAL LIGATION     WRIST SURGERY Bilateral 03-2010   WRIST SURGERY  2008   Right    Home Medications:  Allergies as of 12/27/2021       Reactions   Fiorinal-codeine #3 [butalbital-asa-caff-codeine] Rash   Lansoprazole Swelling   Latex Swelling, Other (See Comments), Itching, Rash   blisters   Butalbital-aspirin-caffeine Rash   Sulfa Antibiotics Rash   Doxycycline Diarrhea   Oxycodone-acetaminophen    Femara  [letrozole] Rash   Rash when tried in 1984   Sulfasalazine Rash        Medication List        Accurate as of December 27, 2021 10:23 AM. If you have any questions, ask your nurse or doctor.          atorvastatin 10 MG tablet Commonly known as: LIPITOR Take 10 mg by mouth at bedtime.   calcium-vitamin D 500-200 MG-UNIT Tabs tablet Commonly known as: OSCAL WITH D Take by mouth.   CULTURELLE DIGESTIVE DAILY PRO PO Take by mouth.   doxycycline 100 MG capsule Commonly known as: VIBRAMYCIN Take 1 capsule (100 mg total) by mouth every 12 (twelve) hours.   escitalopram 5 MG tablet Commonly known as: LEXAPRO Take 5 mg by mouth at bedtime.   hydroxypropyl methylcellulose / hypromellose 2.5 % ophthalmic solution Commonly known as: ISOPTO TEARS / GONIOVISC Place 1 drop into both eyes 3 (three) times daily as needed for dry eyes.   IMODIUM PO Take 2 mg by mouth daily.   ketoconazole 2 % cream Commonly known as: NIZORAL   Menthol (Topical Analgesic) 5 % Pads Apply 1 patch topically daily as needed (pain).   mirabegron ER 25 MG Tb24 tablet Commonly known as: MYRBETRIQ Take 1 tablet (25 mg total) by mouth daily.   vitamin C 1000 MG tablet Take 1 tablet (1,000 mg total) by mouth daily.        Allergies:  Allergies  Allergen Reactions   Fiorinal-Codeine #3 [Butalbital-Asa-Caff-Codeine] Rash   Lansoprazole Swelling   Latex Swelling, Other (See Comments), Itching and Rash    blisters   Butalbital-Aspirin-Caffeine Rash   Sulfa Antibiotics Rash   Doxycycline Diarrhea   Oxycodone-Acetaminophen    Femara [Letrozole] Rash    Rash when tried in 1984   Sulfasalazine Rash    Family History: Family History  Problem Relation Age of Onset   Breast cancer Sister 3   Lymphoma Maternal Aunt     Social History:  reports that she has never smoked. She has never used smokeless tobacco. She reports that she does not drink alcohol and does not use drugs.  ROS: All  other review of systems were reviewed and are negative except what is noted above in HPI  Physical Exam: BP (!) 166/88   Pulse 70   Ht '5\' 4"'$  (1.626 m)   Wt 198 lb (89.8 kg)   BMI 33.99 kg/m   Constitutional:  Alert and oriented, No acute distress. HEENT: [place] AT, moist mucus membranes.  Trachea midline, no masses. Cardiovascular: No clubbing, cyanosis, or edema. Respiratory: Normal respiratory effort, no increased work of breathing. GI: Abdomen is soft, nontender, nondistended, no abdominal masses GU: No CVA  tenderness.  Lymph: No cervical or inguinal lymphadenopathy. Skin: No rashes, bruises or suspicious lesions. Neurologic: Grossly intact, no focal deficits, moving all 4 extremities. Psychiatric: Normal mood and affect.  Laboratory Data: Lab Results  Component Value Date   WBC 5.8 10/25/2018   HGB 13.3 10/25/2018   HCT 39.3 10/25/2018   MCV 92.3 10/25/2018   PLT 155 10/25/2018    Lab Results  Component Value Date   CREATININE 0.87 10/25/2018    No results found for: "PSA"  No results found for: "TESTOSTERONE"  No results found for: "HGBA1C"  Urinalysis    Component Value Date/Time   COLORURINE YELLOW 12/03/2018 1731   APPEARANCEUR Cloudy (A) 12/03/2021 1130   LABSPEC 1.018 12/03/2018 1731   PHURINE 5.0 12/03/2018 1731   GLUCOSEU Negative 12/03/2021 1130   HGBUR SMALL (A) 12/03/2018 1731   BILIRUBINUR Negative 12/03/2021 1130   KETONESUR NEGATIVE 12/03/2018 1731   PROTEINUR Negative 12/03/2021 1130   PROTEINUR NEGATIVE 12/03/2018 1731   UROBILINOGEN negative (A) 06/03/2019 1106   UROBILINOGEN 0.2 10/28/2011 1315   NITRITE Negative 12/03/2021 1130   NITRITE POSITIVE (A) 12/03/2018 1731   LEUKOCYTESUR 2+ (A) 12/03/2021 1130   LEUKOCYTESUR MODERATE (A) 12/03/2018 1731    Lab Results  Component Value Date   LABMICR See below: 12/03/2021   WBCUA >30 (A) 12/03/2021   LABEPIT 0-10 12/03/2021   BACTERIA Few 12/03/2021    Pertinent Imaging:  No results  found for this or any previous visit.  No results found for this or any previous visit.  No results found for this or any previous visit.  No results found for this or any previous visit.  Results for orders placed during the hospital encounter of 12/14/17  US RENAL  Narrative CLINICAL DATA:  Possible cyst in the right ovary  EXAM: RENAL / URINARY TRACT ULTRASOUND COMPLETE  COMPARISON:  Limited views of the kidneys from a cardiac research MRI dated October 15, 2017.  FINDINGS: Right Kidney:  Renal measurements: 10.7 x 5.8 x 5 cm = volume: 162 mL. The renal cortical echotexture remains lower than that of the liver. There is moderate hydronephrosis.  Left Kidney:  Renal measurements: 12.3 x 5.9 x 5.4 cm = volume: 204 mL. There is no left-sided hydronephrosis. There is a large upper pole cyst measuring 7 x 6.1 x 7.3 cm. No internal echoes are observed. The cortical echotexture is similar to that on the right.  Bladder:  The partially distended urinary bladder is normal. There is a moderate-sized postvoid residual volume. No ureteral jets were observed.  IMPRESSION: Moderate right-sided hydronephrosis.  No left-sided hydronephrosis.  Large simple appearing exophytic left upper pole cyst measuring 7.3 cm in greatest dimension.  Moderate-sized postvoid residual urinary bladder volume.   Electronically Signed By: David  Martinique M.D. On: 12/15/2017 11:36  No valid procedures specified. No results found for this or any previous visit.  No results found for this or any previous visit.   Assessment & Plan:    1. History of recurrent UTIs -Urine for culture, will call with results  - Urinalysis, Routine w reflex microscopic  2. OAB (overactive bladder) -We will restart Gemtesa '75mg'$  daily   No follow-ups on file.  Nicolette Bang, MD  [facility] [place]

## 2021-12-31 ENCOUNTER — Telehealth: Payer: Self-pay

## 2021-12-31 LAB — URINE CULTURE

## 2021-12-31 MED ORDER — AMOXICILLIN-POT CLAVULANATE 875-125 MG PO TABS: 1.0000 | ORAL_TABLET | Freq: Two times a day (BID) | ORAL | 0 refills | Status: DC

## 2021-12-31 NOTE — Telephone Encounter (Signed)
-----   Message from Cleon Gustin, MD sent at 12/31/2021  3:47 PM EST ----- Bactrim DS ----- Message ----- From: Dorisann Frames, RN Sent: 12/31/2021   3:44 PM EST To: Cleon Gustin, MD  No treated started

## 2021-12-31 NOTE — Telephone Encounter (Signed)
Patient has allergy to sulfa, reviewed with Dr. Alyson Ingles- order for Augmentin '875mg'$  po BID for 7 days to be sent to pharmacy  Patient called and notified- voiced understanding

## 2022-01-02 ENCOUNTER — Encounter: Payer: Self-pay | Admitting: Urology

## 2022-01-02 NOTE — Patient Instructions (Signed)

## 2022-02-03 ENCOUNTER — Ambulatory Visit: Payer: Medicare HMO | Admitting: Urology

## 2022-02-03 ENCOUNTER — Encounter: Payer: Self-pay | Admitting: Urology

## 2022-02-03 VITALS — BP 155/91 | HR 81

## 2022-02-03 DIAGNOSIS — N3281 Overactive bladder: Secondary | ICD-10-CM

## 2022-02-03 DIAGNOSIS — Z87440 Personal history of urinary (tract) infections: Secondary | ICD-10-CM

## 2022-02-03 LAB — URINALYSIS, ROUTINE W REFLEX MICROSCOPIC
Bilirubin, UA: NEGATIVE
Glucose, UA: NEGATIVE
Ketones, UA: NEGATIVE
Nitrite, UA: POSITIVE — AB
RBC, UA: NEGATIVE
Specific Gravity, UA: 1.02 (ref 1.005–1.030)
Urobilinogen, Ur: 0.2 mg/dL (ref 0.2–1.0)
pH, UA: 5.5 (ref 5.0–7.5)

## 2022-02-03 LAB — MICROSCOPIC EXAMINATION: WBC, UA: >30 /hpf — AB (ref 0–5)

## 2022-02-03 MED ORDER — FLUCONAZOLE 150 MG PO TABS: 150.0000 mg | ORAL_TABLET | Freq: Every day | ORAL | 0 refills | Status: DC

## 2022-02-03 NOTE — Patient Instructions (Signed)

## 2022-02-03 NOTE — Progress Notes (Signed)
02/03/2022 10:07 AM   Sylvia Barnett Apr 27, 1945 [id number]  Referring provider: Manon Hilding, MD [place],  [place] [address]  Followup recurrent UTI and OAB   HPI: Ms Sylvia Barnett is a 77yo here for followup for frequent UTI and OAB. Sh ehas been diagnosed with 2 UTIs in the past 3 months. UA today is concerning for infection. She has mild dysuria for the past 7 days. She has worsening urinary urgency and frequency for the past week. No gross hematuria. She does timed voiding which slightly improves her OAB symptoms. She had less urinary frequency and urgency when she was on mirabegron '25mg'$  daily   PMH: Past Medical History:  Diagnosis Date   Anxiety    Since in her 20's   Arthritis 2013   Right hip   Cancer ([place]) 2004    Breast Lumpectomy   Complication of anesthesia    Has a small throat   Depression    Started when she was in her 20's   Hypercholesteremia 2012   Hypertension 2013   IBS (irritable bowel syndrome) 2018   per GI note   Incontinence-urinary 2012   Lobular carcinoma in situ (LCIS) of left breast 09/08/2008   Melanoma of back [facility]) 1994   Excision with clear margins   Microscopic colitis 2015   Resolved 2016    Surgical History: Past Surgical History:  Procedure Laterality Date   ABDOMINAL HYSTERECTOMY  2002   APPENDECTOMY  1949   BIOPSY  02/26/2018   Procedure: BIOPSY;  Surgeon: Rogene Houston, MD;  [facility];  Service: Endoscopy;;  gastric   BIOPSY BREAST  2010   Left   Bladder Tack  2002   BREAST LUMPECTOMY  2004   Left   BREAST LUMPECTOMY  2004   Left   BREAST LUMPECTOMY  2006   Left   BREAST LUMPECTOMY  2000   Left   BREAST LUMPECTOMY WITH RADIOACTIVE SEED AND SENTINEL LYMPH NODE BIOPSY Left 09/11/2017   Procedure: LEFT BREAST LUMPECTOMY X2 WITH RADIOACTIVE SEED AND LEFT AXILLARY SENTINEL LYMPH NODE BIOPSY;  Surgeon: Excell Seltzer, MD;  Location: [place];  Service: General;  Laterality: Left;   BUNIONECTOMY   1994   Right   CATARACT EXTRACTION W/PHACO Left 08/18/2013   Procedure: CATARACT EXTRACTION PHACO AND INTRAOCULAR LENS PLACEMENT ([place]);  Surgeon: Tonny Branch, MD;  [facility];  Service: Ophthalmology;  Laterality: Left;  CDE: 8.62   COLONOSCOPY N/A 11/10/2013   Procedure: COLONOSCOPY;  Surgeon: Rogene Houston, MD;  [facility];  Service: Endoscopy;  Laterality: N/A;  240   COLONOSCOPY N/A 12/30/2018   Procedure: COLONOSCOPY;  Surgeon: Rogene Houston, MD;  [facility];  Service: Endoscopy;  Laterality: N/A;  12   ESOPHAGOGASTRODUODENOSCOPY N/A 02/26/2018   Procedure: ESOPHAGOGASTRODUODENOSCOPY (EGD);  Surgeon: Rogene Houston, MD;  [facility];  Service: Endoscopy;  Laterality: N/A;  [place]   Catarct Right   EYE SURGERY  08-2011   Right Yag procedure   JOINT REPLACEMENT  08-2010   Left total knee   KNEE ARTHROSCOPY  2010   Right   KNEE ARTHROSCOPY  2003   Left   POLYPECTOMY  12/30/2018   Procedure: POLYPECTOMY;  Surgeon: Rogene Houston, MD;  [facility];  Service: Endoscopy;;  hepatic flexure,transverse colon     PORTACATH PLACEMENT N/A 09/11/2017   Procedure: INSERTION PORT-A-CATH;  Surgeon: Excell Seltzer,  Marland Kitchen, MD;  Location: [place];  Service: General;  Laterality: N/A;   TOTAL KNEE ARTHROPLASTY  11/03/2011   Procedure: TOTAL KNEE ARTHROPLASTY;  Surgeon: Gearlean Alf, MD;  [facility];  Service: Orthopedics;  Laterality: Right;   TUBAL LIGATION     WRIST SURGERY Bilateral 03-2010   WRIST SURGERY  2008   Right    Home Medications:  Allergies as of 02/03/2022       Reactions   Fiorinal-codeine #3 [butalbital-asa-caff-codeine] Rash   Lansoprazole Swelling   Latex Swelling, Other (See Comments), Itching, Rash   blisters   Butalbital-aspirin-caffeine Rash   Sulfa Antibiotics Rash   Doxycycline Diarrhea   Oxycodone-acetaminophen    Femara [letrozole] Rash   Rash when tried in 1984   Sulfasalazine  Rash        Medication List        Accurate as of February 03, 2022 10:07 AM. If you have any questions, ask your nurse or doctor.          amoxicillin-clavulanate 875-125 MG tablet Commonly known as: AUGMENTIN Take 1 tablet by mouth every 12 (twelve) hours.   atorvastatin 10 MG tablet Commonly known as: LIPITOR Take 10 mg by mouth at bedtime.   calcium-vitamin D 500-200 MG-UNIT Tabs tablet Commonly known as: OSCAL WITH D Take by mouth.   CULTURELLE DIGESTIVE DAILY PRO PO Take by mouth.   escitalopram 5 MG tablet Commonly known as: LEXAPRO Take 5 mg by mouth at bedtime.   hydroxypropyl methylcellulose / hypromellose 2.5 % ophthalmic solution Commonly known as: ISOPTO TEARS / GONIOVISC Place 1 drop into both eyes 3 (three) times daily as needed for dry eyes.   IMODIUM PO Take 2 mg by mouth daily.   ketoconazole 2 % cream Commonly known as: NIZORAL   Menthol (Topical Analgesic) 5 % Pads Apply 1 patch topically daily as needed (pain).   mirabegron ER 25 MG Tb24 tablet Commonly known as: MYRBETRIQ Take 1 tablet (25 mg total) by mouth daily.   vitamin C 1000 MG tablet Take 1 tablet (1,000 mg total) by mouth daily.        Allergies:  Allergies  Allergen Reactions   Fiorinal-Codeine #3 [Butalbital-Asa-Caff-Codeine] Rash   Lansoprazole Swelling   Latex Swelling, Other (See Comments), Itching and Rash    blisters   Butalbital-Aspirin-Caffeine Rash   Sulfa Antibiotics Rash   Doxycycline Diarrhea   Oxycodone-Acetaminophen    Femara [Letrozole] Rash    Rash when tried in 1984   Sulfasalazine Rash    Family History: Family History  Problem Relation Age of Onset   Breast cancer Sister 20   Lymphoma Maternal Aunt     Social History:  reports that she has never smoked. She has never used smokeless tobacco. She reports that she does not drink alcohol and does not use drugs.  ROS: All other review of systems were reviewed and are negative except what  is noted above in HPI  Physical Exam: BP (!) 155/91   Pulse 81   Constitutional:  Alert and oriented, No acute distress. HEENT: [place] AT, moist mucus membranes.  Trachea midline, no masses. Cardiovascular: No clubbing, cyanosis, or edema. Respiratory: Normal respiratory effort, no increased work of breathing. GI: Abdomen is soft, nontender, nondistended, no abdominal masses GU: No CVA tenderness.  Lymph: No cervical or inguinal lymphadenopathy. Skin: No rashes, bruises or suspicious lesions. Neurologic: Grossly intact, no focal deficits, moving all 4 extremities. Psychiatric: Normal mood and affect.  Laboratory Data: Lab  Results  Component Value Date   WBC 5.8 10/25/2018   HGB 13.3 10/25/2018   HCT 39.3 10/25/2018   MCV 92.3 10/25/2018   PLT 155 10/25/2018    Lab Results  Component Value Date   CREATININE 0.87 10/25/2018    No results found for: "PSA"  No results found for: "TESTOSTERONE"  No results found for: "HGBA1C"  Urinalysis    Component Value Date/Time   COLORURINE YELLOW 12/03/2018 1731   APPEARANCEUR Clear 12/27/2021 0943   LABSPEC 1.018 12/03/2018 1731   PHURINE 5.0 12/03/2018 1731   GLUCOSEU Trace (A) 12/27/2021 0943   HGBUR SMALL (A) 12/03/2018 1731   BILIRUBINUR Negative 12/27/2021 0943   KETONESUR NEGATIVE 12/03/2018 1731   PROTEINUR 1+ (A) 12/27/2021 0943   PROTEINUR NEGATIVE 12/03/2018 1731   UROBILINOGEN negative (A) 06/03/2019 1106   UROBILINOGEN 0.2 10/28/2011 1315   NITRITE Positive (A) 12/27/2021 0943   NITRITE POSITIVE (A) 12/03/2018 1731   LEUKOCYTESUR 1+ (A) 12/27/2021 0943   LEUKOCYTESUR MODERATE (A) 12/03/2018 1731    Lab Results  Component Value Date   LABMICR See below: 12/27/2021   WBCUA 6-10 (A) 12/27/2021   LABEPIT 0-10 12/27/2021   BACTERIA Few 12/27/2021    Pertinent Imaging:  No results found for this or any previous visit.  No results found for this or any previous visit.  No results found for this or any  previous visit.  No results found for this or any previous visit.  Results for orders placed during the hospital encounter of 12/14/17  US RENAL  Narrative CLINICAL DATA:  Possible cyst in the right ovary  EXAM: RENAL / URINARY TRACT ULTRASOUND COMPLETE  COMPARISON:  Limited views of the kidneys from a cardiac research MRI dated October 15, 2017.  FINDINGS: Right Kidney:  Renal measurements: 10.7 x 5.8 x 5 cm = volume: 162 mL. The renal cortical echotexture remains lower than that of the liver. There is moderate hydronephrosis.  Left Kidney:  Renal measurements: 12.3 x 5.9 x 5.4 cm = volume: 204 mL. There is no left-sided hydronephrosis. There is a large upper pole cyst measuring 7 x 6.1 x 7.3 cm. No internal echoes are observed. The cortical echotexture is similar to that on the right.  Bladder:  The partially distended urinary bladder is normal. There is a moderate-sized postvoid residual volume. No ureteral jets were observed.  IMPRESSION: Moderate right-sided hydronephrosis.  No left-sided hydronephrosis.  Large simple appearing exophytic left upper pole cyst measuring 7.3 cm in greatest dimension.  Moderate-sized postvoid residual urinary bladder volume.   Electronically Signed By: David  Martinique M.D. On: 12/15/2017 11:36  No valid procedures specified. No results found for this or any previous visit.  No results found for this or any previous visit.   Assessment & Plan:    1. History of recurrent UTIs -macrobid '100mg'$  QOD Keflex '500mg'$  BID for 7 days - Urinalysis, Routine w reflex microscopic  2. OAB (overactive bladder) -restart mirabegron '25mg'$  daily - Urinalysis, Routine w reflex microscopic   No follow-ups on file.  Nicolette Bang, MD  [facility] [place]

## 2022-02-05 ENCOUNTER — Telehealth: Payer: Self-pay

## 2022-02-05 DIAGNOSIS — Z87440 Personal history of urinary (tract) infections: Secondary | ICD-10-CM

## 2022-02-05 LAB — URINE CULTURE

## 2022-02-05 MED ORDER — AMOXICILLIN-POT CLAVULANATE 875-125 MG PO TABS: 1.0000 | ORAL_TABLET | Freq: Two times a day (BID) | ORAL | 0 refills | Status: DC

## 2022-02-05 NOTE — Telephone Encounter (Signed)
Patient called and made aware of positive urine culture and antibiotic sent to pharmacy. 

## 2022-02-07 ENCOUNTER — Ambulatory Visit: Payer: Medicare HMO | Admitting: Urology

## 2022-03-03 ENCOUNTER — Telehealth: Payer: Self-pay

## 2022-03-03 NOTE — Telephone Encounter (Signed)
Pt called requesting a prescription for bra and breast prosthesis be sent to [facility] in [place], [place]. They are closed on Mondays. Advised pt I would call tomorrow to obtain their fax number and send an order for Bra and prosthesis. She verbalized thanks and understanding.

## 2022-03-05 DIAGNOSIS — Z853 Personal history of malignant neoplasm of breast: Secondary | ICD-10-CM | POA: Diagnosis not present

## 2022-03-05 DIAGNOSIS — R928 Other abnormal and inconclusive findings on diagnostic imaging of breast: Secondary | ICD-10-CM | POA: Diagnosis not present

## 2022-03-05 DIAGNOSIS — Z09 Encounter for follow-up examination after completed treatment for conditions other than malignant neoplasm: Secondary | ICD-10-CM | POA: Diagnosis not present

## 2022-03-13 ENCOUNTER — Telehealth: Payer: Self-pay | Admitting: *Deleted

## 2022-03-13 NOTE — Telephone Encounter (Signed)
Received call from pt requesting prescription for post lumpectomy bra and prosthesis be [facility]ed [facility] [place].  RN successfully faxed order to [phone].

## 2022-03-20 DIAGNOSIS — C50912 Malignant neoplasm of unspecified site of left female breast: Secondary | ICD-10-CM | POA: Diagnosis not present

## 2022-03-24 DIAGNOSIS — M47816 Spondylosis without myelopathy or radiculopathy, lumbar region: Secondary | ICD-10-CM | POA: Diagnosis not present

## 2022-04-15 DIAGNOSIS — M47816 Spondylosis without myelopathy or radiculopathy, lumbar region: Secondary | ICD-10-CM | POA: Diagnosis not present

## 2022-04-24 DIAGNOSIS — N183 Chronic kidney disease, stage 3 unspecified: Secondary | ICD-10-CM | POA: Diagnosis not present

## 2022-04-24 DIAGNOSIS — E7849 Other hyperlipidemia: Secondary | ICD-10-CM | POA: Diagnosis not present

## 2022-04-24 DIAGNOSIS — R5383 Other fatigue: Secondary | ICD-10-CM | POA: Diagnosis not present

## 2022-04-24 DIAGNOSIS — E7801 Familial hypercholesterolemia: Secondary | ICD-10-CM | POA: Diagnosis not present

## 2022-04-24 DIAGNOSIS — E876 Hypokalemia: Secondary | ICD-10-CM | POA: Diagnosis not present

## 2022-04-24 DIAGNOSIS — I10 Essential (primary) hypertension: Secondary | ICD-10-CM | POA: Diagnosis not present

## 2022-05-01 DIAGNOSIS — M542 Cervicalgia: Secondary | ICD-10-CM | POA: Diagnosis not present

## 2022-05-01 DIAGNOSIS — N183 Chronic kidney disease, stage 3 unspecified: Secondary | ICD-10-CM | POA: Diagnosis not present

## 2022-05-01 DIAGNOSIS — M8580 Other specified disorders of bone density and structure, unspecified site: Secondary | ICD-10-CM | POA: Diagnosis not present

## 2022-05-01 DIAGNOSIS — E7849 Other hyperlipidemia: Secondary | ICD-10-CM | POA: Diagnosis not present

## 2022-05-01 DIAGNOSIS — F411 Generalized anxiety disorder: Secondary | ICD-10-CM | POA: Diagnosis not present

## 2022-05-01 DIAGNOSIS — I10 Essential (primary) hypertension: Secondary | ICD-10-CM | POA: Diagnosis not present

## 2022-05-01 DIAGNOSIS — F331 Major depressive disorder, recurrent, moderate: Secondary | ICD-10-CM | POA: Diagnosis not present

## 2022-05-01 DIAGNOSIS — D0500 Lobular carcinoma in situ of unspecified breast: Secondary | ICD-10-CM | POA: Diagnosis not present

## 2022-05-01 DIAGNOSIS — M545 Low back pain, unspecified: Secondary | ICD-10-CM | POA: Diagnosis not present

## 2022-05-09 ENCOUNTER — Ambulatory Visit: Payer: Medicare HMO | Admitting: Urology

## 2022-05-09 VITALS — BP 133/82 | HR 82

## 2022-05-09 DIAGNOSIS — N3281 Overactive bladder: Secondary | ICD-10-CM

## 2022-05-09 DIAGNOSIS — Z87440 Personal history of urinary (tract) infections: Secondary | ICD-10-CM

## 2022-05-09 LAB — URINALYSIS, ROUTINE W REFLEX MICROSCOPIC
Bilirubin, UA: NEGATIVE
Glucose, UA: NEGATIVE
Ketones, UA: NEGATIVE
Nitrite, UA: POSITIVE — AB
Specific Gravity, UA: 1.015 (ref 1.005–1.030)
Urobilinogen, Ur: 0.2 mg/dL (ref 0.2–1.0)
pH, UA: 5 (ref 5.0–7.5)

## 2022-05-09 LAB — MICROSCOPIC EXAMINATION
Epithelial Cells (non renal): >10 /hpf — AB (ref 0–10)
WBC, UA: >30 /hpf — AB (ref 0–5)

## 2022-05-09 MED ORDER — MIRABEGRON ER 50 MG PO TB24: 50.0000 mg | ORAL_TABLET | Freq: Every day | ORAL | 11 refills | Status: DC

## 2022-05-09 MED ORDER — NITROFURANTOIN MONOHYD MACRO 100 MG PO CAPS: 100.0000 mg | ORAL_CAPSULE | ORAL | 11 refills | Status: DC

## 2022-05-09 NOTE — Progress Notes (Unsigned)
05/09/2022 9:45 AM   Kenyon Ana 03/05/1945 [id number]  Referring provider: Estanislado Pandy, MD [address] [place],  [place] [address]  Followup OAB and recurrent UTI   HPI: Sylvia Barnett is a 76yo here for followup for OAB and recurrent UTI. NO UTIs since last visit. No dysuria or hematuria. She has intermittent urinary urgency which is improved with mirabegron. No other complaints today   PMH: Past Medical History:  Diagnosis Date   Anxiety    Since in her 20's   Arthritis 2013   Right hip   Cancer ([facility]) 2004    Breast Lumpectomy   Complication of anesthesia    Has a small throat   Depression    Started when she was in her 20's   Hypercholesteremia 2012   Hypertension 2013   IBS (irritable bowel syndrome) 2018   per GI note   Incontinence-urinary 2012   Lobular carcinoma in situ (LCIS) of left breast 09/08/2008   Melanoma of back [facility]) 1994   Excision with clear margins   Microscopic colitis 2015   Resolved 2016    Surgical History: Past Surgical History:  Procedure Laterality Date   ABDOMINAL HYSTERECTOMY  2002   APPENDECTOMY  1949   BIOPSY  02/26/2018   Procedure: BIOPSY;  Surgeon: Malissa Hippo, MD;  [facility];  Service: Endoscopy;;  gastric   BIOPSY BREAST  2010   Left   Bladder Tack  2002   BREAST LUMPECTOMY  2004   Left   BREAST LUMPECTOMY  2004   Left   BREAST LUMPECTOMY  2006   Left   BREAST LUMPECTOMY  2000   Left   BREAST LUMPECTOMY WITH RADIOACTIVE SEED AND SENTINEL LYMPH NODE BIOPSY Left 09/11/2017   Procedure: LEFT BREAST LUMPECTOMY X2 WITH RADIOACTIVE SEED AND LEFT AXILLARY SENTINEL LYMPH NODE BIOPSY;  Surgeon: Glenna Fellows, MD;  [facility];  Service: General;  Laterality: Left;   BUNIONECTOMY  1994   Right   CATARACT EXTRACTION W/PHACO Left 08/18/2013   Procedure: CATARACT EXTRACTION PHACO AND INTRAOCULAR LENS PLACEMENT ([facility]);  Surgeon: Gemma Payor, MD;  [facility];  Service: Ophthalmology;   Laterality: Left;  CDE: 8.62   COLONOSCOPY N/A 11/10/2013   Procedure: COLONOSCOPY;  Surgeon: Malissa Hippo, MD;  [facility];  Service: Endoscopy;  Laterality: N/A;  240   COLONOSCOPY N/A 12/30/2018   Procedure: COLONOSCOPY;  Surgeon: Malissa Hippo, MD;  [facility];  Service: Endoscopy;  Laterality: N/A;  12   ESOPHAGOGASTRODUODENOSCOPY N/A 02/26/2018   Procedure: ESOPHAGOGASTRODUODENOSCOPY (EGD);  Surgeon: Malissa Hippo, MD;  [facility];  Service: Endoscopy;  Laterality: N/A;  1055   EYE SURGERY  2011   Catarct Right   EYE SURGERY  08-2011   Right Yag procedure   JOINT REPLACEMENT  08-2010   Left total knee   KNEE ARTHROSCOPY  2010   Right   KNEE ARTHROSCOPY  2003   Left   POLYPECTOMY  12/30/2018   Procedure: POLYPECTOMY;  Surgeon: Malissa Hippo, MD;  [facility];  Service: Endoscopy;;  hepatic flexure,transverse colon     PORTACATH PLACEMENT N/A 09/11/2017   Procedure: INSERTION PORT-A-CATH;  Surgeon: Glenna Fellows, MD;  [facility];  Service: General;  Laterality: N/A;   TOTAL KNEE ARTHROPLASTY  11/03/2011   Procedure: TOTAL KNEE ARTHROPLASTY;  Surgeon: Loanne Drilling, MD;  [facility];  Service: Orthopedics;  Laterality: Right;   TUBAL LIGATION     WRIST SURGERY Bilateral 03-2010   WRIST SURGERY  2008   Right    Home Medications:  Allergies as of 05/09/2022       Reactions   Fiorinal-codeine #3 [butalbital-asa-caff-codeine] Rash   Lansoprazole Swelling   Latex Swelling, Other (See Comments), Itching, Rash   blisters   Butalbital-aspirin-caffeine Rash   Sulfa Antibiotics Rash   Doxycycline Diarrhea   Oxycodone-acetaminophen    Femara [letrozole] Rash   Rash when tried in 1984   Sulfasalazine Rash        Medication List        Accurate as of May 09, 2022  9:45 AM. If you have any questions, ask your nurse or doctor.          amoxicillin-clavulanate 875-125 MG tablet Commonly known  as: AUGMENTIN Take 1 tablet by mouth every 12 (twelve) hours.   amoxicillin-clavulanate 875-125 MG tablet Commonly known as: AUGMENTIN Take 1 tablet by mouth 2 (two) times daily.   atorvastatin 10 MG tablet Commonly known as: LIPITOR Take 10 mg by mouth at bedtime.   calcium-vitamin D 500-200 MG-UNIT Tabs tablet Commonly known as: OSCAL WITH D Take by mouth.   CULTURELLE DIGESTIVE DAILY PRO PO Take by mouth.   escitalopram 5 MG tablet Commonly known as: LEXAPRO Take 5 mg by mouth at bedtime.   fluconazole 150 MG tablet Commonly known as: DIFLUCAN Take 1 tablet (150 mg total) by mouth daily.   hydroxypropyl methylcellulose / hypromellose 2.5 % ophthalmic solution Commonly known as: ISOPTO TEARS / GONIOVISC Place 1 drop into both eyes 3 (three) times daily as needed for dry eyes.   IMODIUM PO Take 2 mg by mouth daily.   ketoconazole 2 % cream Commonly known as: NIZORAL   Menthol (Topical Analgesic) 5 % Pads Apply 1 patch topically daily as needed (pain).   mirabegron ER 25 MG Tb24 tablet Commonly known as: MYRBETRIQ Take 1 tablet (25 mg total) by mouth daily.   vitamin C 1000 MG tablet Take 1 tablet (1,000 mg total) by mouth daily.        Allergies:  Allergies  Allergen Reactions   Fiorinal-Codeine #3 [Butalbital-Asa-Caff-Codeine] Rash   Lansoprazole Swelling   Latex Swelling, Other (See Comments), Itching and Rash    blisters   Butalbital-Aspirin-Caffeine Rash   Sulfa Antibiotics Rash   Doxycycline Diarrhea   Oxycodone-Acetaminophen    Femara [Letrozole] Rash    Rash when tried in 1984   Sulfasalazine Rash    Family History: Family History  Problem Relation Age of Onset   Breast cancer Sister 71   Lymphoma Maternal Aunt     Social History:  reports that she has never smoked. She has never used smokeless tobacco. She reports that she does not drink alcohol and does not use drugs.  ROS: All other review of systems were reviewed and are negative  except what is noted above in HPI  Physical Exam: BP 133/82   Pulse 82   Constitutional:  Alert and oriented, No acute distress. HEENT: [place] AT, moist mucus membranes.  Trachea midline, no masses. Cardiovascular: No clubbing, cyanosis, or edema. Respiratory: Normal respiratory effort, no increased work of breathing. GI: Abdomen is soft, nontender, nondistended, no abdominal masses GU: No CVA tenderness.  Lymph: No cervical or inguinal lymphadenopathy. Skin: No rashes, bruises or suspicious lesions. Neurologic: Grossly intact, no focal deficits, moving all 4 extremities. Psychiatric: Normal mood and affect.  Laboratory Data: Lab Results  Component Value Date   WBC 5.8 10/25/2018   HGB 13.3 10/25/2018   HCT 39.3 10/25/2018   MCV 92.3 10/25/2018   PLT 155 10/25/2018    Lab Results  Component Value Date   CREATININE 0.87 10/25/2018    No results found for: "PSA"  No results found for: "TESTOSTERONE"  No results found for: "HGBA1C"  Urinalysis    Component Value Date/Time   COLORURINE YELLOW 12/03/2018 1731   APPEARANCEUR Cloudy (A) 02/03/2022 0942   LABSPEC 1.018 12/03/2018 1731   PHURINE 5.0 12/03/2018 1731   GLUCOSEU Negative 02/03/2022 0942   HGBUR SMALL (A) 12/03/2018 1731   BILIRUBINUR Negative 02/03/2022 0942   KETONESUR NEGATIVE 12/03/2018 1731   PROTEINUR 1+ (A) 02/03/2022 0942   PROTEINUR NEGATIVE 12/03/2018 1731   UROBILINOGEN negative (A) 06/03/2019 1106   UROBILINOGEN 0.2 10/28/2011 1315   NITRITE Positive (A) 02/03/2022 0942   NITRITE POSITIVE (A) 12/03/2018 1731   LEUKOCYTESUR 1+ (A) 02/03/2022 0942   LEUKOCYTESUR MODERATE (A) 12/03/2018 1731    Lab Results  Component Value Date   LABMICR See below: 02/03/2022   WBCUA >30 (A) 02/03/2022   LABEPIT 0-10 02/03/2022   BACTERIA Many (A) 02/03/2022    Pertinent Imaging:  No results found for this or any previous visit.  No results found for this or any previous visit.  No results found for  this or any previous visit.  No results found for this or any previous visit.  Results for orders placed during the hospital encounter of 12/14/17  US RENAL  Narrative CLINICAL DATA:  Possible cyst in the right ovary  EXAM: RENAL / URINARY TRACT ULTRASOUND COMPLETE  COMPARISON:  Limited views of the kidneys from a cardiac research MRI dated October 15, 2017.  FINDINGS: Right Kidney:  Renal measurements: 10.7 x 5.8 x 5 cm = volume: 162 mL. The renal cortical echotexture remains lower than that of the liver. There is moderate hydronephrosis.  Left Kidney:  Renal measurements: 12.3 x 5.9 x 5.4 cm = volume: 204 mL. There is no left-sided hydronephrosis. There is a large upper pole cyst measuring 7 x 6.1 x 7.3 cm. No internal echoes are observed. The cortical echotexture is similar to that on the right.  Bladder:  The partially distended urinary bladder is normal. There is a moderate-sized postvoid residual volume. No ureteral jets were observed.  IMPRESSION: Moderate right-sided hydronephrosis.  No left-sided hydronephrosis.  Large simple appearing exophytic left upper pole cyst measuring 7.3 cm in greatest dimension.  Moderate-sized postvoid residual urinary bladder volume.   Electronically Signed By: David  [place] M.D. On: 12/15/2017 11:36  No valid procedures specified. No results found for this or any previous visit.  No results found for this or any previous visit.   Assessment & Plan:    1. History of recurrent UTIs -continue macrobid 100mg  QOD - Urinalysis, Routine w reflex microscopic  2. OAB (overactive bladder) -increase mirabegron to 50mg  daily   No follow-ups on file.  Sylvia Aye, MD  [facility] [place]

## 2022-05-09 NOTE — Patient Instructions (Signed)

## 2022-05-13 ENCOUNTER — Encounter: Payer: Self-pay | Admitting: Urology

## 2022-06-23 DIAGNOSIS — M47816 Spondylosis without myelopathy or radiculopathy, lumbar region: Secondary | ICD-10-CM | POA: Diagnosis not present

## 2022-06-26 DIAGNOSIS — E319 Polyglandular dysfunction, unspecified: Secondary | ICD-10-CM | POA: Diagnosis not present

## 2022-06-26 DIAGNOSIS — N3000 Acute cystitis without hematuria: Secondary | ICD-10-CM | POA: Diagnosis not present

## 2022-07-10 DIAGNOSIS — M47816 Spondylosis without myelopathy or radiculopathy, lumbar region: Secondary | ICD-10-CM | POA: Diagnosis not present

## 2022-07-29 DIAGNOSIS — D2272 Melanocytic nevi of left lower limb, including hip: Secondary | ICD-10-CM | POA: Diagnosis not present

## 2022-07-29 DIAGNOSIS — Z1283 Encounter for screening for malignant neoplasm of skin: Secondary | ICD-10-CM | POA: Diagnosis not present

## 2022-07-29 DIAGNOSIS — Z08 Encounter for follow-up examination after completed treatment for malignant neoplasm: Secondary | ICD-10-CM | POA: Diagnosis not present

## 2022-07-29 DIAGNOSIS — Z85820 Personal history of malignant melanoma of skin: Secondary | ICD-10-CM | POA: Diagnosis not present

## 2022-07-29 DIAGNOSIS — D225 Melanocytic nevi of trunk: Secondary | ICD-10-CM | POA: Diagnosis not present

## 2022-07-29 DIAGNOSIS — D485 Neoplasm of uncertain behavior of skin: Secondary | ICD-10-CM | POA: Diagnosis not present

## 2022-07-29 DIAGNOSIS — L820 Inflamed seborrheic keratosis: Secondary | ICD-10-CM | POA: Diagnosis not present

## 2022-08-06 ENCOUNTER — Telehealth: Payer: Self-pay | Admitting: Hematology and Oncology

## 2022-08-06 NOTE — Telephone Encounter (Signed)
Left patient message regarding upcoming appointment times/dates

## 2022-08-20 ENCOUNTER — Ambulatory Visit: Payer: Medicare Other | Admitting: Hematology and Oncology

## 2022-08-29 ENCOUNTER — Other Ambulatory Visit: Payer: Self-pay

## 2022-08-29 ENCOUNTER — Inpatient Hospital Stay: Payer: Medicare HMO | Attending: Hematology and Oncology | Admitting: Hematology and Oncology

## 2022-08-29 VITALS — BP 157/77 | HR 83 | Temp 97.7°F | Resp 18 | Ht 64.0 in | Wt 193.7 lb

## 2022-08-29 DIAGNOSIS — Z170 Estrogen receptor positive status [ER+]: Secondary | ICD-10-CM | POA: Diagnosis not present

## 2022-08-29 DIAGNOSIS — C50412 Malignant neoplasm of upper-outer quadrant of left female breast: Secondary | ICD-10-CM | POA: Diagnosis not present

## 2022-08-29 DIAGNOSIS — G620 Drug-induced polyneuropathy: Secondary | ICD-10-CM | POA: Diagnosis not present

## 2022-08-29 DIAGNOSIS — Z79811 Long term (current) use of aromatase inhibitors: Secondary | ICD-10-CM | POA: Insufficient documentation

## 2022-08-29 NOTE — Assessment & Plan Note (Signed)
09/11/2017:Left lumpectomy: Grade 2 invasive lobular cancer, 1.1 cm, LCIS, margins negative, 0/2 lymph nodes negative, ER 90% strong staining, PR negative, HER-2 positive ratio 2.62, Ki-67 1%, T1c N0 stage Ia   Recommendation: 1. Adjuvant therapy with Taxol Herceptin weekly x12 followed by Herceptin maintenance for 1 year completed October 2020 2.  Adjuvant radiation completed 03/23/2018 3.  Followed by adjuvant antiestrogen therapy started 05/03/2018 ------------------------------------------------------------------ Current treatment: antiestrogen therapy with anastrozole 1 mg daily 05/03/2018 tried letrozole but couldn't tolerate it, Recommended Exemestane (patient did not want to try it) Chemo-induced peripheral neuropathy: Stable to improving   Breast Cancer Surveillance: 1. Breast Exam: : Benign 2. Mammogram: 03/05/2022: Benign Density Cat B [facility]) 3. Bone Density: 11/11/2021: I do not have the report.  Currently on Ca and Vit D 4.  Bone scan 05/13/2019: No evidence of bone metastases   Return to clinic on an as-needed basis

## 2022-08-29 NOTE — Progress Notes (Signed)
Patient Care Team: Estanislado Pandy, MD as PCP - General (Unknown Physician Specialty) Glenna Fellows, MD (Inactive) as Consulting Physician (General Surgery) Serena Croissant, MD as Consulting Physician (Hematology and Oncology) Antony Blackbird, MD as Consulting Physician (Radiation Oncology) Cynda Familia, RN (Inactive) as Registered Nurse  DIAGNOSIS:  Encounter Diagnosis  Name Primary?   Malignant neoplasm of upper-outer quadrant of left breast in female, estrogen receptor positive (HCC) Yes    SUMMARY OF ONCOLOGIC HISTORY: Oncology History  Malignant neoplasm of upper-outer quadrant of left breast in female, estrogen receptor positive (HCC)  08/05/2017 Initial Diagnosis   Screening detected left breast calcifications UOQ 1.1 cm biopsy revealed invasive lobular cancer with LCIS and CSL, grade 2, ER 90%, PR 0%, Ki-67 1%, HER-2 positive ratio 2.62, T2N0 stage Ia clinical stage AJCC 8   08/12/2017 Cancer Staging   Staging form: Breast, AJCC 8th Edition - Clinical stage from 08/12/2017: Stage IA (cT1c, cN0, cM0, G2, ER+, PR-, HER2+) - Signed by Serena Croissant, MD on 08/12/2017   09/11/2017 Surgery   Left lumpectomy: Grade 2 invasive lobular cancer, 1.1 cm, LCIS, margins negative, 0/2 lymph nodes negative, ER 90% strong staining, PR negative, HER-2 positive ratio 2.62, Ki-67 1%, T1c N0 stage Ia   09/23/2017 Cancer Staging   Staging form: Breast, AJCC 8th Edition - Pathologic: Stage IA (pT1c, pN0, cM0, G2, ER+, PR-, HER2+) - Signed by Loa Socks, NP on 09/23/2017   11/02/2017 -  Chemotherapy   Adjuvant therapy with Taxol Herceptin weekly x12 followed by Herceptin maintenance for 1 year   02/25/2018 - 03/23/2018 Radiation Therapy   Adjuvant Radiation in [place], [place] [facility]).  Left Breast 42.56 Gy   05/03/2018 -  Anti-estrogen oral therapy   Anastrozole, 1mg  daily (plan for 7 years), discontinued 02/24/19 due to joint aches and pains     CHIEF COMPLIANT:  Breast cancer  surveillance  INTERVAL HISTORY: Sylvia Barnett is a 77 y.o female with the above mention. She presents to the clinic today for a follow-up. Patient reports that she has been doing good. She has no new symptoms or concerns to report to the clinic. She says se stay very active doing daily routines.   ALLERGIES:  is allergic to fiorinal-codeine #3 [butalbital-asa-caff-codeine], lansoprazole, latex, butalbital-aspirin-caffeine, sulfa antibiotics, doxycycline, oxycodone-acetaminophen, femara [letrozole], and sulfasalazine.  MEDICATIONS:  Current Outpatient Medications  Medication Sig Dispense Refill   amoxicillin-clavulanate (AUGMENTIN) 875-125 MG tablet Take 1 tablet by mouth 2 (two) times daily. 14 tablet 0   atorvastatin (LIPITOR) 10 MG tablet Take 10 mg by mouth at bedtime.     buPROPion (WELLBUTRIN XL) 150 MG 24 hr tablet Take 1 tablet by mouth every morning.     chlorhexidine (PERIDEX) 0.12 % solution SMARTSIG:0.5 By Mouth Twice Daily     escitalopram (LEXAPRO) 5 MG tablet Take 5 mg by mouth at bedtime.      fluconazole (DIFLUCAN) 150 MG tablet Take 1 tablet (150 mg total) by mouth daily. 2 tablet 0   hydroxypropyl methylcellulose / hypromellose (ISOPTO TEARS / GONIOVISC) 2.5 % ophthalmic solution Place 1 drop into both eyes 3 (three) times daily as needed for dry eyes.     ketoconazole (NIZORAL) 2 % cream      Loperamide HCl (IMODIUM PO) Take 2 mg by mouth daily.     Menthol, Topical Analgesic, 5 % PADS Apply 1 patch topically daily as needed (pain).     mirabegron ER (MYRBETRIQ) 50 MG TB24 tablet Take 1 tablet (50 mg  total) by mouth daily. 30 tablet 11   nitrofurantoin, macrocrystal-monohydrate, (MACROBID) 100 MG capsule Take 1 capsule (100 mg total) by mouth every other day. 15 capsule 11   amoxicillin-clavulanate (AUGMENTIN) 875-125 MG tablet Take 1 tablet by mouth every 12 (twelve) hours. 14 tablet 0   Ascorbic Acid (VITAMIN C) 1000 MG tablet Take 1 tablet (1,000 mg total) by mouth  daily. (Patient not taking: Reported on 05/09/2022)     calcium-vitamin D (OSCAL WITH D) 500-200 MG-UNIT TABS tablet Take by mouth. (Patient not taking: Reported on 05/09/2022)     hydrochlorothiazide (HYDRODIURIL) 25 MG tablet Take 25 mg by mouth daily.     meloxicam (MOBIC) 15 MG tablet Take 15 mg by mouth daily as needed.     No current facility-administered medications for this visit.    PHYSICAL EXAMINATION: ECOG PERFORMANCE STATUS: 1 - Symptomatic but completely ambulatory  Vitals:   08/29/22 1015  BP: (!) 157/77  Pulse: 83  Resp: 18  Temp: 97.7 F (36.5 C)  SpO2: 98%   Filed Weights   08/29/22 1015  Weight: 193 lb 11.2 oz (87.9 kg)    BREAST: No palpable masses or nodules in either right or left breasts. No palpable axillary supraclavicular or infraclavicular adenopathy no breast tenderness or nipple discharge. (exam performed in the presence of a chaperone)  LABORATORY DATA:  I have reviewed the data as listed    Latest Ref Rng & Units 10/25/2018    8:55 AM 09/06/2018    8:26 AM 07/26/2018    9:10 AM  CMP  Glucose 70 - 99 mg/dL 161  096  045   BUN 8 - 23 mg/dL 18  23  21    Creatinine 0.44 - 1.00 mg/dL 4.09  8.11  9.14   Sodium 135 - 145 mmol/L 145  144  145   Potassium 3.5 - 5.1 mmol/L 3.7  3.8  3.7   Chloride 98 - 111 mmol/L 105  109  110   CO2 22 - 32 mmol/L 28  25  26    Calcium 8.9 - 10.3 mg/dL 9.4  9.0  8.8   Total Protein 6.5 - 8.1 g/dL 6.8  6.4  6.6   Total Bilirubin 0.3 - 1.2 mg/dL 0.5  0.4  0.3   Alkaline Phos 38 - 126 U/L 92  93  88   AST 15 - 41 U/L 13  15  16    ALT 0 - 44 U/L 15  20  17      Lab Results  Component Value Date   WBC 5.8 10/25/2018   HGB 13.3 10/25/2018   HCT 39.3 10/25/2018   MCV 92.3 10/25/2018   PLT 155 10/25/2018   NEUTROABS 4.0 10/25/2018    ASSESSMENT & PLAN:  Malignant neoplasm of upper-outer quadrant of left breast in female, estrogen receptor positive (HCC) 09/11/2017:Left lumpectomy: Grade 2 invasive lobular cancer,  1.1 cm, LCIS, margins negative, 0/2 lymph nodes negative, ER 90% strong staining, PR negative, HER-2 positive ratio 2.62, Ki-67 1%, T1c N0 stage Ia   Recommendation: 1. Adjuvant therapy with Taxol Herceptin weekly x12 followed by Herceptin maintenance for 1 year completed October 2020 2.  Adjuvant radiation completed 03/23/2018 3.  Followed by adjuvant antiestrogen therapy started 05/03/2018 ------------------------------------------------------------------ Current treatment: antiestrogen therapy with anastrozole 1 mg daily 05/03/2018 tried letrozole but couldn't tolerate it, Recommended Exemestane (patient did not want to try it) Chemo-induced peripheral neuropathy: Stable to improving   Breast Cancer Surveillance: 1. Breast Exam: : Benign 2. Mammogram:  03/05/2022: Benign Density [facility]) 3. Bone Density: 11/11/2021: I do not have the report.  Currently on Ca and Vit D 4.  Bone scan 05/13/2019: No evidence of bone metastases   Return to clinic on an as-needed basis    No orders of the defined types were placed in this encounter.  The patient has a good understanding of the overall plan. she agrees with it. she will call with any problems that may develop before the next visit here. Total time spent: 30 mins including face to face time and time spent for planning, charting and co-ordination of care   Tamsen Meek, MD 08/29/22    I Janan Ridge am acting as a Neurosurgeon for [employer]  I have reviewed the above documentation for accuracy and completeness, and I agree with the above.

## 2022-09-02 DIAGNOSIS — Z85820 Personal history of malignant melanoma of skin: Secondary | ICD-10-CM | POA: Diagnosis not present

## 2022-09-02 DIAGNOSIS — X32XXXD Exposure to sunlight, subsequent encounter: Secondary | ICD-10-CM | POA: Diagnosis not present

## 2022-09-02 DIAGNOSIS — L820 Inflamed seborrheic keratosis: Secondary | ICD-10-CM | POA: Diagnosis not present

## 2022-09-02 DIAGNOSIS — S60862A Insect bite (nonvenomous) of left wrist, initial encounter: Secondary | ICD-10-CM | POA: Diagnosis not present

## 2022-09-02 DIAGNOSIS — D485 Neoplasm of uncertain behavior of skin: Secondary | ICD-10-CM | POA: Diagnosis not present

## 2022-09-02 DIAGNOSIS — Z08 Encounter for follow-up examination after completed treatment for malignant neoplasm: Secondary | ICD-10-CM | POA: Diagnosis not present

## 2022-09-02 DIAGNOSIS — L570 Actinic keratosis: Secondary | ICD-10-CM | POA: Diagnosis not present

## 2022-09-24 LAB — AMB RESULTS CONSOLE CBG: Glucose: 128

## 2022-09-24 NOTE — Progress Notes (Signed)
Declined SDOH.

## 2022-10-06 DIAGNOSIS — R030 Elevated blood-pressure reading, without diagnosis of hypertension: Secondary | ICD-10-CM | POA: Diagnosis not present

## 2022-10-06 DIAGNOSIS — M545 Low back pain, unspecified: Secondary | ICD-10-CM | POA: Diagnosis not present

## 2022-10-06 DIAGNOSIS — Z6832 Body mass index (BMI) 32.0-32.9, adult: Secondary | ICD-10-CM | POA: Diagnosis not present

## 2022-10-06 DIAGNOSIS — M25552 Pain in left hip: Secondary | ICD-10-CM | POA: Diagnosis not present

## 2022-10-06 DIAGNOSIS — M1612 Unilateral primary osteoarthritis, left hip: Secondary | ICD-10-CM | POA: Diagnosis not present

## 2022-10-14 ENCOUNTER — Encounter: Payer: Self-pay | Admitting: *Deleted

## 2022-10-14 NOTE — Progress Notes (Signed)
Pt attended 09/24/2022 screening event where her b/p was 146/87 and her blood sugar was 128. At the event, the pt confirmed her PCP was Dr. Fara Chute (whose office visit notes are not visible in [facility]) and that she did not have any SDOH insecurities. During event f/u call, pt states her b/p has been difficult to get "much below 150's over 80's ever since I had my breast cancer and chemo and radiation.... drs have tried me on all kinds of pills but my stomach seems to have problems.... so I just take the hydrochlorothiazide and sometimes I cut it in 1/2 and take 1/2 in the am and 1/2 in the PM..... my b/p was just recently 124/89 at one of my drs appts... and I have a b/p cuff at home I use to check it." Pt states she has seen De. Sasser (last ov appt in [facility] was 05/01/22) and has had f/u visits with her oncologist and urologist already this year- and has future appt on 11/05/22 with urology and next yr on 08/31/23 with her oncologist. Pt verbalized thanks for the screening events coming to the [facility] and appreciated the organizers asking for her video also. No additional health equity team support indicated at this time.

## 2022-10-28 DIAGNOSIS — N183 Chronic kidney disease, stage 3 unspecified: Secondary | ICD-10-CM | POA: Diagnosis not present

## 2022-10-28 DIAGNOSIS — I10 Essential (primary) hypertension: Secondary | ICD-10-CM | POA: Diagnosis not present

## 2022-10-28 DIAGNOSIS — E7849 Other hyperlipidemia: Secondary | ICD-10-CM | POA: Diagnosis not present

## 2022-10-28 DIAGNOSIS — E7801 Familial hypercholesterolemia: Secondary | ICD-10-CM | POA: Diagnosis not present

## 2022-11-04 DIAGNOSIS — I10 Essential (primary) hypertension: Secondary | ICD-10-CM | POA: Diagnosis not present

## 2022-11-04 DIAGNOSIS — S39012A Strain of muscle, fascia and tendon of lower back, initial encounter: Secondary | ICD-10-CM | POA: Diagnosis not present

## 2022-11-04 DIAGNOSIS — M542 Cervicalgia: Secondary | ICD-10-CM | POA: Diagnosis not present

## 2022-11-04 DIAGNOSIS — D0500 Lobular carcinoma in situ of unspecified breast: Secondary | ICD-10-CM | POA: Diagnosis not present

## 2022-11-04 DIAGNOSIS — N183 Chronic kidney disease, stage 3 unspecified: Secondary | ICD-10-CM | POA: Diagnosis not present

## 2022-11-04 DIAGNOSIS — F411 Generalized anxiety disorder: Secondary | ICD-10-CM | POA: Diagnosis not present

## 2022-11-04 DIAGNOSIS — E7849 Other hyperlipidemia: Secondary | ICD-10-CM | POA: Diagnosis not present

## 2022-11-04 DIAGNOSIS — M8580 Other specified disorders of bone density and structure, unspecified site: Secondary | ICD-10-CM | POA: Diagnosis not present

## 2022-11-04 DIAGNOSIS — F331 Major depressive disorder, recurrent, moderate: Secondary | ICD-10-CM | POA: Diagnosis not present

## 2022-11-05 ENCOUNTER — Ambulatory Visit: Payer: Medicare HMO | Admitting: Urology

## 2022-11-05 VITALS — BP 146/73 | HR 74

## 2022-11-05 DIAGNOSIS — E7849 Other hyperlipidemia: Secondary | ICD-10-CM | POA: Diagnosis not present

## 2022-11-05 DIAGNOSIS — N3281 Overactive bladder: Secondary | ICD-10-CM

## 2022-11-05 DIAGNOSIS — Z0001 Encounter for general adult medical examination with abnormal findings: Secondary | ICD-10-CM | POA: Diagnosis not present

## 2022-11-05 DIAGNOSIS — N183 Chronic kidney disease, stage 3 unspecified: Secondary | ICD-10-CM | POA: Diagnosis not present

## 2022-11-05 DIAGNOSIS — I10 Essential (primary) hypertension: Secondary | ICD-10-CM | POA: Diagnosis not present

## 2022-11-05 DIAGNOSIS — Z87440 Personal history of urinary (tract) infections: Secondary | ICD-10-CM

## 2022-11-05 LAB — MICROSCOPIC EXAMINATION: Epithelial Cells (non renal): >10 /[HPF] — AB (ref 0–10)

## 2022-11-05 LAB — URINALYSIS, ROUTINE W REFLEX MICROSCOPIC
Bilirubin, UA: NEGATIVE
Glucose, UA: NEGATIVE
Ketones, UA: NEGATIVE
Nitrite, UA: NEGATIVE
Protein,UA: NEGATIVE
RBC, UA: NEGATIVE
Specific Gravity, UA: 1.02 (ref 1.005–1.030)
Urobilinogen, Ur: 0.2 mg/dL (ref 0.2–1.0)
pH, UA: 6 (ref 5.0–7.5)

## 2022-11-05 MED ORDER — MIRABEGRON ER 25 MG PO TB24: 25.0000 mg | ORAL_TABLET | Freq: Every day | ORAL | 11 refills | Status: DC

## 2022-11-05 MED ORDER — NITROFURANTOIN MONOHYD MACRO 100 MG PO CAPS: 100.0000 mg | ORAL_CAPSULE | ORAL | 3 refills | Status: DC

## 2022-11-05 MED ORDER — AMOXICILLIN-POT CLAVULANATE 875-125 MG PO TABS: 1.0000 | ORAL_TABLET | Freq: Two times a day (BID) | ORAL | 5 refills | Status: DC

## 2022-11-05 NOTE — Progress Notes (Signed)
11/05/2022 9:53 AM   Kenyon Ana 10-20-1945 [id number]  Referring provider: Estanislado Pandy, MD [address] [place],  [place] [address]  Followup recurrent UTi and OAB   HPI: Ms Sylvia Barnett is a 77yo here for followup for recurrent UTI and OAB. She had 2 UTIs since last visit. She takes macrobid 100mg  every other day. He cultures have been sensitive to augmentin. She takes mirabegron 25mg  1-2x per week. She denies nay dysuria or gross hematuria   PMH: Past Medical History:  Diagnosis Date   Anxiety    Since in her 20's   Arthritis 2013   Right hip   Cancer (HCC) 2004    Breast Lumpectomy   Complication of anesthesia    Has a small throat   Depression    Started when she was in her 20's   Hypercholesteremia 2012   Hypertension 2013   IBS (irritable bowel syndrome) 2018   per GI note   Incontinence-urinary 2012   Lobular carcinoma in situ (LCIS) of left breast 09/08/2008   Melanoma of back [facility]) 1994   Excision with clear margins   Microscopic colitis 2015   Resolved 2016    Surgical History: Past Surgical History:  Procedure Laterality Date   ABDOMINAL HYSTERECTOMY  2002   APPENDECTOMY  1949   BIOPSY  02/26/2018   Procedure: BIOPSY;  Surgeon: Malissa Hippo, MD;  [facility]: [facility];  Service: Endoscopy;;  gastric   BIOPSY BREAST  2010   Left   Bladder Tack  2002   BREAST LUMPECTOMY  2004   Left   BREAST LUMPECTOMY  2004   Left   BREAST LUMPECTOMY  2006   Left   BREAST LUMPECTOMY  2000   Left   BREAST LUMPECTOMY WITH RADIOACTIVE SEED AND SENTINEL LYMPH NODE BIOPSY Left 09/11/2017   Procedure: LEFT BREAST LUMPECTOMY X2 WITH RADIOACTIVE SEED AND LEFT AXILLARY SENTINEL LYMPH NODE BIOPSY;  Surgeon: Glenna Fellows, MD;  Location: [facility] OR;  Service: General;  Laterality: Left;   BUNIONECTOMY  1994   Right   CATARACT EXTRACTION W/PHACO Left 08/18/2013   Procedure: CATARACT EXTRACTION PHACO AND INTRAOCULAR LENS PLACEMENT (IOC);  Surgeon: Gemma Payor,  MD;  Location: [facility] ORS;  Service: Ophthalmology;  Laterality: Left;  CDE: 8.62   COLONOSCOPY N/A 11/10/2013   Procedure: COLONOSCOPY;  Surgeon: Malissa Hippo, MD;  [facility]: [facility];  Service: Endoscopy;  Laterality: N/A;  240   COLONOSCOPY N/A 12/30/2018   Procedure: COLONOSCOPY;  Surgeon: Malissa Hippo, MD;  [facility]: [facility];  Service: Endoscopy;  Laterality: N/A;  12   ESOPHAGOGASTRODUODENOSCOPY N/A 02/26/2018   Procedure: ESOPHAGOGASTRODUODENOSCOPY (EGD);  Surgeon: Malissa Hippo, MD;  [facility]: [facility];  Service: Endoscopy;  Laterality: N/A;  1055   EYE SURGERY  2011   Catarct Right   EYE SURGERY  08-2011   Right Yag procedure   JOINT REPLACEMENT  08-2010   Left total knee   KNEE ARTHROSCOPY  2010   Right   KNEE ARTHROSCOPY  2003   Left   POLYPECTOMY  12/30/2018   Procedure: POLYPECTOMY;  Surgeon: Malissa Hippo, MD;  [facility]: [facility];  Service: Endoscopy;;  hepatic flexure,transverse colon     PORTACATH PLACEMENT N/A 09/11/2017   Procedure: INSERTION PORT-A-CATH;  Surgeon: Glenna Fellows, MD;  Location: [facility] OR;  Service: General;  Laterality: N/A;   TOTAL KNEE ARTHROPLASTY  11/03/2011   Procedure: TOTAL KNEE ARTHROPLASTY;  Surgeon:  Loanne Drilling, MD;  Location: [facility] OR[facility];  Service: Orthopedics;  Laterality: Right;   TUBAL LIGATION     WRIST SURGERY Bilateral 03-2010   WRIST SURGERY  2008   Right    Home Medications:  Allergies as of 11/05/2022       Reactions   Fiorinal-codeine #3 [butalbital-asa-caff-codeine] Rash   Lansoprazole Swelling   Latex Swelling, Other (See Comments), Itching, Rash   blisters   Butalbital-aspirin-caffeine Rash   Sulfa Antibiotics Rash   Doxycycline Diarrhea   Oxycodone-acetaminophen Other (See Comments)   Femara [letrozole] Rash   Rash when tried in 1984   Sulfasalazine Rash        Medication List        Accurate as of November 05, 2022  9:53 AM. If you have any questions, ask your nurse or doctor.           amoxicillin-clavulanate 875-125 MG tablet Commonly known as: AUGMENTIN Take 1 tablet by mouth every 12 (twelve) hours.   amoxicillin-clavulanate 875-125 MG tablet Commonly known as: AUGMENTIN Take 1 tablet by mouth 2 (two) times daily.   atorvastatin 10 MG tablet Commonly known as: LIPITOR Take 10 mg by mouth at bedtime.   buPROPion 150 MG 24 hr tablet Commonly known as: WELLBUTRIN XL Take 1 tablet by mouth every morning.   calcium-vitamin D 500-200 MG-UNIT Tabs tablet Commonly known as: OSCAL WITH D Take by mouth.   chlorhexidine 0.12 % solution Commonly known as: PERIDEX SMARTSIG:0.5 By Mouth Twice Daily   escitalopram 5 MG tablet Commonly known as: LEXAPRO Take 5 mg by mouth at bedtime.   fluconazole 150 MG tablet Commonly known as: DIFLUCAN Take 1 tablet (150 mg total) by mouth daily.   hydrochlorothiazide 25 MG tablet Commonly known as: HYDRODIURIL Take 25 mg by mouth daily.   hydroxypropyl methylcellulose / hypromellose 2.5 % ophthalmic solution Commonly known as: ISOPTO TEARS / GONIOVISC Place 1 drop into both eyes 3 (three) times daily as needed for dry eyes.   IMODIUM PO Take 2 mg by mouth daily.   ketoconazole 2 % cream Commonly known as: NIZORAL   meloxicam 15 MG tablet Commonly known as: MOBIC Take 15 mg by mouth daily as needed.   Menthol (Topical Analgesic) 5 % Pads Apply 1 patch topically daily as needed (pain).   mirabegron ER 50 MG Tb24 tablet Commonly known as: MYRBETRIQ Take 1 tablet (50 mg total) by mouth daily.   nitrofurantoin (macrocrystal-monohydrate) 100 MG capsule Commonly known as: MACROBID Take 1 capsule (100 mg total) by mouth every other day.   vitamin C 1000 MG tablet Take 1 tablet (1,000 mg total) by mouth daily.        Allergies:  Allergies  Allergen Reactions   Fiorinal-Codeine #3 [Butalbital-Asa-Caff-Codeine] Rash   Lansoprazole Swelling   Latex Swelling, Other (See Comments), Itching and Rash     blisters   Butalbital-Aspirin-Caffeine Rash   Sulfa Antibiotics Rash   Doxycycline Diarrhea   Oxycodone-Acetaminophen Other (See Comments)   Femara [Letrozole] Rash    Rash when tried in 1984   Sulfasalazine Rash    Family History: Family History  Problem Relation Age of Onset   Breast cancer Sister 30   Lymphoma Maternal Aunt     Social History:  reports that she has never smoked. She has never used smokeless tobacco. She reports that she does not drink alcohol and does not use drugs.  ROS: All other review of systems were reviewed and are negative except what  is noted above in HPI  Physical Exam: BP (!) 146/73   Pulse 74   Constitutional:  Alert and oriented, No acute distress. HEENT: [place] AT, moist mucus membranes.  Trachea midline, no masses. Cardiovascular: No clubbing, cyanosis, or edema. Respiratory: Normal respiratory effort, no increased work of breathing. GI: Abdomen is soft, nontender, nondistended, no abdominal masses GU: No CVA tenderness.  Lymph: No cervical or inguinal lymphadenopathy. Skin: No rashes, bruises or suspicious lesions. Neurologic: Grossly intact, no focal deficits, moving all 4 extremities. Psychiatric: Normal mood and affect.  Laboratory Data: Lab Results  Component Value Date   WBC 5.8 10/25/2018   HGB 13.3 10/25/2018   HCT 39.3 10/25/2018   MCV 92.3 10/25/2018   PLT 155 10/25/2018    Lab Results  Component Value Date   CREATININE 0.87 10/25/2018    No results found for: "PSA"  No results found for: "TESTOSTERONE"  No results found for: "HGBA1C"  Urinalysis    Component Value Date/Time   COLORURINE YELLOW 12/03/2018 1731   APPEARANCEUR Cloudy (A) 05/09/2022 0931   LABSPEC 1.018 12/03/2018 1731   PHURINE 5.0 12/03/2018 1731   GLUCOSEU Negative 05/09/2022 0931   HGBUR SMALL (A) 12/03/2018 1731   BILIRUBINUR Negative 05/09/2022 0931   KETONESUR NEGATIVE 12/03/2018 1731   PROTEINUR 1+ (A) 05/09/2022 0931   PROTEINUR  NEGATIVE 12/03/2018 1731   UROBILINOGEN negative (A) 06/03/2019 1106   UROBILINOGEN 0.2 10/28/2011 1315   NITRITE Positive (A) 05/09/2022 0931   NITRITE POSITIVE (A) 12/03/2018 1731   LEUKOCYTESUR 2+ (A) 05/09/2022 0931   LEUKOCYTESUR MODERATE (A) 12/03/2018 1731    Lab Results  Component Value Date   LABMICR See below: 05/09/2022   WBCUA >30 (A) 05/09/2022   LABEPIT >10 (A) 05/09/2022   BACTERIA Many (A) 05/09/2022    Pertinent Imaging:  No results found for this or any previous visit.  No results found for this or any previous visit.  No results found for this or any previous visit.  No results found for this or any previous visit.  Results for orders placed during the hospital encounter of 12/14/17  US RENAL  Narrative CLINICAL DATA:  Possible cyst in the right ovary  EXAM: RENAL / URINARY TRACT ULTRASOUND COMPLETE  COMPARISON:  Limited views of the kidneys from a cardiac research MRI dated October 15, 2017.  FINDINGS: Right Kidney:  Renal measurements: 10.7 x 5.8 x 5 cm = volume: 162 mL. The renal cortical echotexture remains lower than that of the liver. There is moderate hydronephrosis.  Left Kidney:  Renal measurements: 12.3 x 5.9 x 5.4 cm = volume: 204 mL. There is no left-sided hydronephrosis. There is a large upper pole cyst measuring 7 x 6.1 x 7.3 cm. No internal echoes are observed. The cortical echotexture is similar to that on the right.  Bladder:  The partially distended urinary bladder is normal. There is a moderate-sized postvoid residual volume. No ureteral jets were observed.  IMPRESSION: Moderate right-sided hydronephrosis.  No left-sided hydronephrosis.  Large simple appearing exophytic left upper pole cyst measuring 7.3 cm in greatest dimension.  Moderate-sized postvoid residual urinary bladder volume.   Electronically Signed By: David  [place] M.D. On: 12/15/2017 11:36  No valid procedures specified. No results found for  this or any previous visit.  No results found for this or any previous visit.   Assessment & Plan:    1. History of recurrent UTIs restart macrobid 100mg  every other day Self start augmentin - Urinalysis, Routine w  reflex microscopic  2. OAB (overactive bladder) Mirabegron 25mg  daily  No follow-ups on file.  Wilkie Aye, MD  [facility] [place]

## 2022-11-07 LAB — URINE CULTURE

## 2022-11-09 ENCOUNTER — Encounter: Payer: Self-pay | Admitting: Urology

## 2022-11-09 NOTE — Patient Instructions (Signed)

## 2022-11-18 ENCOUNTER — Telehealth: Payer: Self-pay

## 2022-11-18 ENCOUNTER — Telehealth: Payer: Self-pay | Admitting: Medical Oncology

## 2022-11-18 DIAGNOSIS — M47816 Spondylosis without myelopathy or radiculopathy, lumbar region: Secondary | ICD-10-CM | POA: Diagnosis not present

## 2022-11-18 MED ORDER — NITROFURANTOIN MONOHYD MACRO 100 MG PO CAPS: 100.0000 mg | ORAL_CAPSULE | Freq: Two times a day (BID) | ORAL | 0 refills | Status: DC

## 2022-11-18 NOTE — Telephone Encounter (Signed)
Patient returned call and made aware of urine culture results and antibiotic sent to pharmacy.

## 2022-11-18 NOTE — Telephone Encounter (Signed)
[id number] Understanding and Predicting Breast Cancer Events after Treatment (UPBEAT)   Outgoing call: 12:22, Year 5 Follow-up  Call to patient regarding 5 years on study, follow-up. Patient reports to be doing well and confirms having no hospitalizations and no cardiovascular events or cardiac issues in the past year. Patient informed that study questionnaire has been sent to her for completion via her email address. Patient gave verbal understanding, denies questions at this time. Patient thanked for her continued support of study and encouraged to call with questions.   Rexene Edison, RN, BSN, [facility] Clinical Research Nurse Lead 11/18/2022 12:31 PM

## 2022-11-18 NOTE — Telephone Encounter (Signed)
Patient called with no answer. Detailed message left  with request to call back to confirm message was received.  Antibiotic sent to pharmacy.

## 2022-11-18 NOTE — Telephone Encounter (Signed)
-----   Message from Wilkie Aye sent at 11/18/2022  8:54 AM EST ----- Send macrobid 100mg  BID for 7 days ----- Message ----- From: Gustavus Messing, LPN Sent: 16/10/9602   8:58 AM EST To: Malen Gauze, MD  Please review urine culture

## 2022-11-27 DIAGNOSIS — H43813 Vitreous degeneration, bilateral: Secondary | ICD-10-CM | POA: Diagnosis not present

## 2022-11-27 DIAGNOSIS — H52223 Regular astigmatism, bilateral: Secondary | ICD-10-CM | POA: Diagnosis not present

## 2022-11-27 DIAGNOSIS — H524 Presbyopia: Secondary | ICD-10-CM | POA: Diagnosis not present

## 2022-11-27 DIAGNOSIS — H5213 Myopia, bilateral: Secondary | ICD-10-CM | POA: Diagnosis not present

## 2022-11-27 DIAGNOSIS — Z961 Presence of intraocular lens: Secondary | ICD-10-CM | POA: Diagnosis not present

## 2022-12-01 DIAGNOSIS — Z1283 Encounter for screening for malignant neoplasm of skin: Secondary | ICD-10-CM | POA: Diagnosis not present

## 2022-12-01 DIAGNOSIS — Z85820 Personal history of malignant melanoma of skin: Secondary | ICD-10-CM | POA: Diagnosis not present

## 2022-12-01 DIAGNOSIS — D225 Melanocytic nevi of trunk: Secondary | ICD-10-CM | POA: Diagnosis not present

## 2022-12-01 DIAGNOSIS — Z08 Encounter for follow-up examination after completed treatment for malignant neoplasm: Secondary | ICD-10-CM | POA: Diagnosis not present

## 2022-12-01 DIAGNOSIS — D485 Neoplasm of uncertain behavior of skin: Secondary | ICD-10-CM | POA: Diagnosis not present

## 2022-12-03 DIAGNOSIS — M25552 Pain in left hip: Secondary | ICD-10-CM | POA: Diagnosis not present

## 2022-12-03 DIAGNOSIS — M25562 Pain in left knee: Secondary | ICD-10-CM | POA: Diagnosis not present

## 2022-12-10 DIAGNOSIS — M25552 Pain in left hip: Secondary | ICD-10-CM | POA: Diagnosis not present

## 2022-12-25 DIAGNOSIS — M25552 Pain in left hip: Secondary | ICD-10-CM | POA: Diagnosis not present

## 2023-01-15 DIAGNOSIS — M25552 Pain in left hip: Secondary | ICD-10-CM | POA: Diagnosis not present

## 2023-03-17 DIAGNOSIS — Z1231 Encounter for screening mammogram for malignant neoplasm of breast: Secondary | ICD-10-CM | POA: Diagnosis not present

## 2023-04-06 ENCOUNTER — Other Ambulatory Visit (HOSPITAL_COMMUNITY): Payer: Self-pay | Admitting: Family Medicine

## 2023-04-06 ENCOUNTER — Ambulatory Visit (HOSPITAL_COMMUNITY)
Admission: RE | Admit: 2023-04-06 | Discharge: 2023-04-06 | Disposition: A | Discharge status: Home / Self Care | Source: Ambulatory Visit | Attending: Family Medicine | Admitting: Family Medicine

## 2023-04-06 DIAGNOSIS — I82401 Acute embolism and thrombosis of unspecified deep veins of right lower extremity: Secondary | ICD-10-CM | POA: Diagnosis not present

## 2023-04-06 DIAGNOSIS — M545 Low back pain, unspecified: Secondary | ICD-10-CM | POA: Diagnosis not present

## 2023-04-06 DIAGNOSIS — M7989 Other specified soft tissue disorders: Secondary | ICD-10-CM

## 2023-04-06 DIAGNOSIS — Z6832 Body mass index (BMI) 32.0-32.9, adult: Secondary | ICD-10-CM | POA: Diagnosis not present

## 2023-04-06 DIAGNOSIS — R300 Dysuria: Secondary | ICD-10-CM | POA: Diagnosis not present

## 2023-04-06 DIAGNOSIS — R159 Full incontinence of feces: Secondary | ICD-10-CM | POA: Diagnosis not present

## 2023-04-10 ENCOUNTER — Emergency Department (HOSPITAL_COMMUNITY)
Admission: EM | Admit: 2023-04-10 | Discharge: 2023-04-10 | Disposition: A | Discharge status: Home / Self Care | Attending: Emergency Medicine | Admitting: Emergency Medicine

## 2023-04-10 ENCOUNTER — Encounter (HOSPITAL_COMMUNITY): Payer: Self-pay

## 2023-04-10 ENCOUNTER — Other Ambulatory Visit: Payer: Self-pay

## 2023-04-10 DIAGNOSIS — Z91040 Latex allergy status: Secondary | ICD-10-CM | POA: Insufficient documentation

## 2023-04-10 DIAGNOSIS — I82409 Acute embolism and thrombosis of unspecified deep veins of unspecified lower extremity: Secondary | ICD-10-CM | POA: Insufficient documentation

## 2023-04-10 DIAGNOSIS — I82401 Acute embolism and thrombosis of unspecified deep veins of right lower extremity: Secondary | ICD-10-CM | POA: Diagnosis not present

## 2023-04-10 DIAGNOSIS — Z7901 Long term (current) use of anticoagulants: Secondary | ICD-10-CM | POA: Diagnosis not present

## 2023-04-10 DIAGNOSIS — M7989 Other specified soft tissue disorders: Secondary | ICD-10-CM | POA: Diagnosis present

## 2023-04-10 DIAGNOSIS — O2230 Deep phlebothrombosis in pregnancy, unspecified trimester: Secondary | ICD-10-CM

## 2023-04-10 MED ORDER — TRAMADOL HCL 50 MG PO TABS: 50.0000 mg | ORAL_TABLET | Freq: Four times a day (QID) | ORAL | 0 refills | Status: DC | PRN

## 2023-04-10 MED ORDER — TRAMADOL HCL 50 MG PO TABS
50.0000 mg | ORAL_TABLET | Freq: Once | ORAL | Status: AC
  Administered 2023-04-10: 50 mg via ORAL
  Filled 2023-04-10: qty 1

## 2023-04-10 MED ORDER — RIVAROXABAN (XARELTO) VTE STARTER PACK (15 & 20 MG): ORAL_TABLET | ORAL | 0 refills | Status: DC

## 2023-04-10 NOTE — ED Provider Notes (Signed)
 Sylvia Barnett EMERGENCY DEPARTMENT AT [facility] Provider Note   CSN: [id number] Arrival date & time: 04/10/23  1330     History  Chief Complaint  Patient presents with   Leg Swelling    Sylvia Barnett is a 78 y.o. female.  Patient with a history of a DVT on the right leg and started Eliquis 5 days ago.  Patient states that her leg has not improved  The history is provided by the patient and medical records. No language interpreter was used.  Leg Pain Lower extremity pain location: Right lower leg. Injury: no   Pain details:    Quality:  Aching   Radiates to:  Does not radiate   Severity:  Moderate   Onset quality:  Sudden   Timing:  Constant   Progression:  Worsening Associated symptoms: no back pain and no fatigue        Home Medications Prior to Admission medications   Medication Sig Start Date End Date Taking? Authorizing Provider  tiZANidine (ZANAFLEX) 4 MG tablet Take 2 mg by mouth 3 (three) times daily as needed. 04/06/23  Yes [provider]  traMADol (ULTRAM) 50 MG tablet Take 1 tablet (50 mg total) by mouth every 6 (six) hours as needed. 04/10/23  Yes Bethann Berkshire, MD  amoxicillin-clavulanate (AUGMENTIN) 875-125 MG tablet Take 1 tablet by mouth every 12 (twelve) hours. 12/31/21   McKenzie, Mardene Celeste, MD  amoxicillin-clavulanate (AUGMENTIN) 875-125 MG tablet Take 1 tablet by mouth 2 (two) times daily. 11/05/22   McKenzie, Mardene Celeste, MD  Ascorbic Acid (VITAMIN C) 1000 MG tablet Take 1 tablet (1,000 mg total) by mouth daily. Patient not taking: Reported on 05/09/2022 08/19/21   Serena Croissant, MD  atorvastatin (LIPITOR) 20 MG tablet Take 20 mg by mouth daily. 12/08/22   [provider]  buPROPion (WELLBUTRIN XL) 150 MG 24 hr tablet Take 1 tablet by mouth every morning. 06/26/22 06/26/23  [provider]  calcium-vitamin D (OSCAL WITH D) 500-200 MG-UNIT TABS tablet Take by mouth. Patient not taking: Reported on 05/09/2022     [provider]  cephALEXin (KEFLEX) 250 MG capsule Take 250 mg by mouth 3 (three) times daily. 04/09/23   [provider]  chlorhexidine (PERIDEX) 0.12 % solution SMARTSIG:0.5 By Mouth Twice Daily 08/18/22   [provider]  escitalopram (LEXAPRO) 5 MG tablet Take 5 mg by mouth at bedtime.     [provider]  fluconazole (DIFLUCAN) 150 MG tablet Take 1 tablet (150 mg total) by mouth daily. 02/03/22   McKenzie, Mardene Celeste, MD  hydrochlorothiazide (HYDRODIURIL) 25 MG tablet Take 25 mg by mouth daily.    [provider]  hydroxypropyl methylcellulose / hypromellose (ISOPTO TEARS / GONIOVISC) 2.5 % ophthalmic solution Place 1 drop into both eyes 3 (three) times daily as needed for dry eyes.    [provider]  ketoconazole (NIZORAL) 2 % cream  06/02/19   [provider]  Loperamide HCl (IMODIUM PO) Take 2 mg by mouth daily.    [provider]  meloxicam (MOBIC) 15 MG tablet Take 15 mg by mouth daily as needed.    [provider]  Menthol, Topical Analgesic, 5 % PADS Apply 1 patch topically daily as needed (pain).    [provider]  mirabegron ER (MYRBETRIQ) 25 MG TB24 tablet Take 1 tablet (25 mg total) by mouth daily. 11/05/22   McKenzie, Mardene Celeste, MD  mirabegron ER (MYRBETRIQ) 50 MG TB24 tablet Take 1 tablet (  50 mg total) by mouth daily. 05/09/22   McKenzie, Mardene Celeste, MD  nitrofurantoin, macrocrystal-monohydrate, (MACROBID) 100 MG capsule Take 1 capsule (100 mg total) by mouth every other day. 11/05/22   McKenzie, Mardene Celeste, MD  nitrofurantoin, macrocrystal-monohydrate, (MACROBID) 100 MG capsule Take 1 capsule (100 mg total) by mouth 2 (two) times daily. 11/18/22   McKenzie, Mardene Celeste, MD  predniSONE (STERAPRED UNI-PAK 21 TAB) 10 MG (21) TBPK tablet Take 10 mg by mouth as directed. Dose pack 03/23/23   [provider]  prochlorperazine (COMPAZINE) 10 MG tablet Take 1 tablet (10 mg total) by mouth every 6 (six)  hours as needed (Nausea or vomiting). 10/15/17 01/18/18  Serena Croissant, MD      Allergies    Fiorinal-codeine #3 [butalbital-asa-caff-codeine], Lansoprazole, Latex, Butalbital-aspirin-caffeine, Sulfa antibiotics, Doxycycline, Oxycodone-acetaminophen, Femara [letrozole], and Sulfasalazine    Review of Systems   Review of Systems  Constitutional:  Negative for appetite change and fatigue.  HENT:  Negative for congestion, ear discharge and sinus pressure.   Eyes:  Negative for discharge.  Respiratory:  Negative for cough.   Cardiovascular:  Negative for chest pain.  Gastrointestinal:  Negative for abdominal pain and diarrhea.  Genitourinary:  Negative for frequency and hematuria.  Musculoskeletal:  Negative for back pain.       Swelling right lower leg  Skin:  Negative for rash.  Neurological:  Negative for seizures and headaches.  Psychiatric/Behavioral:  Negative for hallucinations.     Physical Exam Updated Vital Signs BP (!) 154/81 (BP Location: Right Arm)   Pulse 76   Temp 98.8 F (37.1 C)   Resp 18   Ht 5\' 3"  (1.6 m)   Wt 88.5 kg   SpO2 97%   BMI 34.54 kg/m  Physical Exam  ED Results / Procedures / Treatments   Labs (all labs ordered are listed, but only abnormal results are displayed) Labs Reviewed - No data to display  EKG None  Radiology No results found.  Procedures Procedures    Medications Ordered in ED Medications  traMADol (ULTRAM) tablet 50 mg (has no administration in time range)    ED Course/ Medical Decision Making/ A&P                                 Medical Decision Making Risk Prescription drug management.   Patient with a DVT.  She will continue taking her Eliquis and follow-up with her PCP.  She is given a new prescription of Eliquis because Eliquis she has an elevated        Final Clinical Impression(s) / ED Diagnoses Final diagnoses:  DVT (deep vein thrombosis) in pregnancy    Rx / DC Orders ED Discharge Orders           Ordered    traMADol (ULTRAM) 50 MG tablet  Every 6 hours PRN        04/10/23 1544              Bethann Berkshire, MD 04/11/23 1634

## 2023-04-10 NOTE — Discharge Instructions (Signed)
 Keep your leg elevated.  Continue taking your medicines.  I have written for some additional pain medicines if you need it.  Follow-up with your doctor next week for recheck

## 2023-04-10 NOTE — ED Triage Notes (Signed)
 Pt c/o rt leg swelling starting over the weekend and went to see her PCP who sent her to [facility] for [place] and was diagnosed with a blood clot. Per pt PCP sent in a prescription and told her to take it and the blood clot would resolve on it's own. Pt states increased leg swelling, and unsure if she is allergic to the medication that was prescribed (xartelto.) Pt states bumps or blisters behind the legs. Do not feel any during triage.

## 2023-04-15 DIAGNOSIS — M791 Myalgia, unspecified site: Secondary | ICD-10-CM | POA: Diagnosis not present

## 2023-04-15 DIAGNOSIS — M47816 Spondylosis without myelopathy or radiculopathy, lumbar region: Secondary | ICD-10-CM | POA: Diagnosis not present

## 2023-05-05 DIAGNOSIS — E7849 Other hyperlipidemia: Secondary | ICD-10-CM | POA: Diagnosis not present

## 2023-05-05 DIAGNOSIS — Z6832 Body mass index (BMI) 32.0-32.9, adult: Secondary | ICD-10-CM | POA: Diagnosis not present

## 2023-05-05 DIAGNOSIS — E782 Mixed hyperlipidemia: Secondary | ICD-10-CM | POA: Diagnosis not present

## 2023-05-05 DIAGNOSIS — I10 Essential (primary) hypertension: Secondary | ICD-10-CM | POA: Diagnosis not present

## 2023-05-05 DIAGNOSIS — E876 Hypokalemia: Secondary | ICD-10-CM | POA: Diagnosis not present

## 2023-05-06 ENCOUNTER — Encounter: Payer: Self-pay | Admitting: Urology

## 2023-05-06 ENCOUNTER — Ambulatory Visit: Payer: Medicare HMO | Admitting: Urology

## 2023-05-06 VITALS — BP 159/88 | HR 91

## 2023-05-06 DIAGNOSIS — N390 Urinary tract infection, site not specified: Secondary | ICD-10-CM | POA: Diagnosis not present

## 2023-05-06 DIAGNOSIS — N3281 Overactive bladder: Secondary | ICD-10-CM

## 2023-05-06 DIAGNOSIS — Z87440 Personal history of urinary (tract) infections: Secondary | ICD-10-CM | POA: Diagnosis not present

## 2023-05-06 LAB — MICROSCOPIC EXAMINATION: WBC, UA: >30 /HPF — AB (ref 0–5)

## 2023-05-06 LAB — URINALYSIS, ROUTINE W REFLEX MICROSCOPIC
Bilirubin, UA: NEGATIVE
Glucose, UA: NEGATIVE
Ketones, UA: NEGATIVE
Nitrite, UA: POSITIVE — AB
Specific Gravity, UA: 1.025 (ref 1.005–1.030)
Urobilinogen, Ur: 0.2 mg/dL (ref 0.2–1.0)
pH, UA: 6 (ref 5.0–7.5)

## 2023-05-06 MED ORDER — AMOXICILLIN-POT CLAVULANATE 875-125 MG PO TABS: 1.0000 | ORAL_TABLET | Freq: Two times a day (BID) | ORAL | 5 refills | Status: DC

## 2023-05-06 MED ORDER — NITROFURANTOIN MACROCRYSTAL 50 MG PO CAPS: 50.0000 mg | ORAL_CAPSULE | Freq: Every day | ORAL | 11 refills | Status: DC

## 2023-05-06 MED ORDER — MIRABEGRON ER 25 MG PO TB24: 25.0000 mg | ORAL_TABLET | Freq: Every day | ORAL | 11 refills | Status: DC

## 2023-05-06 NOTE — Patient Instructions (Signed)

## 2023-05-06 NOTE — Progress Notes (Signed)
 05/06/2023 10:19 AM   Sylvia Barnett 1945-03-26 [id number]  Referring provider: Orest Bio, MD [address] [place],  [place] [address]  Followup UTi and OAB   HPI: Sylvia Barnett is a 77yo here for followup for OAb and recurrent UTI. She had 2 UTIs since the last visit. She is on macrobid  every other day. Culture results are not available. She noted worsening urinary urgency and frequency with the UTI. She takes mirabegorn 25mg  prn for her OAb symptoms.    PMH: Past Medical History:  Diagnosis Date   Anxiety    Since in her 20's   Arthritis 2013   Right hip   Cancer [facility]) 2004    Breast Lumpectomy   Complication of anesthesia    Has a small throat   Depression    Started when she was in her 20's   Hypercholesteremia 2012   Hypertension 2013   IBS (irritable bowel syndrome) 2018   per GI note   Incontinence-urinary 2012   Lobular carcinoma in situ (LCIS) of left breast 09/08/2008   Melanoma of back [facility]) 1994   Excision with clear margins   Microscopic colitis 2015   Resolved 2016    Surgical History: Past Surgical History:  Procedure Laterality Date   ABDOMINAL HYSTERECTOMY  2002   APPENDECTOMY  1949   BIOPSY  02/26/2018   Procedure: BIOPSY;  Surgeon: Ruby Corporal, MD;  Location: [facility] ENDO [facility]ITE;  Service: Endoscopy;;  gastric   BIOPSY BREAST  2010   Left   Bladder Tack  2002   BREAST LUMPECTOMY  2004   Left   BREAST LUMPECTOMY  2004   Left   BREAST LUMPECTOMY  2006   Left   BREAST LUMPECTOMY  2000   Left   BREAST LUMPECTOMY WITH RADIOACTIVE SEED AND SENTINEL LYMPH NODE BIOPSY Left 09/11/2017   Procedure: LEFT BREAST LUMPECTOMY X2 WITH RADIOACTIVE SEED AND LEFT AXILLARY SENTINEL LYMPH NODE BIOPSY;  Surgeon: Ayesha Lente, MD;  Location: [facility] OR;  Service: General;  Laterality: Left;   BUNIONECTOMY  1994   Right   CATARACT EXTRACTION W/PHACO Left 08/18/2013   Procedure: CATARACT EXTRACTION PHACO AND INTRAOCULAR LENS PLACEMENT (IOC);  Surgeon:  Anner Kill, MD;  Location: [facility] ORS;  Service: Ophthalmology;  Laterality: Left;  CDE: 8.62   COLONOSCOPY N/A 11/10/2013   Procedure: COLONOSCOPY;  Surgeon: Ruby Corporal, MD;  Location: [facility] END[facility];  Service: Endoscopy;  Laterality: N/A;  240   COLONOSCOPY N/A 12/30/2018   Procedure: COLONOSCOPY;  Surgeon: Ruby Corporal, MD;  Location: [facility];  Service: Endoscopy;  Laterality: N/A;  12   ESOPHAGOGASTRODUODENOSCOPY N/A 02/26/2018   Procedure: ESOPHAGOGASTRODUODENOSCOPY (EGD);  Surgeon: Ruby Corporal, MD;  Location: [facility];  Service: Endoscopy;  Laterality: N/A;  1055   EYE SURGERY  2011   Catarct Right   EYE SURGERY  08-2011   Right Yag procedure   JOINT REPLACEMENT  08-2010   Left total knee   KNEE ARTHROSCOPY  2010   Right   KNEE ARTHROSCOPY  2003   Left   POLYPECTOMY  12/30/2018   Procedure: POLYPECTOMY;  Surgeon: Ruby Corporal, MD;  Location: [facility];  Service: Endoscopy;;  hepatic flexure,transverse colon     PORTACATH PLACEMENT N/A 09/11/2017   Procedure: INSERTION PORT-A-CATH;  Surgeon: Ayesha Lente, MD;  Location: [facility] OR;  Service: General;  Laterality: N/A;   TOTAL KNEE ARTHROPLASTY  11/03/2011   Procedure: TOTAL  KNEE ARTHROPLASTY;  Surgeon: Aurther Blue, MD;  Location: [facility] ORS;  Service: Orthopedics;  Laterality: Right;   TUBAL LIGATION     WRIST SURGERY Bilateral 03-2010   WRIST SURGERY  2008   Right    Home Medications:  Allergies as of 05/06/2023       Reactions   Fiorinal-codeine #3 [butalbital-asa-caff-codeine] Rash   Lansoprazole Swelling   Latex Swelling, Other (See Comments), Itching, Rash   blisters   Butalbital-aspirin-caffeine Rash   Sulfa Antibiotics Rash   Doxycycline  Diarrhea   Oxycodone -acetaminophen  Other (See Comments)   Femara  [letrozole ] Rash   Rash when tried in 1984   Sulfasalazine Rash        Medication List        Accurate as of May 06, 2023 10:19 AM. If you have any questions, ask your nurse or  doctor.          amoxicillin -clavulanate 875-125 MG tablet Commonly known as: AUGMENTIN  Take 1 tablet by mouth every 12 (twelve) hours.   amoxicillin -clavulanate 875-125 MG tablet Commonly known as: AUGMENTIN  Take 1 tablet by mouth 2 (two) times daily.   atorvastatin  20 MG tablet Commonly known as: LIPITOR Take 20 mg by mouth daily.   buPROPion 150 MG 24 hr tablet Commonly known as: WELLBUTRIN XL Take 1 tablet by mouth every morning.   calcium -vitamin D 500-200 MG-UNIT Tabs tablet Commonly known as: OSCAL WITH D Take by mouth.   cephALEXin  250 MG capsule Commonly known as: KEFLEX  Take 250 mg by mouth 3 (three) times daily.   chlorhexidine  0.12 % solution Commonly known as: PERIDEX  SMARTSIG:0.5 By Mouth Twice Daily   escitalopram 5 MG tablet Commonly known as: LEXAPRO Take 5 mg by mouth at bedtime.   fluconazole  150 MG tablet Commonly known as: DIFLUCAN  Take 1 tablet (150 mg total) by mouth daily.   hydrochlorothiazide  25 MG tablet Commonly known as: HYDRODIURIL  Take 25 mg by mouth daily.   hydroxypropyl methylcellulose / hypromellose 2.5 % ophthalmic solution Commonly known as: ISOPTO TEARS / GONIOVISC Place 1 drop into both eyes 3 (three) times daily as needed for dry eyes.   IMODIUM  PO Take 2 mg by mouth daily.   ketoconazole 2 % cream Commonly known as: NIZORAL   meloxicam 15 MG tablet Commonly known as: MOBIC Take 15 mg by mouth daily as needed.   Menthol  (Topical Analgesic) 5 % Pads Apply 1 patch topically daily as needed (pain).   mirabegron  ER 50 MG Tb24 tablet Commonly known as: MYRBETRIQ  Take 1 tablet (50 mg total) by mouth daily.   mirabegron  ER 25 MG Tb24 tablet Commonly known as: MYRBETRIQ  Take 1 tablet (25 mg total) by mouth daily.   nitrofurantoin  (macrocrystal-monohydrate) 100 MG capsule Commonly known as: MACROBID  Take 1 capsule (100 mg total) by mouth every other day.   nitrofurantoin  (macrocrystal-monohydrate) 100 MG  capsule Commonly known as: MACROBID  Take 1 capsule (100 mg total) by mouth 2 (two) times daily.   predniSONE 10 MG (21) Tbpk tablet Commonly known as: STERAPRED UNI-PAK 21 TAB Take 10 mg by mouth as directed. Dose pack   Rivaroxaban  Starter Pack (15 mg and 20 mg) Commonly known as: XARELTO  STARTER PACK Follow package directions: Take one 15mg  tablet by mouth twice a day. On day 22, switch to one 20mg  tablet once a day. Take with food.   tiZANidine 4 MG tablet Commonly known as: ZANAFLEX Take 2 mg by mouth 3 (three) times daily as needed.   traMADol  50 MG tablet Commonly  known as: ULTRAM  Take 1 tablet (50 mg total) by mouth every 6 (six) hours as needed.   vitamin C  1000 MG tablet Take 1 tablet (1,000 mg total) by mouth daily.        Allergies:  Allergies  Allergen Reactions   Fiorinal-Codeine #3 [Butalbital-Asa-Caff-Codeine] Rash   Lansoprazole Swelling   Latex Swelling, Other (See Comments), Itching and Rash    blisters   Butalbital-Aspirin-Caffeine Rash   Sulfa Antibiotics Rash   Doxycycline  Diarrhea   Oxycodone -Acetaminophen  Other (See Comments)   Femara  [Letrozole ] Rash    Rash when tried in 1984   Sulfasalazine Rash    Family History: Family History  Problem Relation Age of Onset   Breast cancer Sister 25   Lymphoma Maternal Aunt     Social History:  reports that she has never smoked. She has never used smokeless tobacco. She reports that she does not drink alcohol  and does not use drugs.  ROS: All other review of systems were reviewed and are negative except what is noted above in HPI  Physical Exam: BP (!) 159/88   Pulse 91   Constitutional:  Alert and oriented, No acute distress. HEENT: [place] AT, moist mucus membranes.  Trachea midline, no masses. Cardiovascular: No clubbing, cyanosis, or edema. Respiratory: Normal respiratory effort, no increased work of breathing. GI: Abdomen is soft, nontender, nondistended, no abdominal masses GU: No CVA  tenderness.  Lymph: No cervical or inguinal lymphadenopathy. Skin: No rashes, bruises or suspicious lesions. Neurologic: Grossly intact, no focal deficits, moving all 4 extremities. Psychiatric: Normal mood and affect.  Laboratory Data: Lab Results  Component Value Date   WBC 5.8 10/25/2018   HGB 13.3 10/25/2018   HCT 39.3 10/25/2018   MCV 92.3 10/25/2018   PLT 155 10/25/2018    Lab Results  Component Value Date   CREATININE 0.87 10/25/2018    No results found for: "PSA"  No results found for: "TESTOSTERONE"  No results found for: "HGBA1C"  Urinalysis    Component Value Date/Time   COLORURINE YELLOW 12/03/2018 1731   APPEARANCEUR Clear 11/05/2022 0937   LABSPEC 1.018 12/03/2018 1731   PHURINE 5.0 12/03/2018 1731   GLUCOSEU Negative 11/05/2022 0937   HGBUR SMALL (A) 12/03/2018 1731   BILIRUBINUR Negative 11/05/2022 0937   KETONESUR NEGATIVE 12/03/2018 1731   PROTEINUR Negative 11/05/2022 0937   PROTEINUR NEGATIVE 12/03/2018 1731   UROBILINOGEN negative (A) 06/03/2019 1106   UROBILINOGEN 0.2 10/28/2011 1315   NITRITE Negative 11/05/2022 0937   NITRITE POSITIVE (A) 12/03/2018 1731   LEUKOCYTESUR 1+ (A) 11/05/2022 0937   LEUKOCYTESUR MODERATE (A) 12/03/2018 1731    Lab Results  Component Value Date   LABMICR See below: 11/05/2022   WBCUA 6-10 (A) 11/05/2022   LABEPIT >10 (A) 11/05/2022   BACTERIA Few (A) 11/05/2022    Pertinent Imaging:  No results found for this or any previous visit.  Results for orders placed during the hospital encounter of 04/06/23  US  Venous Img Lower Bilateral (DVT)  Narrative CLINICAL DATA:  leg swelling  EXAM: BILATERAL LOWER EXTREMITY VENOUS DOPPLER ULTRASOUND  TECHNIQUE: Gray-scale sonography with graded compression, as well as color Doppler and duplex ultrasound were performed to evaluate the lower extremity deep venous systems from the level of the common femoral vein and including the common femoral, femoral,  profunda femoral, popliteal and calf veins including the posterior tibial, peroneal and gastrocnemius veins when visible. The superficial great saphenous vein was also interrogated. Spectral Doppler was utilized to evaluate flow at  rest and with distal augmentation maneuvers in the common femoral, femoral and popliteal veins.  COMPARISON:  MR hip and pelvis, 12/25/2022.  FINDINGS: RIGHT LOWER EXTREMITY  VENOUS  Normal compressibility of the RIGHT common femoral, profunda femoral and great saphenous veins.  Heterogeneous-hypoechoic near-occlusive/occlusive filling defect in the imaged portions of the RIGHT femoral, popliteal and visualized portions of the calf veins.  OTHER  No evidence of superficial thrombophlebitis or abnormal fluid collection.  Limitations: none  LEFT LOWER EXTREMITY  VENOUS  Normal compressibility of the LEFT common femoral, superficial femoral, and popliteal veins, as well as the visualized calf veins. Visualized portions of profunda femoral vein and great saphenous vein unremarkable. No filling defects to suggest DVT on grayscale or color Doppler imaging. Doppler waveforms show normal direction of venous flow, normal respiratory plasticity and response to augmentation.  OTHER  No evidence of superficial thrombophlebitis or abnormal fluid collection.  Limitations: none  IMPRESSION: 1. Acute, occlusive RIGHT femoropopliteal DVT. 2. No evidence of femoropopliteal DVT or superficial thrombophlebitis within the LEFT lower extremity.  Art Largo, MD  [facility]  [facility]   Electronically Signed By: Art Largo M.D. On: 04/06/2023 14:40  No results found for this or any previous visit.  No results found for this or any previous visit.  Results for orders placed during the hospital encounter of 12/14/17  US  RENAL  Narrative CLINICAL DATA:  Possible cyst in the right  ovary  EXAM: RENAL / URINARY TRACT ULTRASOUND COMPLETE  COMPARISON:  Limited views of the kidneys from a cardiac research MRI dated October 15, 2017.  FINDINGS: Right Kidney:  Renal measurements: 10.7 x 5.8 x 5 cm = volume: 162 mL. The renal cortical echotexture remains lower than that of the liver. There is moderate hydronephrosis.  Left Kidney:  Renal measurements: 12.3 x 5.9 x 5.4 cm = volume: 204 mL. There is no left-sided hydronephrosis. There is a large upper pole cyst measuring 7 x 6.1 x 7.3 cm. No internal echoes are observed. The cortical echotexture is similar to that on the right.  Bladder:  The partially distended urinary bladder is normal. There is a moderate-sized postvoid residual volume. No ureteral jets were observed.  IMPRESSION: Moderate right-sided hydronephrosis.  No left-sided hydronephrosis.  Large simple appearing exophytic left upper pole cyst measuring 7.3 cm in greatest dimension.  Moderate-sized postvoid residual urinary bladder volume.   Electronically Signed By: David  [place] M.D. On: 12/15/2017 11:36  No results found for this or any previous visit.  No results found for this or any previous visit.  No results found for this or any previous visit.   Assessment & Plan:    1. Recurrent UTI (Primary) Urine for culture Switch to macrodantin  50mg  qhs - Urinalysis, Routine w reflex microscopic  2. OAB (overactive bladder) -mirabegron  25mg  daily   No follow-ups on file.  Johnie Nailer, MD  [facility] [place]

## 2023-05-08 LAB — URINE CULTURE

## 2023-05-12 DIAGNOSIS — Z6831 Body mass index (BMI) 31.0-31.9, adult: Secondary | ICD-10-CM | POA: Diagnosis not present

## 2023-05-12 DIAGNOSIS — I82431 Acute embolism and thrombosis of right popliteal vein: Secondary | ICD-10-CM | POA: Diagnosis not present

## 2023-05-12 DIAGNOSIS — M545 Low back pain, unspecified: Secondary | ICD-10-CM | POA: Diagnosis not present

## 2023-05-12 DIAGNOSIS — E782 Mixed hyperlipidemia: Secondary | ICD-10-CM | POA: Diagnosis not present

## 2023-05-12 DIAGNOSIS — I10 Essential (primary) hypertension: Secondary | ICD-10-CM | POA: Diagnosis not present

## 2023-05-14 DIAGNOSIS — H43813 Vitreous degeneration, bilateral: Secondary | ICD-10-CM | POA: Diagnosis not present

## 2023-05-20 DIAGNOSIS — H43813 Vitreous degeneration, bilateral: Secondary | ICD-10-CM | POA: Diagnosis not present

## 2023-05-20 DIAGNOSIS — H43393 Other vitreous opacities, bilateral: Secondary | ICD-10-CM | POA: Diagnosis not present

## 2023-05-20 DIAGNOSIS — Z961 Presence of intraocular lens: Secondary | ICD-10-CM | POA: Diagnosis not present

## 2023-05-20 DIAGNOSIS — H04123 Dry eye syndrome of bilateral lacrimal glands: Secondary | ICD-10-CM | POA: Diagnosis not present

## 2023-05-21 DIAGNOSIS — I824Y3 Acute embolism and thrombosis of unspecified deep veins of proximal lower extremity, bilateral: Secondary | ICD-10-CM | POA: Diagnosis not present

## 2023-05-28 DIAGNOSIS — M79674 Pain in right toe(s): Secondary | ICD-10-CM | POA: Diagnosis not present

## 2023-05-28 DIAGNOSIS — M79675 Pain in left toe(s): Secondary | ICD-10-CM | POA: Diagnosis not present

## 2023-05-28 DIAGNOSIS — B351 Tinea unguium: Secondary | ICD-10-CM | POA: Diagnosis not present

## 2023-06-09 DIAGNOSIS — R739 Hyperglycemia, unspecified: Secondary | ICD-10-CM | POA: Diagnosis not present

## 2023-06-11 DIAGNOSIS — M47816 Spondylosis without myelopathy or radiculopathy, lumbar region: Secondary | ICD-10-CM | POA: Diagnosis not present

## 2023-06-16 DIAGNOSIS — E7849 Other hyperlipidemia: Secondary | ICD-10-CM | POA: Diagnosis not present

## 2023-06-16 DIAGNOSIS — E782 Mixed hyperlipidemia: Secondary | ICD-10-CM | POA: Diagnosis not present

## 2023-06-16 DIAGNOSIS — I10 Essential (primary) hypertension: Secondary | ICD-10-CM | POA: Diagnosis not present

## 2023-06-16 DIAGNOSIS — F411 Generalized anxiety disorder: Secondary | ICD-10-CM | POA: Diagnosis not present

## 2023-06-16 DIAGNOSIS — Z6830 Body mass index (BMI) 30.0-30.9, adult: Secondary | ICD-10-CM | POA: Diagnosis not present

## 2023-06-16 DIAGNOSIS — K580 Irritable bowel syndrome with diarrhea: Secondary | ICD-10-CM | POA: Diagnosis not present

## 2023-07-20 ENCOUNTER — Encounter (HOSPITAL_COMMUNITY): Payer: Self-pay

## 2023-07-20 ENCOUNTER — Emergency Department (HOSPITAL_COMMUNITY)
Admission: EM | Admit: 2023-07-20 | Discharge: 2023-07-20 | Disposition: A | Discharge status: Home / Self Care | Attending: Emergency Medicine | Admitting: Emergency Medicine

## 2023-07-20 ENCOUNTER — Other Ambulatory Visit: Payer: Self-pay

## 2023-07-20 DIAGNOSIS — Z91040 Latex allergy status: Secondary | ICD-10-CM | POA: Diagnosis not present

## 2023-07-20 DIAGNOSIS — M5459 Other low back pain: Secondary | ICD-10-CM | POA: Diagnosis not present

## 2023-07-20 DIAGNOSIS — Z853 Personal history of malignant neoplasm of breast: Secondary | ICD-10-CM | POA: Insufficient documentation

## 2023-07-20 DIAGNOSIS — Z7901 Long term (current) use of anticoagulants: Secondary | ICD-10-CM | POA: Diagnosis not present

## 2023-07-20 DIAGNOSIS — M545 Low back pain, unspecified: Secondary | ICD-10-CM | POA: Insufficient documentation

## 2023-07-20 MED ORDER — DIAZEPAM 10 MG PO TABS: 10.0000 mg | ORAL_TABLET | Freq: Two times a day (BID) | ORAL | 0 refills | Status: DC

## 2023-07-20 NOTE — ED Provider Triage Note (Signed)
 Emergency Medicine Provider Triage Evaluation Note  Sylvia Barnett , a 78 y.o. female  was evaluated in triage.  Pt complains of Back pain, history of arthritis, pinched nerve, was post to get an MRI today but was unable to complete.  Reports increased pain but no weakness, no loss control bladder, bowel, no recent fall or other injury.  Review of Systems  Positive: Back pain Negative: Weakness, numbness, loss of control of bladder / bowel  Physical Exam  BP (!) 184/105   Pulse 81   Temp 98.1 F (36.7 C)   Resp 16   Ht 5' 3 (1.6 m)   Wt 88 kg   SpO2 96%   BMI 34.37 kg/m  Gen:   Awake, no distress   Resp:  Normal effort  MSK:   Moves extremities without difficulty  Other:  Intact strength 5/5 of bilateral lower extremities  Medical Decision Making  Medically screening exam initiated at 3:02 PM.  Appropriate orders placed.  TANGIA PINARD was informed that the remainder of the evaluation will be completed by another provider, this initial triage assessment does not replace that evaluation, and the importance of remaining in the ED until their evaluation is complete.  Workup initiated in triage    Rosan Sherlean DEL, [place] 07/20/23 1502

## 2023-07-20 NOTE — ED Triage Notes (Signed)
 Pt to er, pt states that she has a hx of back pain, states that she was at her PMD and was trying to have an Mri, states that she couldn't lay on the table because of her pain. States that she is here for the pain.

## 2023-07-20 NOTE — Discharge Instructions (Addendum)
 Take 2 of your home tramadol  as well as one of the Valium  around 30 minutes prior to your MRI appointment tomorrow to help with pain.  Please return to the emergency department for further evaluation if you have sudden loss of control of your bladder, bowels, new sudden numbness.

## 2023-07-20 NOTE — ED Provider Notes (Signed)
 [place] EMERGENCY DEPARTMENT AT [facility] Provider Note   CSN: [id number] Arrival date & time: 07/20/23  1348     Patient presents with: Back Pain   Sylvia Barnett is a 78 y.o. female with past medical history significant for arthritis, history of breast cancer, GERD who presents from MRI appointment after inability to lay flat in bed.  She reports that she tried to complete MRI for chronic back pain ongoing for 2+ months but was unable to lay flat for the study.  They reported that she could go back in the morning if she had something like Valium  or pain medicine to help make her tolerate the MRI.  She has had no new numbness, she is able to ambulate, she denies any loss of control of bladder, bowels.  She denies any new fall or other injury.  No history of IV drug use.    Back Pain      Prior to Admission medications   Medication Sig Start Date End Date Taking? Authorizing Provider  diazepam  (VALIUM ) 10 MG tablet Take 1 tablet (10 mg total) by mouth 2 (two) times daily. Take 30 minutes prior to your MRI appointment 07/20/23  Yes Shanquita Ronning H, PA-C  amoxicillin -clavulanate (AUGMENTIN ) 875-125 MG tablet Take 1 tablet by mouth every 12 (twelve) hours. 12/31/21   McKenzie, Belvie CROME, MD  amoxicillin -clavulanate (AUGMENTIN ) 875-125 MG tablet Take 1 tablet by mouth 2 (two) times daily. 05/06/23   McKenzie, Belvie CROME, MD  Ascorbic Acid (VITAMIN C ) 1000 MG tablet Take 1 tablet (1,000 mg total) by mouth daily. Patient not taking: Reported on 05/09/2022 08/19/21   Gudena, Vinay, MD  atorvastatin  (LIPITOR) 20 MG tablet Take 20 mg by mouth daily. 12/08/22   [provider]  buPROPion (WELLBUTRIN XL) 150 MG 24 hr tablet Take 1 tablet by mouth every morning. 06/26/22 06/26/23  [provider]  calcium -vitamin D (OSCAL WITH D) 500-200 MG-UNIT TABS tablet Take by mouth. Patient not taking: Reported on 05/09/2022    [provider]  cephALEXin  (KEFLEX ) 250 MG  capsule Take 250 mg by mouth 3 (three) times daily. 04/09/23   [provider]  chlorhexidine  (PERIDEX ) 0.12 % solution SMARTSIG:0.5 By Mouth Twice Daily 08/18/22   [provider]  escitalopram (LEXAPRO) 5 MG tablet Take 5 mg by mouth at bedtime.     [provider]  fluconazole  (DIFLUCAN ) 150 MG tablet Take 1 tablet (150 mg total) by mouth daily. 02/03/22   McKenzie, Belvie CROME, MD  hydrochlorothiazide  (HYDRODIURIL ) 25 MG tablet Take 25 mg by mouth daily.    [provider]  hydroxypropyl methylcellulose / hypromellose (ISOPTO TEARS / GONIOVISC) 2.5 % ophthalmic solution Place 1 drop into both eyes 3 (three) times daily as needed for dry eyes.    [provider]  ketoconazole (NIZORAL) 2 % cream  06/02/19   [provider]  Loperamide  HCl (IMODIUM  PO) Take 2 mg by mouth daily.    [provider]  meloxicam (MOBIC) 15 MG tablet Take 15 mg by mouth daily as needed.    [provider]  Menthol , Topical Analgesic, 5 % PADS Apply 1 patch topically daily as needed (pain).    [provider]  mirabegron  ER (MYRBETRIQ ) 25 MG TB24 tablet Take 1 tablet (25 mg total) by mouth daily. 05/06/23   McKenzie, Belvie CROME, MD  mirabegron  ER (MYRBETRIQ ) 50 MG TB24 tablet Take 1 tablet (50 mg total) by mouth daily. 05/09/22   Sherrilee Belvie  L, MD  nitrofurantoin  (MACRODANTIN ) 50 MG capsule Take 1 capsule (50 mg total) by mouth at bedtime. 05/06/23   McKenzie, Belvie CROME, MD  nitrofurantoin , macrocrystal-monohydrate, (MACROBID ) 100 MG capsule Take 1 capsule (100 mg total) by mouth every other day. 11/05/22   McKenzie, Belvie CROME, MD  nitrofurantoin , macrocrystal-monohydrate, (MACROBID ) 100 MG capsule Take 1 capsule (100 mg total) by mouth 2 (two) times daily. 11/18/22   McKenzie, Belvie CROME, MD  predniSONE (STERAPRED UNI-PAK 21 TAB) 10 MG (21) TBPK tablet Take 10 mg by mouth as directed. Dose pack 03/23/23   [provider]  RIVAROXABAN   (XARELTO ) VTE STARTER PACK (15 & 20 MG) Follow package directions: Take one 15mg  tablet by mouth twice a day. On day 22, switch to one 20mg  tablet once a day. Take with food. 04/10/23   Zammit, Joseph, MD  tiZANidine (ZANAFLEX) 4 MG tablet Take 2 mg by mouth 3 (three) times daily as needed. 04/06/23   [provider]  traMADol  (ULTRAM ) 50 MG tablet Take 1 tablet (50 mg total) by mouth every 6 (six) hours as needed. 04/10/23   Suzette Pac, MD  prochlorperazine  (COMPAZINE ) 10 MG tablet Take 1 tablet (10 mg total) by mouth every 6 (six) hours as needed (Nausea or vomiting). 10/15/17 01/18/18  Odean Potts, MD    Allergies: Fiorinal-codeine #3 [butalbital-asa-caff-codeine], Lansoprazole, Latex, Butalbital-aspirin-caffeine, Sulfa antibiotics, Doxycycline , Oxycodone -acetaminophen , Femara  [letrozole ], and Sulfasalazine    Review of Systems  Musculoskeletal:  Positive for back pain.  All other systems reviewed and are negative.   Updated Vital Signs BP (!) 184/105   Pulse 81   Temp 98.1 F (36.7 C)   Resp 16   Ht 5' 3 (1.6 m)   Wt 88 kg   SpO2 96%   BMI 34.37 kg/m   Physical Exam Vitals and nursing note reviewed.  Constitutional:      General: She is not in acute distress.    Appearance: Normal appearance.  HENT:     Head: Normocephalic and atraumatic.  Eyes:     General:        Right eye: No discharge.        Left eye: No discharge.  Cardiovascular:     Rate and Rhythm: Normal rate and regular rhythm.     Pulses: Normal pulses.  Pulmonary:     Effort: Pulmonary effort is normal. No respiratory distress.  Musculoskeletal:        General: No deformity.     Comments: Focal tenderness to palpation midline lumbar spine, lumbar paraspinous muscles.  But she has intact strength 5/5 of bilateral lower extremities and is witnessed to ambulate without significant difficulty in the emergency department.  Skin:    General: Skin is warm and dry.     Capillary Refill: Capillary  refill takes less than 2 seconds.  Neurological:     Mental Status: She is alert and oriented to person, place, and time.  Psychiatric:        Mood and Affect: Mood normal.        Behavior: Behavior normal.     (all labs ordered are listed, but only abnormal results are displayed) Labs Reviewed - No data to display  EKG: None  Radiology: No results found.   Procedures   Medications Ordered in the ED - No data to display  Medical Decision Making  This is an overall well-appearing 78 year old female who presents with concern for acute on chronic back pain, inability to tolerate MRI at outside appointment.  She has no red flag signs for back pain, no history of IV drug use, no loss of control of bladder, bowels, no new numbness, no significant weakness or inability to walk.  She does report significant pain.  She contacted the MRI appointment and they reported that she could reschedule for the morning so long as she has some anxiety medication to help her tolerate laying flat in the MRI.  I think it is reasonable to discharge her with a short course of Valium  to take for her appointment.  Encouraged her to also take her home tramadol .  Discussed that these are sedating medications and she should use assistance such as a wheelchair with walking while taking them.  Patient discharged in stable condition, completely neurovascularly intact throughout, no emergent indication for MRI in the emergency department setting. Final diagnoses:  Lumbar back pain    ED Discharge Orders          Ordered    diazepam  (VALIUM ) 10 MG tablet  2 times daily        07/20/23 1527               Rosan Sherlean DEL, PA-C 07/20/23 1530    Tegeler, Lonni PARAS, MD 07/20/23 1729

## 2023-07-20 NOTE — ED Triage Notes (Signed)
 Pt states she is unable to walk d/t weakness, denies bowel/bladder incontinence.

## 2023-07-21 DIAGNOSIS — M545 Low back pain, unspecified: Secondary | ICD-10-CM | POA: Diagnosis not present

## 2023-07-29 DIAGNOSIS — M48061 Spinal stenosis, lumbar region without neurogenic claudication: Secondary | ICD-10-CM | POA: Diagnosis not present

## 2023-08-20 DIAGNOSIS — M79675 Pain in left toe(s): Secondary | ICD-10-CM | POA: Diagnosis not present

## 2023-08-20 DIAGNOSIS — B351 Tinea unguium: Secondary | ICD-10-CM | POA: Diagnosis not present

## 2023-08-20 DIAGNOSIS — M79674 Pain in right toe(s): Secondary | ICD-10-CM | POA: Diagnosis not present

## 2023-08-21 DIAGNOSIS — M5416 Radiculopathy, lumbar region: Secondary | ICD-10-CM | POA: Diagnosis not present

## 2023-08-25 ENCOUNTER — Inpatient Hospital Stay: Attending: Hematology and Oncology | Admitting: Hematology and Oncology

## 2023-08-25 VITALS — BP 161/85 | HR 79 | Temp 98.7°F | Resp 17 | Ht 63.0 in | Wt 187.6 lb

## 2023-08-25 DIAGNOSIS — N390 Urinary tract infection, site not specified: Secondary | ICD-10-CM

## 2023-08-25 DIAGNOSIS — Z79811 Long term (current) use of aromatase inhibitors: Secondary | ICD-10-CM | POA: Diagnosis not present

## 2023-08-25 DIAGNOSIS — Z87440 Personal history of urinary (tract) infections: Secondary | ICD-10-CM | POA: Insufficient documentation

## 2023-08-25 DIAGNOSIS — C50412 Malignant neoplasm of upper-outer quadrant of left female breast: Secondary | ICD-10-CM | POA: Diagnosis present

## 2023-08-25 DIAGNOSIS — Z170 Estrogen receptor positive status [ER+]: Secondary | ICD-10-CM | POA: Insufficient documentation

## 2023-08-25 MED ORDER — NITROFURANTOIN MACROCRYSTAL 50 MG PO CAPS: 50.0000 mg | ORAL_CAPSULE | Freq: Every day | ORAL | Status: DC

## 2023-08-25 MED ORDER — NYSTATIN 100000 UNIT/GM EX POWD: 1.0000 | Freq: Three times a day (TID) | CUTANEOUS | 1 refills | Status: AC

## 2023-08-25 NOTE — Assessment & Plan Note (Signed)
 09/11/2017:Left lumpectomy: Grade 2 invasive lobular cancer, 1.1 cm, LCIS, margins negative, 0/2 lymph nodes negative, ER 90% strong staining, PR negative, HER-2 positive ratio 2.62, Ki-67 1%, T1c N0 stage Ia   Recommendation: 1. Adjuvant therapy with Taxol  Herceptin  weekly x12 followed by Herceptin  maintenance for 1 year completed October 2020 2.  Adjuvant radiation completed 03/23/2018 3.  Followed by adjuvant antiestrogen therapy started 05/03/2018 ------------------------------------------------------------------ Current treatment: antiestrogen therapy with anastrozole  1 mg daily 05/03/2018 tried letrozole  but couldn't tolerate it, Recommended Exemestane (patient did not want to try it) Chemo-induced peripheral neuropathy: Stable to improving   Breast Cancer Surveillance: 1. Breast Exam: 08/25/2023 Benign 2. Mammogram: 03/17/2023: Benign Density Cat B [name]) 3. Bone Density: 11/11/2021: I do not have the report.  Currently on Ca and Vit D 4.  Bone scan 05/13/2019: No evidence of bone metastases   Return to clinic in 1 year for follow-up with long-term survivorship

## 2023-08-25 NOTE — Progress Notes (Signed)
 Patient Care Team: Atilano Deward ORN, MD as PCP - General (Unknown Physician Specialty) Odean Potts, MD as Consulting Physician (Hematology and Oncology) Shannon Agent, MD as Consulting Physician (Radiation Oncology) Jakie Jon HERO, RN (Inactive) as Registered Nurse  DIAGNOSIS:  Encounter Diagnoses  Name Primary?   Malignant neoplasm of upper-outer quadrant of left breast in female, estrogen receptor positive (HCC) Yes   Recurrent UTI     SUMMARY OF ONCOLOGIC HISTORY: Oncology History  Malignant neoplasm of upper-outer quadrant of left breast in female, estrogen receptor positive (HCC)  08/05/2017 Initial Diagnosis   Screening detected left breast calcifications UOQ 1.1 cm biopsy revealed invasive lobular cancer with LCIS and CSL, grade 2, ER 90%, PR 0%, Ki-67 1%, HER-2 positive ratio 2.62, T2N0 stage Ia clinical stage AJCC 8   08/12/2017 Cancer Staging   Staging form: Breast, AJCC 8th Edition - Clinical stage from 08/12/2017: Stage IA (cT1c, cN0, cM0, G2, ER+, PR-, HER2+) - Signed by Odean Potts, MD on 08/12/2017   09/11/2017 Surgery   Left lumpectomy: Grade 2 invasive lobular cancer, 1.1 cm, LCIS, margins negative, 0/2 lymph nodes negative, ER 90% strong staining, PR negative, HER-2 positive ratio 2.62, Ki-67 1%, T1c N0 stage Ia   09/23/2017 Cancer Staging   Staging form: Breast, AJCC 8th Edition - Pathologic: Stage IA (pT1c, pN0, cM0, G2, ER+, PR-, HER2+) - Signed by Crawford Morna Pickle, NP on 09/23/2017   11/02/2017 -  Chemotherapy   Adjuvant therapy with Taxol  Herceptin  weekly x12 followed by Herceptin  maintenance for 1 year   02/25/2018 - 03/23/2018 Radiation Therapy   Adjuvant Radiation in [place], [place] [facility]).  Left Breast 42.56 Gy   05/03/2018 -  Anti-estrogen oral therapy   Anastrozole , 1mg  daily (plan for 7 years), discontinued 02/24/19 due to joint aches and pains     CHIEF COMPLIANT: Diagnosis of DVT in the right leg March 2025  HISTORY OF PRESENT  ILLNESS:  History of Present Illness Sylvia Barnett is an 78 year old female who presents for follow-up regarding a blood clot in her right leg.  She has a blood clot in her right leg, diagnosed in March after a long car trip, with significant swelling in both feet and the clot located behind her right knee. She initially started on Xarelto  but switched to Eliquis in March and is currently taking 5 mg twice daily. She is concerned about the hematologist's statement that the clot would never go away.  She experiences ongoing swelling in her legs, though it has decreased recently. She also notes blood changes in her feet, which she attributes to a previous procedure involving an epidural needle in her ankle. She has a history of a previous blood clot approximately 10 to 20 years ago, though she does not recall the details of the treatment at that time.     ALLERGIES:  is allergic to fiorinal-codeine #3 [butalbital-asa-caff-codeine], lansoprazole, latex, butalbital-aspirin-caffeine, sulfa antibiotics, doxycycline , oxycodone -acetaminophen , femara  [letrozole ], and sulfasalazine.  MEDICATIONS:  Current Outpatient Medications  Medication Sig Dispense Refill   chlorhexidine  (PERIDEX ) 0.12 % solution SMARTSIG:0.5 By Mouth Twice Daily     ELIQUIS 5 MG TABS tablet Take 5 mg by mouth 2 (two) times daily.     escitalopram (LEXAPRO) 5 MG tablet Take 5 mg by mouth at bedtime.      hydrochlorothiazide  (HYDRODIURIL ) 25 MG tablet Take 25 mg by mouth daily.     hydroxypropyl methylcellulose / hypromellose (ISOPTO TEARS / GONIOVISC) 2.5 % ophthalmic solution Place 1 drop  into both eyes 3 (three) times daily as needed for dry eyes.     Loperamide  HCl (IMODIUM  PO) Take 2 mg by mouth daily.     Menthol , Topical Analgesic, 5 % PADS Apply 1 patch topically daily as needed (pain).     nystatin  (MYCOSTATIN /NYSTOP ) powder Apply 1 Application topically 3 (three) times daily. 30 g 1   tiZANidine (ZANAFLEX) 4 MG  tablet Take 2 mg by mouth 3 (three) times daily as needed.     traMADol  (ULTRAM ) 50 MG tablet Take 1 tablet (50 mg total) by mouth every 6 (six) hours as needed. 15 tablet 0   atorvastatin  (LIPITOR) 20 MG tablet Take 20 mg by mouth daily.     nitrofurantoin  (MACRODANTIN ) 50 MG capsule Take 1 capsule (50 mg total) by mouth at bedtime.     No current facility-administered medications for this visit.    PHYSICAL EXAMINATION: ECOG PERFORMANCE STATUS: 1 - Symptomatic but completely ambulatory  Vitals:   08/25/23 1511  BP: (!) 161/85  Pulse: 79  Resp: 17  Temp: 98.7 F (37.1 C)  SpO2: 97%   Filed Weights   08/25/23 1511  Weight: 187 lb 9.6 oz (85.1 kg)    Physical Exam Mild to moderate bilateral lower extremity swelling with venous prominence  (exam performed in the presence of a chaperone)  LABORATORY DATA:  I have reviewed the data as listed    Latest Ref Rng & Units 10/25/2018    8:55 AM 09/06/2018    8:26 AM 07/26/2018    9:10 AM  CMP  Glucose 70 - 99 mg/dL 899  880  889   BUN 8 - 23 mg/dL 18  23  21    Creatinine 0.44 - 1.00 mg/dL 9.12  9.17  9.04   Sodium 135 - 145 mmol/L 145  144  145   Potassium 3.5 - 5.1 mmol/L 3.7  3.8  3.7   Chloride 98 - 111 mmol/L 105  109  110   CO2 22 - 32 mmol/L 28  25  26    Calcium  8.9 - 10.3 mg/dL 9.4  9.0  8.8   Total Protein 6.5 - 8.1 g/dL 6.8  6.4  6.6   Total Bilirubin 0.3 - 1.2 mg/dL 0.5  0.4  0.3   Alkaline Phos 38 - 126 U/L 92  93  88   AST 15 - 41 U/L 13  15  16    ALT 0 - 44 U/L 15  20  17      Lab Results  Component Value Date   WBC 5.8 10/25/2018   HGB 13.3 10/25/2018   HCT 39.3 10/25/2018   MCV 92.3 10/25/2018   PLT 155 10/25/2018   NEUTROABS 4.0 10/25/2018    ASSESSMENT & PLAN:  Malignant neoplasm of upper-outer quadrant of left breast in female, estrogen receptor positive (HCC) 09/11/2017:Left lumpectomy: Grade 2 invasive lobular cancer, 1.1 cm, LCIS, margins negative, 0/2 lymph nodes negative, ER 90% strong  staining, PR negative, HER-2 positive ratio 2.62, Ki-67 1%, T1c N0 stage Ia   Recommendation: 1. Adjuvant therapy with Taxol  Herceptin  weekly x12 followed by Herceptin  maintenance for 1 year completed October 2020 2.  Adjuvant radiation completed 03/23/2018 3.  Followed by adjuvant antiestrogen therapy started 05/03/2018 ------------------------------------------------------------------ Current treatment: antiestrogen therapy with anastrozole  1 mg daily 05/03/2018 tried letrozole  but couldn't tolerate it, Recommended Exemestane (patient did not want to try it) Chemo-induced peripheral neuropathy: Stable to improving   Breast Cancer Surveillance: 1. Mammogram: 03/17/2023: Benign Density Cat B (  [facility]) 2. Bone Density: 11/11/2021: I do not have the report.  Currently on Ca and Vit D 3 Bone scan 05/13/2019: No evidence of bone metastases   DVT right lower extremity: Provoked DVT because of 10-hour car ride in March 2025. I did not recommend hypercoagulability testing. She is currently on Eliquis 5 mg p.o. twice daily and tolerating it well.  We would like to obtain ultrasound of the lower extremity and if it is negative then she could discontinue  anticoagulation. Telephone call after the ultrasound to discuss results.  ------------------------------------- Assessment and Plan Assessment & Plan Chronic right lower extremity deep vein thrombosis with lower extremity edema Chronic DVT in the right leg with bilateral edema, likely provoked by travel. Current treatment with Eliquis for five months. Previous hematologist suggested permanence, but most clots resolve. - Order ultrasound of the right leg in September to assess DVT status. - Continue Eliquis until ultrasound results are available. - Advise wearing compression socks during travel and taking breaks every two hours. - Discuss potential discontinuation of blood thinners if ultrasound is clear.  History of malignant neoplasm of  upper-outer quadrant of left breast, female Six years post-breast cancer diagnosis with no current oncological concerns. Rash under breast may be intertrigo. - Prescribe nystatin  powder for rash under the breast.  Intertrigo under breast Red rash under the breast, likely intertrigo. Previous treatments ineffective. - Prescribe nystatin  powder for rash under the breast.  Chronic back pain with lumbar radiculopathy and osteoarthritis Severe pain due to protruding vertebra pressing on a nerve. - Receive epidural for back pain.  Stage 1 osteoporosis Contributing to back pain and vertebral issues. - Encourage calcium  and vitamin D supplementation, considering stomach issues.  Hypertension Elevated since chemotherapy and radiation. High readings at home, associated with pain. - Monitor blood pressure at home.  Recurrent urinary tract infections Possibly related to bacterial colonization rather than anatomical issues. - Continue nitrofurantoin  nightly as prophylaxis.      Orders Placed This Encounter  Procedures   US  Venous Img Lower Unilateral Right    [place] Pain in right lower leg,  Evaluate for resolution of DVT    Standing Status:   Standing    Number of Occurrences:   1    Symptom/Reason for Exam:   Pain of right lower leg [[id number]]   The patient has a good understanding of the overall plan. she agrees with it. she will call with any problems that may develop before the next visit here. Total time spent: 30 mins including face to face time and time spent for planning, charting and co-ordination of care   Viinay K Laydon Martis, MD 08/25/23

## 2023-08-26 ENCOUNTER — Other Ambulatory Visit (HOSPITAL_COMMUNITY): Payer: Self-pay | Admitting: *Deleted

## 2023-08-26 ENCOUNTER — Other Ambulatory Visit: Payer: Self-pay | Admitting: *Deleted

## 2023-08-26 ENCOUNTER — Telehealth: Payer: Self-pay | Admitting: *Deleted

## 2023-08-26 DIAGNOSIS — Z170 Estrogen receptor positive status [ER+]: Secondary | ICD-10-CM

## 2023-08-26 DIAGNOSIS — C50412 Malignant neoplasm of upper-outer quadrant of left female breast: Secondary | ICD-10-CM

## 2023-08-26 DIAGNOSIS — M79604 Pain in right leg: Secondary | ICD-10-CM

## 2023-08-26 NOTE — Telephone Encounter (Signed)
 Medication Prior Authorization Status  Processed CoverMyMeds KEY: [id number] for nYSTATIN  10,OOO Units/GM powder  Sent Today  Per [place] electronic PA  PA Case ID: [id number]    Effective ?  through ?SABRA

## 2023-08-31 ENCOUNTER — Ambulatory Visit: Payer: Medicare HMO | Admitting: Hematology and Oncology

## 2023-09-07 DIAGNOSIS — M5416 Radiculopathy, lumbar region: Secondary | ICD-10-CM | POA: Diagnosis not present

## 2023-09-10 ENCOUNTER — Other Ambulatory Visit

## 2023-09-11 ENCOUNTER — Other Ambulatory Visit: Payer: Self-pay

## 2023-09-11 ENCOUNTER — Telehealth: Payer: Self-pay

## 2023-09-11 ENCOUNTER — Other Ambulatory Visit

## 2023-09-11 ENCOUNTER — Telehealth: Payer: Self-pay | Admitting: Urology

## 2023-09-11 DIAGNOSIS — N390 Urinary tract infection, site not specified: Secondary | ICD-10-CM | POA: Diagnosis not present

## 2023-09-11 LAB — URINALYSIS, ROUTINE W REFLEX MICROSCOPIC
Bilirubin, UA: NEGATIVE
Glucose, UA: NEGATIVE
Ketones, UA: NEGATIVE
Leukocytes,UA: NEGATIVE
Nitrite, UA: NEGATIVE
Protein,UA: NEGATIVE
RBC, UA: NEGATIVE
Specific Gravity, UA: 1.025 (ref 1.005–1.030)
Urobilinogen, Ur: 0.2 mg/dL (ref 0.2–1.0)
pH, UA: 6 (ref 5.0–7.5)

## 2023-09-11 NOTE — Telephone Encounter (Signed)
 Pt scheduled for urine drop off Urologic History:  Any Recent Urologic Surgeries or Procedures:no Recurrent UTI's:yes Cystitis: no  Prostatitis:no Kidney or Bladder Stones: no Plan: Walk-in Clinic: no Appointment w/Physician: [no Lab visit scheduled for urine drop off: Yes Advice given:  Do you take on daily medications for UTI suppression Yes   nitrofurantoin  (MACRODANTIN ) 50 MG capsule  daily at bedtime

## 2023-09-11 NOTE — Telephone Encounter (Signed)
 Patient presents today with complaints of  UTI.  UA and Culture done today.  Dr. Sherrilee reviewed results and no treatment is needed. Patient aware of MD recommendations and that we will reach out with culture results.      [email or web address], CMA

## 2023-09-11 NOTE — Telephone Encounter (Signed)
 Patient wants a refill of nitrofurantoin  (MACRODANTIN )  100 MG capsule called into [facility] in [facility]  Dysuria  Patient called with c/o dysuria x 2-3 days days.  Pain:  none  Severity: 0  Associated Sign Symptoms:  Fever: no Chills: no Hematuria: no Urgency: yes going every 2 hours Frequency: yes Hesitancy:no Incontinence: no Nausea: no Vomiting: no   Urine is cloudy and she tested her urine with test strips and it shows she has a bacteria infection in her urine    Message sent to clinic staff to return call to patient to advise of plan.

## 2023-09-16 LAB — URINE CULTURE

## 2023-09-17 ENCOUNTER — Ambulatory Visit: Payer: Self-pay

## 2023-09-17 ENCOUNTER — Telehealth: Payer: Self-pay

## 2023-09-17 MED ORDER — DOXYCYCLINE HYCLATE 100 MG PO CAPS: 100.0000 mg | ORAL_CAPSULE | Freq: Two times a day (BID) | ORAL | 0 refills | Status: DC

## 2023-09-17 NOTE — Telephone Encounter (Signed)
 Patient called with no answer. Detailed message left and my chart message sent.

## 2023-09-18 ENCOUNTER — Telehealth: Payer: Self-pay | Admitting: Urology

## 2023-09-18 NOTE — Telephone Encounter (Signed)
 Please call in something else patient is allergic to the Doxycycline .  She uses [facility]

## 2023-09-18 NOTE — Telephone Encounter (Signed)
 Called pt and advised her due to culture sensitivity the only abx she can take is doxycycline , she was also informed that she is not allergic to the abx just has a intolerance pt was advised to eat with abx and pick up some imodium  to help with diarrhea caused by abx pt stated she already has diarrhea and knows the abx will cause more pt was readvised to eat with the abx and take some imodium  to help deal w/ side affect pt voiced her understanding

## 2023-09-22 DIAGNOSIS — M4726 Other spondylosis with radiculopathy, lumbar region: Secondary | ICD-10-CM | POA: Diagnosis not present

## 2023-09-25 ENCOUNTER — Ambulatory Visit (HOSPITAL_COMMUNITY)
Admission: RE | Admit: 2023-09-25 | Discharge: 2023-09-25 | Disposition: A | Discharge status: Home / Self Care | Source: Ambulatory Visit | Attending: Hematology and Oncology | Admitting: Hematology and Oncology

## 2023-09-25 DIAGNOSIS — M79604 Pain in right leg: Secondary | ICD-10-CM | POA: Diagnosis not present

## 2023-09-25 DIAGNOSIS — I82411 Acute embolism and thrombosis of right femoral vein: Secondary | ICD-10-CM | POA: Diagnosis not present

## 2023-09-25 DIAGNOSIS — I82431 Acute embolism and thrombosis of right popliteal vein: Secondary | ICD-10-CM | POA: Diagnosis not present

## 2023-09-28 ENCOUNTER — Inpatient Hospital Stay: Attending: Hematology and Oncology | Admitting: Hematology and Oncology

## 2023-09-28 DIAGNOSIS — M4726 Other spondylosis with radiculopathy, lumbar region: Secondary | ICD-10-CM | POA: Diagnosis not present

## 2023-09-28 DIAGNOSIS — C50412 Malignant neoplasm of upper-outer quadrant of left female breast: Secondary | ICD-10-CM

## 2023-09-28 DIAGNOSIS — Z86718 Personal history of other venous thrombosis and embolism: Secondary | ICD-10-CM | POA: Diagnosis not present

## 2023-09-28 DIAGNOSIS — Z09 Encounter for follow-up examination after completed treatment for conditions other than malignant neoplasm: Secondary | ICD-10-CM

## 2023-09-28 DIAGNOSIS — Z170 Estrogen receptor positive status [ER+]: Secondary | ICD-10-CM | POA: Diagnosis not present

## 2023-09-28 NOTE — Assessment & Plan Note (Signed)
 09/11/2017:Left lumpectomy: Grade 2 invasive lobular cancer, 1.1 cm, LCIS, margins negative, 0/2 lymph nodes negative, ER 90% strong staining, PR negative, HER-2 positive ratio 2.62, Ki-67 1%, T1c N0 stage Ia   Recommendation: 1. Adjuvant therapy with Taxol  Herceptin  weekly x12 followed by Herceptin  maintenance for 1 year completed October 2020 2.  Adjuvant radiation completed 03/23/2018 3.  Followed by adjuvant antiestrogen therapy started 05/03/2018 ------------------------------------------------------------------ Current treatment: antiestrogen therapy with anastrozole  1 mg daily 05/03/2018 tried letrozole  but couldn't tolerate it, Recommended Exemestane (patient did not want to try it) Chemo-induced peripheral neuropathy: Stable to improving   Breast Cancer Surveillance: 1. Mammogram: 03/17/2023: Benign Density Cat B [facility]) 2. Bone Density: 11/11/2021: I do not have the report.  Currently on Ca and Vit D 3 Bone scan 05/13/2019: No evidence of bone metastases   DVT right lower extremity: Provoked DVT because of 10-hour car ride in March 2025. She is currently on Eliquis 5 mg p.o. twice daily and tolerating it well. Ultrasound right lower extremity 09/25/2023: Nonocclusive thrombus mid femoral vein improved, partial recannulization, close of thrombus within the popliteal vein Based on these results I recommend continuation of anticoagulation.  Recheck with ultrasound every 6 months

## 2023-09-28 NOTE — Progress Notes (Signed)
 HEMATOLOGY-ONCOLOGY TELEPHONE VISIT PROGRESS NOTE  I connected with our patient on 09/28/23 at  8:30 AM EDT by telephone and verified that I am speaking with the correct person using two identifiers.  I discussed the limitations, risks, security and privacy concerns of performing an evaluation and management service by telephone and the availability of in person appointments.  I also discussed with the patient that there may be a patient responsible charge related to this service. The patient expressed understanding and agreed to proceed.   History of Present Illness:   History of Present Illness Sylvia Barnett is an 78 year old female who presents for evaluation of her right lower extremity deep vein thrombosis (DVT).  She has a history of deep vein thrombosis in the right lower extremity and experiences swelling in the right leg.  She is on Eliquis for anticoagulation but is concerned about the cost, with a copay of $290 per month. She has been receiving free samples for the past three months but is uncertain about the continuation of this support. She previously tried Xarelto , which also had a high copay, and is considering discussing alternative options with her insurance provider.    Oncology History  Malignant neoplasm of upper-outer quadrant of left breast in female, estrogen receptor positive (HCC)  08/05/2017 Initial Diagnosis   Screening detected left breast calcifications UOQ 1.1 cm biopsy revealed invasive lobular cancer with LCIS and CSL, grade 2, ER 90%, PR 0%, Ki-67 1%, HER-2 positive ratio 2.62, T2N0 stage Ia clinical stage AJCC 8   08/12/2017 Cancer Staging   Staging form: Breast, AJCC 8th Edition - Clinical stage from 08/12/2017: Stage IA (cT1c, cN0, cM0, G2, ER+, PR-, HER2+) - Signed by Odean Potts, MD on 08/12/2017   09/11/2017 Surgery   Left lumpectomy: Grade 2 invasive lobular cancer, 1.1 cm, LCIS, margins negative, 0/2 lymph nodes negative, ER 90% strong  staining, PR negative, HER-2 positive ratio 2.62, Ki-67 1%, T1c N0 stage Ia   09/23/2017 Cancer Staging   Staging form: Breast, AJCC 8th Edition - Pathologic: Stage IA (pT1c, pN0, cM0, G2, ER+, PR-, HER2+) - Signed by Crawford Morna Pickle, NP on 09/23/2017   11/02/2017 -  Chemotherapy   Adjuvant therapy with Taxol  Herceptin  weekly x12 followed by Herceptin  maintenance for 1 year   02/25/2018 - 03/23/2018 Radiation Therapy   Adjuvant Radiation in [place], [place]).  Left Breast 42.56 Gy   05/03/2018 -  Anti-estrogen oral therapy   Anastrozole , 1mg  daily (plan for 7 years), discontinued 02/24/19 due to joint aches and pains     REVIEW OF SYSTEMS:   Constitutional: Denies fevers, chills or abnormal weight loss All other systems were reviewed with the patient and are negative. Observations/Objective:     Assessment Plan:  Malignant neoplasm of upper-outer quadrant of left breast in female, estrogen receptor positive (HCC) 09/11/2017:Left lumpectomy: Grade 2 invasive lobular cancer, 1.1 cm, LCIS, margins negative, 0/2 lymph nodes negative, ER 90% strong staining, PR negative, HER-2 positive ratio 2.62, Ki-67 1%, T1c N0 stage Ia   Recommendation: 1. Adjuvant therapy with Taxol  Herceptin  weekly x12 followed by Herceptin  maintenance for 1 year completed October 2020 2.  Adjuvant radiation completed 03/23/2018 3.  Followed by adjuvant antiestrogen therapy started 05/03/2018 ------------------------------------------------------------------ Current treatment: antiestrogen therapy with anastrozole  1 mg daily 05/03/2018 tried letrozole  but couldn't tolerate it, Recommended Exemestane (patient did not want to try it) Chemo-induced peripheral neuropathy: Stable to improving   Breast Cancer Surveillance: 1. Mammogram: 03/17/2023: Benign Density Cat B (  [facility]) 2. Bone Density: 11/11/2021: I do not have the report.  Currently on Ca and Vit D 3 Bone scan 05/13/2019: No evidence of bone  metastases   DVT right lower extremity: Provoked DVT because of 10-hour car ride in March 2025. She is currently on Eliquis 5 mg p.o. twice daily and tolerating it well. Ultrasound right lower extremity 09/25/2023: Nonocclusive thrombus mid femoral vein improved, partial recannulization, close of thrombus within the popliteal vein Based on these results I recommend continuation of anticoagulation.  Patient tells me that Eliquis co-pay once a month is $290.  I discussed with her about Pradaxa.  She will find out from her insurance about the cost of that.  Recheck with ultrasound every 6 months and telephone visit 2 days later   I discussed the assessment and treatment plan with the patient. The patient was provided an opportunity to ask questions and all were answered. The patient agreed with the plan and demonstrated an understanding of the instructions. The patient was advised to call back or seek an in-person evaluation if the symptoms worsen or if the condition fails to improve as anticipated.   I provided 20 minutes of non-face-to-face time during this encounter.  This includes time for charting and coordination of care   Naomi MARLA Chad, MD

## 2023-10-01 DIAGNOSIS — M4726 Other spondylosis with radiculopathy, lumbar region: Secondary | ICD-10-CM | POA: Diagnosis not present

## 2023-10-05 DIAGNOSIS — M4726 Other spondylosis with radiculopathy, lumbar region: Secondary | ICD-10-CM | POA: Diagnosis not present

## 2023-10-07 DIAGNOSIS — M4726 Other spondylosis with radiculopathy, lumbar region: Secondary | ICD-10-CM | POA: Diagnosis not present

## 2023-10-13 DIAGNOSIS — M4726 Other spondylosis with radiculopathy, lumbar region: Secondary | ICD-10-CM | POA: Diagnosis not present

## 2023-10-15 DIAGNOSIS — M4726 Other spondylosis with radiculopathy, lumbar region: Secondary | ICD-10-CM | POA: Diagnosis not present

## 2023-10-20 DIAGNOSIS — M4726 Other spondylosis with radiculopathy, lumbar region: Secondary | ICD-10-CM | POA: Diagnosis not present

## 2023-10-22 DIAGNOSIS — M4726 Other spondylosis with radiculopathy, lumbar region: Secondary | ICD-10-CM | POA: Diagnosis not present

## 2023-10-26 DIAGNOSIS — Z6831 Body mass index (BMI) 31.0-31.9, adult: Secondary | ICD-10-CM | POA: Diagnosis not present

## 2023-10-26 DIAGNOSIS — B349 Viral infection, unspecified: Secondary | ICD-10-CM | POA: Diagnosis not present

## 2023-10-26 DIAGNOSIS — Z20828 Contact with and (suspected) exposure to other viral communicable diseases: Secondary | ICD-10-CM | POA: Diagnosis not present

## 2023-10-28 ENCOUNTER — Encounter (INDEPENDENT_AMBULATORY_CARE_PROVIDER_SITE_OTHER): Payer: Self-pay | Admitting: Gastroenterology

## 2023-11-03 DIAGNOSIS — E876 Hypokalemia: Secondary | ICD-10-CM | POA: Diagnosis not present

## 2023-11-03 DIAGNOSIS — E7849 Other hyperlipidemia: Secondary | ICD-10-CM | POA: Diagnosis not present

## 2023-11-03 DIAGNOSIS — R739 Hyperglycemia, unspecified: Secondary | ICD-10-CM | POA: Diagnosis not present

## 2023-11-03 DIAGNOSIS — M4726 Other spondylosis with radiculopathy, lumbar region: Secondary | ICD-10-CM | POA: Diagnosis not present

## 2023-11-06 ENCOUNTER — Ambulatory Visit: Admitting: Urology

## 2023-11-06 ENCOUNTER — Telehealth: Payer: Self-pay

## 2023-11-06 DIAGNOSIS — C50412 Malignant neoplasm of upper-outer quadrant of left female breast: Secondary | ICD-10-CM

## 2023-11-06 DIAGNOSIS — M4726 Other spondylosis with radiculopathy, lumbar region: Secondary | ICD-10-CM | POA: Diagnosis not present

## 2023-11-06 NOTE — Telephone Encounter (Signed)
 W[facility] [id number] Understanding and Predicting Breast Cancer Events after Treatment (UPBEAT)  Study Follow-up - Year 6   This CRC attempted to contact Ms. Sylvia Barnett by phone regarding the Year 6 follow-up for the above study. Patient was not available. A voice message was left along with the call back number.   Abelardo Jock Clinical Research Coordinator [phone] 11/06/2023 10:25 AM

## 2023-11-07 DIAGNOSIS — M549 Dorsalgia, unspecified: Secondary | ICD-10-CM | POA: Diagnosis not present

## 2023-11-09 ENCOUNTER — Other Ambulatory Visit: Payer: Self-pay | Admitting: Urology

## 2023-11-09 DIAGNOSIS — Z87440 Personal history of urinary (tract) infections: Secondary | ICD-10-CM

## 2023-11-10 ENCOUNTER — Telehealth: Payer: Self-pay

## 2023-11-10 DIAGNOSIS — Z170 Estrogen receptor positive status [ER+]: Secondary | ICD-10-CM

## 2023-11-10 NOTE — Telephone Encounter (Signed)
 WF [id number] Understanding and Predicting Breast Cancer Events after Treatment (UPBEAT)  Study Follow-up - Year 6   Ms. Giarrusso was contacted by phone regarding the Year 6 follow-up for the above study. Patient reported doing well and denied any hospitalizations since the last follow-up call on 11/18/2022. She also denied experiencing any cardiac events, including myocardial infarction (MI), percutaneous coronary intervention (PCI), coronary artery bypass grafting (CABG), heart catheterization, cerebrovascular accident (CVA), or a diagnosis of heart failure since the last contact.   Patient confirmed she did not complete the H&R BLOCK questionnaire online. She completed the KCCQ-12 through the phone call today.  Patient confirmed that her email address remains the same. She was informed that the next follow-up call will be in approximately one year.     Patient stated she has no questions at this time and she knows to contact the research team with any study-related concerns in the future. Patient was thanked for her time and for her continued participation in the study.   Abelardo Jock Clinical Research Coordinator [phone] 11/10/2023 11:19 AM

## 2023-11-11 DIAGNOSIS — I10 Essential (primary) hypertension: Secondary | ICD-10-CM | POA: Diagnosis not present

## 2023-11-11 DIAGNOSIS — R300 Dysuria: Secondary | ICD-10-CM | POA: Diagnosis not present

## 2023-11-11 DIAGNOSIS — Z6831 Body mass index (BMI) 31.0-31.9, adult: Secondary | ICD-10-CM | POA: Diagnosis not present

## 2023-11-11 DIAGNOSIS — M549 Dorsalgia, unspecified: Secondary | ICD-10-CM | POA: Diagnosis not present

## 2023-11-11 DIAGNOSIS — N390 Urinary tract infection, site not specified: Secondary | ICD-10-CM | POA: Diagnosis not present

## 2023-11-11 DIAGNOSIS — I82431 Acute embolism and thrombosis of right popliteal vein: Secondary | ICD-10-CM | POA: Diagnosis not present

## 2023-11-11 DIAGNOSIS — K580 Irritable bowel syndrome with diarrhea: Secondary | ICD-10-CM | POA: Diagnosis not present

## 2023-11-11 DIAGNOSIS — F411 Generalized anxiety disorder: Secondary | ICD-10-CM | POA: Diagnosis not present

## 2023-11-13 ENCOUNTER — Telehealth: Payer: Self-pay

## 2023-11-13 NOTE — Telephone Encounter (Signed)
 Spoke with patient regarding insurance forms related to her ultrasound for blood clots, ordered by the provider on 09/12. Patient was advised to send the necessary form for completion. Awaiting receipt of documentation to proceed.

## 2023-11-18 NOTE — Telephone Encounter (Signed)
 Received completed form from patient on 11/05. Documented accordingly and faxed to the number provided on the form. A physical copy was also mailed to ensure proper receipt and to fulfill the patient's request.

## 2023-11-24 ENCOUNTER — Telehealth: Payer: Self-pay

## 2023-11-24 NOTE — Telephone Encounter (Signed)
 LVM for pt regarding her Cancer Claim being completed,faxed, and confirmation received.Pt Copy was mailed to her address as requested.

## 2023-12-01 ENCOUNTER — Encounter (INDEPENDENT_AMBULATORY_CARE_PROVIDER_SITE_OTHER): Payer: Self-pay | Admitting: *Deleted

## 2023-12-15 ENCOUNTER — Telehealth (INDEPENDENT_AMBULATORY_CARE_PROVIDER_SITE_OTHER): Payer: Self-pay

## 2023-12-15 ENCOUNTER — Ambulatory Visit (INDEPENDENT_AMBULATORY_CARE_PROVIDER_SITE_OTHER): Admitting: Gastroenterology

## 2023-12-15 ENCOUNTER — Encounter (INDEPENDENT_AMBULATORY_CARE_PROVIDER_SITE_OTHER): Payer: Self-pay | Admitting: Gastroenterology

## 2023-12-15 VITALS — BP 125/75 | HR 73 | Temp 97.2°F | Ht 63.0 in | Wt 191.9 lb

## 2023-12-15 DIAGNOSIS — K52832 Lymphocytic colitis: Secondary | ICD-10-CM

## 2023-12-15 DIAGNOSIS — K635 Polyp of colon: Secondary | ICD-10-CM | POA: Diagnosis not present

## 2023-12-15 DIAGNOSIS — R197 Diarrhea, unspecified: Secondary | ICD-10-CM | POA: Diagnosis not present

## 2023-12-15 DIAGNOSIS — R151 Fecal smearing: Secondary | ICD-10-CM | POA: Diagnosis not present

## 2023-12-15 DIAGNOSIS — K580 Irritable bowel syndrome with diarrhea: Secondary | ICD-10-CM

## 2023-12-15 DIAGNOSIS — Z800 Family history of malignant neoplasm of digestive organs: Secondary | ICD-10-CM

## 2023-12-15 MED ORDER — CHOLESTYRAMINE 4 G PO PACK: 4.0000 g | PACK | Freq: Every day | ORAL | 5 refills | Status: DC

## 2023-12-15 MED ORDER — PSYLLIUM 58.6 % PO PACK: 1.0000 | PACK | Freq: Two times a day (BID) | ORAL | 2 refills | Status: AC

## 2023-12-15 NOTE — Progress Notes (Signed)
 Drucella Karbowski Faizan Shayana Hornstein , M.D. Gastroenterology & Hepatology [facility] [address] [place], [place] [address] Primary Care Physician: Verneda Charmaine SAUNDERS, FNP [address] [place] [address]  Chief Complaint: Fecal incontinence and diarrhea  History of Present Illness: Sylvia Barnett is a 78 y.o. female with history of history of lymphocytic colitis , IBS , history of breast cancer, DVT on Eliquis who presents for evaluation of Fecal incontinence and diarrhea  Patient was last seen by Dr. Golda in 2021.  Patient was using Pepto-Bismol as needed and Imodium  and/dicyclomine  for management of lymphocytic colitis and IBS-D at that time  Patient reports she has diarrhea for a while at least for the past 5 years but recently she started having bowel incontinence.  She would have 3-6 bowel movements daily especially if she does not take Imodium  The patient denies having any nausea, vomiting, fever, chills, hematochezia, melena, hematemesis, abdominal distention, abdominal pain,  jaundice, pruritus or weight loss.  Last ZHI:wnwz Last Colonoscopy:2020 - Two 5 to 6 mm polyps in the transverse colon and at the hepatic flexure, removed with a cold snare. Resected and retrieved. - Diverticulosis in the sigmoid colon.  Her last colonoscopy was in December 2020. She had 2 small tubular adenomas removed.   FH: Family history is significant for CRC in sister who had APR in her late 28s and died 10 years later at age 73 of lymphoma.   Past Medical History: Past Medical History:  Diagnosis Date   Anxiety    Since in her 20's   Arthritis 2013   Right hip   Cancer (HCC) 2004    Breast Lumpectomy   Complication of anesthesia    Has a small throat   Depression    Started when she was in her 20's   Hypercholesteremia 2012   Hypertension 2013   IBS (irritable bowel syndrome) 2018   per GI note   Incontinence-urinary 2012   Lobular  carcinoma in situ (LCIS) of left breast 09/08/2008   Melanoma of back [facility]) 1994   Excision with clear margins   Microscopic colitis 2015   Resolved 2016    Past Surgical History: Past Surgical History:  Procedure Laterality Date   ABDOMINAL HYSTERECTOMY  2002   APPENDECTOMY  1949   BIOPSY  02/26/2018   Procedure: BIOPSY;  Surgeon: Golda Claudis PENNER, MD;  [facility];  Service: Endoscopy;;  gastric   BIOPSY BREAST  2010   Left   Bladder Tack  2002   BREAST LUMPECTOMY  2004   Left   BREAST LUMPECTOMY  2004   Left   BREAST LUMPECTOMY  2006   Left   BREAST LUMPECTOMY  2000   Left   BREAST LUMPECTOMY WITH RADIOACTIVE SEED AND SENTINEL LYMPH NODE BIOPSY Left 09/11/2017   Procedure: LEFT BREAST LUMPECTOMY X2 WITH RADIOACTIVE SEED AND LEFT AXILLARY SENTINEL LYMPH NODE BIOPSY;  Surgeon: Mikell Katz, MD;  Location: [facility] OR;  Service: General;  Laterality: Left;   BUNIONECTOMY  1994   Right   CATARACT EXTRACTION W/PHACO Left 08/18/2013   Procedure: CATARACT EXTRACTION PHACO AND INTRAOCULAR LENS PLACEMENT (IOC);  Surgeon: Cherene Mania, MD;  Location: [facility] OR[facility];  Service: Ophthalmology;  Laterality: Left;  CDE: 8.62   COLONOSCOPY N/A 11/10/2013   Procedure: COLONOSCOPY;  Surgeon: Claudis PENNER Golda, MD;  [facility];  Service: Endoscopy;  Laterality: N/A;  240   COLONOSCOPY N/A 12/30/2018   Procedure:  COLONOSCOPY;  Surgeon: Golda Claudis PENNER, MD;  [facility];  Service: Endoscopy;  Laterality: N/A;  12   ESOPHAGOGASTRODUODENOSCOPY N/A 02/26/2018   Procedure: ESOPHAGOGASTRODUODENOSCOPY (EGD);  Surgeon: Golda Claudis PENNER, MD;  [facility];  Service: Endoscopy;  Laterality: N/A;  1055   EYE SURGERY  2011   Catarct Right   EYE SURGERY  08-2011   Right Yag procedure   JOINT REPLACEMENT  08-2010   Left total knee   KNEE ARTHROSCOPY  2010   Right   KNEE ARTHROSCOPY  2003   Left   POLYPECTOMY  12/30/2018   Procedure: POLYPECTOMY;  Surgeon: Golda Claudis PENNER, MD;  [facility];  Service: Endoscopy;;  hepatic flexure,transverse colon     PORTACATH PLACEMENT N/A 09/11/2017   Procedure: INSERTION PORT-A-CATH;  Surgeon: Mikell Katz, MD;  Location: [facility];  Service: General;  Laterality: N/A;   TOTAL KNEE ARTHROPLASTY  11/03/2011   Procedure: TOTAL KNEE ARTHROPLASTY;  Surgeon: Dempsey LULLA Moan, MD;  Location: [facility];  Service: Orthopedics;  Laterality: Right;   TUBAL LIGATION     WRIST SURGERY Bilateral 03-2010   WRIST SURGERY  2008   Right    Family History: Family History  Problem Relation Age of Onset   Breast cancer Sister 22   Lymphoma Maternal Aunt     Social History: Social History   Tobacco Use  Smoking Status Never  Smokeless Tobacco Never   Social History   Substance and Sexual Activity  Alcohol  Use No   Alcohol /week: 0.0 standard drinks of alcohol    Social History   Substance and Sexual Activity  Drug Use No    Allergies: Allergies  Allergen Reactions   Fiorinal-Codeine #3 [Butalbital-Asa-Caff-Codeine] Rash   Lansoprazole Swelling   Latex Swelling, Other (See Comments), Itching and Rash    blisters   Butalbital-Aspirin-Caffeine Rash   Sulfa Antibiotics Rash   Doxycycline  Diarrhea   Oxycodone -Acetaminophen  Other (See Comments)   Femara  [Letrozole ] Rash    Rash when tried in 1984   Sulfasalazine Rash    Medications: Current Outpatient Medications  Medication Sig Dispense Refill   atorvastatin  (LIPITOR) 20 MG tablet Take 20 mg by mouth daily.     cholestyramine  (QUESTRAN ) 4 g packet Take 1 packet (4 g total) by mouth daily. Mix with 4-6 oz liquid.  Take other meds 1 hr before or 4-6 hr after cholestyramine . 30 packet 5   ELIQUIS 5 MG TABS tablet Take 5 mg by mouth 2 (two) times daily.     escitalopram (LEXAPRO) 5 MG tablet Take 5 mg by mouth at bedtime.      hydrochlorothiazide  (HYDRODIURIL ) 25 MG tablet Take 25 mg by mouth daily.     hydroxypropyl methylcellulose / hypromellose (ISOPTO  TEARS / GONIOVISC) 2.5 % ophthalmic solution Place 1 drop into both eyes 3 (three) times daily as needed for dry eyes.     Loperamide  HCl (IMODIUM  PO) Take 2 mg by mouth daily.     Menthol , Topical Analgesic, 5 % PADS Apply 1 patch topically daily as needed (pain).     nystatin  (MYCOSTATIN /NYSTOP ) powder Apply 1 Application topically 3 (three) times daily. 30 g 1   psyllium (METAMUCIL) 58.6 % packet Take 1 packet by mouth 2 (two) times daily. 60 packet 2   tiZANidine (ZANAFLEX) 4 MG tablet Take 2 mg by mouth 3 (three) times daily as needed.     traMADol  (ULTRAM ) 50 MG tablet Take 1 tablet (50 mg total) by mouth  every 6 (six) hours as needed. 15 tablet 0   No current facility-administered medications for this visit.    Review of Systems: GENERAL: negative for malaise, night sweats HEENT: No changes in hearing or vision, no nose bleeds or other nasal problems. NECK: Negative for lumps, goiter, pain and significant neck swelling RESPIRATORY: Negative for cough, wheezing CARDIOVASCULAR: Negative for chest pain, leg swelling, palpitations, orthopnea GI: SEE HPI MUSCULOSKELETAL: Negative for joint pain or swelling, back pain, and muscle pain. SKIN: Negative for lesions, rash HEMATOLOGY Negative for prolonged bleeding, bruising easily, and swollen nodes. ENDOCRINE: Negative for cold or heat intolerance, polyuria, polydipsia and goiter. NEURO: negative for tremor, gait imbalance, syncope and seizures. The remainder of the review of systems is noncontributory.   Physical Exam: BP (!) 159/73   Pulse 73   Temp (!) 97.2 F (36.2 C)   Ht 5' 3 (1.6 m)   Wt 191 lb 14.2 oz (87 kg)   BMI 33.99 kg/m  GENERAL: The patient is AO x3, in no acute distress. HEENT: Head is normocephalic and atraumatic. EOMI are intact. Mouth is well hydrated and without lesions. NECK: Supple. No masses LUNGS: Clear to auscultation. No presence of rhonchi/wheezing/rales. Adequate chest expansion HEART: RRR, normal s1  and s2. ABDOMEN: Soft, nontender, no guarding, no peritoneal signs, and nondistended. BS +. No masses.  Imaging/Labs: as above     Latest Ref Rng & Units 10/25/2018    8:55 AM 09/06/2018    8:19 AM 07/26/2018    9:10 AM  CBC  WBC 4.0 - 10.5 K/uL 5.8  4.7  6.2   Hemoglobin 12.0 - 15.0 g/dL 86.6  87.0  87.1   Hematocrit 36.0 - 46.0 % 39.3  39.0  38.8   Platelets 150 - 400 K/uL 155  167  166    No results found for: IRON, TIBC, FERRITIN  I personally reviewed and interpreted the available labs, imaging and endoscopic files.  Impression and Plan:  Sylvia Barnett is a 78 y.o. female with history of history of lymphocytic colitis , IBS , history of breast cancer, DVT on Eliquis who presents for evaluation of Fecal incontinence and diarrhea  #Chronic diarrhea #Colon polyps  Patient is known to have lymphocytic colitis and this could be relapse of lymphocytic colitis  This could be underlying IBS-D or bile acid diarrhea as patient has history of cholecystectomy  Active lymphocytic colitis treatment remains removing offending agents(PPI, SSRI, NSAIDs) .    Since patient continues to be symptomatic we will obtain colonoscopy with biopsies to evaluate if this is active lymphocytic colitis, and if so we will treat with budesonide  as patient is having 2-3 liquid BM daily which is an indication for treatment   For now we will add Metamucil twice daily and cholestyramine  Will obtain clearance to hold Eliquis if patient can for 2 days, can continue aspirin  Patient also has history of colon adenomas last colonoscopy 2020 suggest repeat was 5 years and as patient is due now.  Patient has family history of colon cancer  I thoroughly discussed with the patient the procedure, including the risks involved. Patient understands what the procedure involves including the benefits and any risks. Patient understands alternatives to the proposed procedure. Risks including (but not limited to)  bleeding, tearing of the lining (perforation), rupture of adjacent organs, problems with heart and lung function, infection, and medication reactions. A small percentage of complications may require surgery, hospitalization, repeat endoscopic procedure, and/or transfusion.  Patient understood and  agreed.   All questions were answered.      Jayjay Littles Faizan Cameryn Schum, MD Gastroenterology and Hepatology [facility]   This chart has been completed using [facility] Dictation software, and while attempts have been made to ensure accuracy , certain words and phrases may not be transcribed as intended

## 2023-12-15 NOTE — Telephone Encounter (Signed)
 Clearance faxed on 12/2 to [phone]

## 2023-12-15 NOTE — Patient Instructions (Signed)
 It was very nice to meet you today, as dicussed with will plan for the following :  1) colonoscopy   2) metamucil or fiber twice daily  3) Cholestyramine  2-4 hours away from all medications

## 2023-12-15 NOTE — Telephone Encounter (Signed)
    12/15/23  YUNA PIZZOLATO 1945-02-05  What type of surgery is being performed? colonoscopy  When is surgery scheduled? To be determined  What type of clearance is required (medical or pharmacy to hold medication or both? medication  Are there any medications that need to be held prior to surgery and how long? Eliquis, hold 2 days prior  Name of physician performing surgery?  Dr. Cinderella Rouse [facility]enterology at [facility] Phone: [phone] Fax: [phone]  Anethesia type (none, local, MAC, general)? MAC     ? Yes ? No Patient can hold medication as requested   Signature: ___________________________

## 2023-12-16 ENCOUNTER — Telehealth (INDEPENDENT_AMBULATORY_CARE_PROVIDER_SITE_OTHER): Payer: Self-pay | Admitting: Gastroenterology

## 2023-12-16 NOTE — Telephone Encounter (Signed)
 Pt contacted and verbalized understanding.

## 2023-12-16 NOTE — Telephone Encounter (Signed)
 Left message to return call

## 2023-12-16 NOTE — Telephone Encounter (Signed)
 Pt left voicemail stating that pharmacy said the Questran  that was prescribed yesterday would interfere with blood pressure medication. Pt wanted to make sure if she is unable to take Questran , can she take Metamucil alone. Please advise. Thank you

## 2023-12-16 NOTE — Telephone Encounter (Signed)
 Yes she can take metamucil alone for now

## 2023-12-28 ENCOUNTER — Telehealth (INDEPENDENT_AMBULATORY_CARE_PROVIDER_SITE_OTHER): Payer: Self-pay | Admitting: Gastroenterology

## 2023-12-28 ENCOUNTER — Encounter (INDEPENDENT_AMBULATORY_CARE_PROVIDER_SITE_OTHER): Payer: Self-pay

## 2023-12-28 NOTE — Telephone Encounter (Signed)
 Pt saw Dr Cinderella recently and has questions about a medication he put her on. I transferred call to Dr Orion nurse. [phone]

## 2023-12-28 NOTE — Telephone Encounter (Signed)
 Pt left voicemail stating she is wanting to know why she is on Cholestyramine . States this is for high cholesterol and kidneys. Pt states she doesn't have high cholesterol or kidney issues. She wanting to know the purpose and that its not working, she is still having diarrhea and bowel accidents. Returned call to patient. Advised pt that Cholestyramine  is for diarrhea. Pt states she has cut back to taking it every other day. When she was taking it daily, she would feel bloated and Cholestyramine  would give her gas. Pt states she is taking a fiber tablet. Please advise. Thank you  (I asked pt about the telephone call from 12/3-pharmacy stated it would interfere with blood pressure med and if she could take Metamucil alone. Provider stated she could take Metamucil alone)

## 2023-12-28 NOTE — Telephone Encounter (Signed)
 Mychart message sent to patient.

## 2023-12-28 NOTE — Telephone Encounter (Signed)
 Cholestyramine  was for diarrhea. She can stop taking it as she is unable to tolerate it . For now continue to take Metamucil twice daily and schedule colonoscopy to assess for lymphocytic colitis as discussed in the clinic

## 2023-12-30 NOTE — Telephone Encounter (Signed)
Left detailed message on pt voicemail (OK per DPR)

## 2024-01-05 NOTE — Telephone Encounter (Signed)
 Recent clearance on 01/04/2025 to [id number]

## 2024-01-12 ENCOUNTER — Telehealth: Payer: Self-pay

## 2024-01-12 MED ORDER — PEG 3350-KCL-NA BICARB-NACL 420 G PO SOLR: 4000.0000 mL | Freq: Once | ORAL | 0 refills | Status: AC

## 2024-01-12 NOTE — Telephone Encounter (Signed)
 Spoke with patient, scheduled TCS for 01/22/2024 at 11:45am. Rx sent to pharmacy. Instructions sent as mychart message and mailed to patient.

## 2024-01-12 NOTE — Telephone Encounter (Signed)
 Pt called regarding upcoming colonoscopy she will be having at [facility] at [facility] [place] location) w/ Dr. Cinderella. Their office needs permission for her to go off of eloquis 2 days prior to the procedure and when to restart the eloquis. RN called GI office and left VM w/ nurse line asking for them to call back in order to fax us  the form to be filled out.

## 2024-01-12 NOTE — Telephone Encounter (Signed)
 Received clearance from [facility] at 3:28pm. Getting scanned into media.

## 2024-01-12 NOTE — Telephone Encounter (Signed)
 PA on [employer] for TCS: Information about your requested care Prior authorization is not required for this code.

## 2024-01-12 NOTE — Telephone Encounter (Signed)
 Faxed medication clearance again on 01/12/2024 to [phone]

## 2024-01-12 NOTE — Telephone Encounter (Signed)
 Spoke to the receptionist at [facility] and she stated that she received the fax and is giving the medication clearance to the provider now.

## 2024-01-12 NOTE — Addendum Note (Signed)
 Addended by: DALLIE LIONEL RAMAN on: 01/12/2024 03:49 PM   Modules accepted: Orders

## 2024-01-19 ENCOUNTER — Telehealth: Payer: Self-pay | Admitting: *Deleted

## 2024-01-19 NOTE — Telephone Encounter (Signed)
 Received call from pt requesting advice from MD regarding Eliquis and upcoming colonoscopy.  Per MD pt to hold Eliquis 2 days prior and resume day after.  Pt educated and verbalized understanding. RN also attempt x1 to contact Dr. Deatrice Dine office, no answer LVM regarding pt instructions with Eliquis.

## 2024-01-22 ENCOUNTER — Ambulatory Visit (HOSPITAL_COMMUNITY): Admitting: Anesthesiology

## 2024-01-22 ENCOUNTER — Other Ambulatory Visit: Payer: Self-pay

## 2024-01-22 ENCOUNTER — Ambulatory Visit (HOSPITAL_COMMUNITY)
Admission: RE | Admit: 2024-01-22 | Discharge: 2024-01-22 | Disposition: A | Discharge status: Home / Self Care | Attending: Gastroenterology | Admitting: Gastroenterology

## 2024-01-22 ENCOUNTER — Encounter (HOSPITAL_COMMUNITY): Payer: Self-pay | Admitting: Gastroenterology

## 2024-01-22 ENCOUNTER — Encounter (HOSPITAL_COMMUNITY)
Admission: RE | Disposition: A | Discharge status: Home / Self Care | Payer: Self-pay | Source: Home / Self Care | Attending: Gastroenterology

## 2024-01-22 DIAGNOSIS — D175 Benign lipomatous neoplasm of intra-abdominal organs: Secondary | ICD-10-CM | POA: Diagnosis not present

## 2024-01-22 DIAGNOSIS — D120 Benign neoplasm of cecum: Secondary | ICD-10-CM | POA: Diagnosis not present

## 2024-01-22 DIAGNOSIS — D123 Benign neoplasm of transverse colon: Secondary | ICD-10-CM | POA: Diagnosis not present

## 2024-01-22 DIAGNOSIS — K648 Other hemorrhoids: Secondary | ICD-10-CM | POA: Diagnosis not present

## 2024-01-22 DIAGNOSIS — D122 Benign neoplasm of ascending colon: Secondary | ICD-10-CM | POA: Insufficient documentation

## 2024-01-22 DIAGNOSIS — Z860100 Personal history of colon polyps, unspecified: Secondary | ICD-10-CM | POA: Diagnosis not present

## 2024-01-22 DIAGNOSIS — K52832 Lymphocytic colitis: Secondary | ICD-10-CM | POA: Insufficient documentation

## 2024-01-22 DIAGNOSIS — Z7901 Long term (current) use of anticoagulants: Secondary | ICD-10-CM | POA: Diagnosis not present

## 2024-01-22 DIAGNOSIS — K219 Gastro-esophageal reflux disease without esophagitis: Secondary | ICD-10-CM | POA: Diagnosis not present

## 2024-01-22 DIAGNOSIS — Z800 Family history of malignant neoplasm of digestive organs: Secondary | ICD-10-CM | POA: Diagnosis not present

## 2024-01-22 DIAGNOSIS — K635 Polyp of colon: Secondary | ICD-10-CM

## 2024-01-22 DIAGNOSIS — I10 Essential (primary) hypertension: Secondary | ICD-10-CM | POA: Insufficient documentation

## 2024-01-22 DIAGNOSIS — Z86718 Personal history of other venous thrombosis and embolism: Secondary | ICD-10-CM | POA: Diagnosis not present

## 2024-01-22 DIAGNOSIS — Z1211 Encounter for screening for malignant neoplasm of colon: Secondary | ICD-10-CM | POA: Diagnosis not present

## 2024-01-22 DIAGNOSIS — Z860101 Personal history of adenomatous and serrated colon polyps: Secondary | ICD-10-CM | POA: Diagnosis present

## 2024-01-22 DIAGNOSIS — K5730 Diverticulosis of large intestine without perforation or abscess without bleeding: Secondary | ICD-10-CM

## 2024-01-22 HISTORY — PX: COLONOSCOPY: SHX5424

## 2024-01-22 HISTORY — PX: POLYPECTOMY: SHX149

## 2024-01-22 MED ORDER — LIDOCAINE 2% (20 MG/ML) 5 ML SYRINGE
INTRAMUSCULAR | Status: DC | PRN
  Administered 2024-01-22: 80 mg via INTRAVENOUS

## 2024-01-22 MED ORDER — PROPOFOL 10 MG/ML IV BOLUS
INTRAVENOUS | Status: DC | PRN
  Administered 2024-01-22: 50 mg via INTRAVENOUS

## 2024-01-22 MED ORDER — PROPOFOL 500 MG/50ML IV EMUL
INTRAVENOUS | Status: DC | PRN
  Administered 2024-01-22: 150 ug/kg/min via INTRAVENOUS

## 2024-01-22 MED ORDER — LACTATED RINGERS IV SOLN: INTRAVENOUS | Status: DC

## 2024-01-22 NOTE — Op Note (Signed)
 [facility] Patient Name: Sylvia Barnett Procedure Date: 01/22/2024 12:02 PM MRN: [id number] Date of Birth: [birth year] Attending MD: Deatrice Dine , MD, [id number] CSN: [id number] Age: 79 Admit Type: Outpatient Procedure:                Colonoscopy Indications:              High risk colon cancer surveillance: Personal                            history of colonic polyps Providers:                Deatrice Dine, MD, Jon LABOR. Gerome RN, RN, Bascom Blush Referring MD:              Medicines:                Monitored Anesthesia Care Complications:            No immediate complications. Estimated Blood Loss:     Estimated blood loss was minimal. Procedure:                Pre-Anesthesia Assessment:                           - Prior to the procedure, a History and Physical                            was performed, and patient medications and                            allergies were reviewed. The patient's tolerance of                            previous anesthesia was also reviewed. The risks                            and benefits of the procedure and the sedation                            options and risks were discussed with the patient.                            All questions were answered, and informed consent                            was obtained. Prior Anticoagulants: The patient has                            taken Eliquis (apixaban), last dose was 2 days                            prior to procedure. ASA Grade Assessment: II - A  patient with mild systemic disease. After reviewing                            the risks and benefits, the patient was deemed in                            satisfactory condition to undergo the procedure.                           After obtaining informed consent, the colonoscope                            was passed under direct vision. Throughout the                            procedure,  the patient's blood pressure, pulse, and                            oxygen saturations were monitored continuously. The                            [id number]-[id number] ([id number]) Peds Colon was introduced                            through the anus and advanced to the the cecum,                            identified by appendiceal orifice and ileocecal                            valve. The ileocecal valve, appendiceal orifice,                            and rectum were photographed. Scope In: 12:23:35 PM Scope Out: 12:57:15 PM Scope Withdrawal Time: 0 hours 29 minutes 21 seconds  Total Procedure Duration: 0 hours 33 minutes 40 seconds  Findings:      The perianal and digital rectal examinations were normal.      Two sessile polyps were found in the cecum. The polyps were 4 to 5 mm in       size. These polyps were removed with a cold snare. Resection and       retrieval were complete.      Four sessile polyps were found in the ascending colon. The polyps were 5       to 12 mm in size. These polyps were removed with a cold snare. Resection       and retrieval were complete.      Four sessile polyps were found in the transverse colon. The polyps were       5 to 10 mm in size. These polyps were removed with a cold snare.       Resection and retrieval were complete.      There is no endoscopic evidence of inflammation in the entire colon.       Biopsies for histology were taken with a cold forceps for evaluation of  microscopic colitis.      Non-bleeding internal hemorrhoids were found during retroflexion. The       hemorrhoids were small. Impression:               - Two 4 to 5 mm polyps in the cecum, removed with a                            cold snare. Resected and retrieved.                           - Four 5 to 12 mm polyps in the ascending colon,                            removed with a cold snare. Resected and retrieved.                           - Four 5 to 10 mm polyps in the transverse  colon,                            removed with a cold snare. Resected and retrieved.                           - Non-bleeding internal hemorrhoids.                           -Lipomatous IC valve: biopsied Moderate Sedation:      Per Anesthesia Care Recommendation:           - Patient has a contact number available for                            emergencies. The signs and symptoms of potential                            delayed complications were discussed with the                            patient. Return to normal activities tomorrow.                            Written discharge instructions were provided to the                            patient.                           - Resume previous diet.                           - Continue present medications.                           - Await pathology results.                           - Repeat colonoscopy in  1 year for surveillance                            based on pathology results.                           - Return to GI clinic as previously scheduled.                           Note: right hip bruise was seen due to recent fall                            at home, before starting the procedure Procedure Code(s):        --- Professional ---                           [phone], Colonoscopy, flexible; with removal of                            tumor(s), polyp(s), or other lesion(s) by snare                            technique                           45380, 59, Colonoscopy, flexible; with biopsy,                            single or multiple Diagnosis Code(s):        --- Professional ---                           Z86.010, Personal history of colonic polyps                           D12.0, Benign neoplasm of cecum                           D12.2, Benign neoplasm of ascending colon                           D12.3, Benign neoplasm of transverse colon (hepatic                            flexure or splenic flexure)                            K64.8, Other hemorrhoids CPT copyright 2022 American [employer] Association. All rights reserved. The codes documented in this report are preliminary and upon coder review may  be revised to meet current compliance requirements. Deatrice Dine, MD Deatrice Dine, MD 01/22/2024 1:05:00 PM This report has been signed electronically. Number of Addenda: 0

## 2024-01-22 NOTE — Anesthesia Procedure Notes (Signed)
 Date/Time: 01/22/2024 12:17 PM  Performed by: Para Jerelene CROME, CRNAOxygen Delivery Method: Nasal cannula

## 2024-01-22 NOTE — Discharge Instructions (Signed)

## 2024-01-22 NOTE — Transfer of Care (Addendum)
 Immediate Anesthesia Transfer of Care Note  Patient: Sylvia Barnett  Procedure(s) Performed: COLONOSCOPY POLYPECTOMY, INTESTINE  Patient Location: Endoscopy Unit  Anesthesia Type:MAC  Level of Consciousness: drowsy and patient cooperative  Airway & Oxygen Therapy: Patient Spontanous Breathing  Post-op Assessment: Report given to RN and Post -op Vital signs reviewed and stable  Post vital signs: Reviewed and stable  Last Vitals:  Vitals Value Taken Time  BP 169/41 01/22/24   1301  Temp 37.1 01/22/24   1301  Pulse 87 01/22/24   1301  Resp 17 01/22/24   1301  SpO2 98% 01/22/24   1301    Last Pain:  Vitals:   01/22/24 1217  TempSrc:   PainSc: 0-No pain         Complications: No notable events documented.

## 2024-01-22 NOTE — H&P (Signed)
 Primary Care Physician:  Verneda Charmaine SAUNDERS, FNP Primary Gastroenterologist:  Dr. Cinderella  Pre-Procedure History & Physical: HPI: Sylvia Barnett is a 79 y.o. female with history of history of lymphocytic colitis , IBS , history of breast cancer, DVT on Eliquis who presents for evaluation of Fecal incontinence and diarrhea   Patient was last seen by Dr. Golda in 2021.  Patient was using Pepto-Bismol as needed and Imodium  and/dicyclomine  for management of lymphocytic colitis and IBS-D at that time   Patient reports she has diarrhea for a while at least for the past 5 years but recently she started having bowel incontinence.  She would have 3-6 bowel movements daily especially if she does not take Imodium  The patient denies having any nausea, vomiting, fever, chills, hematochezia, melena, hematemesis, abdominal distention, abdominal pain,  jaundice, pruritus or weight loss.   Last ZHI:wnwz Last Colonoscopy:2020 - Two 5 to 6 mm polyps in the transverse colon and at the hepatic flexure, removed with a cold snare. Resected and retrieved. - Diverticulosis in the sigmoid colon.   Her last colonoscopy was in December 2020. She had 2 small tubular adenomas removed.    FH: Family history is significant for CRC in sister who had APR in her late 32s and died 10 years later at age 26 of lymphoma.     Past Medical History:  Diagnosis Date   Anxiety    Since in her 20's   Arthritis 2013   Right hip   Cancer [facility]) 2004    Breast Lumpectomy   Complication of anesthesia    Has a small throat   Depression    Started when she was in her 20's   Hypercholesteremia 2012   Hypertension 2013   IBS (irritable bowel syndrome) 2018   per GI note   Incontinence-urinary 2012   Lobular carcinoma in situ (LCIS) of left breast 09/08/2008   Melanoma of back [facility]) 1994   Excision with clear margins   Microscopic colitis 2015   Resolved 2016    Past Surgical History:  Procedure Laterality Date   ABDOMINAL  HYSTERECTOMY  2002   APPENDECTOMY  1949   BIOPSY  02/26/2018   Procedure: BIOPSY;  Surgeon: Golda Claudis PENNER, MD;  Location: [facility];  Service: Endoscopy;;  gastric   BIOPSY BREAST  2010   Left   Bladder Tack  2002   BREAST LUMPECTOMY  2004   Left   BREAST LUMPECTOMY  2004   Left   BREAST LUMPECTOMY  2006   Left   BREAST LUMPECTOMY  2000   Left   BREAST LUMPECTOMY WITH RADIOACTIVE SEED AND SENTINEL LYMPH NODE BIOPSY Left 09/11/2017   Procedure: LEFT BREAST LUMPECTOMY X2 WITH RADIOACTIVE SEED AND LEFT AXILLARY SENTINEL LYMPH NODE BIOPSY;  Surgeon: Mikell Katz, MD;  Location: [facility] OR;  Service: General;  Laterality: Left;   BUNIONECTOMY  1994   Right   CATARACT EXTRACTION W/PHACO Left 08/18/2013   Procedure: CATARACT EXTRACTION PHACO AND INTRAOCULAR LENS PLACEMENT (IOC);  Surgeon: Cherene Mania, MD;  Location: [facility] ORS;  Service: Ophthalmology;  Laterality: Left;  CDE: 8.62   COLONOSCOPY N/A 11/10/2013   Procedure: COLONOSCOPY;  Surgeon: Claudis PENNER Golda, MD;  Location: [facility];  Service: Endoscopy;  Laterality: N/A;  240   COLONOSCOPY N/A 12/30/2018   Procedure: COLONOSCOPY;  Surgeon: Golda Claudis PENNER, MD;  Location: [facility];  Service: Endoscopy;  Laterality: N/A;  12   ESOPHAGOGASTRODUODENOSCOPY N/A 02/26/2018   Procedure: ESOPHAGOGASTRODUODENOSCOPY (EGD);  Surgeon: Golda Claudis PENNER, MD;  [facility]: [facility];  Service: Endoscopy;  Laterality: N/A;  1055   EYE SURGERY  2011   Catarct Right   EYE SURGERY  08-2011   Right Yag procedure   JOINT REPLACEMENT  08-2010   Left total knee   KNEE ARTHROSCOPY  2010   Right   KNEE ARTHROSCOPY  2003   Left   POLYPECTOMY  12/30/2018   Procedure: POLYPECTOMY;  Surgeon: Golda Claudis PENNER, MD;  Location: [facility];  Service: Endoscopy;;  hepatic flexure,transverse colon     PORTACATH PLACEMENT N/A 09/11/2017   Procedure: INSERTION PORT-A-CATH;  Surgeon: Mikell Katz, MD;  Location: [facility] OR;  Service: General;  Laterality:  N/A;   TOTAL KNEE ARTHROPLASTY  11/03/2011   Procedure: TOTAL KNEE ARTHROPLASTY;  Surgeon: Dempsey LULLA Moan, MD;  Location: [facility] ORS;  Service: Orthopedics;  Laterality: Right;   TUBAL LIGATION     WRIST SURGERY Bilateral 03-2010   WRIST SURGERY  2008   Right    Prior to Admission medications  Medication Sig Start Date End Date Taking? Authorizing Provider  atorvastatin  (LIPITOR) 20 MG tablet Take 20 mg by mouth daily. 12/08/22  Yes [provider]  cholestyramine  (QUESTRAN ) 4 g packet Take 1 packet (4 g total) by mouth daily. Mix with 4-6 oz liquid.  Take other meds 1 hr before or 4-6 hr after cholestyramine . 12/15/23 06/12/24 Yes Shloime Keilman, Deatrice FALCON, MD  escitalopram (LEXAPRO) 5 MG tablet Take 5 mg by mouth at bedtime.    Yes [provider]  hydrochlorothiazide  (HYDRODIURIL ) 25 MG tablet Take 25 mg by mouth daily.   Yes [provider]  hydroxypropyl methylcellulose / hypromellose (ISOPTO TEARS / GONIOVISC) 2.5 % ophthalmic solution Place 1 drop into both eyes 3 (three) times daily as needed for dry eyes.   Yes [provider]  Loperamide  HCl (IMODIUM  PO) Take 2 mg by mouth daily.   Yes [provider]  Menthol , Topical Analgesic, 5 % PADS Apply 1 patch topically daily as needed (pain).   Yes [provider]  nystatin  (MYCOSTATIN /NYSTOP ) powder Apply 1 Application topically 3 (three) times daily. 08/25/23  Yes Gudena, Vinay, MD  psyllium (METAMUCIL) 58.6 % packet Take 1 packet by mouth 2 (two) times daily. 12/15/23 03/14/24 Yes Tavion Senkbeil F, MD  tiZANidine (ZANAFLEX) 4 MG tablet Take 2 mg by mouth 3 (three) times daily as needed. 04/06/23  Yes [provider]  traMADol  (ULTRAM ) 50 MG tablet Take 1 tablet (50 mg total) by mouth every 6 (six) hours as needed. 04/10/23  Yes Suzette Pac, MD  ELIQUIS 5 MG TABS tablet Take 5 mg by mouth 2 (two) times daily. 05/21/23   [provider]  prochlorperazine  (COMPAZINE ) 10 MG tablet Take 1  tablet (10 mg total) by mouth every 6 (six) hours as needed (Nausea or vomiting). 10/15/17 01/18/18  Odean Potts, MD    Allergies as of 01/12/2024 - Review Complete 12/15/2023  Allergen Reaction Noted   Fiorinal-codeine #3 [butalbital-asa-caff-codeine] Rash 12/05/2014   Lansoprazole Swelling 06/17/2013   Latex Swelling, Other (See Comments), Itching, and Rash 10/23/2011   Butalbital-aspirin-caffeine Rash 06/17/2013   Sulfa antibiotics Rash 10/23/2011   Doxycycline  Diarrhea 12/09/2021   Oxycodone -acetaminophen  Other (See Comments) 08/19/2021   Femara  [letrozole ] Rash 10/15/2017   Sulfasalazine Rash 10/23/2011    Family History  Problem Relation Age of Onset   Breast cancer Sister 82   Lymphoma Maternal Aunt     Social History  Socioeconomic History   Marital status: Married    Spouse name: Not on file   Number of children: Not on file   Years of education: Not on file   Highest education level: Not on file  Occupational History   Not on file  Tobacco Use   Smoking status: Never   Smokeless tobacco: Never  Vaping Use   Vaping status: Never Used  Substance and Sexual Activity   Alcohol  use: No    Alcohol /week: 0.0 standard drinks of alcohol    Drug use: No   Sexual activity: Yes    Birth control/protection: Surgical  Other Topics Concern   Not on file  Social History Narrative   Not on file   Social Drivers of Health   Tobacco Use: Low Risk (01/22/2024)   Patient History    Smoking Tobacco Use: Never    Smokeless Tobacco Use: Never    Passive Exposure: Not on file  Financial Resource Strain: Low Risk (05/21/2023)   Received from [facility]   Overall Financial Resource Strain (CARDIA)    Difficulty of Paying Living Expenses: Not hard at all  Food Insecurity: No Food Insecurity (05/21/2023)   Received from [facility]   Epic    Within the past 12 months, you worried that your food would run out before you got the money to buy more.: Never true    Within the  past 12 months, the food you bought just didn't last and you didn't have money to get more.: Never true  Transportation Needs: No Transportation Needs (05/21/2023)   Received from [facility]   PRAPARE - Transportation    Lack of Transportation (Medical): No    Lack of Transportation (Non-Medical): No  Physical Activity: Inactive (05/21/2023)   Received from [facility]   Exercise Vital Sign    On average, how many days per week do you engage in moderate to strenuous exercise (like a brisk walk)?: 0 days    On average, how many minutes do you engage in exercise at this level?: 0 min  Stress: No Stress Concern Present (05/21/2023)   Received from [facility] - Occupational Stress Questionnaire    Feeling of Stress : Only a little  Social Connections: Socially Integrated (05/21/2023)   Received from [facility]   Social Connection and Isolation Panel    In a typical week, how many times do you talk on the phone with family, friends, or neighbors?: More than three times a week    How often do you get together with friends or relatives?: Three times a week    How often do you attend church or religious services?: More than 4 times per year    Do you belong to any clubs or organizations such as church groups, unions, fraternal or athletic groups, or school groups?: Yes    How often do you attend meetings of the clubs or organizations you belong to?: More than 4 times per year    Are you married, widowed, divorced, separated, never married, or living with a partner?: Married  Intimate Partner Violence: Not At Risk (05/21/2023)   Received from [facility]   Epic    Within the last year, have you been afraid of your partner or ex-partner?: No    Within the last year, have you been humiliated or emotionally abused in other ways by your partner or ex-partner?: No    Within  the last year, have you been kicked, hit, slapped, or otherwise  physically hurt by your partner or ex-partner?: No    Within the last year, have you been raped or forced to have any kind of sexual activity by your partner or ex-partner?: No  Depression (PHQ2-9): Not on file  Alcohol  Screen: Not on file  Housing: Patient Declined (09/24/2022)   Housing    Last Housing Risk Score: 98  Utilities: Low Risk (05/21/2023)   Received from [facility]   Utilities    Within the past 12 months, have you been unable to get utilities(heat, electricity) when it was really needed?: No  Health Literacy: Medium Risk (05/21/2023)   Received from [facility] Literacy    How often do you need to have someone help you when you read instructions, pamphlets, or other written material from your doctor or pharmacy?: Rarely    Review of Systems: See HPI, otherwise negative ROS  Physical Exam: Vital signs in last 24 hours: Temp:  [98.5 F (36.9 C)] 98.5 F (36.9 C) (01/09 1044) Pulse Rate:  [97] 97 (01/09 1044) Resp:  [19] 19 (01/09 1044) BP: (175)/(80) 175/80 (01/09 1044) SpO2:  [93 %] 93 % (01/09 1044) Weight:  [83.9 kg] 83.9 kg (01/09 1044)   General:   Alert,  Well-developed, well-nourished, pleasant and cooperative in NAD Head:  Normocephalic and atraumatic. Eyes:  Sclera clear, no icterus.   Conjunctiva pink. Ears:  Normal auditory acuity. Nose:  No deformity, discharge,  or lesions. Msk:  Symmetrical without gross deformities. Normal posture. Extremities:  Without clubbing or edema. Neurologic:  Alert and  oriented x4;  grossly normal neurologically. Skin:  Intact without significant lesions or rashes. Psych:  Alert and cooperative. Normal mood and affect.  Impression/Plan: Sylvia Barnett is a 79 y.o. female with history of history of lymphocytic colitis , IBS , history of breast cancer, DVT on Eliquis who presents for evaluation of Fecal incontinence and diarrhea  Proceed with colonoscopy   The risks of the procedure including  infection, bleed, or perforation as well as benefits, limitations, alternatives and imponderables have been reviewed with the patient. Questions have been answered. All parties agreeable.

## 2024-01-22 NOTE — Anesthesia Preprocedure Evaluation (Addendum)
"                                    Anesthesia Evaluation  Patient identified by MRN, date of birth, ID band Patient awake    Reviewed: Allergy & Precautions, H&P , NPO status , Patient's Chart, lab work & pertinent test results, reviewed documented beta blocker date and time   History of Anesthesia Complications (+) history of anesthetic complications  Airway Mallampati: II  TM Distance: >3 FB Neck ROM: full    Dental no notable dental hx.    Pulmonary neg pulmonary ROS   Pulmonary exam normal breath sounds clear to auscultation       Cardiovascular Exercise Tolerance: Good hypertension,  Rhythm:regular Rate:Normal     Neuro/Psych  PSYCHIATRIC DISORDERS Anxiety Depression    negative neurological ROS     GI/Hepatic Neg liver ROS,GERD  ,,  Endo/Other  negative endocrine ROS    Renal/GU negative Renal ROS  negative genitourinary   Musculoskeletal   Abdominal   Peds  Hematology negative hematology ROS (+)   Anesthesia Other Findings   Reproductive/Obstetrics negative OB ROS                              Anesthesia Physical Anesthesia Plan  ASA: 2  Anesthesia Plan: MAC   Post-op Pain Management:    Induction:   PONV Risk Score and Plan: Propofol  infusion  Airway Management Planned:   Additional Equipment:   Intra-op Plan:   Post-operative Plan:   Informed Consent: I have reviewed the patients History and Physical, chart, labs and discussed the procedure including the risks, benefits and alternatives for the proposed anesthesia with the patient or authorized representative who has indicated his/her understanding and acceptance.     Dental Advisory Given  Plan Discussed with: CRNA  Anesthesia Plan Comments:          Anesthesia Quick Evaluation  "

## 2024-01-23 NOTE — Anesthesia Postprocedure Evaluation (Signed)
"   Anesthesia Post Note  Patient: Sylvia Barnett  Procedure(s) Performed: COLONOSCOPY POLYPECTOMY, INTESTINE  Patient location during evaluation: Phase II Anesthesia Type: MAC Level of consciousness: awake Pain management: pain level controlled Vital Signs Assessment: post-procedure vital signs reviewed and stable Respiratory status: spontaneous breathing and respiratory function stable Cardiovascular status: blood pressure returned to baseline and stable Postop Assessment: no headache and no apparent nausea or vomiting Anesthetic complications: no Comments: Late entry   No notable events documented.   Last Vitals:  Vitals:   01/22/24 1301 01/22/24 1309  BP: (!) 169/41 (!) 157/62  Pulse: 87 88  Resp: 17 16  Temp: 37.1 C   SpO2: 98% 98%    Last Pain:  Vitals:   01/22/24 1309  TempSrc:   PainSc: 0-No pain                 Yvonna PARAS Makari Sanko      "

## 2024-01-25 ENCOUNTER — Encounter (HOSPITAL_COMMUNITY): Payer: Self-pay | Admitting: Gastroenterology

## 2024-01-25 LAB — SURGICAL PATHOLOGY

## 2024-01-29 ENCOUNTER — Telehealth (INDEPENDENT_AMBULATORY_CARE_PROVIDER_SITE_OTHER): Payer: Self-pay | Admitting: Gastroenterology

## 2024-01-29 NOTE — Telephone Encounter (Signed)
 Pt contacted and verbalized understanding.  Dr. Cinderella please advise. Thank you!

## 2024-01-29 NOTE — Telephone Encounter (Signed)
 Pt called into office and states she had a colonoscopy on 01/22/24 and is still having diarrhea. Having mucous with diarrhea and having accidents at times. Taking metamucil BID;began to have accidents when taking Metamucil BID so she went back to once per day. Per provider she was told to go back to twice a day. Pt would like recommendations prior to the weekend. Please advise. Thank you!

## 2024-02-01 ENCOUNTER — Ambulatory Visit (INDEPENDENT_AMBULATORY_CARE_PROVIDER_SITE_OTHER): Payer: Self-pay | Admitting: Gastroenterology

## 2024-02-01 NOTE — Progress Notes (Signed)
 1 yr TCS noted in recall Patient result letter mailed procedure note and pathology result faxed to PCP

## 2024-02-03 ENCOUNTER — Ambulatory Visit (INDEPENDENT_AMBULATORY_CARE_PROVIDER_SITE_OTHER): Admitting: Gastroenterology

## 2024-02-03 ENCOUNTER — Encounter (INDEPENDENT_AMBULATORY_CARE_PROVIDER_SITE_OTHER): Payer: Self-pay | Admitting: Gastroenterology

## 2024-02-03 VITALS — BP 136/79 | HR 80 | Temp 97.6°F | Ht 63.0 in | Wt 187.0 lb

## 2024-02-03 DIAGNOSIS — K52832 Lymphocytic colitis: Secondary | ICD-10-CM

## 2024-02-03 DIAGNOSIS — K529 Noninfective gastroenteritis and colitis, unspecified: Secondary | ICD-10-CM | POA: Diagnosis not present

## 2024-02-03 DIAGNOSIS — K580 Irritable bowel syndrome with diarrhea: Secondary | ICD-10-CM

## 2024-02-03 DIAGNOSIS — R197 Diarrhea, unspecified: Secondary | ICD-10-CM | POA: Insufficient documentation

## 2024-02-03 DIAGNOSIS — Z860101 Personal history of adenomatous and serrated colon polyps: Secondary | ICD-10-CM

## 2024-02-03 DIAGNOSIS — Z800 Family history of malignant neoplasm of digestive organs: Secondary | ICD-10-CM

## 2024-02-03 MED ORDER — DICYCLOMINE HCL 20 MG PO TABS: 10.0000 mg | ORAL_TABLET | Freq: Three times a day (TID) | ORAL | 3 refills | Status: AC

## 2024-02-03 NOTE — Patient Instructions (Addendum)
 It was very nice to meet you today, as dicussed with will plan for the following :  1) Take bentyl  , imodium  and Peptobismol/Tums as needed

## 2024-02-03 NOTE — Progress Notes (Signed)
 Sylvia Barnett [name] , M.D. Gastroenterology & Hepatology [facility] Gastroenterology [address] [place], [place] [address] Primary Care Physician: Verneda Charmaine SAUNDERS, FNP [address] [place] [address]  Chief Complaint: Diarrhea  History of Present Illness: Sylvia Barnett is a 79 y.o. female with history of history of lymphocytic colitis , IBS , history of breast cancer, DVT on Eliquis who presents for evaluation of chronic diarrhea  Patient reports after colonoscopy she continues to have diarrhea close fecal incontinence resolved.  Since colonoscopy she has taking Imodium  twice so far.  She is having 2-3 bowel movements daily the more she had was 5 BMs.  Overall she feels things have improved Was not able to tolerate cholestyramine  or Metamucil.  Patient previously was using Pepto-Bismol as needed and Imodium  and/dicyclomine  for management of lymphocytic colitis and IBS-D at that time   Last ZHI:wnwz Last Colonoscopy:01/2024  - Two 4 to 5 mm polyps in the cecum, removed with a cold snare. Resected and retrieved. - Four 5 to 12 mm polyps in the ascending colon, removed with a cold snare. Resected and retrieved. - Four 5 to 10 mm polyps in the transverse colon, removed with a cold snare. Resected and retrieved. - Non- bleeding internal hemorrhoids. - Lipomatous IC valve: biopsied   10 Ta's removed  A. ILEOCECAL VALVE, BIOPSY:  Benign ileocolonic junctional type mucosa with no diagnostic abnormality   B. CECUM, ASCENDING AND TRANSVERSE COLON, POLYPECTOMY:  Tubular adenoma, 7 fragments  Negative for high-grade dysplasia and carcinoma   C. COLON, RANDOM, BIOPSY:  Benign colonic mucosa with no diagnostic abnormality    2020 - Two 5 to 6 mm polyps in the transverse colon and at the hepatic flexure, removed with a cold snare. Resected and retrieved. - Diverticulosis in the sigmoid colon.   Her last colonoscopy was in December 2020.  She had 2 small tubular adenomas removed.    FH: Family history is significant for CRC in sister who had APR in her late 38s and died 10 years later at age 30 of lymphoma  Past Medical History: Past Medical History:  Diagnosis Date   Anxiety    Since in her 72's   Arthritis 2013   Right hip   Cancer (HCC) 2004    Breast Lumpectomy   Complication of anesthesia    Has a small throat   Depression    Started when she was in her 14's   Hypercholesteremia 2012   Hypertension 2013   IBS (irritable bowel syndrome) 2018   per GI note   Incontinence-urinary 2012   Lobular carcinoma in situ (LCIS) of left breast 09/08/2008   Melanoma of back (HCC) 1994   Excision with clear margins   Microscopic colitis 2015   Resolved 2016    Past Surgical History: Past Surgical History:  Procedure Laterality Date   ABDOMINAL HYSTERECTOMY  2002   APPENDECTOMY  1949   BIOPSY  02/26/2018   Procedure: BIOPSY;  Surgeon: Golda Claudis PENNER, MD;  [facility];  Service: Endoscopy;;  gastric   BIOPSY BREAST  2010   Left   Bladder Tack  2002   BREAST LUMPECTOMY  2004   Left   BREAST LUMPECTOMY  2004   Left   BREAST LUMPECTOMY  2006   Left   BREAST LUMPECTOMY  2000   Left   BREAST LUMPECTOMY WITH RADIOACTIVE SEED AND SENTINEL LYMPH NODE BIOPSY Left 09/11/2017   Procedure: LEFT  BREAST LUMPECTOMY X2 WITH RADIOACTIVE SEED AND LEFT AXILLARY SENTINEL LYMPH NODE BIOPSY;  Surgeon: Mikell Katz, MD;  Location: [facility];  Service: General;  Laterality: Left;   BUNIONECTOMY  1994   Right   CATARACT EXTRACTION W/PHACO Left 08/18/2013   Procedure: CATARACT EXTRACTION PHACO AND INTRAOCULAR LENS PLACEMENT (IOC);  Surgeon: Cherene Mania, MD;  Location: [facility];  Service: Ophthalmology;  Laterality: Left;  CDE: 8.62   COLONOSCOPY N/A 11/10/2013   Procedure: COLONOSCOPY;  Surgeon: Claudis RAYMOND Rivet, MD;  [facility]: [facility];  Service: Endoscopy;  Laterality: N/A;  240   COLONOSCOPY N/A 12/30/2018    Procedure: COLONOSCOPY;  Surgeon: Rivet Claudis RAYMOND, MD;  [facility];  Service: Endoscopy;  Laterality: N/A;  12   COLONOSCOPY N/A 01/22/2024   Procedure: COLONOSCOPY;  Surgeon: Cinderella Deatrice FALCON, MD;  [facility]: [facility];  Service: Endoscopy;  Laterality: N/A;  11:45am, ASA 1-2   ESOPHAGOGASTRODUODENOSCOPY N/A 02/26/2018   Procedure: ESOPHAGOGASTRODUODENOSCOPY (EGD);  Surgeon: Rivet Claudis RAYMOND, MD;  [facility];  Service: Endoscopy;  Laterality: N/A;  1055   EYE SURGERY  2011   Catarct Right   EYE SURGERY  08-2011   Right Yag procedure   JOINT REPLACEMENT  08-2010   Left total knee   KNEE ARTHROSCOPY  2010   Right   KNEE ARTHROSCOPY  2003   Left   POLYPECTOMY  12/30/2018   Procedure: POLYPECTOMY;  Surgeon: Rivet Claudis RAYMOND, MD;  [facility];  Service: Endoscopy;;  hepatic flexure,transverse colon     POLYPECTOMY  01/22/2024   Procedure: POLYPECTOMY, INTESTINE;  Surgeon: Cinderella Deatrice FALCON, MD;  [facility];  Service: Endoscopy;;   PORTACATH PLACEMENT N/A 09/11/2017   Procedure: INSERTION PORT-A-CATH;  Surgeon: Mikell Katz, MD;  Location: [facility];  Service: General;  Laterality: N/A;   TOTAL KNEE ARTHROPLASTY  11/03/2011   Procedure: TOTAL KNEE ARTHROPLASTY;  Surgeon: Dempsey LULLA Moan, MD;  Location: [facility];  Service: Orthopedics;  Laterality: Right;   TUBAL LIGATION     WRIST SURGERY Bilateral 03-2010   WRIST SURGERY  2008   Right    Family History: Family History  Problem Relation Age of Onset   Breast cancer Sister 20   Lymphoma Maternal Aunt     Social History:Tobacco Use History[1] Social History   Substance and Sexual Activity  Alcohol  Use No   Alcohol /week: 0.0 standard drinks of alcohol    Social History   Substance and Sexual Activity  Drug Use No    Allergies: Allergies[2]  Medications: Current Outpatient Medications  Medication Sig Dispense Refill   atorvastatin  (LIPITOR) 20 MG tablet Take 20 mg by mouth  daily. (Patient taking differently: Take 10 mg by mouth daily.)     ELIQUIS 5 MG TABS tablet Take 5 mg by mouth 2 (two) times daily.     escitalopram (LEXAPRO) 5 MG tablet Take 5 mg by mouth at bedtime.      hydrochlorothiazide  (HYDRODIURIL ) 25 MG tablet Take 25 mg by mouth daily.     hydroxypropyl methylcellulose / hypromellose (ISOPTO TEARS / GONIOVISC) 2.5 % ophthalmic solution Place 1 drop into both eyes 3 (three) times daily as needed for dry eyes.     Loperamide  HCl (IMODIUM  PO) Take 2 mg by mouth daily.     Menthol , Topical Analgesic, 5 % PADS Apply 1 patch topically daily as needed (pain).     nystatin  (MYCOSTATIN /NYSTOP ) powder Apply 1 Application topically 3 (three) times daily. 30  g 1   psyllium (METAMUCIL) 58.6 % packet Take 1 packet by mouth 2 (two) times daily. 60 packet 2   tiZANidine (ZANAFLEX) 4 MG tablet Take 2 mg by mouth 3 (three) times daily as needed.     No current facility-administered medications for this visit.    Review of Systems: GENERAL: negative for malaise, night sweats HEENT: No changes in hearing or vision, no nose bleeds or other nasal problems. NECK: Negative for lumps, goiter, pain and significant neck swelling RESPIRATORY: Negative for cough, wheezing CARDIOVASCULAR: Negative for chest pain, leg swelling, palpitations, orthopnea GI: SEE HPI MUSCULOSKELETAL: Negative for joint pain or swelling, back pain, and muscle pain. SKIN: Negative for lesions, rash HEMATOLOGY Negative for prolonged bleeding, bruising easily, and swollen nodes. ENDOCRINE: Negative for cold or heat intolerance, polyuria, polydipsia and goiter. NEURO: negative for tremor, gait imbalance, syncope and seizures. The remainder of the review of systems is noncontributory.   Physical Exam: There were no vitals taken for this visit. GENERAL: The patient is AO x3, in no acute distress. HEENT: Head is normocephalic and atraumatic. EOMI are intact. Mouth is well hydrated and without  lesions. NECK: Supple. No masses LUNGS: Clear to auscultation. No presence of rhonchi/wheezing/rales. Adequate chest expansion HEART: RRR, normal s1 and s2. ABDOMEN: Soft, nontender, no guarding, no peritoneal signs, and nondistended. BS +. No masses.  Imaging/Labs: as above     Latest Ref Rng & Units 10/25/2018    8:55 AM 09/06/2018    8:19 AM 07/26/2018    9:10 AM  CBC  WBC 4.0 - 10.5 K/uL 5.8  4.7  6.2   Hemoglobin 12.0 - 15.0 g/dL 86.6  87.0  87.1   Hematocrit 36.0 - 46.0 % 39.3  39.0  38.8   Platelets 150 - 400 K/uL 155  167  166    No results found for: IRON, TIBC, FERRITIN  I personally reviewed and interpreted the available labs, imaging and endoscopic files.  Impression and Plan:  LAVEDA DEMEDEIROS is a 79 y.o. female with history of history of lymphocytic colitis , IBS , history of breast cancer, DVT on Eliquis who presents for evaluation of chronic diarrhea  #Chronic Diarrhea #Colon polyps   Recent colonoscopy with 10 diminutive tubular adenoma removed.  Next colonoscopy in 1 year  Recent random colon biopsies negative for active lymphocytic colitis  Was unable to tolerate Metamucil or cholestyramine   Patient previously was using Pepto-Bismol as needed and Imodium  and/dicyclomine  for management of lymphocytic colitis and IBS-D at that time   Will restart Imodium , Pepto-Bismol and dicyclomine  as needed  All questions were answered.      Kentrell Guettler Barnett Britton Perkinson, MD Gastroenterology and Hepatology [facility] Gastroenterology   This chart has been completed using [facility] Dictation software, and while attempts have been made to ensure accuracy , certain words and phrases may not be transcribed as intended      [1]  Social History Tobacco Use  Smoking Status Never  Smokeless Tobacco Never  [2]  Allergies Allergen Reactions   Fiorinal-Codeine #3 [Butalbital-Asa-Caff-Codeine] Rash   Lansoprazole Swelling   Latex Swelling, Other  (See Comments), Itching and Rash    blisters   Butalbital-Aspirin-Caffeine Rash   Sulfa Antibiotics Rash   Doxycycline  Diarrhea   Oxycodone -Acetaminophen  Other (See Comments)   Femara  [Letrozole ] Rash    Rash when tried in 1984   Sulfasalazine Rash

## 2024-02-09 ENCOUNTER — Telehealth (INDEPENDENT_AMBULATORY_CARE_PROVIDER_SITE_OTHER): Payer: Self-pay

## 2024-02-09 ENCOUNTER — Encounter (INDEPENDENT_AMBULATORY_CARE_PROVIDER_SITE_OTHER): Payer: Self-pay

## 2024-02-09 NOTE — Telephone Encounter (Signed)
 Patient called to ask us  to place in her chart in her family history that two of her sisters had colon cancer. I have added this to the patient's family history.

## 2024-02-09 NOTE — Telephone Encounter (Signed)
 Thanks, I had that in my clinic note and explains the 10 polyps recently found

## 2024-02-12 ENCOUNTER — Telehealth: Payer: Self-pay | Admitting: Gastroenterology

## 2024-02-12 ENCOUNTER — Other Ambulatory Visit: Payer: Self-pay | Admitting: Gastroenterology

## 2024-02-12 DIAGNOSIS — R197 Diarrhea, unspecified: Secondary | ICD-10-CM

## 2024-02-12 DIAGNOSIS — A09 Infectious gastroenteritis and colitis, unspecified: Secondary | ICD-10-CM

## 2024-02-12 DIAGNOSIS — K580 Irritable bowel syndrome with diarrhea: Secondary | ICD-10-CM

## 2024-02-12 NOTE — Telephone Encounter (Signed)
 Patient called after hours line reporting concern about ongoing diarrhea with urgency and bloating/gassiness.   Has been taking pepto and dicyclomine  and 2-4 mg imodium  daily and still having some urgency and diarrhea reported as moderate amount. Has already been 3 times today. She did report recent UTI with antibiotic use as well as a cold prior to that. Reports recent testing negative for the flu, RSV, and COVID.   Given she is having bloating and some of her diarrhea is post prandial I will also check stools for elastase to assess for EPI.   Given recent antibiotic use will need to rule out Cdiff given no improvement of baseline diarrhea symptoms with the above regimen. Also ordered GI profile. She is aware orders were electronically sent to [facility] and recommended for her to pick up supplies and return stool sample as soon as she is able.   Thus far I have advised to continue bland diet and to take 2-4 mg of imodium  before breakfast and take an additional 2 mg before lunch and supper as needed and If still having diarrhea can take an additional imodium  before bed. Hold in the setting of possible constipation If needed can continue pepto.   If no improvement with regimen above and/or stool studies are normal/negative then will trial course of Xifaxin (I have confirmed all of these recommendation with Dr. Cinderella).   Charmaine Melia, MSN, APRN, FNP-BC, AGACNP-BC [facility] Gastroenterology at [facility]

## 2024-02-16 NOTE — Telephone Encounter (Signed)
 Noted.

## 2024-02-17 ENCOUNTER — Ambulatory Visit: Payer: Self-pay | Admitting: Gastroenterology

## 2024-02-17 LAB — CLOSTRIDIUM DIFFICILE EIA: C difficile Toxins A+B, EIA: NEGATIVE

## 2024-02-18 ENCOUNTER — Telehealth (INDEPENDENT_AMBULATORY_CARE_PROVIDER_SITE_OTHER): Payer: Self-pay | Admitting: Gastroenterology

## 2024-02-18 LAB — GI PROFILE, STOOL, PCR

## 2024-02-18 LAB — PANCREATIC ELASTASE, FECAL: Pancreatic Elastase, Fecal: 661 ug Elast./g

## 2024-02-18 NOTE — Telephone Encounter (Signed)
 Pt left voicemail stating that she is having stomach issues and had labs at [facility]  Returned call to pt to let her know that Cdiff and GI profile were negative and the we are waiting on fecal elastase; left detailed message on answering machine (OK per DPR)

## 2024-03-30 ENCOUNTER — Telehealth: Admitting: Hematology and Oncology
